# Patient Record
Sex: Male | Born: 1955
Health system: Southern US, Community
[De-identification: ages and names within clinical notes are randomized; demographics above are authoritative.]

## PROBLEM LIST (undated history)

## (undated) DIAGNOSIS — F329 Major depressive disorder, single episode, unspecified: Secondary | ICD-10-CM

## (undated) DIAGNOSIS — Z5189 Encounter for other specified aftercare: Secondary | ICD-10-CM

## (undated) DIAGNOSIS — D649 Anemia, unspecified: Secondary | ICD-10-CM

## (undated) DIAGNOSIS — E785 Hyperlipidemia, unspecified: Secondary | ICD-10-CM

## (undated) DIAGNOSIS — K219 Gastro-esophageal reflux disease without esophagitis: Secondary | ICD-10-CM

## (undated) DIAGNOSIS — I700 Atherosclerosis of aorta: Secondary | ICD-10-CM

## (undated) DIAGNOSIS — F32A Depression, unspecified: Secondary | ICD-10-CM

## (undated) DIAGNOSIS — Z789 Other specified health status: Secondary | ICD-10-CM

## (undated) DIAGNOSIS — F419 Anxiety disorder, unspecified: Secondary | ICD-10-CM

## (undated) DIAGNOSIS — J449 Chronic obstructive pulmonary disease, unspecified: Secondary | ICD-10-CM

## (undated) HISTORY — DX: Depression, unspecified: F32.A

## (undated) HISTORY — DX: Anxiety disorder, unspecified: F41.9

## (undated) HISTORY — DX: Major depressive disorder, single episode, unspecified: F32.9

## (undated) HISTORY — DX: Atherosclerosis of aorta: I70.0

## (undated) HISTORY — DX: Encounter for other specified aftercare: Z51.89

## (undated) HISTORY — DX: Anemia, unspecified: D64.9

## (undated) HISTORY — PX: LEG SURGERY: SHX1003

---

## 1998-08-23 ENCOUNTER — Encounter: Payer: Self-pay | Admitting: Emergency Medicine

## 1998-08-23 ENCOUNTER — Emergency Department (HOSPITAL_COMMUNITY): Admission: EM | Admit: 1998-08-23 | Discharge: 1998-08-23 | Payer: Self-pay | Admitting: Emergency Medicine

## 2000-12-08 ENCOUNTER — Emergency Department (HOSPITAL_COMMUNITY): Admission: EM | Admit: 2000-12-08 | Discharge: 2000-12-08 | Payer: Self-pay | Admitting: Emergency Medicine

## 2003-01-07 ENCOUNTER — Encounter: Payer: Self-pay | Admitting: Cardiology

## 2003-01-07 ENCOUNTER — Encounter: Payer: Self-pay | Admitting: Emergency Medicine

## 2003-01-07 ENCOUNTER — Inpatient Hospital Stay (HOSPITAL_COMMUNITY): Admission: EM | Admit: 2003-01-07 | Discharge: 2003-01-11 | Payer: Self-pay | Admitting: Emergency Medicine

## 2003-04-16 ENCOUNTER — Emergency Department (HOSPITAL_COMMUNITY): Admission: EM | Admit: 2003-04-16 | Discharge: 2003-04-16 | Payer: Self-pay | Admitting: Emergency Medicine

## 2004-01-17 ENCOUNTER — Emergency Department (HOSPITAL_COMMUNITY): Admission: EM | Admit: 2004-01-17 | Discharge: 2004-01-18 | Payer: Self-pay | Admitting: Emergency Medicine

## 2004-07-17 ENCOUNTER — Emergency Department (HOSPITAL_COMMUNITY): Admission: EM | Admit: 2004-07-17 | Discharge: 2004-07-17 | Payer: Self-pay | Admitting: Emergency Medicine

## 2009-05-08 ENCOUNTER — Emergency Department (HOSPITAL_COMMUNITY): Admission: EM | Admit: 2009-05-08 | Discharge: 2009-05-08 | Payer: Self-pay | Admitting: Family Medicine

## 2009-09-23 DIAGNOSIS — Z789 Other specified health status: Secondary | ICD-10-CM

## 2009-09-23 HISTORY — PX: ORIF PROXIMAL TIBIAL PLATEAU FRACTURE: SUR953

## 2009-09-23 HISTORY — DX: Other specified health status: Z78.9

## 2009-09-23 HISTORY — PX: WRIST SURGERY: SHX841

## 2010-01-17 ENCOUNTER — Encounter: Payer: Self-pay | Admitting: Emergency Medicine

## 2010-01-17 ENCOUNTER — Inpatient Hospital Stay (HOSPITAL_COMMUNITY): Admission: EM | Admit: 2010-01-17 | Discharge: 2010-01-30 | Payer: Self-pay | Admitting: Emergency Medicine

## 2010-01-17 ENCOUNTER — Ambulatory Visit: Payer: Self-pay | Admitting: Diagnostic Radiology

## 2010-01-17 DIAGNOSIS — S82109A Unspecified fracture of upper end of unspecified tibia, initial encounter for closed fracture: Secondary | ICD-10-CM | POA: Insufficient documentation

## 2010-01-17 DIAGNOSIS — S5290XA Unspecified fracture of unspecified forearm, initial encounter for closed fracture: Secondary | ICD-10-CM | POA: Insufficient documentation
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
## 2010-02-06 ENCOUNTER — Inpatient Hospital Stay (HOSPITAL_COMMUNITY): Admission: RE | Admit: 2010-02-06 | Discharge: 2010-02-08 | Payer: Self-pay | Admitting: Orthopedic Surgery

## 2010-02-06 IMAGING — CR DG KNEE 1-2V*L*
2 series · 2 of 2 positions shown · non-contrast
Comparison: [DATE]

CLINICAL DATA: Postoperative fixation of left tibial plateau
fracture.

LEFT KNEE - 1-2 VIEW

[view not recorded (1 of 2)]
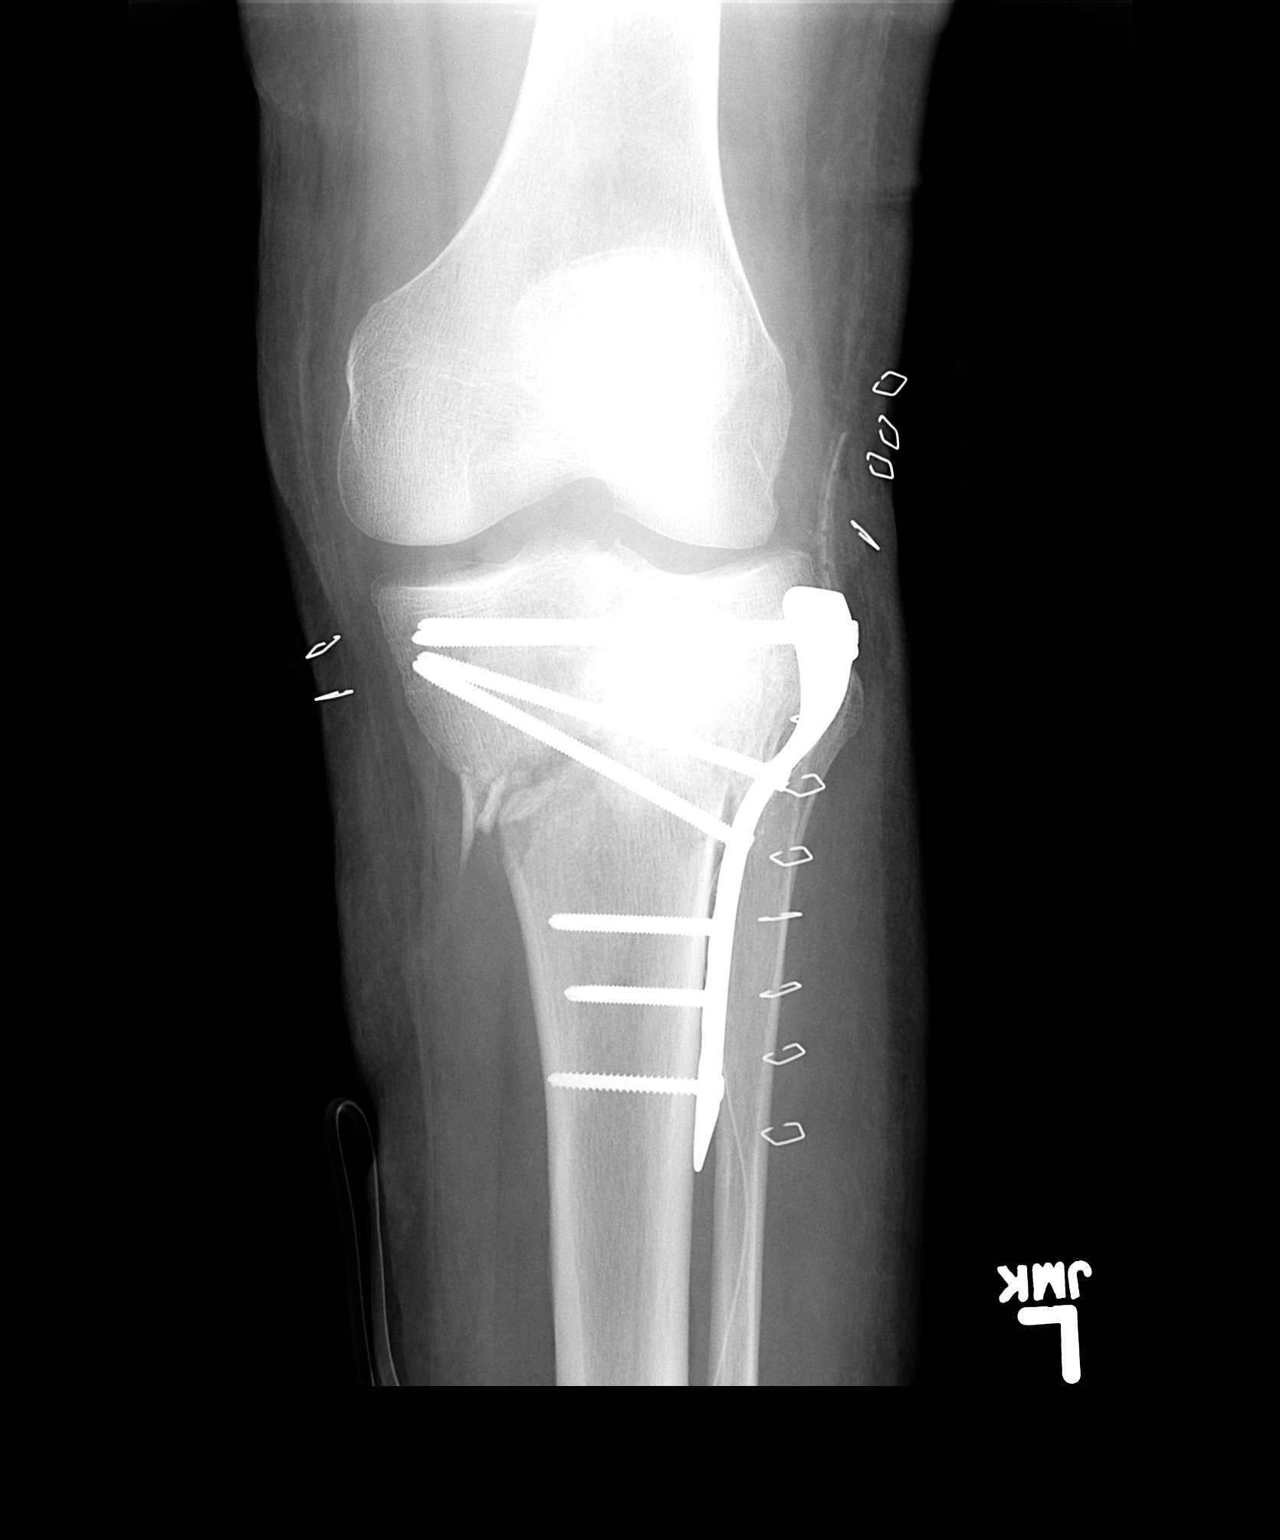

[view not recorded (2 of 2)]
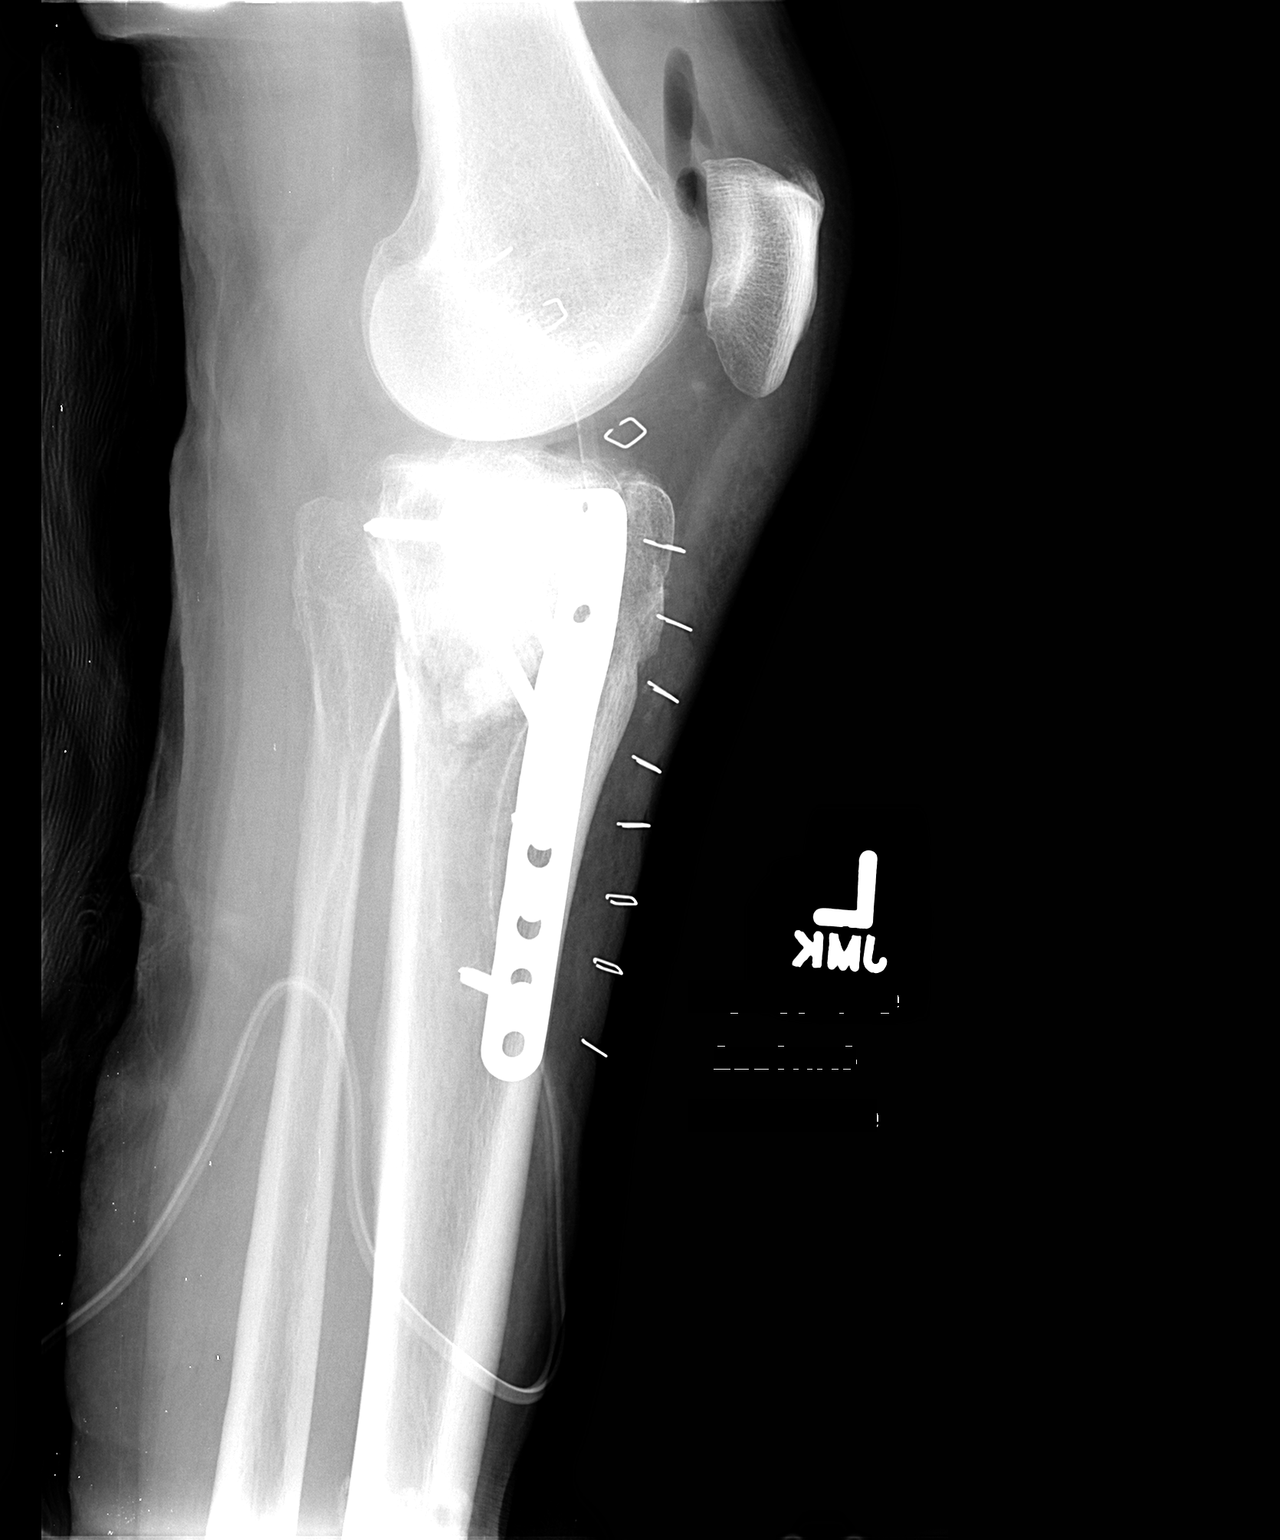

[2 of 2 positions shown; findings below may reference images not displayed]

FINDINGS: Plate screw fixation traversing a lateral tibial plateau
fracture is identified in near anatomic alignment and position.
Small fracture fragments along the medial tibial metaphysis are
unchanged.
A drain and surgical staples are noted.
No definite complicating features are identified.
IMPRESSION: Internal fixation of proximal tibial fracture, in near anatomic
alignment and position.

## 2010-02-06 IMAGING — RF DG KNEE 1-2V*L*
1 series · 2 of 2 positions shown · non-contrast
Comparison: [DATE]

CLINICAL DATA: Tibial plateau fracture

LEFT KNEE - 1-2 VIEW

[Series 1: run · 2 of 2 slices shown]
[im 1/2]
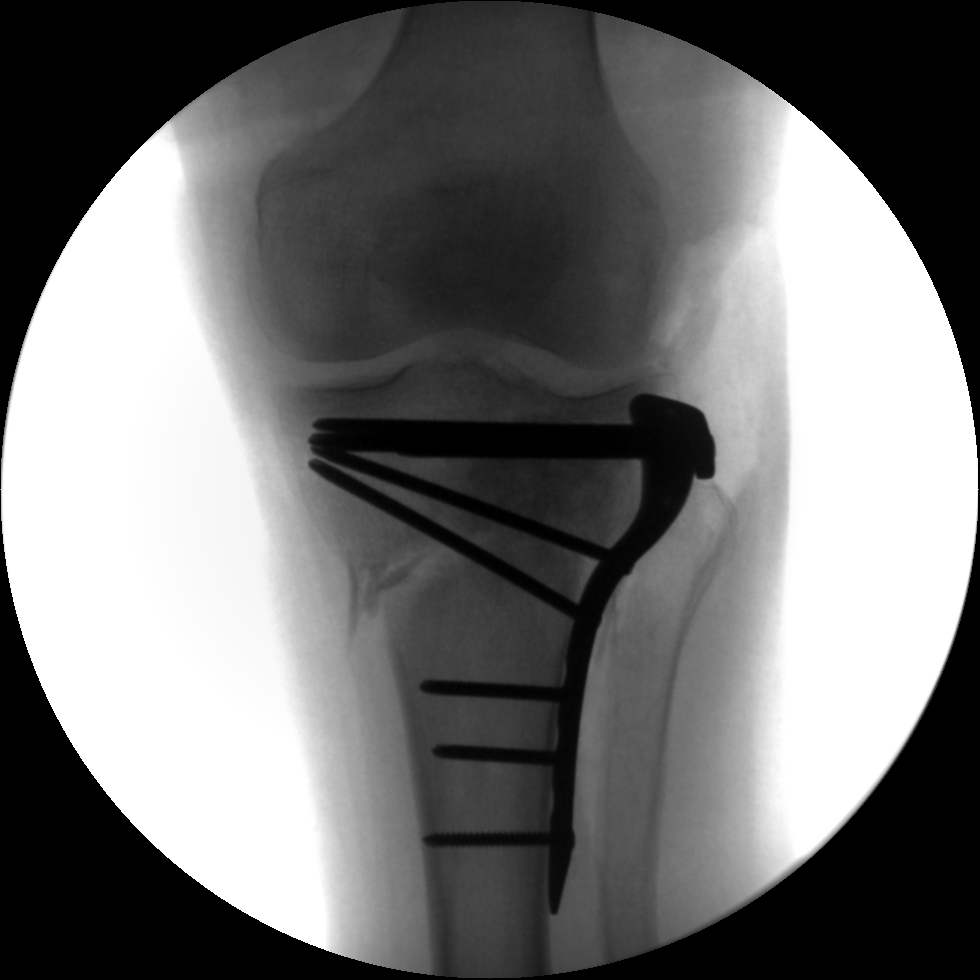
[im 2/2]
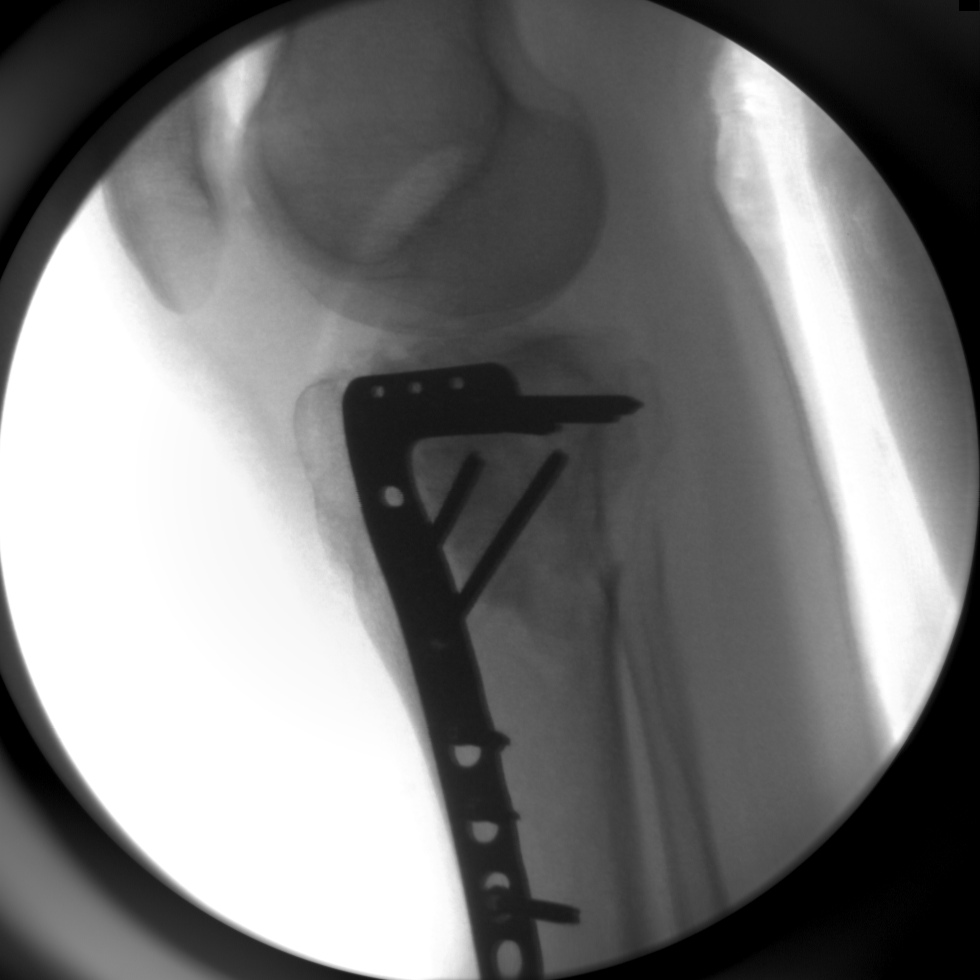

[2 of 2 positions shown; findings below may reference images not displayed]

FINDINGS: Open reduction internal fixation tibial plateau fracture.
Satisfactory position and alignment with plate and screw device.
IMPRESSION: As above

## 2010-02-07 ENCOUNTER — Ambulatory Visit: Payer: Self-pay | Admitting: Physical Medicine & Rehabilitation
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
## 2010-02-08 ENCOUNTER — Encounter (INDEPENDENT_AMBULATORY_CARE_PROVIDER_SITE_OTHER): Payer: Self-pay | Admitting: Nurse Practitioner

## 2010-02-08 ENCOUNTER — Inpatient Hospital Stay (HOSPITAL_COMMUNITY)
Admission: RE | Admit: 2010-02-08 | Discharge: 2010-02-20 | Payer: Self-pay | Admitting: Physical Medicine & Rehabilitation
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
## 2010-02-20 ENCOUNTER — Encounter (INDEPENDENT_AMBULATORY_CARE_PROVIDER_SITE_OTHER): Payer: Self-pay | Admitting: Nurse Practitioner

## 2010-03-12 ENCOUNTER — Ambulatory Visit: Payer: Self-pay | Admitting: Family Medicine

## 2010-03-12 ENCOUNTER — Encounter (INDEPENDENT_AMBULATORY_CARE_PROVIDER_SITE_OTHER): Payer: Self-pay | Admitting: Nurse Practitioner

## 2010-03-12 DIAGNOSIS — D649 Anemia, unspecified: Secondary | ICD-10-CM | POA: Insufficient documentation

## 2010-03-12 LAB — CONVERTED CEMR LAB: Prothrombin Time: 26.6 s — ABNORMAL HIGH (ref 11.6–15.2)

## 2010-03-13 ENCOUNTER — Encounter: Payer: Self-pay | Admitting: Nurse Practitioner

## 2010-03-13 ENCOUNTER — Telehealth (INDEPENDENT_AMBULATORY_CARE_PROVIDER_SITE_OTHER): Payer: Self-pay | Admitting: Nurse Practitioner

## 2010-04-04 ENCOUNTER — Encounter: Admission: RE | Admit: 2010-04-04 | Discharge: 2010-06-15 | Payer: Self-pay | Admitting: Orthopedic Surgery

## 2010-04-04 ENCOUNTER — Telehealth (INDEPENDENT_AMBULATORY_CARE_PROVIDER_SITE_OTHER): Payer: Self-pay | Admitting: Nurse Practitioner

## 2010-04-20 ENCOUNTER — Ambulatory Visit: Payer: Self-pay | Admitting: Nurse Practitioner

## 2010-04-20 DIAGNOSIS — F32A Depression, unspecified: Secondary | ICD-10-CM | POA: Insufficient documentation

## 2010-04-20 DIAGNOSIS — F329 Major depressive disorder, single episode, unspecified: Secondary | ICD-10-CM

## 2010-06-16 ENCOUNTER — Emergency Department (HOSPITAL_COMMUNITY): Admission: EM | Admit: 2010-06-16 | Discharge: 2010-06-17 | Payer: Self-pay | Admitting: Emergency Medicine

## 2010-10-22 ENCOUNTER — Emergency Department (HOSPITAL_COMMUNITY)
Admission: EM | Admit: 2010-10-22 | Discharge: 2010-10-22 | Payer: Self-pay | Source: Home / Self Care | Admitting: Emergency Medicine
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
## 2010-10-23 NOTE — Letter (Signed)
Summary: INPATIENT REHAB CASE MANAGEMENT  INPATIENT REHAB CASE MANAGEMENT   Imported By: Arta Bruce 03/13/2010 09:22:17  _____________________________________________________________________  External Attachment:    Type:   Image     Comment:   External Document

## 2010-10-23 NOTE — Assessment & Plan Note (Signed)
Summary: 1 month F/u   Vital Signs:  Patient profile:   55 year old male Height:      69 inches Weight:      144.7 pounds BMI:     21.45 Temp:     97.2 degrees F oral Pulse rate:   80 / minute Pulse rhythm:   regular Resp:     20 per minute BP sitting:   110 / 70  (left arm) Cuff size:   regular  Vitals Entered By: Levon Hedger (April 20, 2010 11:56 AM) CC: follow-up visit, Depression Is Patient Diabetic? No Pain Assessment Patient in pain? no       Does patient need assistance? Functional Status Self care Ambulation Normal Comments pt stopped taking coumadin x 1 week he states it make him sick   CC:  follow-up visit and Depression.  History of Present Illness:  Pt into the office for f/u He has been back to ortho since his last visit in this office. Physical therapy - 2 days per week.  Pt is up and moving around, slow.  He is using walker and wheelchair as assistive devices. Wrist - splints removed from hands. Pt is pleased wit his progress. Next ortho appointment is August 17th.  Coumadin stopped 1 week ago. Pt reports that he started vomiting (not hemoptysis) and he attributed it to the coumadin. Pt stopped taking the medication on his own advise. Pt is NOT interested in restarting the medication as he reports he was only taking it because he was not able to move around and was only taking it for prevention.  pt is taking pain meds very sparingly.  Original Rx given s/p hospitalization.  No acute complaints today   Depression History:      The patient denies a depressed mood most of the day and a diminished interest in his usual daily activities.        The patient denies that he feels like life is not worth living, denies that he wishes that he were dead, and denies that he has thought about ending his life.        Comments:  Pt reports that when he was not ambulating his mood was down. but now that he has started physical therapy and is ambulating he is  feeling better.  Depression Treatment History:  Prior Medication Used:   Start Date: Assessment of Effect:   Comments:  Zoloft (sertraline)     04/20/2010   no depressive symptoms   --   Allergies (verified): No Known Drug Allergies  Review of Systems General:  Denies fever. CV:  Denies chest pain or discomfort. Resp:  Denies cough. GI:  Denies abdominal pain, nausea, and vomiting.  Physical Exam  General:  alert.   Eyes:  glasses Mouth:  good dentition.   Lungs:  normal breath sounds.   Heart:  normal rate and regular rhythm.   Msk:  wheelchair but able to stand for weight  Neurologic:  alert & oriented X3.     Impression & Recommendations:  Problem # 1:  ANEMIA (ICD-285.9) labs reviewed with pt will recheck on next visit  Problem # 2:  FRACTURE, RADIUS (ICD-813.81) pt to maintain f/u with hand specialist  Problem # 3:  FRACTURE, TIBIAL PLATEAU (ICD-823.00) pt advised to f/u with ortho as scheduled continue physical therapy  Problem # 4:  DEPRESSION, MILD (ICD-311) pt is feeling better now that he has started physical therapy and is ambulating advised pt that sertraline is  not as needed he needs to take nightly for relief of symptoms His updated medication list for this problem includes:    Sertraline Hcl 50 Mg Tabs (Sertraline hcl) ..... One tablet by mouth nightly for anxiety  Complete Medication List: 1)  Vitamin C 500 Mg Chew (Ascorbic acid) .... One tablet by mouth daily 2)  Robaxin 500 Mg Tabs (Methocarbamol) .... Every six hours as needed for spasms 3)  Oxycodone Hcl 5 Mg Tabs (Oxycodone hcl) .... One or two tabs every four hours as needed for pain 4)  Multivitamins Tabs (Multiple vitamin) .... One tablet by mouth daily 5)  Vicodin 5-500 Mg Tabs (Hydrocodone-acetaminophen) .... One tablet by mouth two times a day as needed for pain 6)  Sertraline Hcl 50 Mg Tabs (Sertraline hcl) .... One tablet by mouth nightly for anxiety  Patient Instructions: 1)  You  should take the medication for your mood nightly. 2)  Keep your appointment with ortho on August 17th as scheduled. 3)  Keep your appointment for eligibility 4)  Follow up in this office in 2 months with n.martin,fnp. 5)  Will need cbc.  Prevention & Chronic Care Immunizations   Influenza vaccine: Not documented    Tetanus booster: Not documented    Pneumococcal vaccine: Not documented  Colorectal Screening   Hemoccult: Not documented    Colonoscopy: Not documented  Other Screening   PSA: Not documented   Smoking status: current  (03/12/2010)  Lipids   Total Cholesterol: Not documented   LDL: Not documented   LDL Direct: Not documented   HDL: Not documented   Triglycerides: Not documented
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
## 2010-10-23 NOTE — Letter (Signed)
Summary: [facility] /PT MEDICATION DISCHARGE INSTRUCTIONS   /PT MEDICATION DISCHARGE INSTRUCTIONS   Imported By: Arta Bruce 03/05/2010 09:11:41  _____________________________________________________________________  External Attachment:    Type:   Image     Comment:   External Document

## 2010-10-23 NOTE — Progress Notes (Signed)
Summary: Lab results  Phone Note Outgoing Call   Summary of Call: notify pt that INR is 2.4 (goal between 2-3) keep coumadin at current dose will recheck at next month during visit does he need a refill of coumadin called into a pharmacy so that the will have enough until next visit Initial call taken by: Lehman Prom FNP,  March 13, 2010 8:40 AM  Follow-up for Phone Call        Left message on answer machine for pt. to return call Gaylyn Cheers RN  March 13, 2010 9:41 AM  Pt. returned call instructions given, will need refill on Coumadin takes 5 mg 1 1/2 tab M-W-F and 1 tab Tues-Th-Sat-Sun. [facility].  Follow-up by: Gaylyn Cheers RN,  March 13, 2010 10:12 AM  Additional Follow-up for Phone Call Additional follow up Details #1::        Rx printed and in basket there are two [facility] in [place] - need clarification on which pt wants Rx sent to fax Rx there once clarification obtained  [address] [facility] will fax Gaylyn Cheers RN  March 13, 2010 1:33 PM  Additional Follow-up by: Lehman Prom FNP,  March 13, 2010 10:39 AM    New/Updated Medications: COUMADIN 5 MG TABS (WARFARIN SODIUM) Sunday - 1 tab, Monday - 1.5 tabs, Tuesday - 1 tab, Wednesday - 1.5 tabs, Thursday - 1 tab, Friday - 1.5 tabs, Saturday - 1 tab Prescriptions: COUMADIN 5 MG TABS (WARFARIN SODIUM) Sunday - 1 tab, Monday - 1.5 tabs, Tuesday - 1 tab, Wednesday - 1.5 tabs, Thursday - 1 tab, Friday - 1.5 tabs, Saturday - 1 tab  #1 month qs x 1   Entered and Authorized by:   Lehman Prom FNP   Signed by:   Lehman Prom FNP on 03/13/2010   Method used:   Printed then faxed to ...         RxID:   [id number]   Anticoagulation Management History:      Negative risk factors for bleeding include an age less than 39 years old.  The bleeding index is 'low risk'.  Negative CHADS2 values include Age > 12 years old.  His last INR was 2.48.    Anticoagulation Management Assessment/Plan:  The patient's current anticoagulation dose is Coumadin 5 mg tabs: one and a half (11/2)pill on mon, wed, and friday and (1)one pill on tues., thurs., sat., and sun, Coumadin 5 mg tabs: Sunday - 1 tab, Monday - 1.5 tabs, Tuesday - 1 tab, Wednesday - 1.5 tabs, Thursday - 1 tab, Friday - 1.5 tabs, Saturday - 1 tab.         Current Anticoagulation Instructions: The patient is to continue with the same dose of coumadin.  This dosage includes:  Coumadin 5 mg tabs: one and a half (11/2)pill on mon, wed, and friday and (1)one pill on tues., thurs., sat., and sun

## 2010-12-10 LAB — BASIC METABOLIC PANEL
BUN: 10 mg/dL (ref 6–23)
CO2: 28 mEq/L (ref 19–32)
Calcium: 8.7 mg/dL (ref 8.4–10.5)
Chloride: 104 mEq/L (ref 96–112)
Creatinine, Ser: 0.65 mg/dL (ref 0.4–1.5)
Creatinine, Ser: 0.72 mg/dL (ref 0.4–1.5)
GFR calc Af Amer: >60 mL/min (ref >60)
GFR calc non Af Amer: >60 mL/min (ref >60)
Glucose, Bld: 107 mg/dL — ABNORMAL HIGH (ref 70–99)
Glucose, Bld: 115 mg/dL — ABNORMAL HIGH (ref 70–99)
Potassium: 4 mEq/L (ref 3.5–5.1)
Potassium: 4.3 mEq/L (ref 3.5–5.1)
Sodium: 135 mEq/L (ref 135–145)

## 2010-12-10 LAB — PROTIME-INR
INR: 1.01 (ref 0.00–1.49)
INR: 1.7 — ABNORMAL HIGH (ref 0.00–1.49)
INR: 1.73 — ABNORMAL HIGH (ref 0.00–1.49)
INR: 1.93 — ABNORMAL HIGH (ref 0.00–1.49)
INR: 2.24 — ABNORMAL HIGH (ref 0.00–1.49)
Prothrombin Time: 19.8 seconds — ABNORMAL HIGH (ref 11.6–15.2)
Prothrombin Time: 20.1 seconds — ABNORMAL HIGH (ref 11.6–15.2)
Prothrombin Time: 21.9 seconds — ABNORMAL HIGH (ref 11.6–15.2)
Prothrombin Time: 23.2 seconds — ABNORMAL HIGH (ref 11.6–15.2)
Prothrombin Time: 24.4 seconds — ABNORMAL HIGH (ref 11.6–15.2)
Prothrombin Time: 24.6 seconds — ABNORMAL HIGH (ref 11.6–15.2)

## 2010-12-10 LAB — DIFFERENTIAL
Basophils Absolute: 0 10*3/uL (ref 0.0–0.1)
Lymphocytes Relative: 19 % (ref 12–46)
Lymphs Abs: 2.1 10*3/uL (ref 0.7–4.0)
Neutro Abs: 7.6 10*3/uL (ref 1.7–7.7)
Neutrophils Relative %: 71 % (ref 43–77)

## 2010-12-10 LAB — CBC
HCT: 27.1 % — ABNORMAL LOW (ref 39.0–52.0)
HCT: 28.1 % — ABNORMAL LOW (ref 39.0–52.0)
Hemoglobin: 10 g/dL — ABNORMAL LOW (ref 13.0–17.0)
Hemoglobin: 11 g/dL — ABNORMAL LOW (ref 13.0–17.0)
Hemoglobin: 9.1 g/dL — ABNORMAL LOW (ref 13.0–17.0)
MCHC: 34.1 g/dL (ref 30.0–36.0)
MCHC: 34.1 g/dL (ref 30.0–36.0)
MCHC: 34.8 g/dL (ref 30.0–36.0)
MCV: 95.1 fL (ref 78.0–100.0)
MCV: 95.8 fL (ref 78.0–100.0)
MCV: 96.7 fL (ref 78.0–100.0)
Platelets: 375 10*3/uL (ref 150–400)
RBC: 2.95 MIL/uL — ABNORMAL LOW (ref 4.22–5.81)
RBC: 3.06 MIL/uL — ABNORMAL LOW (ref 4.22–5.81)
RDW: 14.7 % (ref 11.5–15.5)
RDW: 15 % (ref 11.5–15.5)
WBC: 7.2 10*3/uL (ref 4.0–10.5)
WBC: 9.5 10*3/uL (ref 4.0–10.5)

## 2010-12-10 LAB — COMPREHENSIVE METABOLIC PANEL
AST: 13 U/L (ref 0–37)
BUN: 11 mg/dL (ref 6–23)
CO2: 29 mEq/L (ref 19–32)
Calcium: 8.3 mg/dL — ABNORMAL LOW (ref 8.4–10.5)
Creatinine, Ser: 0.67 mg/dL (ref 0.4–1.5)
GFR calc Af Amer: >60 mL/min (ref >60)
GFR calc non Af Amer: >60 mL/min (ref >60)
Glucose, Bld: 117 mg/dL — ABNORMAL HIGH (ref 70–99)
Total Bilirubin: 0.8 mg/dL (ref 0.3–1.2)

## 2010-12-10 LAB — APTT: aPTT: 27 seconds (ref 24–37)

## 2010-12-11 LAB — CBC
HCT: 30.7 % — ABNORMAL LOW (ref 39.0–52.0)
HCT: 42.5 % (ref 39.0–52.0)
Hemoglobin: 9.9 g/dL — ABNORMAL LOW (ref 13.0–17.0)
Hemoglobin: 11 g/dL — ABNORMAL LOW (ref 13.0–17.0)
Hemoglobin: 11.9 g/dL — ABNORMAL LOW (ref 13.0–17.0)
Hemoglobin: 14.7 g/dL (ref 13.0–17.0)
MCHC: 35.3 g/dL (ref 30.0–36.0)
MCHC: 35.7 g/dL (ref 30.0–36.0)
MCV: 94.3 fL (ref 78.0–100.0)
MCV: 92.7 fL (ref 78.0–100.0)
Platelets: 214 10*3/uL (ref 150–400)
RBC: 3.26 MIL/uL — ABNORMAL LOW (ref 4.22–5.81)
RBC: 3.54 MIL/uL — ABNORMAL LOW (ref 4.22–5.81)
RDW: 13.8 % (ref 11.5–15.5)
RDW: 13.4 % (ref 11.5–15.5)
RDW: 12.9 % (ref 11.5–15.5)
WBC: 11.5 10*3/uL — ABNORMAL HIGH (ref 4.0–10.5)

## 2010-12-11 LAB — COMPREHENSIVE METABOLIC PANEL
ALT: 17 U/L (ref 0–53)
Albumin: 3.1 g/dL — ABNORMAL LOW (ref 3.5–5.2)
Alkaline Phosphatase: 42 U/L (ref 39–117)
Alkaline Phosphatase: 46 U/L (ref 39–117)
BUN: 13 mg/dL (ref 6–23)
BUN: 21 mg/dL (ref 6–23)
Calcium: 8.2 mg/dL — ABNORMAL LOW (ref 8.4–10.5)
Creatinine, Ser: 0.8 mg/dL (ref 0.4–1.5)
Glucose, Bld: 108 mg/dL — ABNORMAL HIGH (ref 70–99)
Glucose, Bld: 111 mg/dL — ABNORMAL HIGH (ref 70–99)
Potassium: 4.2 mEq/L (ref 3.5–5.1)
Potassium: 4.6 mEq/L (ref 3.5–5.1)
Sodium: 132 mEq/L — ABNORMAL LOW (ref 135–145)
Total Bilirubin: 1.3 mg/dL — ABNORMAL HIGH (ref 0.3–1.2)
Total Protein: 5.9 g/dL — ABNORMAL LOW (ref 6.0–8.3)
Total Protein: 7.2 g/dL (ref 6.0–8.3)

## 2010-12-11 LAB — BASIC METABOLIC PANEL
CO2: 29 mEq/L (ref 19–32)
Calcium: 8.2 mg/dL — ABNORMAL LOW (ref 8.4–10.5)
Calcium: 8.2 mg/dL — ABNORMAL LOW (ref 8.4–10.5)
Chloride: 102 mEq/L (ref 96–112)
Creatinine, Ser: 0.84 mg/dL (ref 0.4–1.5)
GFR calc Af Amer: >60 mL/min (ref >60)
GFR calc Af Amer: >60 mL/min (ref >60)
GFR calc non Af Amer: >60 mL/min (ref >60)
Glucose, Bld: 141 mg/dL — ABNORMAL HIGH (ref 70–99)
Potassium: 4.2 mEq/L (ref 3.5–5.1)
Sodium: 135 mEq/L (ref 135–145)
Sodium: 136 mEq/L (ref 135–145)

## 2010-12-11 LAB — DIFFERENTIAL
Basophils Absolute: 0.2 10*3/uL — ABNORMAL HIGH (ref 0.0–0.1)
Basophils Relative: 2 % — ABNORMAL HIGH (ref 0–1)
Lymphocytes Relative: 27 % (ref 12–46)
Monocytes Relative: 6 % (ref 3–12)
Neutro Abs: 6.4 10*3/uL (ref 1.7–7.7)
Neutrophils Relative %: 64 % (ref 43–77)

## 2010-12-11 LAB — CATH TIP CULTURE

## 2010-12-11 LAB — PROTIME-INR: INR: 0.93 (ref 0.00–1.49)

## 2010-12-11 LAB — ETHANOL: Alcohol, Ethyl (B): <10 mg/dL (ref 0–10)

## 2011-02-08 NOTE — Discharge Summary (Signed)
NAME:  Taylor Ochoa, Taylor Ochoa                          ACCOUNT NO.:  [id number]   MEDICAL RECORD NO.:  [id number]                   PATIENT TYPE:  INP   LOCATION:  2004                                 FACILITY:  [facility]   PHYSICIAN:  Willa Rough, M.D. [facility]              DATE OF BIRTH:  [birth year]   DATE OF ADMISSION:  01/06/2003  DATE OF DISCHARGE:  01/11/2003                           DISCHARGE SUMMARY - REFERRING   PROCEDURES:  1. Cardiac catheterization.  2. Coronary arteriogram.  3. Left ventriculogram.  4. A 2-D echocardiogram.   HOSPITAL COURSE:  The patient is a 55 year old male with no known history of  coronary artery disease.  He awoke on the morning of January 06, 2003 with  chest pain which persisted all day and was worse with the supine position.  He came to the emergency room and his EKG was evaluated and showed diffuse  ST segment elevation consistent with pericarditis.  He was admitted for  further evaluation and treatment.   His CK-MB's peaked at 184/18.4 with a troponin-I peak of 2.45.  He also had  a total cholesterol profile which showed a total cholesterol of 162,  triglycerides 83, HDL 44, LDL 101.  His symptoms resolved with good pain  control but because of the elevated CK-MB he was scheduled for a cardiac  catheterization.  The cardiac catheterization showed an EF of 65% with no  wall motion abnormalities.  He had no AS or MR.  His left ventricular size  and systolic function were normal and his coronary arteries were normal.  His RCA was very small and nondominant but had no significant stenosis.  No  other vessel had any significant stenosis.   As part of his evaluation for possible pericarditis, he had an  echocardiogram performed that showed an EF of 50%-55% with no wall motion  abnormalities.  There was no pericardial effusion and no significant  valvular abnormalities.   The patient's pain was under good control with Toradol and later he was  changed to  ibuprofen.  He was pain free on ibuprofen 400 mg p.o. t.i.d.  He  had also been started on a beta-blocker because of the possibility of  coronary artery disease but he became significantly hypotensive with this  and this was discontinued.  The patient was evaluated by Dr. Myrtis Ser on January 11, 2003 and considered stable for discharge with outpatient followup  arranged.   LABORATORY VALUES:  Hemoglobin 14, hematocrit 41.2, WBC 12.1, platelets 263.  Sodium 141, potassium 3.7, chloride 107, CO2 28, BUN 20, creatinine 0.9,  glucose 110.   Chest x-ray:  Cardiomegaly.  Mediastinal contours and vascularity are  normal.  Bibasilar atelectasis with the remaining lung clear.   DISCHARGE CONDITION:  Improved.   DISCHARGE DIAGNOSES:  1. Pericarditis.  2. Status post cardiac catheterization this admission with normal coronaries     and normal left ventricular function.  3. History of tobacco use.  4. Hypotension secondary to beta-blockers.   DISCHARGE INSTRUCTIONS:  1. His activity level is to include no driving, sexual or strenuous activity     for two days.  2. He is to call the office for problems with the catheterization site.  3. He is to follow up with Dr. Myrtis Ser on May 10.  He is to obtain a primary     care physician.   DISCHARGE MEDICATIONS:  Ibuprofen 400 mg p.o. t.i.d. with meals for 10 days.     Lavella Hammock, P.A. [facility]                  Willa Rough, M.D. [facility]    RG/MEDQ  D:  01/11/2003  T:  01/11/2003  Job:  [id number]

## 2011-02-08 NOTE — H&P (Signed)
NAME:  Taylor Ochoa, Taylor Ochoa NO.:  [id number]   MEDICAL RECORD NO.:  [id number]                   PATIENT TYPE:  EMS   LOCATION:  [facility]                                 FACILITY:  [facility]   PHYSICIAN:  Willa Rough, M.D. [facility]              DATE OF BIRTH:  1955/11/14   DATE OF ADMISSION:  01/06/2003  DATE OF DISCHARGE:                                HISTORY & PHYSICAL   Taylor Ochoa awoke on the morning of 01/06/03 with chest pain.  The pain  persisted all day and it became worse when he was trying to lie down.  He  then came to the [facility] emergency room.  His EKG was compatible with  pericarditis and we are seeing him to help with his care.   PAST MEDICAL HISTORY:   ALLERGIES:  None known.   MEDICATIONS:  No medications.   OTHER MEDICAL PROBLEMS:  See the complete list below.   SOCIAL HISTORY:  The patient has worked for many, many years with [employer].  He has long hours in the range of 65 hours a week.  He smokes 1.5  packs of cigarettes per day and he drinks social alcohol and does not take  any other drugs.   FAMILY HISTORY:  There is no documented significant family history of  coronary disease.   REVIEW OF SYSTEMS:  GI/GU: The patient has not been having any significant  GI or GU symptoms.  HEENT: He has had no major HEENT problems.  The  remainder of his review of systems is negative.   PHYSICAL EXAMINATION:  VITAL SIGNS: Temperature is 98.7.  Blood pressure is  120/84 with a pulse of 95.  Respirations are 20.  LUNGS: Reveal some scattered rhonchi.  NECK: Normal.  HEENT: Reveals that he is wearing glasses.  CARDIAC EXAM: Reveals an S1 with an S2.  I cannot hear a definite rub at  this time.  ABDOMEN: Benign.  There is no peripheral edema.  MUSCULOSKELETAL: There are no musculoskeletal deformities.  NEUROLOGIC: Is grossly intact.   STUDIES:  EKG reveals J point elevation in leads 1, 2, 3, aVF in V2-V6.  There is also some PR depression  noted.  Chest x-ray was read as mild  cardiomegaly.  The patient's first troponin is normal.   PROBLEMS INCLUDE:  1. Tobacco abuse.  2. Chest pain with EKG changes compatible with pericarditis.    PLAN:  The patient is admitted.  He is being given intravenous Toradol which  is helping him.  A 2-D echocardiogram will be done to rule out a significant  pericardial effusion and we will check a full set of cardiac enzymes to rule  out myocardial infarction.  I am hopeful that he will stabilize and that he  will be able to go home soon on nonsteroidals antiinflammatory drugs.  A PPD  will be placed to be sure  that there is no sign of tuberculosis.                                               Willa Rough, M.D. [facility]    JK/MEDQ  D:  01/07/2003  T:  01/07/2003  Job:  [id number]

## 2011-02-08 NOTE — Cardiovascular Report (Signed)
   NAME:  Taylor Ochoa, Taylor Ochoa                          ACCOUNT NO.:  [id number]   MEDICAL RECORD NO.:  [id number]                   PATIENT TYPE:  INP   LOCATION:  2004                                 FACILITY:  [facility]   PHYSICIAN:  Salvadore Farber, M.D.             DATE OF BIRTH:  Jun 26, 1956   DATE OF PROCEDURE:  01/10/2003  DATE OF DISCHARGE:                              CARDIAC CATHETERIZATION   PROCEDURES:  1. Left heart catheterization.  2. Left ventriculogram.  3. Coronary angiogram.   INDICATIONS:  The patient is a 55 year old gentleman who presented two days  ago with chest discomfort.  Electrocardiogram and symptoms were suggestive  of acute pericarditis.  Troponin rose to 2.5.  Out of concern that this  might instead represent an acute coronary syndrome, he was referred for  diagnostic angiography.   DESCRIPTION OF PROCEDURE:  Informed consent was obtained.  Under 1%  lidocaine local anesthesia, a 6 French sheath was placed in the right  femoral artery using modified Seldinger technique.  Diagnostic angiography  and ventriculography were performed using 6 French JL4, JR4, and pigtail  catheters.  The patient tolerated the procedure well and was transferred to  the holding room in stable condition.   COMPLICATIONS:  None.   FINDINGS:  1. LV:  98/8/15.  2. EF 65% without regional wall motion abnormality.  3. No aortic stenosis or mitral regurgitation.  4. Left main:  Angiographically normal.  5. LAD:  The LAD is a moderate-sized vessel reaching the apex of the heart     and giving rise to two diagonal branches.  It is angiographically normal.  6. Ramus intermedius:  A moderate-sized vessel which is angiographically     normal.  7. Circumflex:  The circumflex is a large, dominant vessel.  It gives rise     to two obtuse marginals and the PDA.  It is angiographically normal.  8. RCA:  The RCA is a small, nondominant vessel.  It is angiographically     normal.   IMPRESSION AND RECOMMENDATIONS:  1. Normal left ventricular size and systolic function.  2. Angiographically normal coronary arteries.  3. No aortic stenosis or mitral regurgitation.   Will plan continued medical therapy for his pericarditis.                                              Salvadore Farber, M.D.     WED/MEDQ  D:  01/10/2003  T:  01/12/2003  Job:  [id number]

## 2011-04-22 ENCOUNTER — Emergency Department (HOSPITAL_COMMUNITY)
Admission: EM | Admit: 2011-04-22 | Discharge: 2011-04-22 | Disposition: A | Discharge status: Home / Self Care | Payer: Managed Care, Other (non HMO) | Attending: Emergency Medicine | Admitting: Emergency Medicine

## 2011-04-22 DIAGNOSIS — M79609 Pain in unspecified limb: Secondary | ICD-10-CM | POA: Insufficient documentation

## 2011-06-10 ENCOUNTER — Emergency Department (HOSPITAL_COMMUNITY): Payer: Managed Care, Other (non HMO)

## 2011-06-10 ENCOUNTER — Emergency Department (HOSPITAL_COMMUNITY)
Admission: EM | Admit: 2011-06-10 | Discharge: 2011-06-10 | Disposition: A | Discharge status: Home / Self Care | Payer: Managed Care, Other (non HMO) | Attending: Emergency Medicine | Admitting: Emergency Medicine

## 2011-06-10 DIAGNOSIS — M79609 Pain in unspecified limb: Secondary | ICD-10-CM | POA: Insufficient documentation

## 2011-06-10 DIAGNOSIS — K219 Gastro-esophageal reflux disease without esophagitis: Secondary | ICD-10-CM | POA: Insufficient documentation

## 2011-06-10 DIAGNOSIS — S0003XA Contusion of scalp, initial encounter: Secondary | ICD-10-CM | POA: Insufficient documentation

## 2011-06-10 DIAGNOSIS — IMO0002 Reserved for concepts with insufficient information to code with codable children: Secondary | ICD-10-CM | POA: Insufficient documentation

## 2011-06-10 LAB — CBC
MCH: 33.7 pg (ref 26.0–34.0)
MCHC: 36.7 g/dL — ABNORMAL HIGH (ref 30.0–36.0)
Platelets: 198 10*3/uL (ref 150–400)
RDW: 13.2 % (ref 11.5–15.5)

## 2011-06-10 LAB — DIFFERENTIAL
Basophils Relative: 0 % (ref 0–1)
Eosinophils Absolute: 0 10*3/uL (ref 0.0–0.7)
Monocytes Absolute: 0.7 10*3/uL (ref 0.1–1.0)
Monocytes Relative: 6 % (ref 3–12)
Neutrophils Relative %: 69 % (ref 43–77)

## 2011-06-10 LAB — COMPREHENSIVE METABOLIC PANEL
Alkaline Phosphatase: 62 U/L (ref 39–117)
BUN: 18 mg/dL (ref 6–23)
Calcium: 9.4 mg/dL (ref 8.4–10.5)
Creatinine, Ser: 0.91 mg/dL (ref 0.50–1.35)
GFR calc non Af Amer: >60 mL/min (ref >60)
Glucose, Bld: 120 mg/dL — ABNORMAL HIGH (ref 70–99)
Total Protein: 7.2 g/dL (ref 6.0–8.3)

## 2011-06-10 LAB — PROTIME-INR: Prothrombin Time: 12.5 seconds (ref 11.6–15.2)
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
## 2011-12-12 ENCOUNTER — Emergency Department (HOSPITAL_COMMUNITY): Payer: Managed Care, Other (non HMO)

## 2011-12-12 ENCOUNTER — Emergency Department (HOSPITAL_COMMUNITY)
Admission: EM | Admit: 2011-12-12 | Discharge: 2011-12-12 | Disposition: A | Discharge status: Home / Self Care | Payer: Managed Care, Other (non HMO) | Attending: Emergency Medicine | Admitting: Emergency Medicine

## 2011-12-12 ENCOUNTER — Encounter (HOSPITAL_COMMUNITY): Payer: Self-pay | Admitting: *Deleted

## 2011-12-12 DIAGNOSIS — F17200 Nicotine dependence, unspecified, uncomplicated: Secondary | ICD-10-CM | POA: Insufficient documentation

## 2011-12-12 DIAGNOSIS — M79606 Pain in leg, unspecified: Secondary | ICD-10-CM

## 2011-12-12 DIAGNOSIS — M79609 Pain in unspecified limb: Secondary | ICD-10-CM | POA: Insufficient documentation

## 2011-12-12 IMAGING — CR DG TIBIA/FIBULA 2V*L*
4 series · 4 of 4 positions shown · non-contrast
Comparison: Two-view knee films [DATE].

CLINICAL DATA: Knot on the lateral tib-fib yesterday.  Previous
surgery.  Leg pain.

LEFT TIBIA AND FIBULA - 2 VIEW

[t tib/fib ap left (1 of 2)]
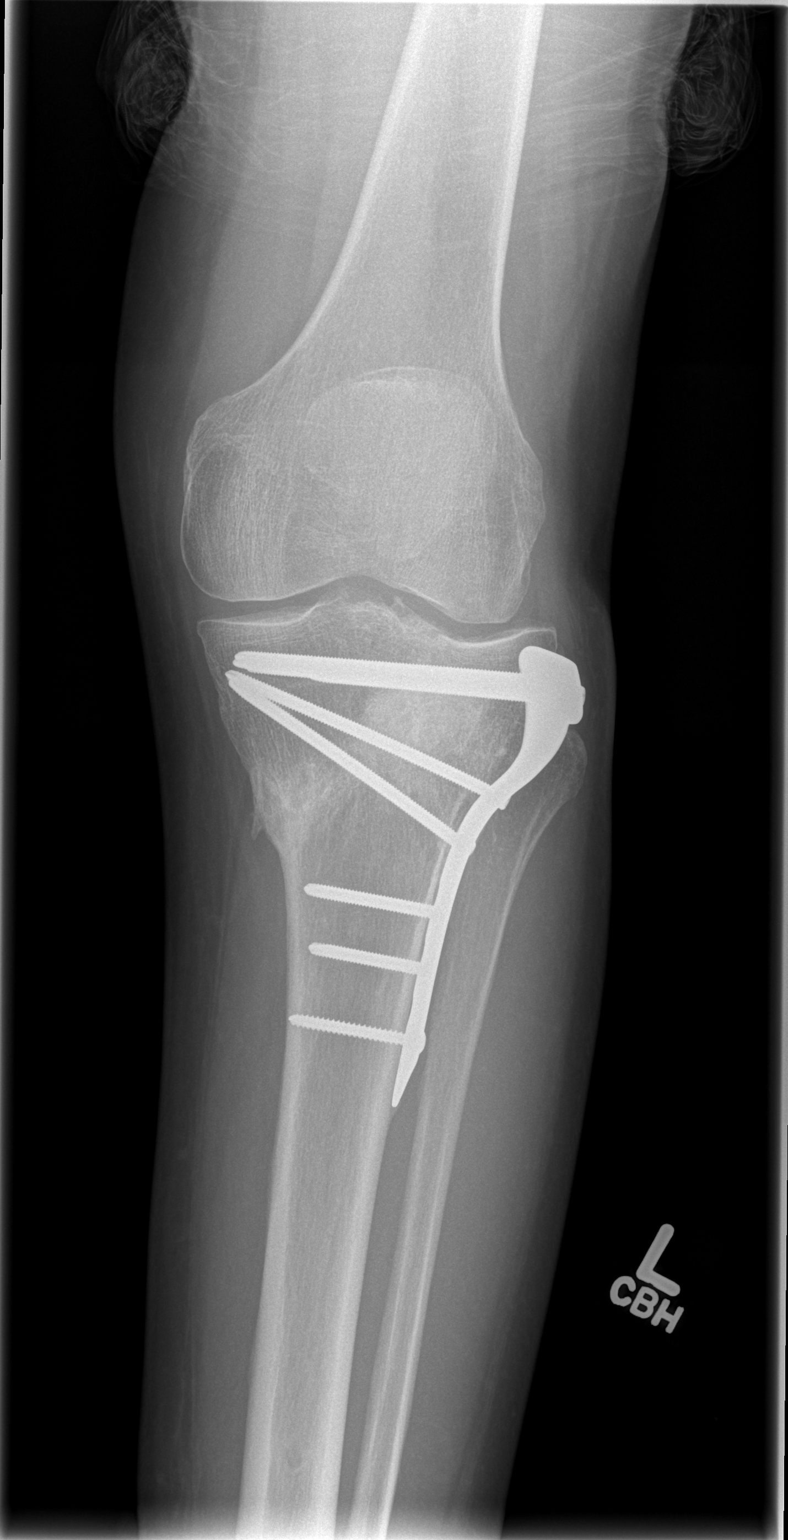

[t tib/fib ap left (2 of 2)]
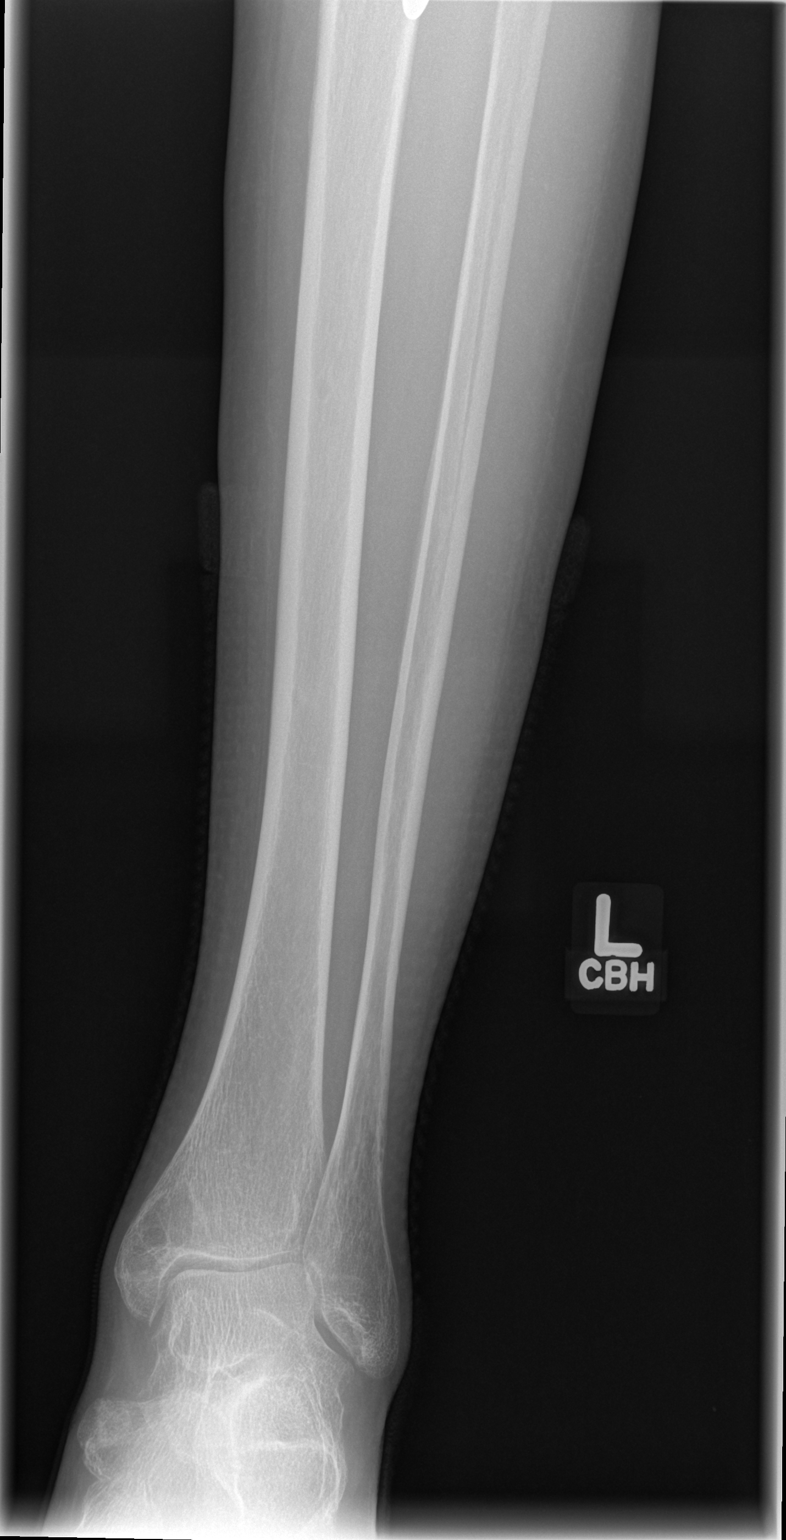

[t tib/fib lat left (1 of 2)]
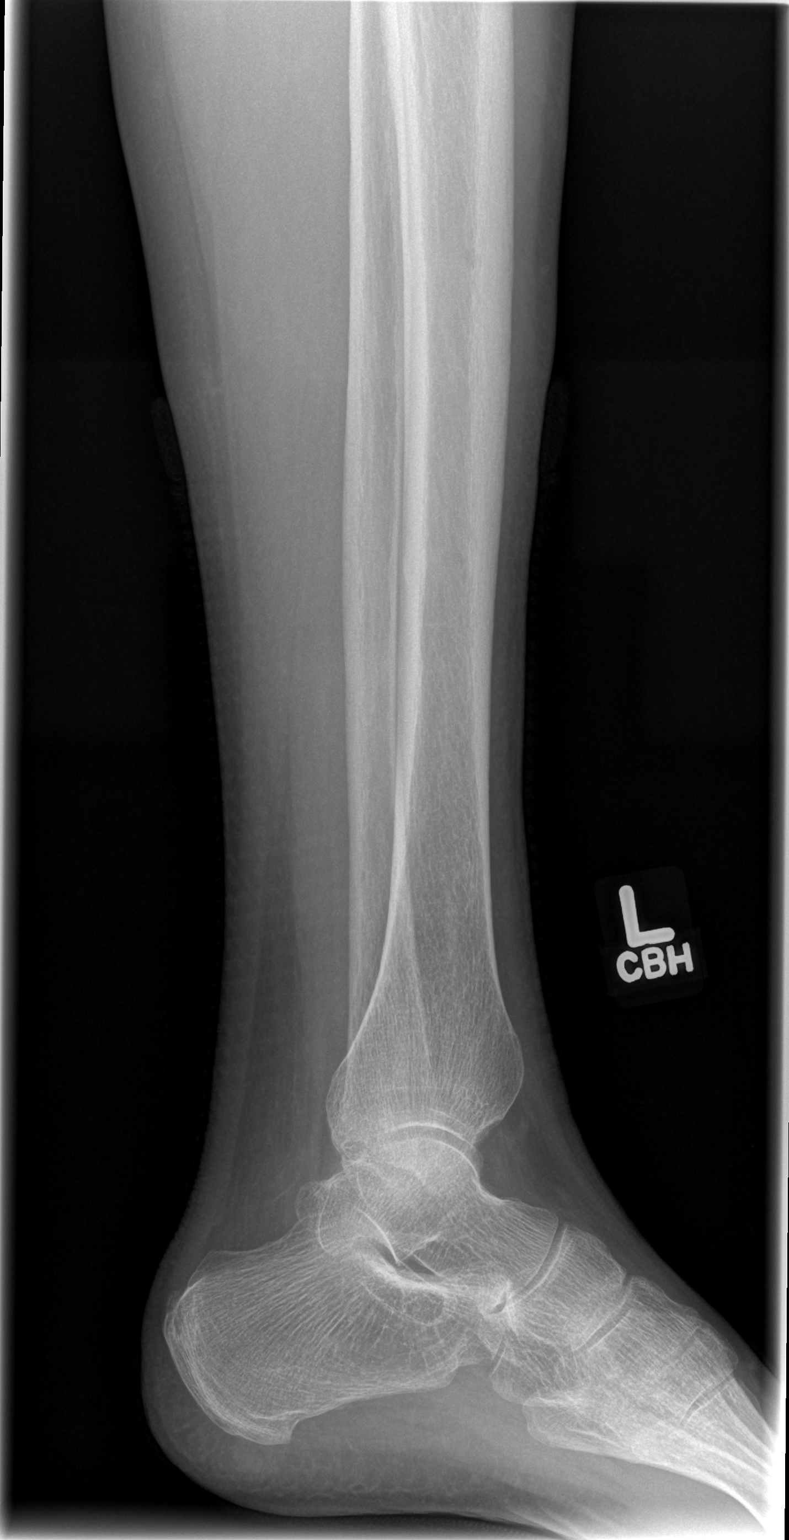

[t tib/fib lat left (2 of 2)]
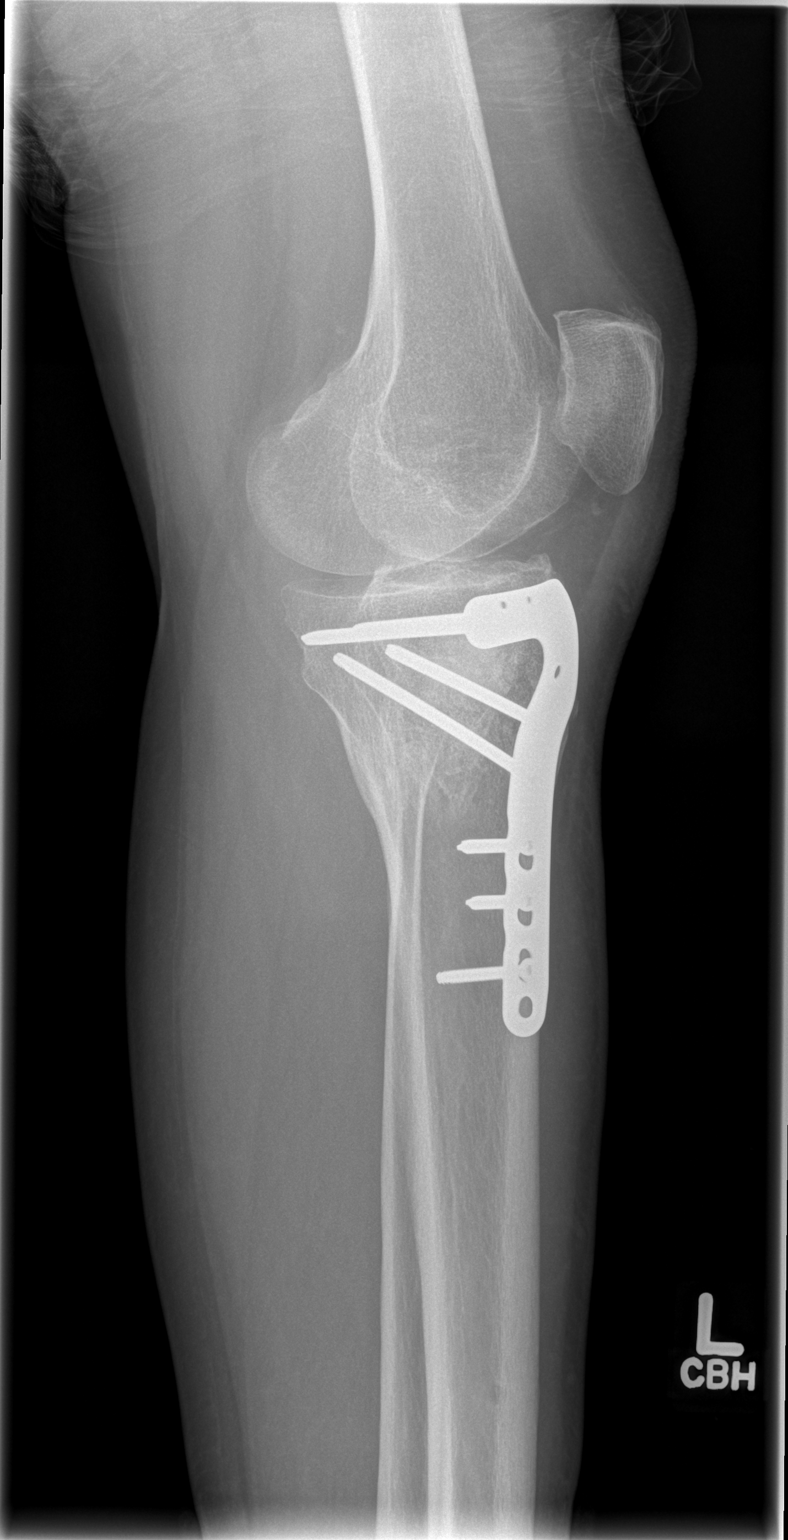

[4 of 4 positions shown; findings below may reference images not displayed]

FINDINGS: The patient is status post ORIF with lateral plate screw
fixation of a proximal tibial fracture.  The fracture appears to
have healed.  Mild osteopenia is present.  The hardware is intact.
The knee is located.  No acute bone or soft tissue abnormality is
evident.
IMPRESSION: 1.  Now status post ORIF for a the proximal tibial fracture.
2.  The fracture appears to have healed.
3.  No acute bone or soft tissue abnormality.

## 2011-12-12 MED ORDER — HYDROCODONE-ACETAMINOPHEN 5-325 MG PO TABS: 1.0000 | ORAL_TABLET | Freq: Four times a day (QID) | ORAL | Status: AC | PRN

## 2011-12-12 NOTE — ED Notes (Signed)
Pt A/O x 4.  No distress noted.  Pt ambulatory without difficulty.

## 2011-12-12 NOTE — Discharge Instructions (Signed)
Pain Medicine Instructions You have been given a prescription for pain medicines. These medicines may affect your ability to think clearly. They may also affect your ability to perform physical activities. Take these medicines only as needed for pain. You do not need to take them if you are not having pain, unless directed by your caregiver. You can take less than the prescribed dose if you find a smaller amount of medicine controls the pain. It may not be possible to make all of your pain go away, but you should be comfortable enough to move, breathe, and take care of yourself. After you start taking pain medicines, while taking the medicines, and for 8 hours after stopping the medicines:  Do not drive.   Do not operate machinery.   Do not operate power tools.   Do not sign legal documents.   Do not supervise children by yourself.   Do not participate in activities that require climbing or being in high places.   Do not enter a body of water (lake, river, ocean, spa, swimming pool) without an adult nearby who can help you.  You may have been prescribed a pain medicine that contains acetaminophen (paracetamol). If so, take only the amount directed by your caregiver. Do not take any other acetaminophen while taking this medicine. An overdose of acetaminophen can result in severe liver damage. If you are taking other medicines, check the active ingredients for acetaminophen. Acetaminophen is found in hundreds of over-the-counter and prescription medicines. These include cold relief products, menstrual cramp relief medicines, fever-reducing medicines, acid indigestion relief products, and pain relief products. HOME CARE INSTRUCTIONS   Do not drink alcohol, take sleeping pills, or take other medicines until at least 8 hours after your last dose of pain medicine, or as directed by your caregiver.   Use a bulk stool softener if you become constipated from your pain medicines. Increasing your intake  of fruits and vegetables will also help.   Write down the times when you take your medicines. Look at the times before taking your next dose of medicine. It is easy to become confused while on pain medicines. Recording the times helps you to avoid an overdose.  SEEK MEDICAL CARE IF:  Your medicine is not helping the pain go away.   You vomit or have diarrhea shortly after taking the medicine.   You develop new pain in areas that did not hurt before.  SEEK IMMEDIATE MEDICAL CARE IF:  You feel dizzy or faint.   You feel there are other problems that might be caused by your medicine.  MAKE SURE YOU:   Understand these instructions.   Will watch your condition.   Will get help right away if you are not doing well or get worse.  Document Released: 12/16/2000 Document Revised: 08/29/2011 Document Reviewed: 08/24/2010 [facility] Patient Information 2012 [place], [place]  Crutch Use You have been prescribed crutches to take weight off one of your lower legs or feet (extremities). When using crutches, make sure you are not putting pressure on the armpit (axilla). This could cause damage to the nerves that extend from your axilla to the hand and arm. When fitted properly the crutches should be 2 to 3 finger widths below the axilla. Your weight should be supported by your hand, and not by resting upon the crutch with the axilla. When walking, first step with the crutches, then swing the healthy leg through and slightly ahead. When going up stairs, first step up with the healthy  leg and then follow with the crutches and injured leg up to the same step, and so forth. If there is a handrail, hold both crutches in one hand, place your other hand on the handrail, and while placing your weight on your arms, lift your good leg to the step, then bring the crutches and the injured leg up to that step. Repeat for each step. When going down stairs, first step with the injured leg and crutches, following down  with the healthy leg to the same step. Be very careful, as going down stairs with crutches is very challenging. If you feel wobbly or nervous, sit down and inch yourself down the stairs on your butt. To get up from a chair, hold injured leg forward, grab armrest with one hand and the top of the crutches with the other hand. Using these supports, pull yourself up to a standing position. Reverse this procedure for sitting. See your caregiver for follow up as suggested. If you are discharged in an ace wrap and develop numbness, tingling, swelling, or increased pain, loosen the ace wrap and re-wrap looser. If these problems persist, see your caregiver as needed. If you have been instructed to use partial weight bearing, bear (apply) the amount of weight as suggested by your caregiver. Do not bear weight in an amount that causes pain on the area of injury. Document Released: 09/06/2000 Document Revised: 08/29/2011 Document Reviewed: 11/14/2008 [facility] Patient Information 2012 [place], [place].Marland Kitchen

## 2011-12-12 NOTE — Progress Notes (Signed)
Orthopedic Tech Progress Note Patient Details:  Taylor Ochoa [birth year] [id number]  Other Ortho Devices Type of Ortho Device: Crutches Ortho Device Interventions: Casandra Doffing 12/12/2011, 9:56 PM

## 2011-12-12 NOTE — ED Notes (Signed)
Pt had a rod placed in his left leg 12/2009 and yesterday he began having pain in his left leg.  Pt denies any trauma but noted that there is a swollen area under his knee (bump protruding).  Pt states that this is very painful, able to walk but it is difficult due to the pain

## 2011-12-12 NOTE — ED Provider Notes (Signed)
History     CSN: [id number]  Arrival date & time 12/12/11  1647   First MD Initiated Contact with Patient 12/12/11 2119      Chief Complaint  Patient presents with  . Leg Pain    (Consider location/radiation/quality/duration/timing/severity/associated sxs/prior treatment) Patient is a 56 y.o. male presenting with leg pain. The history is provided by the patient. No language interpreter was used.  Leg Pain  The incident occurred yesterday. The incident occurred at work. There was no injury mechanism. The pain is present in the left knee. The quality of the pain is described as throbbing. The pain is severe. The pain has been fluctuating since onset. Pertinent negatives include no numbness, no inability to bear weight, no loss of motion, no muscle weakness, no loss of sensation and no tingling. He reports no foreign bodies present. The symptoms are aggravated by activity and bearing weight. He has tried NSAIDs for the symptoms. The treatment provided mild relief.    History reviewed. No pertinent past medical history.  Past Surgical History  Procedure Date  . Leg surgery     rod placed in left lef 12/2009    No family history on file.  History  Substance Use Topics  . Smoking status: Current Everyday Smoker -- 1.0 packs/day    Types: Cigarettes  . Smokeless tobacco: Not on file  . Alcohol Use: No      Review of Systems  Neurological: Negative for tingling and numbness.  All other systems reviewed and are negative.    Allergies  Review of patient's allergies indicates no known allergies.  Home Medications   Current Outpatient Rx  Name Route Sig Dispense Refill  . IBUPROFEN 200 MG PO TABS Oral Take 600 mg by mouth every 6 (six) hours as needed. For pain      BP 123/79  Pulse 70  Temp(Src) 97.2 F (36.2 C) (Oral)  Resp 18  SpO2 99%  Physical Exam  Nursing note and vitals reviewed. Constitutional: He is oriented to person, place, and time. He appears  well-developed and well-nourished.  HENT:  Head: Normocephalic.  Eyes: Pupils are equal, round, and reactive to light.  Neck: Normal range of motion. Neck supple.  Cardiovascular: Normal rate, regular rhythm, normal heart sounds and intact distal pulses.   Pulmonary/Chest: Effort normal and breath sounds normal.  Abdominal: Soft. Bowel sounds are normal.  Musculoskeletal: Normal range of motion. He exhibits tenderness.  Neurological: He is alert and oriented to person, place, and time.  Skin: Skin is warm and dry.  Psychiatric: He has a normal mood and affect. His behavior is normal. Judgment and thought content normal.    ED Course  Procedures (including critical care time)  Labs Reviewed - No data to display Dg Tibia/fibula Left  12/12/2011  *RADIOLOGY REPORT*  Clinical Data: Knot on the lateral tib-fib yesterday.  Previous surgery.  Leg pain.  LEFT TIBIA AND FIBULA - 2 VIEW  Comparison: Two-view knee films 02/06/2010.  Findings: The patient is status post ORIF with lateral plate screw fixation of a proximal tibial fracture.  The fracture appears to have healed.  Mild osteopenia is present.  The hardware is intact. The knee is located.  No acute bone or soft tissue abnormality is evident.  IMPRESSION:  1.  Now status post ORIF for a the proximal tibial fracture. 2.  The fracture appears to have healed. 3.  No acute bone or soft tissue abnormality.  Original Report Authenticated By: Jamesetta Orleans. MATTERN, M.D.  No diagnosis found.  Leg pain at site of prior ORIF.  Radiology results reviewed and discussed with patient.  Normal alignment.  No acute abnormality.  Patient to follow-up with Dr. Carola Frost if symptoms persist.  MDM          Jimmye Norman, NP 12/12/11 2135

## 2011-12-18 NOTE — ED Provider Notes (Signed)
Medical screening examination/treatment/procedure(s) were performed by non-physician practitioner and as supervising physician I was immediately available for consultation/collaboration.   Dione Booze, MD 12/18/11 [phone]

## 2012-03-01 ENCOUNTER — Encounter (HOSPITAL_COMMUNITY): Payer: Self-pay | Admitting: Emergency Medicine

## 2012-03-01 ENCOUNTER — Emergency Department (HOSPITAL_COMMUNITY)
Admission: EM | Admit: 2012-03-01 | Discharge: 2012-03-01 | Disposition: A | Discharge status: Home / Self Care | Payer: Managed Care, Other (non HMO) | Attending: Emergency Medicine | Admitting: Emergency Medicine

## 2012-03-01 DIAGNOSIS — K0889 Other specified disorders of teeth and supporting structures: Secondary | ICD-10-CM

## 2012-03-01 DIAGNOSIS — K089 Disorder of teeth and supporting structures, unspecified: Secondary | ICD-10-CM | POA: Insufficient documentation

## 2012-03-01 DIAGNOSIS — F17200 Nicotine dependence, unspecified, uncomplicated: Secondary | ICD-10-CM | POA: Insufficient documentation

## 2012-03-01 MED ORDER — OXYCODONE-ACETAMINOPHEN 5-325 MG PO TABS: 2.0000 | ORAL_TABLET | ORAL | Status: AC | PRN

## 2012-03-01 NOTE — Discharge Instructions (Signed)
Dental Pain  A tooth ache may be caused by cavities (tooth decay). Cavities expose the nerve of the tooth to air and hot or cold temperatures. It may come from an infection or abscess (also called a boil or furuncle) around your tooth. It is also often caused by dental caries (tooth decay). This causes the pain you are having.  DIAGNOSIS   Your caregiver can diagnose this problem by exam.  TREATMENT   · If caused by an infection, it may be treated with medications which kill germs (antibiotics) and pain medications as prescribed by your caregiver. Take medications as directed.  · Only take over-the-counter or prescription medicines for pain, discomfort, or fever as directed by your caregiver.  · Whether the tooth ache today is caused by infection or dental disease, you should see your dentist as soon as possible for further care.  SEEK MEDICAL CARE IF:  The exam and treatment you received today has been provided on an emergency basis only. This is not a substitute for complete medical or dental care. If your problem worsens or new problems (symptoms) appear, and you are unable to meet with your dentist, call or return to this location.  SEEK IMMEDIATE MEDICAL CARE IF:   · You have a fever.  · You develop redness and swelling of your face, jaw, or neck.  · You are unable to open your mouth.  · You have severe pain uncontrolled by pain medicine.  MAKE SURE YOU:   · Understand these instructions.  · Will watch your condition.  · Will get help right away if you are not doing well or get worse.  Document Released: 09/09/2005 Document Revised: 08/29/2011 Document Reviewed: 04/27/2008  [employer]® Patient Information ©2012 [employer].

## 2012-03-01 NOTE — ED Provider Notes (Signed)
History   This chart was scribed for Flint Melter, MD scribed by Magnus Sinning. The patient was seen in room STRE4/STRE4 seen at 11:40    CSN: [id number]  Arrival date & time 03/01/12  7829   First MD Initiated Contact with Patient 03/01/12 1112      Chief Complaint  Patient presents with  . Dental Pain    (Consider location/radiation/quality/duration/timing/severity/associated sxs/prior treatment) HPI Taylor Ochoa is a 56 y.o. male who presents to the Emergency Department complaining of persistent moderate upper left sided dental pain with associated gum swelling,onset 3 days. States that he has taken a whole bottle (approximately 40 tablets) of ibuprofen in 3 days with no relief. Pt explains he does not currently see a dentist.  Denies any associated sxs. History reviewed. No pertinent past medical history.  Past Surgical History  Procedure Date  . Leg surgery     rod placed in left lef 12/2009  . Wrist surgery     History reviewed. No pertinent family history.  History  Substance Use Topics  . Smoking status: Current Everyday Smoker -- 1.0 packs/day    Types: Cigarettes  . Smokeless tobacco: Not on file  . Alcohol Use: No      Review of Systems  Allergies  Review of patient's allergies indicates no known allergies.  Home Medications   Current Outpatient Rx  Name Route Sig Dispense Refill  . IBUPROFEN 200 MG PO TABS Oral Take 600 mg by mouth every 6 (six) hours as needed. For pain    . OXYCODONE-ACETAMINOPHEN 5-325 MG PO TABS Oral Take 2 tablets by mouth every 4 (four) hours as needed for pain. 15 tablet 0    BP 137/91  Pulse 70  Temp(Src) 98.1 F (36.7 C) (Oral)  Resp 20  SpO2 99%  Physical Exam  Nursing note and vitals reviewed. Constitutional: He is oriented to person, place, and time. He appears well-developed and well-nourished. No distress.  HENT:  Head: Normocephalic and atraumatic.       Generally poor dentition. No areas of abscess,  bleeding,  or discharge. No trismus.   Eyes: Conjunctivae and EOM are normal.  Neck: Neck supple. No tracheal deviation present.  Cardiovascular: Normal rate.   Pulmonary/Chest: Effort normal. No respiratory distress.  Abdominal: He exhibits no distension.  Musculoskeletal: Normal range of motion. He exhibits no edema.  Neurological: He is alert and oriented to person, place, and time. No sensory deficit.  Skin: Skin is warm and dry.  Psychiatric: He has a normal mood and affect. His behavior is normal.    ED Course  Procedures (including critical care time) DIAGNOSTIC STUDIES: Oxygen Saturation is 99% on room air, normal by my interpretation.    COORDINATION OF CARE:  Labs Reviewed - No data to display No results found.   1. Pain, dental       MDM  Nonspecific dental pain, with poor dentition. Doubt abscess, osteomyelitis, or sinusitis.    I personally performed the services described in this documentation, which was scribed in my presence. The recorded information has been reviewed and considered.    Plan: Home Medications- Percocet; Home Treatments- soft foods; Recommended follow up- Dentist asap        Flint Melter, MD 03/01/12 [phone]

## 2012-03-01 NOTE — ED Notes (Signed)
Pt c/o left side toothache x 3 days.

## 2012-06-07 ENCOUNTER — Emergency Department (HOSPITAL_COMMUNITY): Payer: Self-pay

## 2012-06-07 ENCOUNTER — Encounter (HOSPITAL_COMMUNITY): Payer: Self-pay | Admitting: *Deleted

## 2012-06-07 ENCOUNTER — Emergency Department (HOSPITAL_COMMUNITY)
Admission: EM | Admit: 2012-06-07 | Discharge: 2012-06-08 | Disposition: A | Discharge status: Home / Self Care | Payer: Self-pay | Attending: Emergency Medicine | Admitting: Emergency Medicine

## 2012-06-07 DIAGNOSIS — F17200 Nicotine dependence, unspecified, uncomplicated: Secondary | ICD-10-CM | POA: Insufficient documentation

## 2012-06-07 DIAGNOSIS — M25562 Pain in left knee: Secondary | ICD-10-CM

## 2012-06-07 DIAGNOSIS — M25569 Pain in unspecified knee: Secondary | ICD-10-CM | POA: Insufficient documentation

## 2012-06-07 IMAGING — CR DG KNEE COMPLETE 4+V*L*
4 series · 4 of 4 positions shown · non-contrast
Comparison: Plain films [DATE] and [DATE].

CLINICAL DATA: Left knee and leg pain.

LEFT KNEE - COMPLETE 4+ VIEW

[t knee ap left]
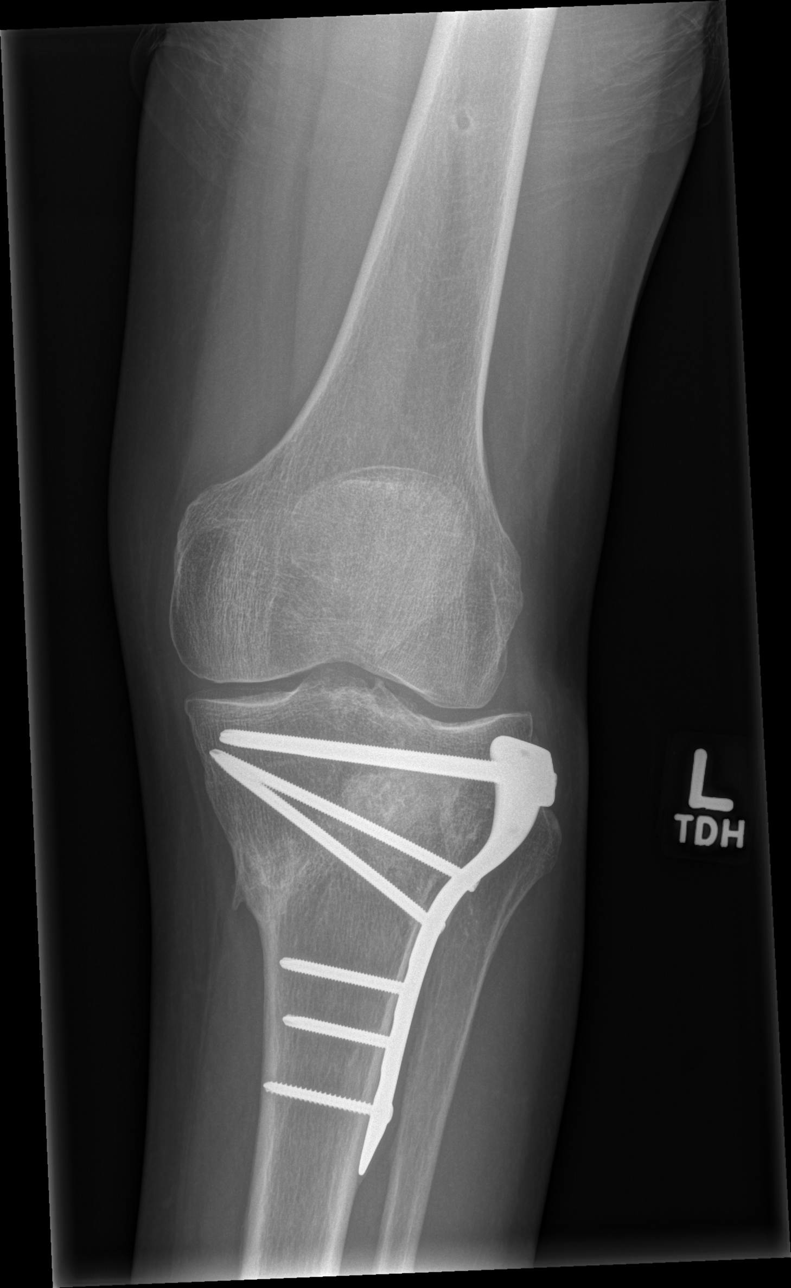

[t knee obl left (1 of 2)]
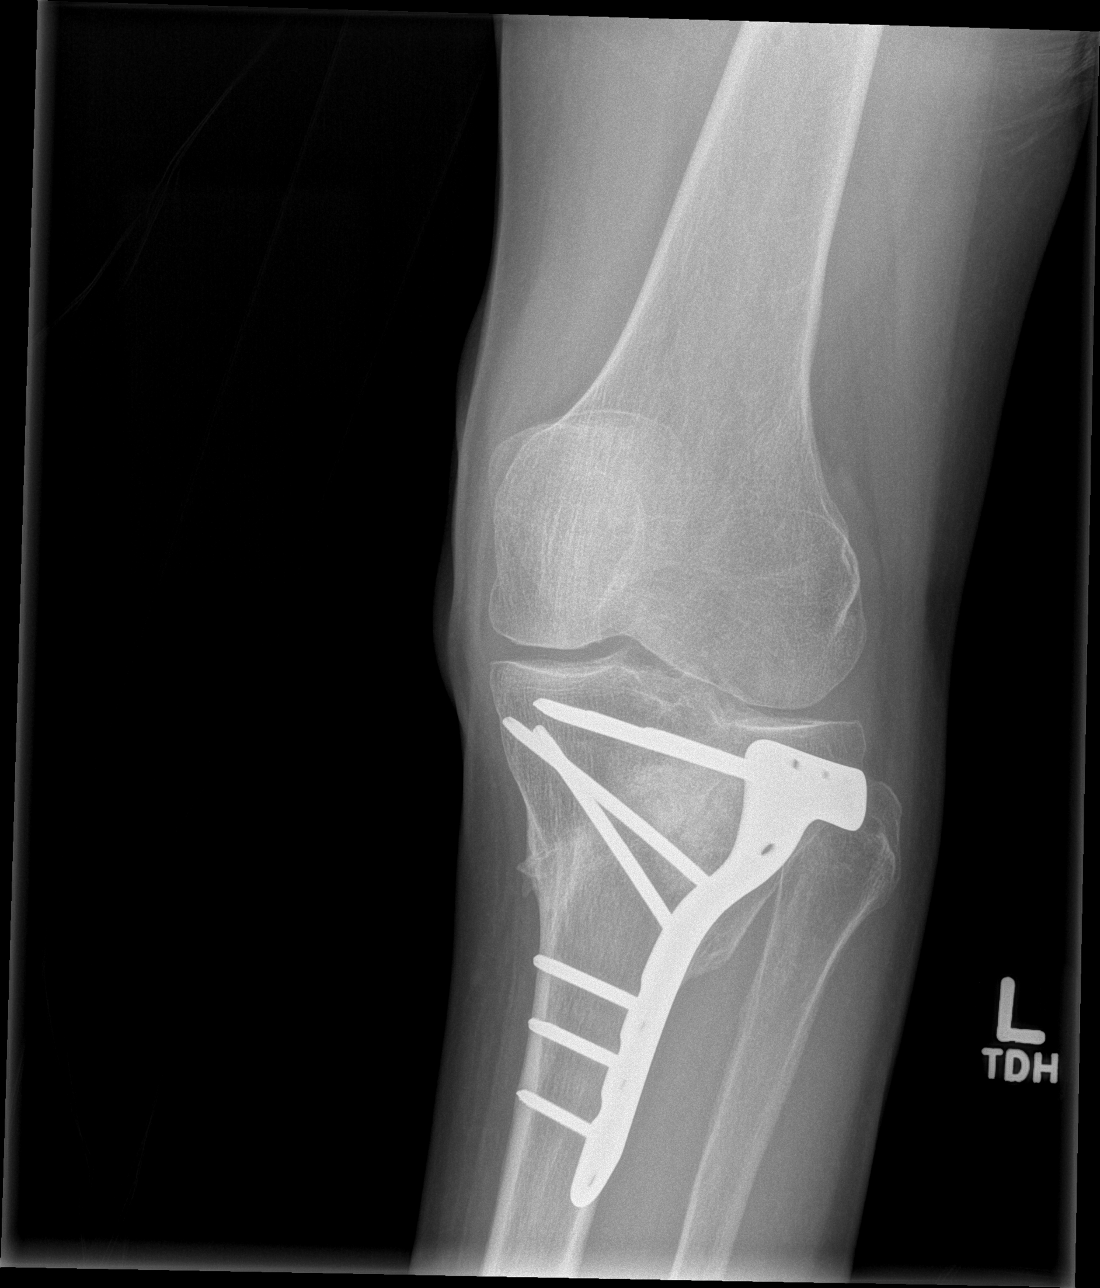

[t knee obl left (2 of 2)]
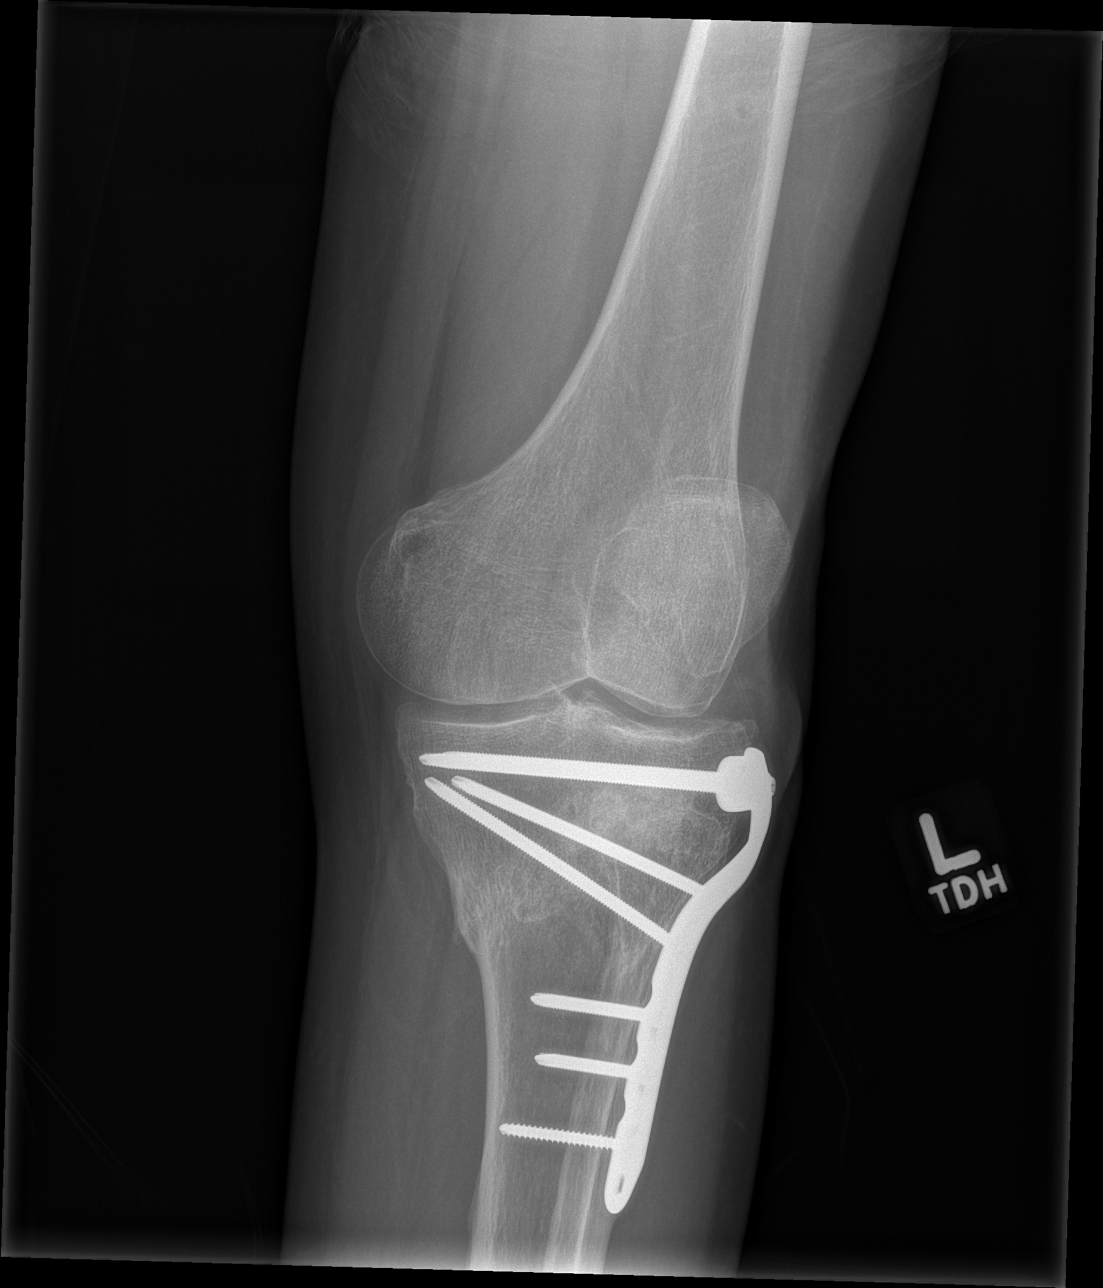

[t knee lat left]
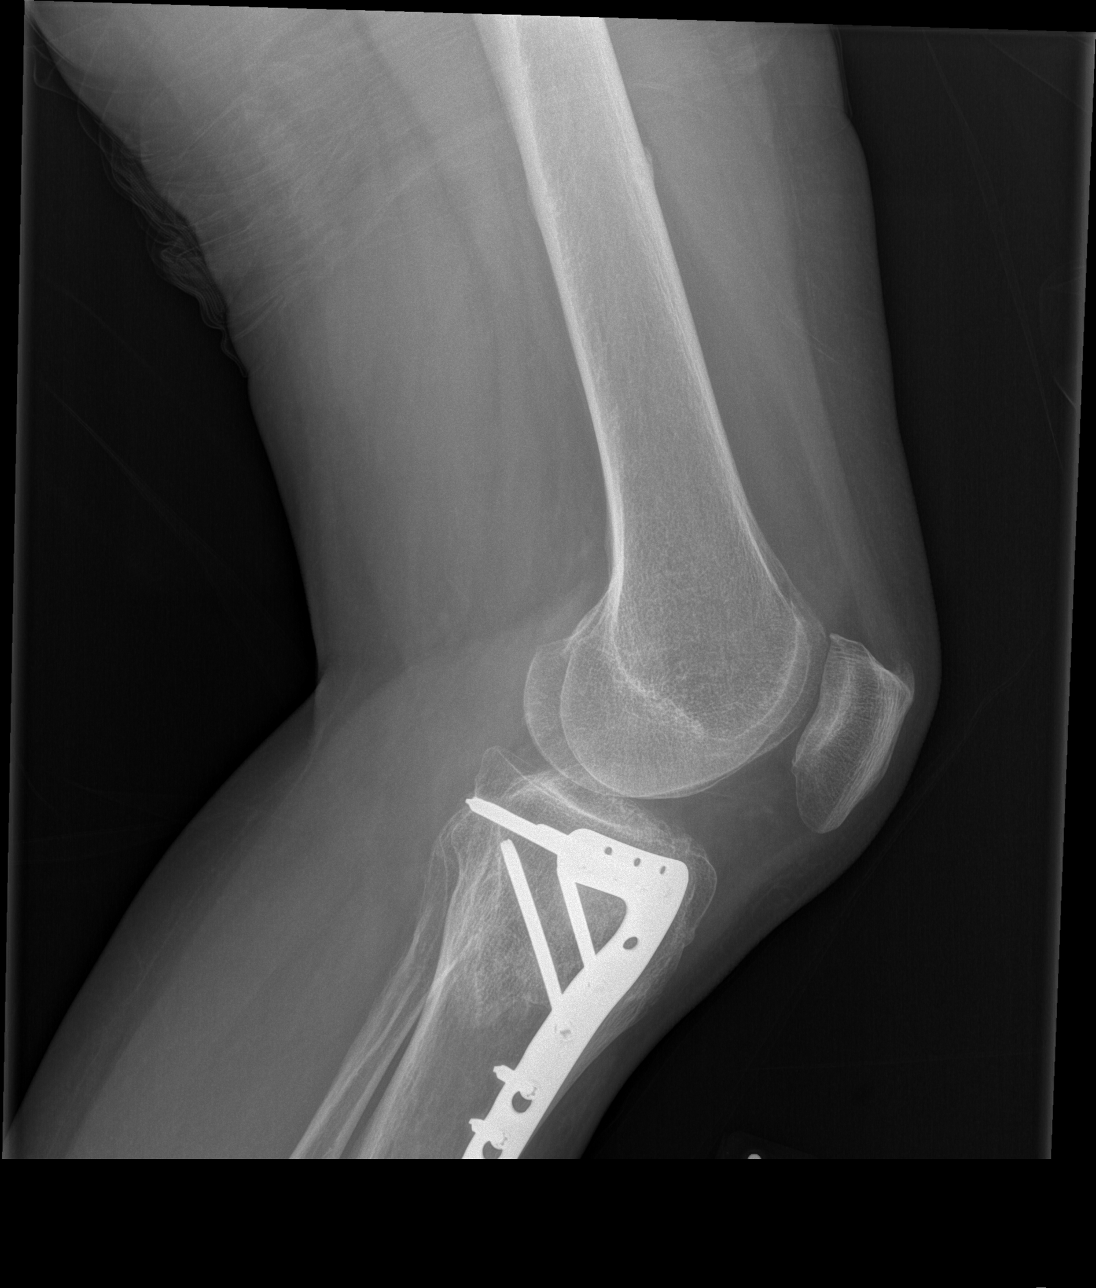

[4 of 4 positions shown; findings below may reference images not displayed]

FINDINGS: There is no acute bony or joint abnormality.  The patient
has an old healed proximal tibial fracture with plate and screws in
place.
IMPRESSION: No acute finding.

## 2012-06-07 IMAGING — CR DG TIBIA/FIBULA 2V*L*
4 series · 4 of 4 positions shown · non-contrast
Comparison: Plain films of the left knee [DATE].

CLINICAL DATA: Pain.  History of prior fracture.

LEFT TIBIA AND FIBULA - 2 VIEW

[t tib-fib ap left (1 of 2)]
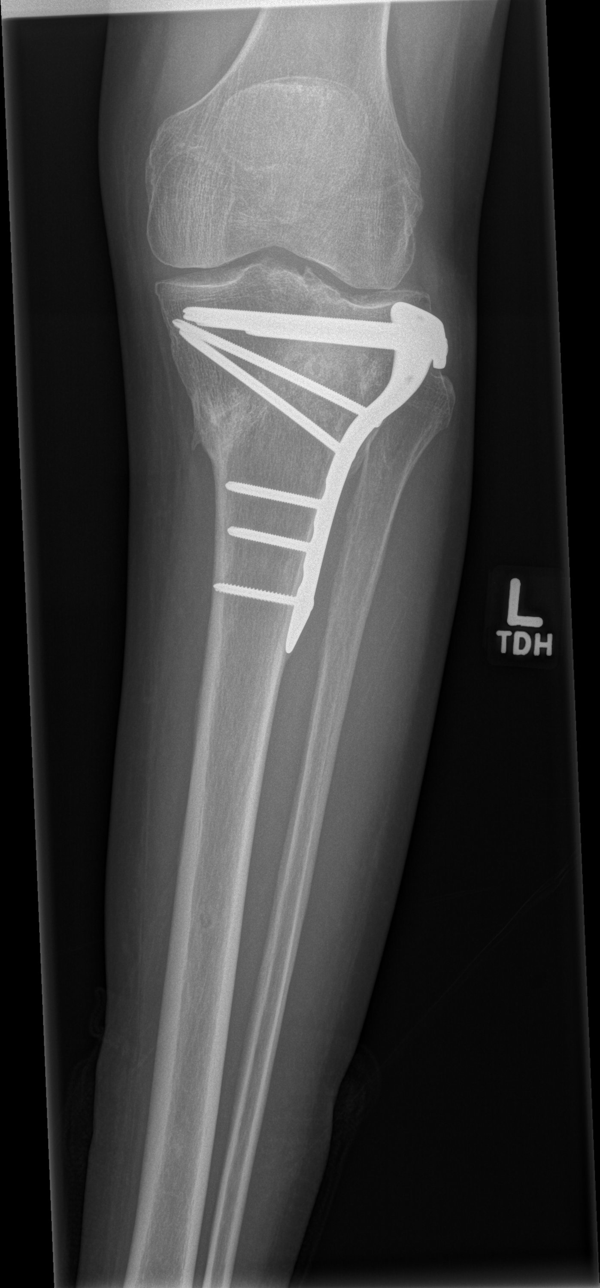

[t tib-fib ap left (2 of 2)]
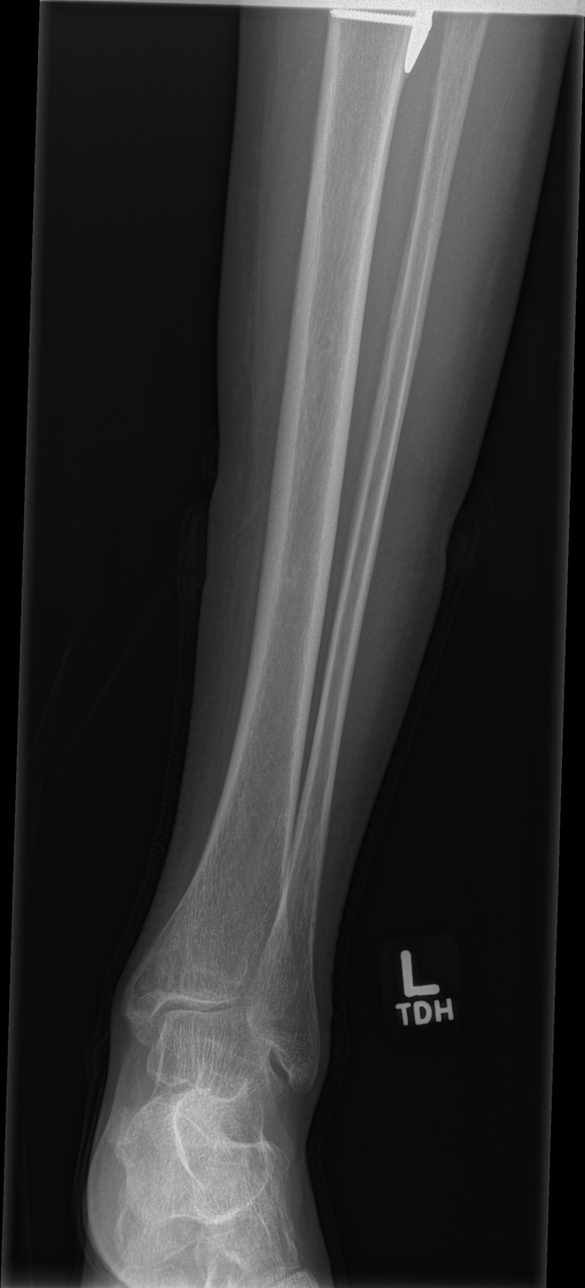

[t tib-fib lat left (1 of 2)]
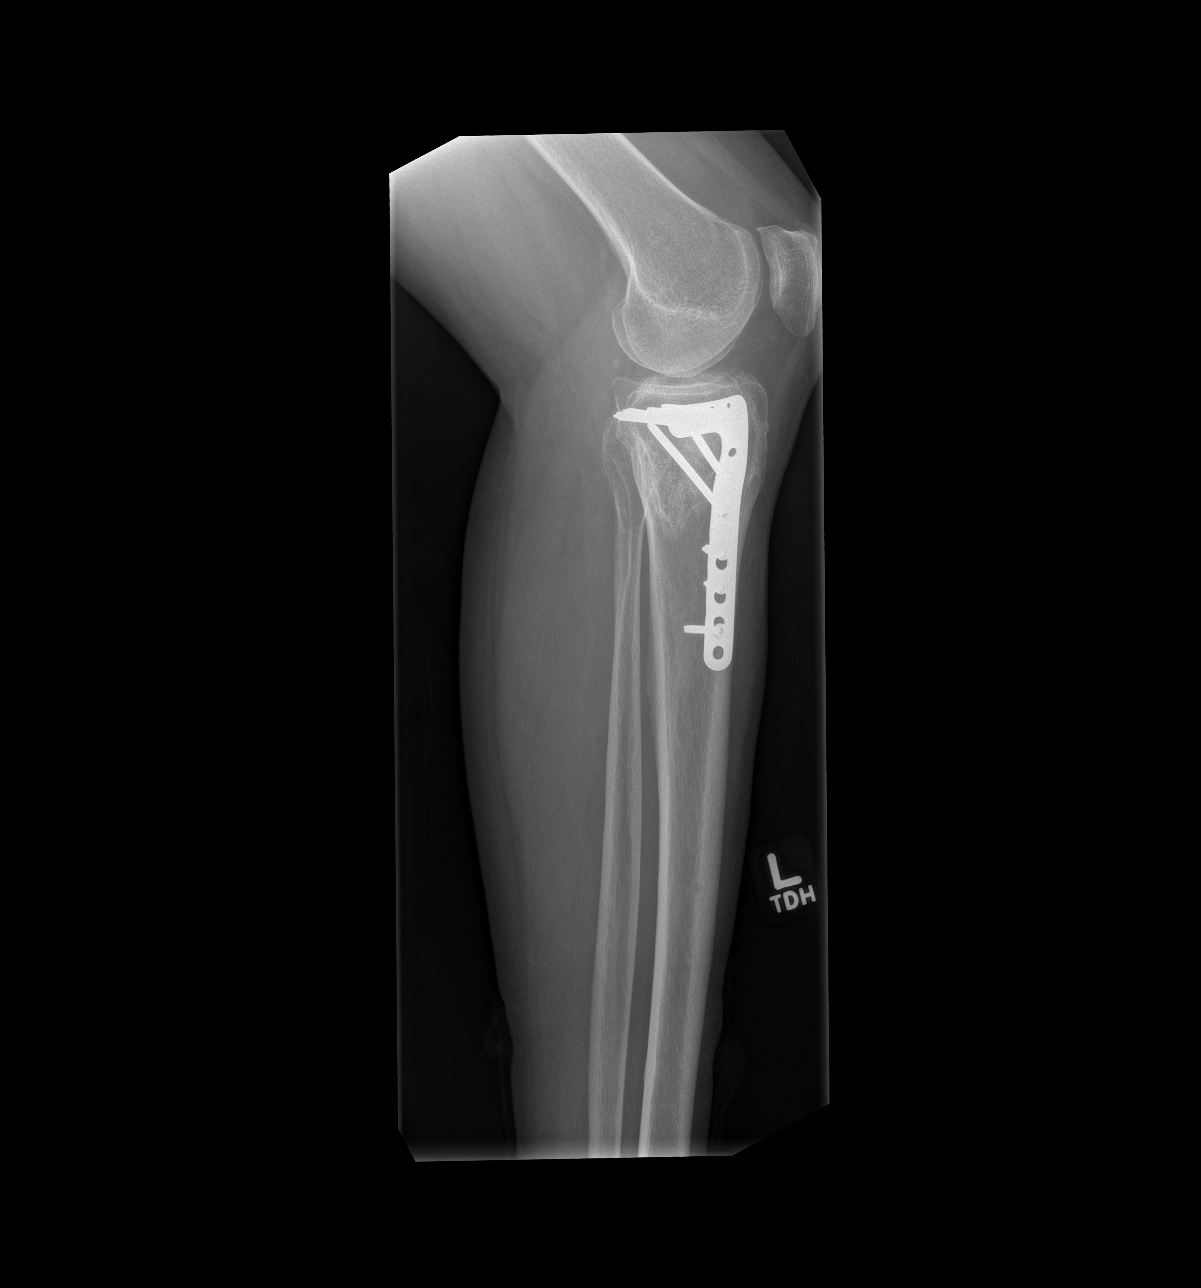

[t tib-fib lat left (2 of 2)]
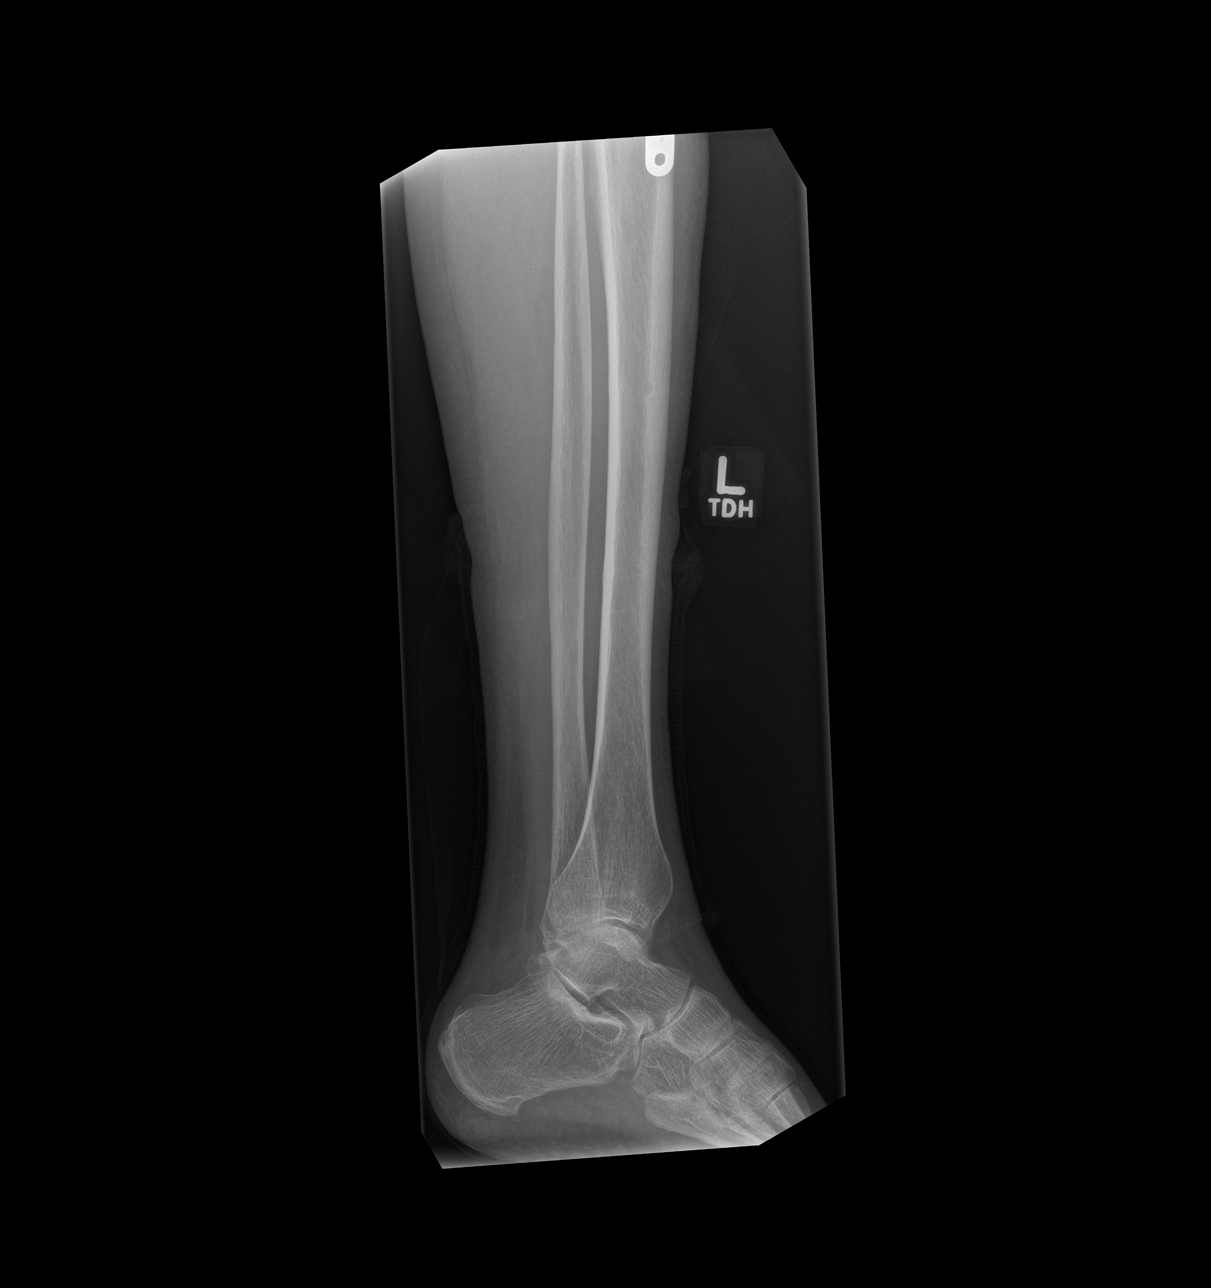

[4 of 4 positions shown; findings below may reference images not displayed]

FINDINGS: Lateral plate and screws in the tibia fixing an old
healed proximal tibial fracture identified.  Hardware is intact
without evidence of complication.  There is no acute bony or joint
abnormality.
IMPRESSION: No acute finding.  Old healed tibial fracture with hardware in
place.

## 2012-06-07 MED ORDER — HYDROCODONE-ACETAMINOPHEN 5-325 MG PO TABS: 1.0000 | ORAL_TABLET | ORAL | Status: AC | PRN
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
## 2012-06-07 NOTE — ED Notes (Signed)
PT has a metal rod in LT lower leg.PT reports his Lt leg  Gave out on him  And he now has increased pain. He has been taking  Ibuprofen  For pain with out relief. DR Carola Frost [facility]) unable to see PT until the middle of Oct.

## 2012-06-07 NOTE — Progress Notes (Signed)
Orthopedic Tech Progress Note Patient Details:  Taylor Ochoa 03-Feb-1956 [id number]  Ortho Devices Type of Ortho Device: Knee Sleeve   Haskell Flirt 06/07/2012, 11:59 PM

## 2012-06-10 NOTE — ED Provider Notes (Signed)
Medical screening examination/treatment/procedure(s) were performed by non-physician practitioner and as supervising physician I was immediately available for consultation/collaboration.   Loren Racer, MD 06/10/12 [phone]

## 2012-10-23 ENCOUNTER — Encounter (HOSPITAL_COMMUNITY): Payer: Self-pay | Admitting: *Deleted

## 2012-10-23 ENCOUNTER — Emergency Department (HOSPITAL_COMMUNITY)
Admission: EM | Admit: 2012-10-23 | Discharge: 2012-10-23 | Disposition: A | Discharge status: Home / Self Care | Payer: Self-pay | Attending: Emergency Medicine | Admitting: Emergency Medicine

## 2012-10-23 DIAGNOSIS — K0889 Other specified disorders of teeth and supporting structures: Secondary | ICD-10-CM

## 2012-10-23 DIAGNOSIS — K089 Disorder of teeth and supporting structures, unspecified: Secondary | ICD-10-CM | POA: Insufficient documentation

## 2012-10-23 DIAGNOSIS — F17200 Nicotine dependence, unspecified, uncomplicated: Secondary | ICD-10-CM | POA: Insufficient documentation

## 2012-10-23 DIAGNOSIS — K08109 Complete loss of teeth, unspecified cause, unspecified class: Secondary | ICD-10-CM | POA: Insufficient documentation

## 2012-10-23 MED ORDER — OXYCODONE-ACETAMINOPHEN 5-325 MG PO TABS: ORAL_TABLET | ORAL | Status: DC

## 2012-10-23 MED ORDER — OXYCODONE-ACETAMINOPHEN 5-325 MG PO TABS
1.0000 | ORAL_TABLET | Freq: Once | ORAL | Status: AC
  Administered 2012-10-23: 1 via ORAL
  Filled 2012-10-23: qty 1

## 2012-10-23 MED ORDER — AMOXICILLIN 500 MG PO CAPS: 500.0000 mg | ORAL_CAPSULE | Freq: Three times a day (TID) | ORAL | Status: DC

## 2012-10-23 NOTE — ED Notes (Signed)
Pt states toothache started this AM; pt states he has been getting his teeth taken out little by little but job recently switch insurance on him and is waiting on new card; Pt denies numbness/tingling; pt denies n/v/d. Pt denies fevers/chills. Pt alert and mentating appropriately.

## 2012-10-23 NOTE — ED Provider Notes (Signed)
History  This chart was scribed for non-physician practitioner working with Taylor Lennert, MD by Ardeen Jourdain, ED Scribe. This patient was seen in room TR04C/TR04C and the patient's care was started at 2028.  CSN: [id number]  Arrival date & time 10/23/12  2004   First MD Initiated Contact with Patient 10/23/12 2028      Chief Complaint  Patient presents with  . Dental Pain     The history is provided by the patient. No language interpreter was used.    Taylor Ochoa is a 57 y.o. male who presents to the Emergency Department complaining of tooth pain that began this morning and has been gradually worsening. He states he has multiple teeth pulled in the past and needs a few more pulled. He reports taking ibuprofen with no relief. He denies fever, nausea and emesis as associated symptoms.    History reviewed. No pertinent past medical history.  Past Surgical History  Procedure Date  . Leg surgery     rod placed in left lef 12/2009  . Wrist surgery     No family history on file.  History  Substance Use Topics  . Smoking status: Current Every Day Smoker -- 1.0 packs/day    Types: Cigarettes  . Smokeless tobacco: Not on file  . Alcohol Use: Yes      Review of Systems  Constitutional: Negative for fever.  HENT: Positive for dental problem.   Respiratory: Negative for shortness of breath.   Cardiovascular: Negative for chest pain.  Gastrointestinal: Negative for nausea, vomiting, abdominal pain and diarrhea.  All other systems reviewed and are negative.    Allergies  Review of patient's allergies indicates no known allergies.  Home Medications   Current Outpatient Rx  Name  Route  Sig  Dispense  Refill  . IBUPROFEN 200 MG PO TABS   Oral   Take 600 mg by mouth every 6 (six) hours as needed. For pain         . AMOXICILLIN 500 MG PO CAPS   Oral   Take 1 capsule (500 mg total) by mouth 3 (three) times daily.   30 capsule   0   . OXYCODONE-ACETAMINOPHEN  5-325 MG PO TABS      1 to 2 tabs PO q6hrs  PRN for pain   15 tablet   0     Triage Vitals: BP 137/73  Pulse 77  Temp 97.9 F (36.6 C) (Oral)  Resp 18  SpO2 97%  Physical Exam  Nursing note and vitals reviewed. Constitutional: He is oriented to person, place, and time. He appears well-developed and well-nourished. No distress.  HENT:  Head: Normocephalic and atraumatic.  Mouth/Throat: Oropharynx is clear and moist.       Multiple missing teeth and dental carries with no signs of abscesses   Eyes: Conjunctivae normal and EOM are normal.  Cardiovascular: Normal rate.   Pulmonary/Chest: Effort normal. No stridor.  Musculoskeletal: Normal range of motion.  Neurological: He is alert and oriented to person, place, and time.  Psychiatric: He has a normal mood and affect.    ED Course  Procedures (including critical care time)  DIAGNOSTIC STUDIES: Oxygen Saturation is 97% on room air, normal by my interpretation.    COORDINATION OF CARE:  9:25 PM: Discussed treatment plan which includes follow up with a dentist and pain medication with pt at bedside and pt agreed to plan.     Labs Reviewed - No data to display No results  found.   1. Pain, dental       MDM    Pt verbalized understanding and agrees with care plan. Outpatient follow-up and return precautions given.    New Prescriptions   AMOXICILLIN (AMOXIL) 500 MG CAPSULE    Take 1 capsule (500 mg total) by mouth 3 (three) times daily.   OXYCODONE-ACETAMINOPHEN (PERCOCET/ROXICET) 5-325 MG PER TABLET    1 to 2 tabs PO q6hrs  PRN for pain    I personally performed the services described in this documentation, which was scribed in my presence. The recorded information has been reviewed and is accurate.   Wynetta Emery, PA-C 10/23/12 2345

## 2012-10-23 NOTE — ED Notes (Signed)
Pt ambulatory leaving ED; pt has been instructed not to drive and states he has a ride home; Pt given d/c teaching and prescriptions; pt verbalizes understanding of d/c teaching and prescriptions; pt has no further questions upon d/c. Pt does not appear to be in acute distress upon d/c.

## 2012-10-23 NOTE — ED Notes (Signed)
Toothache all day.  He took 5 ibu

## 2012-10-26 NOTE — ED Provider Notes (Signed)
.  attsu  Medical screening examination/treatment/procedure(s) were performed by non-physician practitioner and as supervising physician I was immediately available for consultation/collaboration.   [name], MD 10/26/12 [phone]

## 2012-12-25 ENCOUNTER — Encounter (HOSPITAL_COMMUNITY): Payer: Self-pay | Admitting: Emergency Medicine

## 2012-12-25 ENCOUNTER — Emergency Department (HOSPITAL_COMMUNITY)
Admission: EM | Admit: 2012-12-25 | Discharge: 2012-12-25 | Disposition: A | Discharge status: Home / Self Care | Payer: Self-pay | Attending: Emergency Medicine | Admitting: Emergency Medicine

## 2012-12-25 DIAGNOSIS — R220 Localized swelling, mass and lump, head: Secondary | ICD-10-CM | POA: Insufficient documentation

## 2012-12-25 DIAGNOSIS — K029 Dental caries, unspecified: Secondary | ICD-10-CM | POA: Insufficient documentation

## 2012-12-25 DIAGNOSIS — F17200 Nicotine dependence, unspecified, uncomplicated: Secondary | ICD-10-CM | POA: Insufficient documentation

## 2012-12-25 DIAGNOSIS — K089 Disorder of teeth and supporting structures, unspecified: Secondary | ICD-10-CM | POA: Insufficient documentation

## 2012-12-25 DIAGNOSIS — R221 Localized swelling, mass and lump, neck: Secondary | ICD-10-CM | POA: Insufficient documentation

## 2012-12-25 DIAGNOSIS — K0889 Other specified disorders of teeth and supporting structures: Secondary | ICD-10-CM

## 2012-12-25 MED ORDER — IBUPROFEN 600 MG PO TABS: 600.0000 mg | ORAL_TABLET | Freq: Four times a day (QID) | ORAL | Status: DC | PRN

## 2012-12-25 MED ORDER — HYDROCODONE-ACETAMINOPHEN 5-325 MG PO TABS: ORAL_TABLET | ORAL | Status: DC

## 2012-12-25 MED ORDER — PENICILLIN V POTASSIUM 500 MG PO TABS: 500.0000 mg | ORAL_TABLET | Freq: Three times a day (TID) | ORAL | Status: DC

## 2012-12-25 NOTE — ED Provider Notes (Signed)
History    This chart was scribed for non-physician practitioner working with Vida Roller, MD by Leone Payor, ED Scribe. This patient was seen in room WTR9/WTR9 and the patient's care was started at 1843.   CSN: [id number]  Arrival date & time 12/25/12  1843   First MD Initiated Contact with Patient 12/25/12 1845      Chief Complaint  Patient presents with  . Dental Pain     The history is provided by the patient. No language interpreter was used.    Taylor Ochoa is a 57 y.o. male who presents to the Emergency Department complaining of new, constant dental pain to the left upper side starting yesterday. Pt states he has some associated facial swelling but denies fever, nausea, vomiting. Pt has taken ibuprofen with no relief. Pt has h/o dental problems. Nothing makes symptoms better. No difficulty breathing or swallowing.  Pt is a current everyday smoker and occasional alcohol user.  History reviewed. No pertinent past medical history.  Past Surgical History  Procedure Laterality Date  . Leg surgery      rod placed in left lef 12/2009  . Wrist surgery      No family history on file.  History  Substance Use Topics  . Smoking status: Current Every Day Smoker -- 1.00 packs/day    Types: Cigarettes  . Smokeless tobacco: Not on file  . Alcohol Use: Yes      Review of Systems  Constitutional: Negative for fever.  HENT: Positive for facial swelling and dental problem. Negative for ear pain, sore throat, trouble swallowing and neck pain.   Respiratory: Negative for shortness of breath and stridor.   Gastrointestinal: Negative for nausea and vomiting.  Skin: Negative for color change.  Neurological: Negative for headaches.    Allergies  Review of patient's allergies indicates no known allergies.  Home Medications   Current Outpatient Rx  Name  Route  Sig  Dispense  Refill  . amoxicillin (AMOXIL) 500 MG capsule   Oral   Take 1 capsule (500 mg total) by mouth 3  (three) times daily.   30 capsule   0   . ibuprofen (ADVIL,MOTRIN) 200 MG tablet   Oral   Take 600 mg by mouth every 6 (six) hours as needed. For pain         . oxyCODONE-acetaminophen (PERCOCET/ROXICET) 5-325 MG per tablet      1 to 2 tabs PO q6hrs  PRN for pain   15 tablet   0     There were no vitals taken for this visit.  Physical Exam  Nursing note and vitals reviewed. Constitutional: He appears well-developed and well-nourished. No distress.  HENT:  Head: Normocephalic and atraumatic. No trismus in the jaw.  Right Ear: Tympanic membrane, external ear and ear canal normal.  Left Ear: Tympanic membrane, external ear and ear canal normal.  Nose: Nose normal.  Mouth/Throat: Uvula is midline, oropharynx is clear and moist and mucous membranes are normal. Abnormal dentition. Dental caries present. No dental abscesses or edematous. No tonsillar abscesses.  Advanced periodontal disease. Tenderness to palpation on gumline of left maxillary molar.    Eyes: EOM are normal. Pupils are equal, round, and reactive to light.  Neck: Normal range of motion. Neck supple. No tracheal deviation present.  No neck swelling or Lugwig's angina  Cardiovascular: Normal rate.   Pulmonary/Chest: Effort normal. No respiratory distress.  Musculoskeletal: Normal range of motion.  Neurological: He is alert.  Skin: Skin  is warm and dry.  Psychiatric: He has a normal mood and affect. His behavior is normal.    ED Course  Procedures (including critical care time)  DIAGNOSTIC STUDIES: N/A    COORDINATION OF CARE: 7:01 PM Discussed treatment plan with pt at bedside and pt agreed to plan.    Labs Reviewed - No data to display No results found.   1. Pain, dental    10:06 PM Patient seen and examined.    Vital signs reviewed and are as follows: Filed Vitals:   12/25/12 1907  BP: 113/77  Pulse: 73  Temp: 98.6 F (37 C)  Resp: 20    Patient counseled to take prescribed medications as  directed, return with worsening facial or neck swelling, and to follow-up with their dentist as soon as possible.   Patient counseled on use of narcotic pain medications. Counseled not to combine these medications with others containing tylenol. Urged not to drink alcohol, drive, or perform any other activities that requires focus while taking these medications. The patient verbalizes understanding and agrees with the plan.   MDM  Patient with toothache.  No gross abscess.  Exam unconcerning for Ludwig's angina or other deep tissue infection in neck.  Will treat with penicillin and pain medicine.  Urged patient to follow-up with dentist.         I personally performed the services described in this documentation, which was scribed in my presence. The recorded information has been reviewed and is accurate.   Renne Crigler, PA-C 12/25/12 2206

## 2012-12-25 NOTE — ED Notes (Signed)
Patient with left upper dental pain.  Patient reports numerous decayed teeth.  Rates pain as a 2/10.  Took 3 Motrin at 1500.  Pain better now.

## 2012-12-25 NOTE — ED Provider Notes (Signed)
Medical screening examination/treatment/procedure(s) were performed by non-physician practitioner and as supervising physician I was immediately available for consultation/collaboration.    Lucciano Vitali D Aidaly Cordner, MD 12/25/12 2326 

## 2013-01-28 ENCOUNTER — Emergency Department (HOSPITAL_COMMUNITY)
Admission: EM | Admit: 2013-01-28 | Discharge: 2013-01-28 | Disposition: A | Discharge status: Home / Self Care | Payer: Self-pay | Attending: Emergency Medicine | Admitting: Emergency Medicine

## 2013-01-28 ENCOUNTER — Encounter (HOSPITAL_COMMUNITY): Payer: Self-pay | Admitting: Emergency Medicine

## 2013-01-28 DIAGNOSIS — K029 Dental caries, unspecified: Secondary | ICD-10-CM | POA: Insufficient documentation

## 2013-01-28 DIAGNOSIS — F17200 Nicotine dependence, unspecified, uncomplicated: Secondary | ICD-10-CM | POA: Insufficient documentation

## 2013-01-28 MED ORDER — OXYCODONE-ACETAMINOPHEN 5-325 MG PO TABS
1.0000 | ORAL_TABLET | Freq: Once | ORAL | Status: AC
  Administered 2013-01-28: 1 via ORAL
  Filled 2013-01-28: qty 1

## 2013-01-28 MED ORDER — AMOXICILLIN 500 MG PO CAPS: 500.0000 mg | ORAL_CAPSULE | Freq: Three times a day (TID) | ORAL | Status: DC

## 2013-01-28 MED ORDER — AMOXICILLIN 500 MG PO CAPS
500.0000 mg | ORAL_CAPSULE | Freq: Once | ORAL | Status: AC
  Administered 2013-01-28: 500 mg via ORAL
  Filled 2013-01-28: qty 1

## 2013-01-28 MED ORDER — OXYCODONE-ACETAMINOPHEN 5-325 MG PO TABS: ORAL_TABLET | ORAL | Status: DC

## 2013-01-28 MED ORDER — IBUPROFEN 400 MG PO TABS
800.0000 mg | ORAL_TABLET | ORAL | Status: AC
  Administered 2013-01-28: 800 mg via ORAL
  Filled 2013-01-28: qty 2

## 2013-01-28 NOTE — ED Notes (Signed)
Patient with dental pain, left lower jaw.  Patient states he does not have dental insurance at this time.  Patient does have some mild swelling to lower left jaw.

## 2013-01-28 NOTE — ED Provider Notes (Signed)
  Medical screening examination/treatment/procedure(s) were performed by non-physician practitioner and as supervising physician I was immediately available for consultation/collaboration.    Gerhard Munch, MD 01/28/13 [phone]

## 2013-01-28 NOTE — ED Provider Notes (Signed)
History     CSN: [id number]  Arrival date & time 01/28/13  1948   First MD Initiated Contact with Patient 01/28/13 2155      Chief Complaint  Patient presents with  . Dental Pain    (Consider location/radiation/quality/duration/timing/severity/associated sxs/prior treatment) HPI  Taylor Ochoa is a 57 y.o. male with chronic dental pain worsening significantly over the last 24 hours in the left lower jaw. 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 Patient has been taking ibuprofen with some relief however it is very hard on his stomach. He denies fever, trismus, difficulty swallowing.   History reviewed. No pertinent past medical history.  Past Surgical History  Procedure Laterality Date  . Leg surgery      rod placed in left lef 12/2009  . Wrist surgery      No family history on file.  History  Substance Use Topics  . Smoking status: Current Every Day Smoker -- 1.00 packs/day    Types: Cigarettes  . Smokeless tobacco: Not on file  . Alcohol Use: Yes      Review of Systems  Constitutional: Negative for fever, chills, activity change and appetite change.  HENT: Positive for dental problem. Negative for sore throat, drooling, mouth sores and trouble swallowing.   Respiratory: Negative for shortness of breath.   Cardiovascular: Negative for chest pain.  Gastrointestinal: Negative for nausea, vomiting, diarrhea and constipation.  Musculoskeletal: Negative for myalgias.  Skin: Negative for rash.  All other systems reviewed and are negative.    Allergies  Review of patient's allergies indicates no known allergies.  Home Medications   Current Outpatient Rx  Name  Route  Sig  Dispense  Refill  . ibuprofen (ADVIL,MOTRIN) 600 MG tablet   Oral   Take 600 mg by mouth every 6 (six) hours as needed for pain.         Marland Kitchen amoxicillin (AMOXIL) 500 MG capsule   Oral   Take 1 capsule (500 mg total) by  mouth 3 (three) times daily.   30 capsule   0   . oxyCODONE-acetaminophen (PERCOCET/ROXICET) 5-325 MG per tablet      1 to 2 tabs PO q6hrs  PRN for pain   15 tablet   0     BP 136/86  Pulse 74  Temp(Src) 98.8 F (37.1 C) (Oral)  Resp 17  Ht 5\' 9"  (1.753 m)  Wt 145 lb (65.772 kg)  BMI 21.4 kg/m2  SpO2 98%  Physical Exam  Nursing note and vitals reviewed. Constitutional: He is oriented to person, place, and time. He appears well-developed and well-nourished. No distress.  HENT:  Head: Normocephalic.  Mouth/Throat: Oropharynx is clear and moist.  Severely eroded dental caries down into the gum line on all teeth, no gingival swelling, erythema or tenderness to palpation. Patient is handling their secretions. There is no tenderness to palpation or firmness underneath tongue bilaterally. No trismus.   Eyes: Conjunctivae and EOM are normal. Pupils are equal, round, and reactive to light.  Neck: Normal range of motion.  Cardiovascular: Normal rate.   Pulmonary/Chest: Effort normal. No stridor.  Abdominal: Soft.  Musculoskeletal: Normal range of motion.  Neurological: He is alert and oriented to person, place, and time.  Psychiatric: He has a normal mood and affect.    ED Course  Procedures (including critical care time)  Labs Reviewed - No data to display No results found.  1. Dental caries       MDM   Taylor Ochoa is a 57 y.o. male with severe dental caries, no signs of infection, no concern for Ludwig angina. Pain medication and antibiotics given prophylactically.   Filed Vitals:   01/28/13 1959  BP: 136/86  Pulse: 74  Temp: 98.8 F (37.1 C)  TempSrc: Oral  Resp: 17  Height: 5\' 9"  (1.753 m)  Weight: 145 lb (65.772 kg)  SpO2: 98%     VSS and patient is appropriate for, and amenable to, discharge at this time. Pt verbalized understanding and agrees with care plan. Outpatient follow-up and return precautions given.    New Prescriptions   AMOXICILLIN  (AMOXIL) 500 MG CAPSULE    Take 1 capsule (500 mg total) by mouth 3 (three) times daily.   OXYCODONE-ACETAMINOPHEN (PERCOCET/ROXICET) 5-325 MG PER TABLET    1 to 2 tabs PO q6hrs  PRN for pain           Wynetta Emery, PA-C 01/28/13 2210

## 2013-02-13 ENCOUNTER — Emergency Department (HOSPITAL_COMMUNITY)
Admission: EM | Admit: 2013-02-13 | Discharge: 2013-02-13 | Disposition: A | Discharge status: Home / Self Care | Payer: Self-pay | Attending: Emergency Medicine | Admitting: Emergency Medicine

## 2013-02-13 ENCOUNTER — Emergency Department (HOSPITAL_COMMUNITY): Payer: Self-pay

## 2013-02-13 DIAGNOSIS — S99929A Unspecified injury of unspecified foot, initial encounter: Secondary | ICD-10-CM | POA: Insufficient documentation

## 2013-02-13 DIAGNOSIS — Z9889 Other specified postprocedural states: Secondary | ICD-10-CM | POA: Insufficient documentation

## 2013-02-13 DIAGNOSIS — Y92009 Unspecified place in unspecified non-institutional (private) residence as the place of occurrence of the external cause: Secondary | ICD-10-CM | POA: Insufficient documentation

## 2013-02-13 DIAGNOSIS — S8990XA Unspecified injury of unspecified lower leg, initial encounter: Secondary | ICD-10-CM | POA: Insufficient documentation

## 2013-02-13 DIAGNOSIS — Y9389 Activity, other specified: Secondary | ICD-10-CM | POA: Insufficient documentation

## 2013-02-13 DIAGNOSIS — S8992XA Unspecified injury of left lower leg, initial encounter: Secondary | ICD-10-CM

## 2013-02-13 DIAGNOSIS — R296 Repeated falls: Secondary | ICD-10-CM | POA: Insufficient documentation

## 2013-02-13 DIAGNOSIS — F17200 Nicotine dependence, unspecified, uncomplicated: Secondary | ICD-10-CM | POA: Insufficient documentation

## 2013-02-13 DIAGNOSIS — X500XXA Overexertion from strenuous movement or load, initial encounter: Secondary | ICD-10-CM | POA: Insufficient documentation

## 2013-02-13 IMAGING — CR DG KNEE COMPLETE 4+V*L*
4 series · 4 of 4 positions shown · non-contrast
Comparison: Left knee radiographs performed [DATE]

CLINICAL DATA: Injury to left knee while climbing down stairs from
porch; twisting motion, with numbness at the left knee.

LEFT KNEE - COMPLETE 4+ VIEW

[t knee ap left]
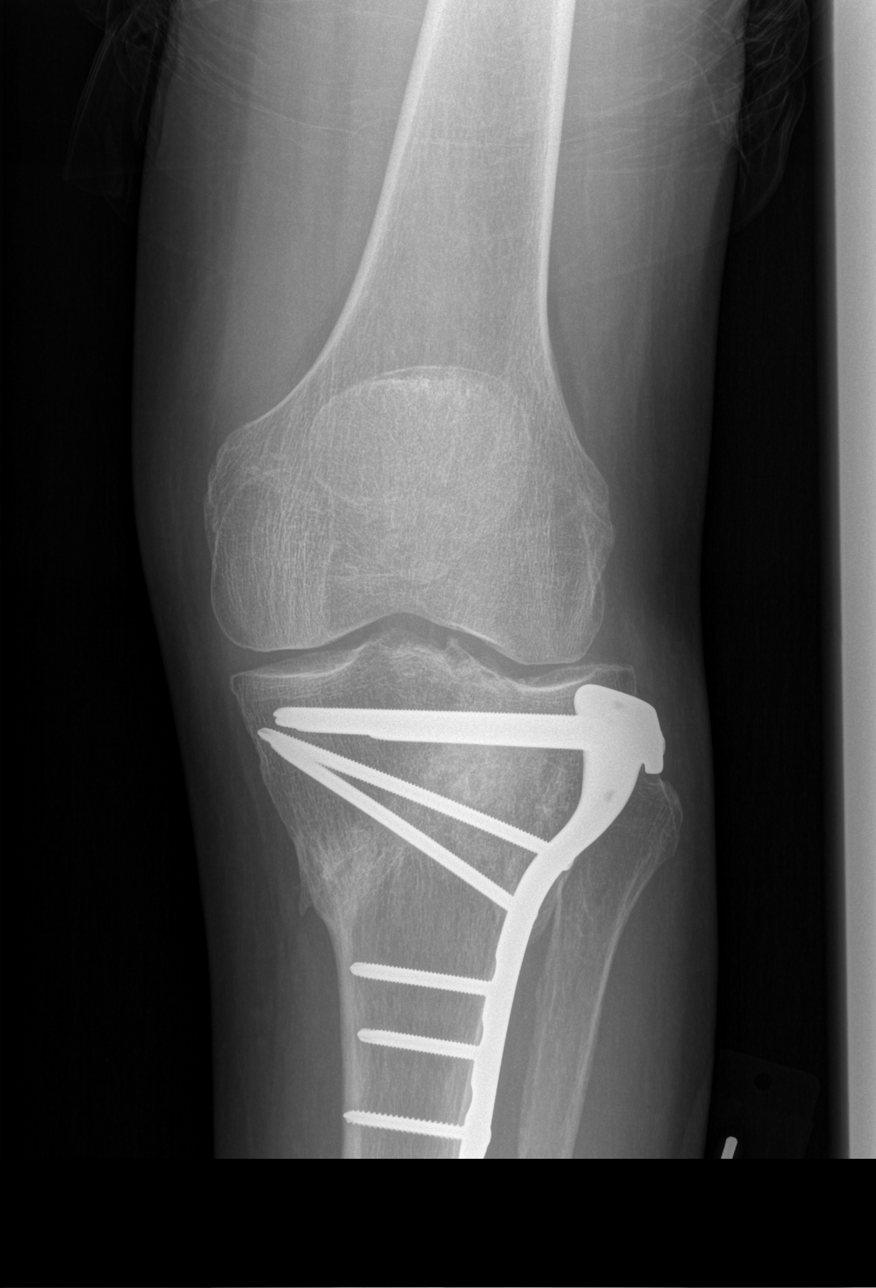

[t knee oblique left (1 of 2)]
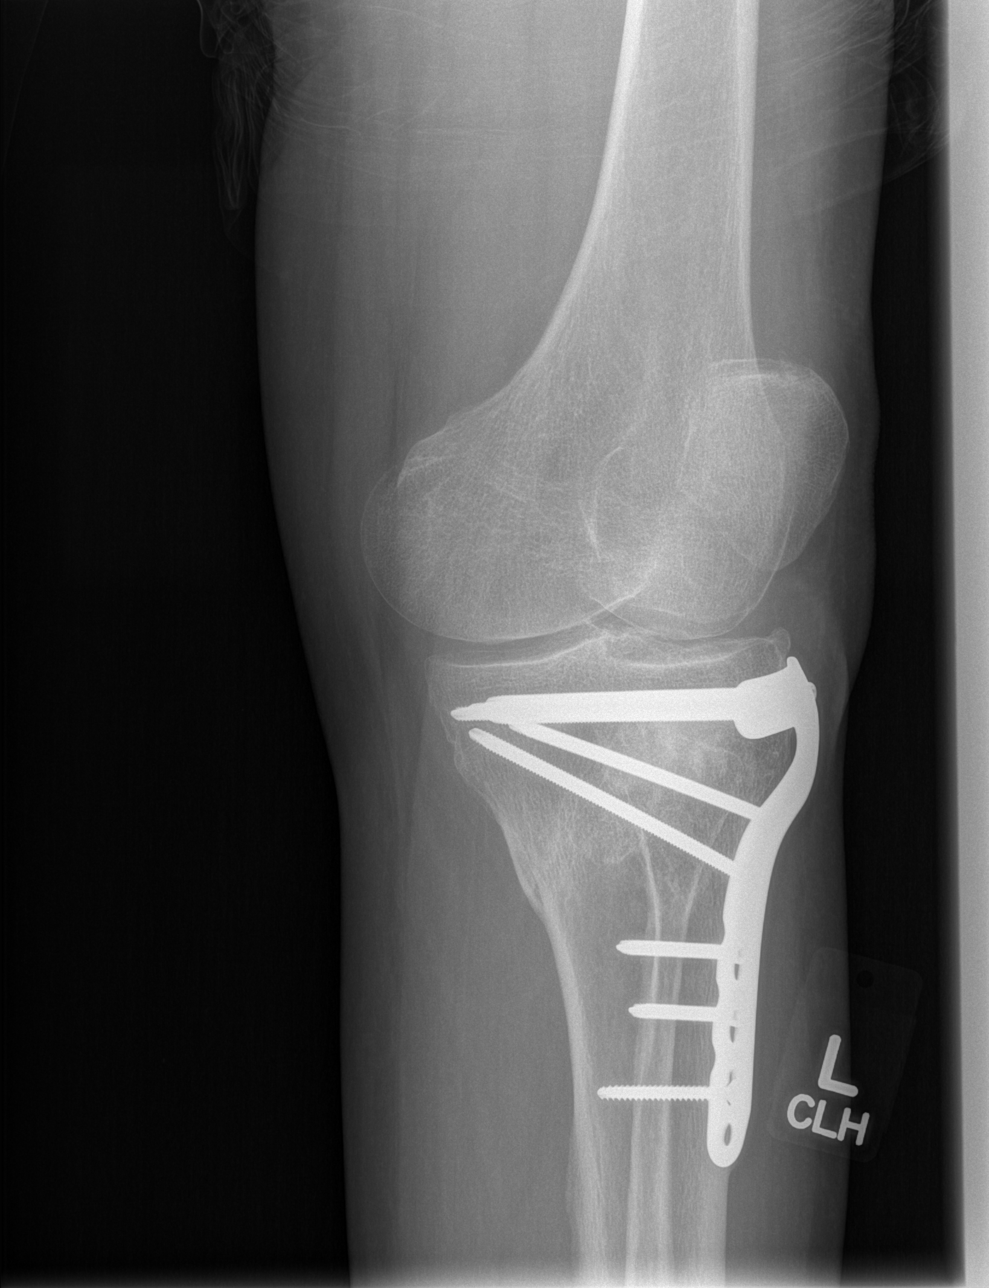

[t knee oblique left (2 of 2)]
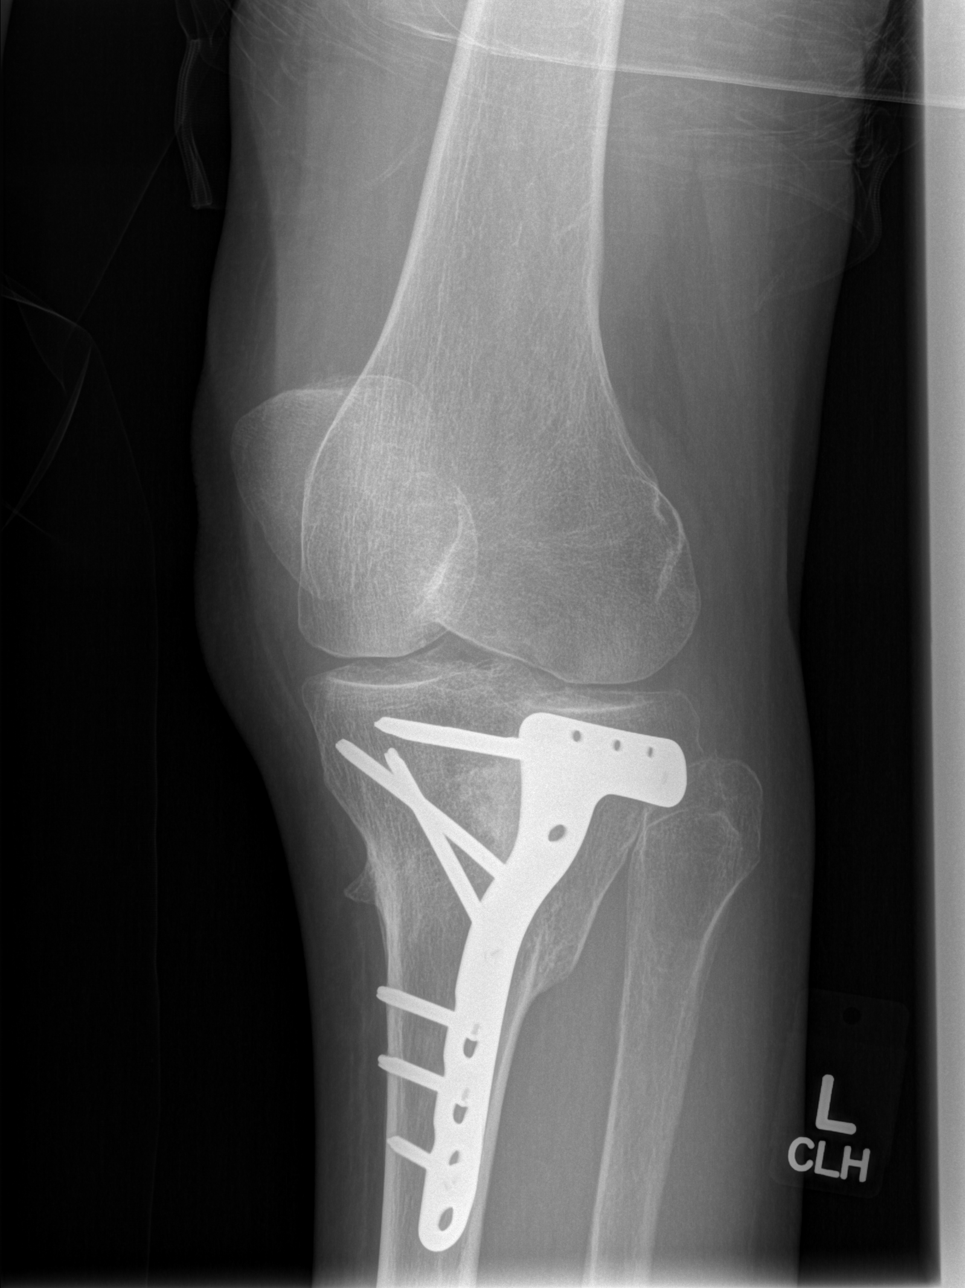

[t knee lat left]
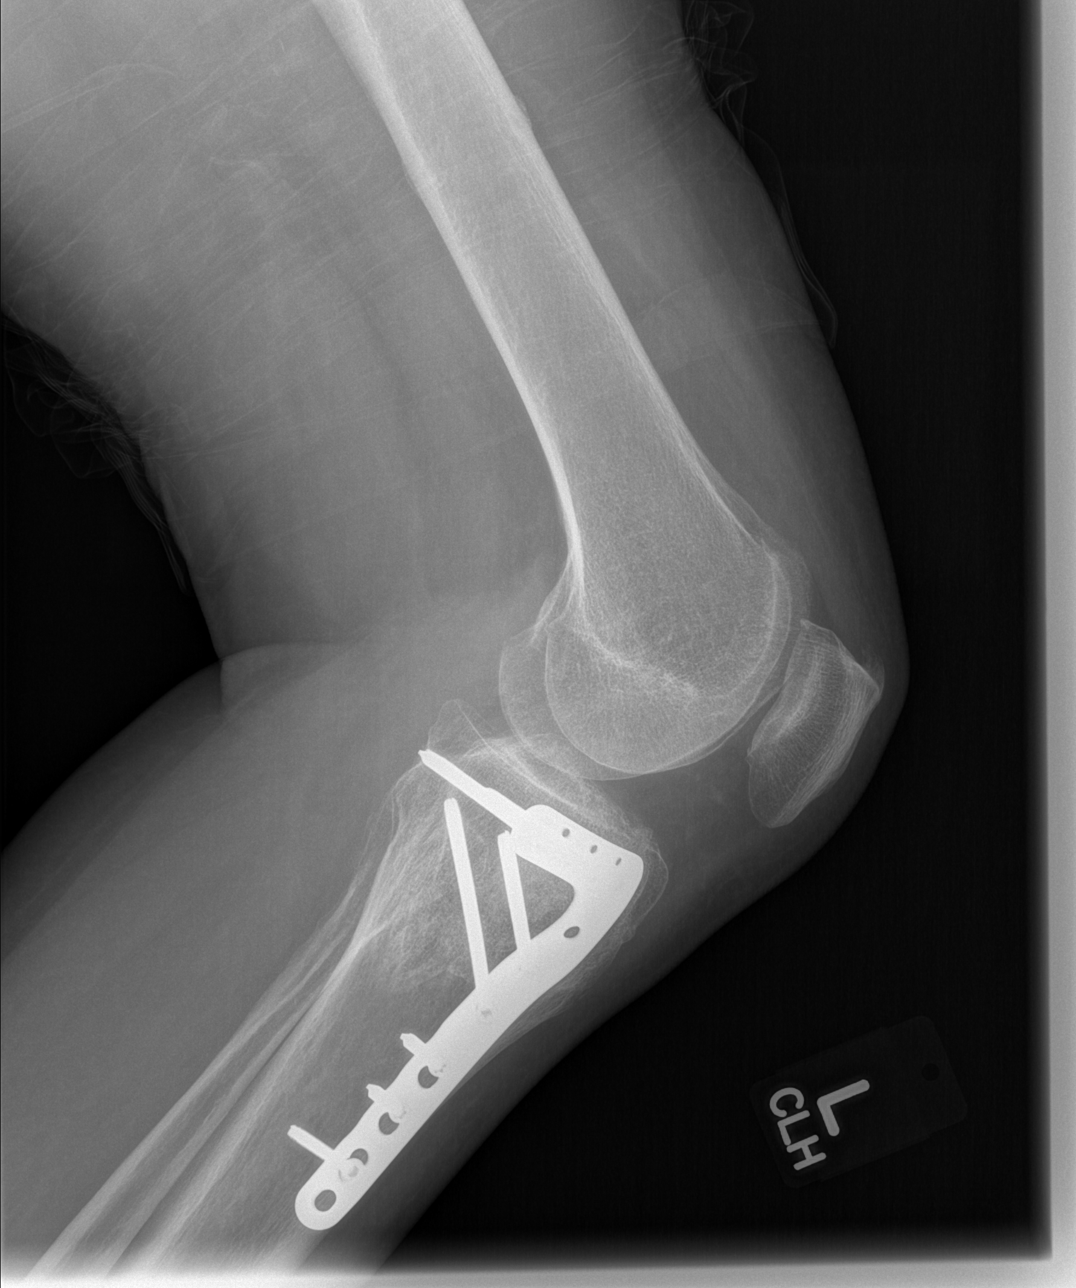

[4 of 4 positions shown; findings below may reference images not displayed]

FINDINGS: There is no evidence of fracture or dislocation.
Proximal tibial hardware is grossly unchanged in appearance,
without evidence of loosening or new fracture.  The joint spaces
are preserved.  No significant degenerative change is seen; the
patellofemoral joint is grossly unremarkable in appearance.

No significant joint effusion is seen.  The visualized soft tissues
are normal in appearance.
IMPRESSION: No evidence of fracture or dislocation.  Visualized hardware
appears intact, without evidence of loosening.

If the patient's symptoms persist, and if there are no
contraindications to MRI, MRI could be considered to exclude
internal derangement of the knee.

## 2013-02-13 MED ORDER — TRAMADOL HCL 50 MG PO TABS: 50.0000 mg | ORAL_TABLET | Freq: Four times a day (QID) | ORAL | Status: DC | PRN

## 2013-02-13 NOTE — ED Provider Notes (Signed)
History     CSN: [id number]  Arrival date & time 02/13/13  2124   First MD Initiated Contact with Patient 02/13/13 2144      Chief Complaint  Patient presents with  . Leg Pain    (Consider location/radiation/quality/duration/timing/severity/associated sxs/prior treatment) HPI Comments: Patient is a 57 year old male who presents with left knee pain that started this evening after he fell off his deck and landed on his knee. The pain started suddenly and progressively worsened since the onset. The pain is aching and severe without radiation. Patient did not try anything for pain. Movement and weight bearing activity makes the pain worse. Nothing makes the pain better. Patient reports associated swelling. Patient denies any other injury.   Patient is a 57 y.o. male presenting with leg pain.  Leg Pain   No past medical history on file.  Past Surgical History  Procedure Laterality Date  . Leg surgery      rod placed in left lef 12/2009  . Wrist surgery      No family history on file.  History  Substance Use Topics  . Smoking status: Current Every Day Smoker -- 1.00 packs/day    Types: Cigarettes  . Smokeless tobacco: Not on file  . Alcohol Use: Yes      Review of Systems  Musculoskeletal: Positive for arthralgias.  All other systems reviewed and are negative.    Allergies  Review of patient's allergies indicates no known allergies.  Home Medications   Current Outpatient Rx  Name  Route  Sig  Dispense  Refill  . ibuprofen (ADVIL,MOTRIN) 200 MG tablet   Oral   Take 400 mg by mouth every 6 (six) hours as needed for pain.           BP 135/99  Pulse 78  Temp(Src) 98.3 F (36.8 C) (Oral)  Resp 16  SpO2 99%  Physical Exam  Nursing note and vitals reviewed. Constitutional: He is oriented to person, place, and time. He appears well-developed and well-nourished. No distress.  HENT:  Head: Normocephalic and atraumatic.  Eyes: Conjunctivae are normal.   Cardiovascular: Normal rate and regular rhythm.  Exam reveals no gallop and no friction rub.   No murmur heard. Pulmonary/Chest: Effort normal and breath sounds normal. He has no wheezes. He has no rales. He exhibits no tenderness.  Abdominal: Soft. There is no tenderness.  Musculoskeletal: Normal range of motion.  Lateral left knee tenderness to palpation. No obvious deformity. No edema noted.   Neurological: He is alert and oriented to person, place, and time. Coordination normal.  Sensation equal and intact bilaterally of lower extremities. Speech is goal-oriented. Moves limbs without ataxia.   Skin: Skin is warm and dry.  Psychiatric: He has a normal mood and affect. His behavior is normal.    ED Course  Procedures (including critical care time)  Labs Reviewed - No data to display Dg Knee Complete 4 Views Left  02/13/2013   *RADIOLOGY REPORT*  Clinical Data: Injury to left knee while climbing down stairs from porch; twisting motion, with numbness at the left knee.  LEFT KNEE - COMPLETE 4+ VIEW  Comparison: Left knee radiographs performed 06/07/2012  Findings: There is no evidence of fracture or dislocation. Proximal tibial hardware is grossly unchanged in appearance, without evidence of loosening or new fracture.  The joint spaces are preserved.  No significant degenerative change is seen; the patellofemoral joint is grossly unremarkable in appearance.  No significant joint effusion is seen.  The visualized soft tissues are normal in appearance.  IMPRESSION: No evidence of fracture or dislocation.  Visualized hardware appears intact, without evidence of loosening.  If the patient's symptoms persist, and if there are no contraindications to MRI, MRI could be considered to exclude internal derangement of the knee.   Original Report Authenticated By: Tonia Ghent, M.D.     1. Left knee injury, initial encounter       MDM  10:57 PM Xray of left knee pending.   11:43 PM Xray  unremarkable for acute changes. No neurovascular compromise. Patient instructed to take tramadol as needed for pain. Patient instructed to follow up with Orthopedic doctor for further evaluation.       Emilia Beck, PA-C 02/13/13 2347   Medical screening examination/treatment/procedure(s) were performed by non-physician practitioner and as supervising physician I was immediately available for consultation/collaboration.   Derwood Kaplan, MD 02/14/13 [phone]

## 2013-02-13 NOTE — ED Notes (Signed)
Pt states he fell off deck and landed on his left knee. Sensation present skin warm and dry, color pink, and pulses present.

## 2013-02-13 NOTE — ED Notes (Addendum)
Pt states he stepped off of his deck at home and twisted his left knee. Pt states he has 5/10 pain at rest but when he ambulates it gets worse.

## 2013-02-28 ENCOUNTER — Encounter (HOSPITAL_COMMUNITY): Payer: Self-pay | Admitting: *Deleted

## 2013-02-28 ENCOUNTER — Emergency Department (HOSPITAL_COMMUNITY)
Admission: EM | Admit: 2013-02-28 | Discharge: 2013-02-28 | Disposition: A | Discharge status: Home / Self Care | Payer: Self-pay | Attending: Emergency Medicine | Admitting: Emergency Medicine

## 2013-02-28 DIAGNOSIS — F17200 Nicotine dependence, unspecified, uncomplicated: Secondary | ICD-10-CM | POA: Insufficient documentation

## 2013-02-28 DIAGNOSIS — Z8739 Personal history of other diseases of the musculoskeletal system and connective tissue: Secondary | ICD-10-CM | POA: Insufficient documentation

## 2013-02-28 DIAGNOSIS — K047 Periapical abscess without sinus: Secondary | ICD-10-CM | POA: Insufficient documentation

## 2013-02-28 MED ORDER — PENICILLIN V POTASSIUM 500 MG PO TABS: 500.0000 mg | ORAL_TABLET | Freq: Four times a day (QID) | ORAL | Status: DC

## 2013-02-28 MED ORDER — IBUPROFEN 800 MG PO TABS: 800.0000 mg | ORAL_TABLET | Freq: Three times a day (TID) | ORAL | Status: DC | PRN

## 2013-02-28 MED ORDER — HYDROCODONE-ACETAMINOPHEN 5-325 MG PO TABS: 1.0000 | ORAL_TABLET | Freq: Four times a day (QID) | ORAL | Status: DC | PRN

## 2013-02-28 NOTE — ED Notes (Signed)
Reports left lower dental pain since last night. Airway intact.

## 2013-02-28 NOTE — ED Provider Notes (Signed)
Medical screening examination/treatment/procedure(s) were performed by non-physician practitioner and as supervising physician I was immediately available for consultation/collaboration.   Glynn Octave, MD 02/28/13 [phone]

## 2013-02-28 NOTE — ED Provider Notes (Signed)
History     CSN: [id number]  Arrival date & time 02/28/13  1033   First MD Initiated Contact with Patient 02/28/13 1053      Chief Complaint  Patient presents with  . Dental Pain    (Consider location/radiation/quality/duration/timing/severity/associated sxs/prior treatment) HPI Patient presents to the emergency department with dental pain, along the left lower teeth.  Patient, states he woke this morning with swelling to his lower jaw.  Patient, states, that he has had previous abscesses in his mouth patient, states that tooth pulled on the left side, several weeks ago.  Patient denies chest pain, shortness of breath, difficulty breathing, difficulty swallowing, neck swelling, headache, blurred vision, fever, or rash.  Patient, states, that the patient, make his pain, worse.  Patient, states pain has been constant since the swelling started.  Patient, states, that the symptoms and been ongoing with dental pain for several days. History reviewed. No pertinent past medical history.  Past Surgical History  Procedure Laterality Date  . Leg surgery      rod placed in left lef 12/2009  . Wrist surgery      History reviewed. No pertinent family history.  History  Substance Use Topics  . Smoking status: Current Every Day Smoker -- 1.00 packs/day    Types: Cigarettes  . Smokeless tobacco: Not on file  . Alcohol Use: Yes      Review of Systems All other systems negative except as documented in the HPI. All pertinent positives and negatives as reviewed in the HPI. Allergies  Review of patient's allergies indicates no known allergies.  Home Medications   Current Outpatient Rx  Name  Route  Sig  Dispense  Refill  . ibuprofen (ADVIL,MOTRIN) 200 MG tablet   Oral   Take 600 mg by mouth every 6 (six) hours as needed for pain.            BP 136/86  Pulse 68  Temp(Src) 98 F (36.7 C) (Oral)  Resp 18  Ht 5\' 9"  (1.753 m)  Wt 145 lb (65.772 kg)  BMI 21.4 kg/m2  SpO2  98%  Physical Exam  Nursing note and vitals reviewed. Constitutional: He is oriented to person, place, and time. He appears well-developed and well-nourished. No distress.  HENT:  Head: Normocephalic and atraumatic.  She has very poor dentition with multiple extensively decayed teeth.  He also has many teeth.  That have been pulled or are missing.  Patient has swelling along the left lower jaw line with no obvious abscess noted, on exam.  Eyes: Pupils are equal, round, and reactive to light.  Neck: Normal range of motion. Neck supple. No tracheal deviation present.  Cardiovascular: Normal rate and regular rhythm.   Pulmonary/Chest: Effort normal and breath sounds normal. No stridor.  Lymphadenopathy:    He has no cervical adenopathy.  Neurological: He is alert and oriented to person, place, and time.  Skin: Skin is warm and dry.    ED Course  Procedures (including critical care time)  Patient will be referred to dentistry is also advised to rinse with warm water and peroxide 3 times a day.  He is told to use heat over the area of swelling.  Also advised him to return here as needed MDM          Carlyle Dolly, PA-C 02/28/13 1130

## 2013-04-22 ENCOUNTER — Emergency Department (HOSPITAL_COMMUNITY)
Admission: EM | Admit: 2013-04-22 | Discharge: 2013-04-22 | Disposition: A | Discharge status: Home / Self Care | Payer: Self-pay | Attending: Emergency Medicine | Admitting: Emergency Medicine

## 2013-04-22 ENCOUNTER — Encounter (HOSPITAL_COMMUNITY): Payer: Self-pay | Admitting: Adult Health

## 2013-04-22 DIAGNOSIS — M25531 Pain in right wrist: Secondary | ICD-10-CM

## 2013-04-22 DIAGNOSIS — F17200 Nicotine dependence, unspecified, uncomplicated: Secondary | ICD-10-CM | POA: Insufficient documentation

## 2013-04-22 DIAGNOSIS — R209 Unspecified disturbances of skin sensation: Secondary | ICD-10-CM | POA: Insufficient documentation

## 2013-04-22 DIAGNOSIS — Z9889 Other specified postprocedural states: Secondary | ICD-10-CM | POA: Insufficient documentation

## 2013-04-22 DIAGNOSIS — R202 Paresthesia of skin: Secondary | ICD-10-CM

## 2013-04-22 DIAGNOSIS — M25539 Pain in unspecified wrist: Secondary | ICD-10-CM | POA: Insufficient documentation

## 2013-04-22 MED ORDER — TRAMADOL HCL 50 MG PO TABS
50.0000 mg | ORAL_TABLET | Freq: Once | ORAL | Status: AC
  Administered 2013-04-22: 50 mg via ORAL
  Filled 2013-04-22: qty 1

## 2013-04-22 MED ORDER — TRAMADOL HCL 50 MG PO TABS: 50.0000 mg | ORAL_TABLET | Freq: Four times a day (QID) | ORAL | Status: DC | PRN
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
## 2013-04-22 NOTE — ED Notes (Signed)
Presents with right lower arm pain, intermittent numbness from the lower fore arm to to middle of palm. Bilateral hands are cold. Numbness and coldness comes and goes. Began today while at work. +2 radial pulse, able to wiggle digits well.

## 2013-04-22 NOTE — ED Notes (Signed)
PA at bedside.

## 2013-04-22 NOTE — ED Provider Notes (Signed)
CSN: [id number]     Arrival date & time 04/22/13  2121 History     First MD Initiated Contact with Patient 04/22/13 2156     Chief Complaint  Patient presents with  . Arm Pain   (Consider location/radiation/quality/duration/timing/severity/associated sxs/prior Treatment) HPI  Taylor Ochoa is a 57 y.o. male complaining of right wrist pain with intermittent pins and needles paresthesia starting while he was working at [employer] today. Patient has prior history of open reduction internal fixation by Dr. Orlan Leavens in her motor past. No complications from surgery. There is no trauma. Patient is right handed and has repetitive motion at work quite often. Patient denies reduced range of motion.    History reviewed. No pertinent past medical history. Past Surgical History  Procedure Laterality Date  . Leg surgery      rod placed in left lef 12/2009  . Wrist surgery     History reviewed. No pertinent family history. History  Substance Use Topics  . Smoking status: Current Every Day Smoker -- 1.00 packs/day    Types: Cigarettes  . Smokeless tobacco: Not on file  . Alcohol Use: Yes    Review of Systems 10 systems reviewed and found to be negative, except as noted in the HPI   Allergies  Review of patient's allergies indicates no known allergies.  Home Medications   Current Outpatient Rx  Name  Route  Sig  Dispense  Refill  . ibuprofen (ADVIL,MOTRIN) 200 MG tablet   Oral   Take 400 mg by mouth daily as needed for pain. For hand pain          BP 136/95  Pulse 70  Temp(Src) 98.1 F (36.7 C) (Oral)  Resp 16  SpO2 96% Physical Exam  Nursing note and vitals reviewed. Constitutional: He is oriented to person, place, and time. He appears well-developed and well-nourished. No distress.  HENT:  Head: Normocephalic.  Eyes: Conjunctivae and EOM are normal.  Cardiovascular: Normal rate.   Pulmonary/Chest: Effort normal. No stridor.  Abdominal: Soft.  Musculoskeletal: Normal  range of motion.  Well-healed surgical incision to volar left wrist. Excellent range of motion to wrist elbow and all digits. Radial pulse is 2+. Cap refill is less than 2 seconds x5 digits. Distal sensation and can differentiate between pinprick and light touch.  Neurological: He is alert and oriented to person, place, and time.  Psychiatric: He has a normal mood and affect.    ED Course   Procedures (including critical care time)  Labs Reviewed - No data to display No results found. 1. Right wrist pain   2. Paresthesia     MDM   Filed Vitals:   04/22/13 2130  BP: 136/95  Pulse: 70  Temp: 98.1 F (36.7 C)  TempSrc: Oral  Resp: 16  SpO2: 96%     Taylor Ochoa is a 57 y.o. male with atraumatic pain and paresthesia to right wrist with remote history of fracture and internal fixation. Physical exam is reassuring with excellent range of motion and neurovascular status intact. Patient is asked to follow with orthopedic surgery  Medications  traMADol (ULTRAM) tablet 50 mg (50 mg Oral Given 04/22/13 2232)    Pt is hemodynamically stable, appropriate for, and amenable to discharge at this time. Pt verbalized understanding and agrees with care plan. All questions answered. Outpatient follow-up and specific return precautions discussed.    Discharge Medication List as of 04/22/2013 10:20 PM    START taking these medications  Details  traMADol (ULTRAM) 50 MG tablet Take 1 tablet (50 mg total) by mouth every 6 (six) hours as needed for pain., Starting 04/22/2013, Until Discontinued, Print        Note: Portions of this report may have been transcribed using voice recognition software. Every effort was made to ensure accuracy; however, inadvertent computerized transcription errors may be present    Wynetta Emery, PA-C 04/23/13 [phone]

## 2013-04-23 NOTE — ED Provider Notes (Signed)
Medical screening examination/treatment/procedure(s) were performed by non-physician practitioner and as supervising physician I was immediately available for consultation/collaboration.  Doug Sou, MD 04/23/13 [phone]

## 2013-05-07 ENCOUNTER — Encounter (HOSPITAL_COMMUNITY): Payer: Self-pay | Admitting: *Deleted

## 2013-05-07 ENCOUNTER — Emergency Department (HOSPITAL_COMMUNITY)
Admission: EM | Admit: 2013-05-07 | Discharge: 2013-05-07 | Disposition: A | Discharge status: Home / Self Care | Payer: Self-pay | Attending: Emergency Medicine | Admitting: Emergency Medicine

## 2013-05-07 DIAGNOSIS — F17200 Nicotine dependence, unspecified, uncomplicated: Secondary | ICD-10-CM | POA: Insufficient documentation

## 2013-05-07 DIAGNOSIS — K089 Disorder of teeth and supporting structures, unspecified: Secondary | ICD-10-CM | POA: Insufficient documentation

## 2013-05-07 DIAGNOSIS — K0889 Other specified disorders of teeth and supporting structures: Secondary | ICD-10-CM

## 2013-05-07 MED ORDER — TRAMADOL HCL 50 MG PO TABS: 50.0000 mg | ORAL_TABLET | Freq: Four times a day (QID) | ORAL | Status: DC | PRN

## 2013-05-07 MED ORDER — PENICILLIN V POTASSIUM 250 MG PO TABS
500.0000 mg | ORAL_TABLET | Freq: Once | ORAL | Status: AC
  Administered 2013-05-07: 500 mg via ORAL
  Filled 2013-05-07: qty 2

## 2013-05-07 MED ORDER — PENICILLIN V POTASSIUM 500 MG PO TABS: 500.0000 mg | ORAL_TABLET | Freq: Four times a day (QID) | ORAL | Status: DC

## 2013-05-07 MED ORDER — TRAMADOL HCL 50 MG PO TABS
50.0000 mg | ORAL_TABLET | Freq: Once | ORAL | Status: AC
  Administered 2013-05-07: 50 mg via ORAL
  Filled 2013-05-07: qty 1

## 2013-05-07 NOTE — ED Notes (Signed)
The pt has had a toothache all day 

## 2013-05-07 NOTE — ED Notes (Signed)
Patient reports toothache since 2 pm this afternoon after eating a sandwich. States that a part of his left upper tooth broke off. Patient has several missing teeth throughout the mouth. Poor dentition with obvious decay and caries.

## 2013-05-07 NOTE — ED Provider Notes (Signed)
CSN: [id number]     Arrival date & time 05/07/13  2034 History    This chart was scribed for Emilia Beck, PA working with Shon Baton, MD by Quintella Reichert, ED Scribe. This patient was seen in room TR05C/TR05C and the patient's care was started at 10:00 PM.     Chief Complaint  Patient presents with  . Dental Pain    Patient is a 57 y.o. male presenting with tooth pain. The history is provided by the patient. No language interpreter was used.  Dental Pain Toothache location: Upper left. Severity:  Moderate (6/10) Onset quality:  Sudden Duration:  8 hours Timing:  Constant Chronicity:  New Context comment:  Cracked a tooth Relieved by: Tramadol given in ED. Worsened by:  Touching and pressure Ineffective treatments:  None tried Associated symptoms: no fever   Risk factors: lack of dental care     HPI Comments: Taylor Ochoa is a 57 y.o. male who presents to the Emergency Department complaining of waxing-and-waning moderate left-sided dental pain that began 8 hours ago when a tooth broke off in that area.  Pain is described as an aching pain at a severity of 6/10 that radiates to his left forehead.  He was given tramadol in ED prior to evaluation and presently he states his pain is significantly improved.  He did not take any other medications.  He denies fever.  Pt states that his dental insurance went through today so he is planning to make an appointment.   History reviewed. No pertinent past medical history.   Past Surgical History  Procedure Laterality Date  . Leg surgery      rod placed in left lef 12/2009  . Wrist surgery      No family history on file.   History  Substance Use Topics  . Smoking status: Current Every Day Smoker -- 1.00 packs/day    Types: Cigarettes  . Smokeless tobacco: Not on file  . Alcohol Use: Yes     Review of Systems  Constitutional: Negative for fever.  HENT: Positive for dental problem.   All other systems reviewed  and are negative.      Allergies  Review of patient's allergies indicates no known allergies.  Home Medications   Current Outpatient Rx  Name  Route  Sig  Dispense  Refill  . ibuprofen (ADVIL,MOTRIN) 200 MG tablet   Oral   Take 400 mg by mouth daily as needed for pain. For hand pain         . traMADol (ULTRAM) 50 MG tablet   Oral   Take 1 tablet (50 mg total) by mouth every 6 (six) hours as needed for pain.   15 tablet   0    BP 123/83  Pulse 66  Temp(Src) 98.3 F (36.8 C)  Resp 18  SpO2 97%  Physical Exam  Nursing note and vitals reviewed. Constitutional: He is oriented to person, place, and time. He appears well-developed and well-nourished. No distress.  HENT:  Head: Normocephalic and atraumatic.  Mouth/Throat:    Adentulous except for a few scattered remaining teeth that are decayed. Poor dentition.  Eyes: EOM are normal.  Neck: Neck supple. No tracheal deviation present.  Cardiovascular: Normal rate.   Pulmonary/Chest: Effort normal. No respiratory distress.  Musculoskeletal: Normal range of motion.  Neurological: He is alert and oriented to person, place, and time.  Skin: Skin is warm and dry.  Psychiatric: He has a normal mood and affect. His behavior  is normal.    ED Course  Procedures (including critical care time)  DIAGNOSTIC STUDIES: Oxygen Saturation is 97% on room air, normal by my interpretation.    COORDINATION OF CARE: 10:03 PM-Discussed treatment plan which includes pain medication, antibiotics and f/u with dentist with pt at bedside and pt agreed to plan.    Labs Reviewed - No data to display No results found.  1. Pain, dental     MDM  11:20 PM Patient will have tramadol and veetid for pain and possible infection. Patient will follow up with his dentist. Vitals stable and patient afebrile.     I personally performed the services described in this documentation, which was scribed in my presence. The recorded information has  been reviewed and is accurate.    Emilia Beck, PA-C 05/07/13 2322

## 2013-05-07 NOTE — Discharge Instructions (Signed)
Take Veetid as directed until gone. Take Tramadol as needed for pain. Follow up with your dentist for further evaluation and management. Refer to attached documents for more information. Follow up with a dentist on the resource guide below as needed.   RESOURCE GUIDE  Chronic Pain Problems: Contact [facility]  [phone] Patients need to be referred by their primary care doctor.  Insufficient Money for Medicine: Contact United Way:  call [phone]  No Primary Care Doctor: - Call Health Connect  [phone] - can help you locate a primary care doctor that  accepts your insurance, provides certain services, etc. - Physician Referral Service- [phone]  Agencies that provide inexpensive medical care: - [facility]  [phone] - [facility]  [phone] - [facility]  [phone] - Women's Clinic  [phone] - Planned Parenthood  [phone] [facility]  [phone]  Medicaid-accepting [facility] Providers: - [facility]- [address]  [phone], Mon-Fri 9am-7pm, Sat 9am-1pm - [facility]- [address] [place], Suite [place]  [phone] - [facility]- [address] [place]  [phone] [facility]- [address]  [phone] - Renaye Rakers- [address] [place], [address], [phone]  Only accepts [place] patients after they have their name  applied to their card  Self Pay (no insurance) in [place]: - Sickle Cell Patients - [facility]  [address] [place], [phone] - [facility]- [address] [place]  [phone]       [facility]- [address]       -     [facility]- see information above (Speak to [employer] if you do not have insurance)       -  [facility]- [address],  [phone]       -  [facility]- [address], [phone]       -  Dr Julio Sicks-  [address], [place], [phone]       -  [facility] - [address] [phone]       -  [facility]- [address], [phone], also [address] [phone]       -     [facility]- [address] [place], [phone], 1st & 3rd Saturday         every month, 10am-1pm  -     [facility]   [address] [place], [place].   Phone:  [phone], Fax:  [phone]. Hours of Operation:  9 am - 6 pm, M-F.  -     [facility]   [address], [place]   Phone: [phone], Fax: [phone]. Hours of Operation:  8:30 am - 5:30 pm, M-F.  [facility] [address] [place], [place] [address] [phone]  [facility] [address] [place], [place] [address] [phone]  1) Find a Doctor and Pay Out of Pocket Although you won't have to find out who is covered by your insurance plan, it is a good idea to ask around and get recommendations. You will then need to call the office and see if  the doctor you have chosen will accept you as a new patient and what types of options they offer for patients who are self-pay. Some doctors offer discounts or will set up payment plans for their patients who do not have insurance, but you will need to ask so you aren't surprised when you get to your appointment.  2) Contact Your Local Health Department Not all health departments have doctors that can see patients for sick visits, but many do, so it is worth a call to see if yours does. If you don't know where your local health department is, you can check in your phone book. The CDC also has a tool to help you locate your state's health department, and many state websites also have listings of all of their local health departments.  3) Find a Walk-in Clinic If your illness is not  likely to be very severe or complicated, you may want to try a walk in clinic. These are popping up all over the country in pharmacies, drugstores, and shopping centers. They're usually staffed by nurse practitioners or physician assistants that have been trained to treat common illnesses and complaints. They're usually fairly quick and inexpensive. However, if you have serious medical issues or chronic medical problems, these are probably not your best option  STD Testing - [facility] Department of [facility], STD Clinic, [address] [place], phone [phone] or [phone].  Monday - Friday, call for an appointment. [facility] of [employer], STD Clinic, [address], [place], phone [phone] or [phone].  Monday - Friday, call for an appointment.  Abuse/Neglect: [facility] Child Abuse Hotline 302 123 8021 [facility] Child Abuse Hotline 762 139 7610 (After Hours)  Emergency Shelter:  [facility] [phone]  Maternity Homes: - Room at the [facility] 3172908673 - [facility] [phone]  MRSA Hotline #:   (934)461-9511  Dental Assistance If unable to pay or uninsured, contact:  [facility]. to become qualified for the adult dental clinic.  Patients with Medicaid: [facility] [phone] [address], [phone] [address], [phone]  If unable to pay, or uninsured, contact [facility] [phone] in [place], [phone] in [place] [facility]) to become qualified for the adult dental clinic  [facility] [address] [place], [place] [address] [phone] [email or web address]  Other Proofreader Services: - [facility]- [address] [place], [place], [place], [address], [phone], Ext. [phone], 2nd and 4th Thursday of the month at 6:30am.  10 clients each day by appointment, can  sometimes see walk-in patients if someone does not show for an appointment. [facility]- [address] [place], [place], [address], [phone] - [facility] [address], [place], [place], [address], [phone] - [facility]- [phone] [facility] Health Department- [phone] [facility] Health [phone]       Behavioral Health Resources in the [facility]  Intensive Outpatient Programs: [facility]      [address] [place], [place] [phone] Both a day and evening program       [facility] Outpatient     [address]        [place], [place] [address] [phone]         ADS: [facility] [address] [place] [phone]  [facility] Mental Health ACCESS LINE: [phone] or [phone] [address] [place], [place] [address] [email or web address]   Substance Abuse Resources: - [facility] Drug [facility]  [phone] - [facility] [phone] - [facility] [phone] Floydene Flock [phone] - Residential & Outpatient Substance Abuse Program  [phone]  Psychological Services: [facility]  [phone] [facility] Services  [phone] - [facility], [phone] [place]. [address], [employer], ACCESS LINE: 506-828-1457 or [phone], [email or web address]  Mobile Crisis Teams:                                        Therapeutic Alternatives         Mobile Crisis Care Unit [phone]             [facility] [address] [place]inette Otto [phone]                                         Interventionist [address] [place] [address] [place] [phone]  Self-Help/Support Groups: Mental Health Assoc. of [employer] of support groups 347-393-8584 (call for  more info)  Narcotics Anonymous (NA) [facility] [address] [place] - 2 meetings at this location  Residential Treatment Programs:  [facility]      [address]        [place]       [phone]         [facility] [address], [place] [address] [place], [place]  [address] [phone]  [facility]  [address] [place], [place] [address] [phone] Admissions: 8am-3pm M-F  [facility]     [address]        [place], [place] [address]       [phone]         [facility] [address] [place], [place] [phone]  [facility] [address] [place], [place] [phone]  [facility] - Intensive Outpatient      [address]     [place], [place]       [phone]         [facility] ([facility]     [address] [place], [place] [phone] or [phone])[phone]  Residential Treatment Services (RTS), Medicaid [address] [place], [place] [phone]  Fellowship [address]                                               [address] [place] [phone]  [facility] Resources: [phone]               General Therapy                                                Angie Fava, PhD        [phone]  [place], [place] [address]         [phone]   Insurance  [facility] Behavioral   [address] [place], [place] [address] [phone]  [facility] [address] [place], [place] [address] [phone] Insurance/Medicaid/sponsorship through [facility] and [facility]                                              [address]                                        [place], [place] [address]    Therapy/tele-psych/case         [phone]          [facility] [address], [place]  [address]  Adolescent/group home/case management [phone]                                           Creola Corn PhD       General therapy       Insurance   [phone]         Dr. Lolly Mustache, Insurance, M-F [phone]  [facility]  United [employer] [facility] Dept. [address]                 [address]         [address] [place] [address]  Blondell Reveal Phone:  [phone]                                  Phone:  [phone]                   Phone:  [phone]  [facility] Mental Health, [phone] - [facility] - [facility]- [phone]       -     [facility] in [place], [address]             [phone], Insurance  [place] Child Abuse Hotline 336-530-2680 or 8484872478 (After Hours)

## 2013-05-08 NOTE — ED Provider Notes (Signed)
Medical screening examination/treatment/procedure(s) were performed by non-physician practitioner and as supervising physician I was immediately available for consultation/collaboration.  Shon Baton, MD 05/08/13 [phone]

## 2013-06-17 ENCOUNTER — Emergency Department (HOSPITAL_COMMUNITY)
Admission: EM | Admit: 2013-06-17 | Discharge: 2013-06-18 | Disposition: A | Discharge status: Home / Self Care | Payer: Self-pay | Attending: Emergency Medicine | Admitting: Emergency Medicine

## 2013-06-17 DIAGNOSIS — Z792 Long term (current) use of antibiotics: Secondary | ICD-10-CM | POA: Insufficient documentation

## 2013-06-17 DIAGNOSIS — K029 Dental caries, unspecified: Secondary | ICD-10-CM

## 2013-06-17 DIAGNOSIS — Z79899 Other long term (current) drug therapy: Secondary | ICD-10-CM | POA: Insufficient documentation

## 2013-06-17 DIAGNOSIS — F17200 Nicotine dependence, unspecified, uncomplicated: Secondary | ICD-10-CM | POA: Insufficient documentation

## 2013-06-17 MED ORDER — HYDROCODONE-ACETAMINOPHEN 5-325 MG PO TABS
1.0000 | ORAL_TABLET | Freq: Once | ORAL | Status: AC
  Administered 2013-06-18: 1 via ORAL
  Filled 2013-06-17: qty 1

## 2013-06-17 MED ORDER — PENICILLIN V POTASSIUM 500 MG PO TABS
500.0000 mg | ORAL_TABLET | Freq: Once | ORAL | Status: AC
  Administered 2013-06-18: 500 mg via ORAL
  Filled 2013-06-17: qty 1

## 2013-06-17 NOTE — ED Provider Notes (Signed)
This chart was scribed for Arman Filter NP, a non-physician practitioner working with Celene Kras, MD by Lewanda Rife, ED Scribe. This patient was seen in room WTR5/WTR5 and the patient's care was started at 2354.    CSN: [id number]     Arrival date & time 06/17/13  2345 History   First MD Initiated Contact with Patient 06/17/13 2352     No chief complaint on file.  (Consider location/radiation/quality/duration/timing/severity/associated sxs/prior Treatment) The history is provided by the patient.   HPI Comments: Taylor Ochoa is a 57 y.o. male who presents to the Emergency Department complaining of constant severe lower left quadrant dental pain onset 3 pm today. Denies any radiation of pain. Reports pain is exacerbated by touch, and eating. Denies any alleviating factors. Denies associated fever, dysphagia, otalgia, and headaches. Reports taking tramadol PTA with no relief of symptoms. Denies having a dentist.   No past medical history on file. Past Surgical History  Procedure Laterality Date  . Leg surgery      rod placed in left lef 12/2009  . Wrist surgery     No family history on file. History  Substance Use Topics  . Smoking status: Current Every Day Smoker -- 1.00 packs/day    Types: Cigarettes  . Smokeless tobacco: Not on file  . Alcohol Use: Yes    Review of Systems  Constitutional: Negative for fever.  HENT: Positive for dental problem. Negative for ear pain and sore throat.   Neurological: Negative for dizziness and headaches.  All other systems reviewed and are negative.   A complete 10 system review of systems was obtained and all systems are negative except as noted in the HPI and PMH.    Allergies  Review of patient's allergies indicates no known allergies.  Home Medications   Current Outpatient Rx  Name  Route  Sig  Dispense  Refill  . ibuprofen (ADVIL,MOTRIN) 200 MG tablet   Oral   Take 400 mg by mouth daily as needed for pain. For hand pain        . penicillin v potassium (VEETID) 500 MG tablet   Oral   Take 1 tablet (500 mg total) by mouth 4 (four) times daily.   40 tablet   0   . traMADol (ULTRAM) 50 MG tablet   Oral   Take 1 tablet (50 mg total) by mouth every 6 (six) hours as needed for pain.   15 tablet   0   . traMADol (ULTRAM) 50 MG tablet   Oral   Take 1 tablet (50 mg total) by mouth every 6 (six) hours as needed for pain.   15 tablet   0    There were no vitals taken for this visit. Physical Exam  Nursing note and vitals reviewed. Constitutional: He is oriented to person, place, and time. He appears well-developed and well-nourished. No distress.  HENT:  Head: Normocephalic and atraumatic.  Mouth/Throat: Oropharynx is clear and moist and mucous membranes are normal. No trismus in the jaw. Abnormal dentition. Dental caries present. No oropharyngeal exudate, posterior oropharyngeal edema, posterior oropharyngeal erythema or tonsillar abscesses.    Severe dental disease extending into the gumline of lower left quadrant. Lower left quadrant gingival swelling.   Pain with percussion of LLQ   Eyes: Conjunctivae and EOM are normal.  Neck: Normal range of motion. Neck supple. No tracheal deviation present.  Cardiovascular: Normal rate and regular rhythm.   No murmur heard. Pulmonary/Chest: Effort normal and breath  sounds normal. No respiratory distress. He has no wheezes.  Musculoskeletal: Normal range of motion.  Lymphadenopathy:       Head (left side): Submental adenopathy present.  Neurological: He is alert and oriented to person, place, and time.  Skin: Skin is warm and dry.  Psychiatric: He has a normal mood and affect. His behavior is normal.    ED Course  Procedures (including critical care time) Medications  penicillin v potassium (VEETID) tablet 500 mg (not administered)  HYDROcodone-acetaminophen (NORCO/VICODIN) 5-325 MG per tablet 1 tablet (not administered)    Labs Review Labs Reviewed -  No data to display Imaging Review No results found.  MDM  No diagnosis found.  Will start patient on penicillin and Norco referred him to the dental clinic, but will be held on the 26th  I personally performed the services described in this documentation, which was scribed in my presence. The recorded information has been reviewed and is accurate.   Arman Filter, NP 06/18/13 [phone]

## 2013-06-18 MED ORDER — PENICILLIN V POTASSIUM 500 MG PO TABS: 500.0000 mg | ORAL_TABLET | Freq: Four times a day (QID) | ORAL | Status: DC

## 2013-06-18 MED ORDER — HYDROCODONE-ACETAMINOPHEN 5-325 MG PO TABS: 1.0000 | ORAL_TABLET | Freq: Four times a day (QID) | ORAL | Status: DC | PRN

## 2013-06-18 NOTE — ED Notes (Signed)
Pt c/o L sided back lower dental pain. Pain since 1500 today. Pt with no acute distress. Pt states he has a ride home.

## 2013-06-18 NOTE — ED Provider Notes (Signed)
Medical screening examination/treatment/procedure(s) were performed by non-physician practitioner and as supervising physician I was immediately available for consultation/collaboration.    Celene Kras, MD 06/18/13 [phone]

## 2013-07-20 ENCOUNTER — Emergency Department (HOSPITAL_COMMUNITY)
Admission: EM | Admit: 2013-07-20 | Discharge: 2013-07-20 | Disposition: A | Discharge status: Home / Self Care | Payer: Self-pay | Attending: Emergency Medicine | Admitting: Emergency Medicine

## 2013-07-20 ENCOUNTER — Encounter (HOSPITAL_COMMUNITY): Payer: Self-pay | Admitting: Emergency Medicine

## 2013-07-20 DIAGNOSIS — F17200 Nicotine dependence, unspecified, uncomplicated: Secondary | ICD-10-CM | POA: Insufficient documentation

## 2013-07-20 DIAGNOSIS — K029 Dental caries, unspecified: Secondary | ICD-10-CM | POA: Insufficient documentation

## 2013-07-20 MED ORDER — OXYCODONE-ACETAMINOPHEN 5-325 MG PO TABS
2.0000 | ORAL_TABLET | Freq: Once | ORAL | Status: AC
  Administered 2013-07-20: 2 via ORAL
  Filled 2013-07-20: qty 2

## 2013-07-20 MED ORDER — HYDROCODONE-ACETAMINOPHEN 5-325 MG PO TABS: ORAL_TABLET | ORAL | Status: DC

## 2013-07-20 MED ORDER — PENICILLIN V POTASSIUM 500 MG PO TABS: 500.0000 mg | ORAL_TABLET | Freq: Four times a day (QID) | ORAL | Status: DC

## 2013-07-20 MED ORDER — PENICILLIN V POTASSIUM 500 MG PO TABS
500.0000 mg | ORAL_TABLET | Freq: Once | ORAL | Status: AC
  Administered 2013-07-20: 500 mg via ORAL
  Filled 2013-07-20: qty 1

## 2013-07-20 NOTE — ED Notes (Signed)
Pt ambulatory to exam room with steady gait.  

## 2013-07-20 NOTE — ED Notes (Signed)
Pt states he has a ride home

## 2013-07-20 NOTE — ED Notes (Signed)
Pt reports that he has dental pain to his entire mouth, states seen here x1 mo ago, reports went to a free clinic but was told he needs insurance to have all of his teeth removed.

## 2013-07-20 NOTE — ED Provider Notes (Signed)
CSN: [id number]     Arrival date & time 07/20/13  2048 History  This chart was scribed for Junius Finner, PA, working with Audree Camel, MD, by Ardelia Mems ED Scribe. This patient was seen in room WTR9/WTR9 and the patient's care was started at 11:01 PM.   Chief Complaint  Patient presents with  . Dental Pain    The history is provided by the patient. No language interpreter was used.    HPI Comments: Taylor Ochoa is a 57 y.o. male who presents to the Emergency Department complaining of constant, moderate dental pain encompassing his entire mouth onset today. He states that he has a history of dental pain which flared up again today. He states that he is waiting on his insurance to kick in and that he is going to try to make an appointment next week to have multiple teeth removed. He denies fever, nausea, emesis, difficulty breathing or swallowing  or any other symptoms. He is a current every day smoker of 1 pack/day.  History reviewed. No pertinent past medical history. Past Surgical History  Procedure Laterality Date  . Leg surgery      rod placed in left lef 12/2009  . Wrist surgery     History reviewed. No pertinent family history.  History  Substance Use Topics  . Smoking status: Current Every Day Smoker -- 1.00 packs/day    Types: Cigarettes  . Smokeless tobacco: Not on file  . Alcohol Use: Yes    Review of Systems  Constitutional: Negative for fever and chills.  HENT: Positive for dental problem. Negative for trouble swallowing.   Respiratory: Negative for shortness of breath.   Gastrointestinal: Negative for nausea and vomiting.  All other systems reviewed and are negative.   Allergies  Review of patient's allergies indicates no known allergies.  Home Medications   Current Outpatient Rx  Name  Route  Sig  Dispense  Refill  . ibuprofen (ADVIL,MOTRIN) 200 MG tablet   Oral   Take 400 mg by mouth daily as needed for pain. For hand pain         .  HYDROcodone-acetaminophen (NORCO/VICODIN) 5-325 MG per tablet      Take 1 pill every 6 hours as needed for pain.   10 tablet   0   . penicillin v potassium (VEETID) 500 MG tablet   Oral   Take 1 tablet (500 mg total) by mouth 4 (four) times daily.   40 tablet   0    Triage Vitals: BP 137/85  Pulse 86  Temp(Src) 98.6 F (37 C) (Oral)  Resp 16  Ht 5\' 9"  (1.753 m)  Wt 145 lb (65.772 kg)  BMI 21.4 kg/m2  SpO2 98%  Physical Exam  Nursing note and vitals reviewed. Constitutional: He is oriented to person, place, and time. He appears well-developed and well-nourished.  HENT:  Head: Normocephalic and atraumatic.  Mouth/Throat: Uvula is midline, oropharynx is clear and moist and mucous membranes are normal. He does not have dentures. No oral lesions. No trismus in the jaw. Abnormal dentition. Dental caries present. No dental abscesses, uvula swelling or lacerations. No oropharyngeal exudate, posterior oropharyngeal edema, posterior oropharyngeal erythema or tonsillar abscesses.  Dental decay in upper and lower jaw. Multiple missing teeth. Tenderness along left upper and lower back gingiva. No dental abscesses.  Eyes: EOM are normal.  Neck: Normal range of motion.  Cardiovascular: Normal rate.   Pulmonary/Chest: Effort normal.  Musculoskeletal: Normal range of motion.  Neurological:  He is alert and oriented to person, place, and time.  Skin: Skin is warm and dry.  Psychiatric: He has a normal mood and affect. His behavior is normal.    ED Course  Procedures (including critical care time)  DIAGNOSTIC STUDIES: Oxygen Saturation is 98% on RA, normal by my interpretation.    COORDINATION OF CARE: 11:06 PM- Pt advised of plan for treatment and pt agrees.   Labs Review Labs Reviewed - No data to display Imaging Review No results found.  EKG Interpretation   None       MDM   1. Dental decay    Pt presenting with extensive dental decay. Reports working on [employer] so he can have teeth removed from left side of mouth.  No obvious dental abscess requiring I&D today.  Tx in ED: PCN and percocet.  Rx: PCN and norco.  Advised to f/u as planned with his dentist.  Also encouraged to drink water instead of sugary drinks like the soda he came in with tonight. Return precautions provider. Pt verbalized understanding and agreement with tx plan.  I personally performed the services described in this documentation, which was scribed in my presence. The recorded information has been reviewed and is accurate.    Junius Finner, PA-C 07/20/13 2342

## 2013-07-21 NOTE — ED Provider Notes (Signed)
Medical screening examination/treatment/procedure(s) were performed by non-physician practitioner and as supervising physician I was immediately available for consultation/collaboration.  EKG Interpretation   None         Audree Camel, MD 07/21/13 1108

## 2013-10-08 ENCOUNTER — Emergency Department (HOSPITAL_COMMUNITY)
Admission: EM | Admit: 2013-10-08 | Discharge: 2013-10-08 | Disposition: A | Discharge status: Home / Self Care | Payer: 59 | Attending: Emergency Medicine | Admitting: Emergency Medicine

## 2013-10-08 ENCOUNTER — Encounter (HOSPITAL_COMMUNITY): Payer: Self-pay | Admitting: Emergency Medicine

## 2013-10-08 DIAGNOSIS — Y9389 Activity, other specified: Secondary | ICD-10-CM | POA: Insufficient documentation

## 2013-10-08 DIAGNOSIS — Y92410 Unspecified street and highway as the place of occurrence of the external cause: Secondary | ICD-10-CM | POA: Insufficient documentation

## 2013-10-08 DIAGNOSIS — F17200 Nicotine dependence, unspecified, uncomplicated: Secondary | ICD-10-CM | POA: Insufficient documentation

## 2013-10-08 DIAGNOSIS — IMO0002 Reserved for concepts with insufficient information to code with codable children: Secondary | ICD-10-CM | POA: Insufficient documentation

## 2013-10-08 LAB — URINALYSIS, ROUTINE W REFLEX MICROSCOPIC
BILIRUBIN URINE: NEGATIVE
Glucose, UA: NEGATIVE mg/dL
Hgb urine dipstick: NEGATIVE
KETONES UR: NEGATIVE mg/dL
Leukocytes, UA: NEGATIVE
NITRITE: NEGATIVE
PH: 6 (ref 5.0–8.0)
Protein, ur: NEGATIVE mg/dL
Specific Gravity, Urine: 1.006 (ref 1.005–1.030)
UROBILINOGEN UA: 0.2 mg/dL (ref 0.0–1.0)

## 2013-10-08 MED ORDER — HYDROCODONE-ACETAMINOPHEN 5-325 MG PO TABS
1.0000 | ORAL_TABLET | Freq: Once | ORAL | Status: AC
  Administered 2013-10-08: 1 via ORAL
  Filled 2013-10-08: qty 1

## 2013-10-08 MED ORDER — HYDROCODONE-ACETAMINOPHEN 5-325 MG PO TABS: 1.0000 | ORAL_TABLET | ORAL | Status: DC | PRN
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
## 2013-10-08 NOTE — Discharge Instructions (Signed)
Take vicodin as prescribed for severe pain.  Do not drive within four hours of taking this medication (may cause drowsiness or confusion).   Take ibuprofen as well; up to 800mg  three times a day with food.    Ice 2-3 times a day for 15-20 minutes.  Avoid activities that aggravate pain.  You should return to the ER if you develop change in or worsening of pain, fever (100.5 degrees or greater), inability to walk due to leg weakness or loss of control of bladder/bowels.
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
## 2013-10-09 NOTE — ED Notes (Signed)
Opened chart to answer question regarding prescription for the pharmacist

## 2013-10-10 NOTE — ED Provider Notes (Signed)
Medical screening examination/treatment/procedure(s) were performed by non-physician practitioner and as supervising physician I was immediately available for consultation/collaboration.  EKG Interpretation   None         Saddie Benders. Stephenson Cichy, MD 10/10/13 2350

## 2013-11-23 ENCOUNTER — Encounter (HOSPITAL_COMMUNITY): Payer: Self-pay | Admitting: Emergency Medicine

## 2013-11-23 ENCOUNTER — Emergency Department (HOSPITAL_COMMUNITY)
Admission: EM | Admit: 2013-11-23 | Discharge: 2013-11-23 | Disposition: A | Discharge status: Home / Self Care | Payer: 59 | Attending: Emergency Medicine | Admitting: Emergency Medicine

## 2013-11-23 DIAGNOSIS — K089 Disorder of teeth and supporting structures, unspecified: Secondary | ICD-10-CM | POA: Insufficient documentation

## 2013-11-23 DIAGNOSIS — K0889 Other specified disorders of teeth and supporting structures: Secondary | ICD-10-CM

## 2013-11-23 DIAGNOSIS — F17200 Nicotine dependence, unspecified, uncomplicated: Secondary | ICD-10-CM | POA: Insufficient documentation

## 2013-11-23 MED ORDER — PENICILLIN V POTASSIUM 500 MG PO TABS: 500.0000 mg | ORAL_TABLET | Freq: Three times a day (TID) | ORAL | Status: DC

## 2013-11-23 MED ORDER — PENICILLIN V POTASSIUM 500 MG PO TABS
500.0000 mg | ORAL_TABLET | Freq: Once | ORAL | Status: AC
  Administered 2013-11-23: 500 mg via ORAL
  Filled 2013-11-23: qty 1

## 2013-11-23 NOTE — ED Notes (Addendum)
Pt reports dental pain to his L upper teeth since yesterday, states "they are all bad and I'm having to have them pulled one at a time." Pt states he is taking Penicillin and Ibuprofen, last x3 hours ago. Pt a&O x4, NAD noted at this time

## 2013-11-23 NOTE — Discharge Instructions (Signed)
Take antibiotic as prescribed. Take ibuprofen w/ food up to three times a day, as well.  Follow up with a dentist as soon as possible.  If you contact the dentist you have been referred to within 48 hours, your follow up appointment will be considered part of today's ER visit.  You should return to the ER if you develop worsening symptoms or facial swelling.

## 2013-11-23 NOTE — ED Provider Notes (Signed)
CSN: [id number]     Arrival date & time 11/23/13  1945 History  This chart was scribed for non-physician practitioner Kandice Moos, PA-C working with Ephraim Hamburger, MD by Eston Mould, ED Scribe. This patient was seen in room [place] and the patient's care was started at 8:59 PM .   Chief Complaint  Patient presents with  . Dental Pain   The history is provided by the patient. No language interpreter was used.  HPI Comments: Taylor Ochoa is a 58 y.o. male who presents to the Emergency Department complaining of ongoing L upper dental pain that began yesterday evening. Pt states "all of his teeth are bad and will have them pulled at one time". Pt states he stood in line for 6 hours at the free dental clinic and states he was informed "they can not do anything for him". Pt states he has been seeing a dentist whom extracts his teeth 1 at a time. He states the dentist extracts his teeth one at a time due to cost; $175 per tooth. He reports being prescribed antibiotics by dentist during his last visit. Pt states he has noted L sided neck swelling due to dental pain he is having. Pt denies any facial injuries to L side of face. He states he has been taking Penicillin and Ibuprofen with little relief, last taken 3 hours ago. Pt states he began taking Penicillin yesterday and reports taking 3 doses yesterday evening. Pt denies taking any other OTC medications. Pt denies DM and any other chronic illnesses. Pt denies recent fever.  History reviewed. No pertinent past medical history. Past Surgical History  Procedure Laterality Date  . Leg surgery      rod placed in left lef 12/2009  . Wrist surgery     History reviewed. No pertinent family history. History  Substance Use Topics  . Smoking status: Current Every Day Smoker -- 1.00 packs/day    Types: Cigarettes  . Smokeless tobacco: Never Used  . Alcohol Use: Yes    Review of Systems  Constitutional: Negative for fever.  HENT:  Positive for dental problem. Negative for drooling and trouble swallowing.   Musculoskeletal: Positive for neck pain (L side).  All other systems reviewed and are negative.   Allergies  Review of patient's allergies indicates no known allergies.  Home Medications   Current Outpatient Rx  Name  Route  Sig  Dispense  Refill  . HYDROcodone-acetaminophen (NORCO/VICODIN) 5-325 MG per tablet   Oral   Take 1 tablet by mouth every 4 (four) hours as needed for moderate pain.   15 tablet   0   . ibuprofen (ADVIL,MOTRIN) 200 MG tablet   Oral   Take 400 mg by mouth daily as needed for pain.           BP 130/77  Pulse 71  Temp(Src) 98.4 F (36.9 C) (Oral)  Resp 16  Ht 5\' 9"  (1.753 m)  Wt 144 lb (65.318 kg)  BMI 21.26 kg/m2  SpO2 97%  Physical Exam  Nursing note and vitals reviewed. Constitutional: He is oriented to person, place, and time. He appears well-developed and well-nourished. No distress.  HENT:  Head: Normocephalic and atraumatic.  Advanced caries throughout mouth.  Tenderness Several left upper teeth.  No changes to adjacent gingiva or buccal mucosa.    Eyes:  Normal appearance  Neck: Normal range of motion.  Pulmonary/Chest: Effort normal.  Musculoskeletal: Normal range of motion.  Neurological: He is alert and oriented to  person, place, and time.  Psychiatric: He has a normal mood and affect. His behavior is normal.   ED Course  Procedures  DIAGNOSTIC STUDIES: Oxygen Saturation is 97% on RA, normal by my interpretation.    COORDINATION OF CARE: 9:04 PM-Discussed treatment plan which includes discharge pt with Penicillin and will give pain medication while in ED. Advised pt to F/U with dentist and will provide pt a reference list. Pt agreed to plan.   Labs Review Labs Reviewed - No data to display Imaging Review No results found.   EKG Interpretation None     MDM   Final diagnoses:  Pain, dental   Healthy 58yo M presents w/ L upper dental pain.   His dentist has been extracting teeth one at at time d/t patient's financial limitations, but pain has recently become intolerable.  Advanced caries throughout mouth.  Tenderness of several L upper teeth.  Pt received vicodin for pain in ED and d/c'd home w/ penicillin and referral to dentist on call.  He understands that if he schedules appt in next 48 hours, it will be considered part of his ED visit.  Return precautions discussed.    I personally performed the services described in this documentation, which was scribed in my presence. The recorded information has been reviewed and is accurate.    Remer Macho, PA-C 11/24/13 2052

## 2013-11-26 NOTE — ED Provider Notes (Signed)
Medical screening examination/treatment/procedure(s) were performed by non-physician practitioner and as supervising physician I was immediately available for consultation/collaboration.   EKG Interpretation None        Ephraim Hamburger, MD 11/26/13 1408

## 2013-12-16 ENCOUNTER — Encounter (HOSPITAL_COMMUNITY): Payer: Self-pay | Admitting: Emergency Medicine

## 2013-12-16 ENCOUNTER — Emergency Department (HOSPITAL_COMMUNITY)
Admission: EM | Admit: 2013-12-16 | Discharge: 2013-12-16 | Disposition: A | Discharge status: Home / Self Care | Payer: 59 | Attending: Emergency Medicine | Admitting: Emergency Medicine

## 2013-12-16 DIAGNOSIS — K047 Periapical abscess without sinus: Secondary | ICD-10-CM | POA: Insufficient documentation

## 2013-12-16 DIAGNOSIS — F17200 Nicotine dependence, unspecified, uncomplicated: Secondary | ICD-10-CM | POA: Insufficient documentation

## 2013-12-16 DIAGNOSIS — K0381 Cracked tooth: Secondary | ICD-10-CM | POA: Insufficient documentation

## 2013-12-16 DIAGNOSIS — K029 Dental caries, unspecified: Secondary | ICD-10-CM | POA: Insufficient documentation

## 2013-12-16 MED ORDER — PENICILLIN V POTASSIUM 250 MG PO TABS
500.0000 mg | ORAL_TABLET | Freq: Once | ORAL | Status: AC
  Administered 2013-12-16: 500 mg via ORAL
  Filled 2013-12-16: qty 2

## 2013-12-16 MED ORDER — OXYCODONE-ACETAMINOPHEN 5-325 MG PO TABS
2.0000 | ORAL_TABLET | Freq: Once | ORAL | Status: AC
  Administered 2013-12-16: 2 via ORAL
  Filled 2013-12-16: qty 2

## 2013-12-16 MED ORDER — OXYCODONE-ACETAMINOPHEN 5-325 MG PO TABS: 1.0000 | ORAL_TABLET | Freq: Four times a day (QID) | ORAL | Status: DC | PRN

## 2013-12-16 MED ORDER — PENICILLIN V POTASSIUM 500 MG PO TABS: 500.0000 mg | ORAL_TABLET | Freq: Four times a day (QID) | ORAL | Status: AC

## 2013-12-16 NOTE — ED Notes (Signed)
Pt reports left upper gum swelling from abscessed teeth. States that he has an appt with oral surgeon this coming Wednesday, but is out of pain medicine.

## 2013-12-16 NOTE — ED Notes (Signed)
Pt. reports left lower/upper  molar pain onset yesterday morning unrelieved by OTC pain medications .

## 2013-12-16 NOTE — Discharge Instructions (Signed)
Please follow up with a dentist for continued evaluation and treatment.   Take all of your antibiotics as instructed.    Abscessed Tooth An abscessed tooth is an infection around your tooth. It may be caused by holes or damage to the tooth (cavity) or a dental disease. An abscessed tooth causes mild to very bad pain in and around the tooth. See your dentist right away if you have tooth or gum pain. HOME CARE  Take your medicine as told. Finish it even if you start to feel better.  Do not drive after taking pain medicine.  Rinse your mouth (gargle) often with salt water ( teaspoon salt in 8 ounces of warm water).  Do not apply heat to the outside of your face. GET HELP RIGHT AWAY IF:   You have a temperature by mouth above 102 F (38.9 C), not controlled by medicine.  You have chills and a very bad headache.  You have problems breathing or swallowing.  Your mouth will not open.  You develop puffiness (swelling) on the neck or around the eye.  Your pain is not helped by medicine.  Your pain is getting worse instead of better. MAKE SURE YOU:   Understand these instructions.  Will watch your condition.  Will get help right away if you are not doing well or get worse. Document Released: 02/26/2008 Document Revised: 12/02/2011 Document Reviewed: 12/18/2010 [facility] Patient Information 2014 [place].    Dental Care and Dentist Visits Dental care supports good overall health. Regular dental visits can also help you avoid dental pain, bleeding, infection, and other more serious health problems in the future. It is important to keep the mouth healthy because diseases in the teeth, gums, and other oral tissues can spread to other areas of the body. Some problems, such as diabetes, heart disease, and pre-term labor have been associated with poor oral health.  See your dentist every 6 months. If you experience emergency problems such as a toothache or broken tooth, go to the  dentist right away. If you see your dentist regularly, you may catch problems early. It is easier to be treated for problems in the early stages.  WHAT TO EXPECT AT A DENTIST VISIT  Your dentist will look for many common oral health problems and recommend proper treatment. At your regular dental visit, you can expect:  Gentle cleaning of the teeth and gums. This includes scraping and polishing. This helps to remove the sticky substance around the teeth and gums (plaque). Plaque forms in the mouth shortly after eating. Over time, plaque hardens on the teeth as tartar. If tartar is not removed regularly, it can cause problems. Cleaning also helps remove stains.  Periodic X-rays. These pictures of the teeth and supporting bone will help your dentist assess the health of your teeth.  Periodic fluoride treatments. Fluoride is a natural mineral shown to help strengthen teeth. Fluoride treatmentinvolves applying a fluoride gel or varnish to the teeth. It is most commonly done in children.  Examination of the mouth, tongue, jaws, teeth, and gums to look for any oral health problems, such as:  Cavities (dental caries). This is decay on the tooth caused by plaque, sugar, and acid in the mouth. It is best to catch a cavity when it is small.  Inflammation of the gums caused by plaque buildup (gingivitis).  Problems with the mouth or malformed or misaligned teeth.  Oral cancer or other diseases of the soft tissues or jaws. KEEP YOUR TEETH AND  GUMS HEALTHY For healthy teeth and gums, follow these general guidelines as well as your dentist's specific advice:  Have your teeth professionally cleaned at the dentist every 6 months.  Brush twice daily with a fluoride toothpaste.  Floss your teeth daily.  Ask your dentist if you need fluoride supplements, treatments, or fluoride toothpaste.  Eat a healthy diet. Reduce foods and drinks with added sugar.  Avoid smoking. TREATMENT FOR ORAL HEALTH  PROBLEMS If you have oral health problems, treatment varies depending on the conditions present in your teeth and gums.  Your caregiver will most likely recommend good oral hygiene at each visit.  For cavities, gingivitis, or other oral health disease, your caregiver will perform a procedure to treat the problem. This is typically done at a separate appointment. Sometimes your caregiver will refer you to another dental specialist for specific tooth problems or for surgery. SEEK IMMEDIATE DENTAL CARE IF:  You have pain, bleeding, or soreness in the gum, tooth, jaw, or mouth area.  A permanent tooth becomes loose or separated from the gum socket.  You experience a blow or injury to the mouth or jaw area. Document Released: 05/22/2011 Document Revised: 12/02/2011 Document Reviewed: 05/22/2011 [facility] Patient Information 2014 [place], [place].

## 2013-12-16 NOTE — ED Provider Notes (Signed)
CSN: [id number]     Arrival date & time 12/16/13  2118 History   First MD Initiated Contact with Patient 12/16/13 2252     Chief Complaint  Patient presents with  . Dental Pain   HPI  History provided by the patient. Patient is a 58 year old male with no significant PMH presenting with worsened dental pains. Patient states he has had some dental pain and problems for a long time. He was recently doing well but over the past few days he's had worsening left-sided upper and lower pains. Today at work his pain became significantly worse and he feels subjective swelling into his face and cheek area. He did take 2 tramadol that he had from a previous prescription without any improvement of the pain. He states that he has made an appointment with his dentist but it is not for another one to 2 weeks. Denies any associated fever, chills or sweats. No swelling under the tongue or difficulty breathing. No other aggravating or alleviating factors. No other associated symptoms.    History reviewed. No pertinent past medical history. Past Surgical History  Procedure Laterality Date  . Leg surgery      rod placed in left lef 12/2009  . Wrist surgery     No family history on file. History  Substance Use Topics  . Smoking status: Current Every Day Smoker -- 1.00 packs/day    Types: Cigarettes  . Smokeless tobacco: Never Used  . Alcohol Use: Yes    Review of Systems  Constitutional: Negative for fever and chills.  All other systems reviewed and are negative.      Allergies  Review of patient's allergies indicates no known allergies.  Home Medications   Current Outpatient Rx  Name  Route  Sig  Dispense  Refill  . ibuprofen (ADVIL,MOTRIN) 200 MG tablet   Oral   Take 400-800 mg by mouth daily as needed for pain.          . traMADol (ULTRAM) 50 MG tablet   Oral   Take 50 mg by mouth every 6 (six) hours as needed for moderate pain.          BP 138/86  Pulse 86  Temp(Src) 98.2 F  (36.8 C) (Oral)  Resp 14  Ht 5\' 9"  (1.753 m)  Wt 145 lb (65.772 kg)  BMI 21.40 kg/m2  SpO2 97% Physical Exam  Nursing note and vitals reviewed. Constitutional: He is oriented to person, place, and time. He appears well-developed and well-nourished.  HENT:  Head: Normocephalic and atraumatic.  Poor dentition throughout. Nearly all the teeth are decayed and broken to the gum lines. There is some swelling and tenderness all around the gums where the left canine and first premolars would be. There is no signs of a drainable abscess. No significant asymmetry in facial appearance.  Neck: Normal range of motion. Neck supple.  Cardiovascular: Normal rate and regular rhythm.   Pulmonary/Chest: Effort normal and breath sounds normal. No stridor. No respiratory distress. He has no wheezes.  Lymphadenopathy:    He has no cervical adenopathy.  Neurological: He is alert and oriented to person, place, and time.  Skin: Skin is warm.  Psychiatric: He has a normal mood and affect. His behavior is normal.    ED Course  Procedures   DIAGNOSTIC STUDIES: Oxygen Saturation is 97% on room air.    COORDINATION OF CARE:  Nursing notes reviewed. Vital signs reviewed. Initial pt interview and examination performed.   10:57 PM-patient  seen and evaluated. The patient appears uncomfortable . Does not appear severely ill or toxic.  Discussed with patient the need to follow up with the dentist. Will give small prescription of pain medication as well as antibiotic.  Treatment plan initiated: Medications  oxyCODONE-acetaminophen (PERCOCET/ROXICET) 5-325 MG per tablet 2 tablet (not administered)  penicillin v potassium (VEETID) tablet 500 mg (not administered)      MDM   Final diagnoses:  Pain due to dental caries  Periapical abscess        Martie Lee, PA-C 12/16/13 2320

## 2013-12-21 NOTE — ED Provider Notes (Signed)
Medical screening examination/treatment/procedure(s) were performed by non-physician practitioner and as supervising physician I was immediately available for consultation/collaboration.   EKG Interpretation None        Houston Siren III, MD 12/21/13 [phone]

## 2014-02-22 ENCOUNTER — Ambulatory Visit (INDEPENDENT_AMBULATORY_CARE_PROVIDER_SITE_OTHER): Payer: 59 | Admitting: Internal Medicine

## 2014-02-22 ENCOUNTER — Encounter: Payer: Self-pay | Admitting: Internal Medicine

## 2014-02-22 VITALS — BP 118/70 | HR 71 | Temp 97.7°F | Ht 69.0 in | Wt 147.0 lb

## 2014-02-22 DIAGNOSIS — S82109A Unspecified fracture of upper end of unspecified tibia, initial encounter for closed fracture: Secondary | ICD-10-CM

## 2014-02-22 DIAGNOSIS — Z0000 Encounter for general adult medical examination without abnormal findings: Secondary | ICD-10-CM | POA: Insufficient documentation

## 2014-02-22 DIAGNOSIS — X58XXXA Exposure to other specified factors, initial encounter: Secondary | ICD-10-CM

## 2014-02-22 DIAGNOSIS — Z720 Tobacco use: Secondary | ICD-10-CM | POA: Insufficient documentation

## 2014-02-22 DIAGNOSIS — K219 Gastro-esophageal reflux disease without esophagitis: Secondary | ICD-10-CM

## 2014-02-22 DIAGNOSIS — E785 Hyperlipidemia, unspecified: Secondary | ICD-10-CM

## 2014-02-22 DIAGNOSIS — F17200 Nicotine dependence, unspecified, uncomplicated: Secondary | ICD-10-CM

## 2014-02-22 LAB — CBC WITH DIFFERENTIAL/PLATELET
BASOS PCT: 0 % (ref 0–1)
Basophils Absolute: 0 10*3/uL (ref 0.0–0.1)
EOS ABS: 0.1 10*3/uL (ref 0.0–0.7)
EOS PCT: 1 % (ref 0–5)
HCT: 42.7 % (ref 39.0–52.0)
HEMOGLOBIN: 14.6 g/dL (ref 13.0–17.0)
Lymphocytes Relative: 40 % (ref 12–46)
Lymphs Abs: 2.6 10*3/uL (ref 0.7–4.0)
MCH: 31.5 pg (ref 26.0–34.0)
MCHC: 34.2 g/dL (ref 30.0–36.0)
MCV: 92 fL (ref 78.0–100.0)
MONO ABS: 0.5 10*3/uL (ref 0.1–1.0)
MONOS PCT: 8 % (ref 3–12)
NEUTROS PCT: 51 % (ref 43–77)
Neutro Abs: 3.4 10*3/uL (ref 1.7–7.7)
Platelets: 237 10*3/uL (ref 150–400)
RBC: 4.64 MIL/uL (ref 4.22–5.81)
RDW: 14.1 % (ref 11.5–15.5)
WBC: 6.6 10*3/uL (ref 4.0–10.5)

## 2014-02-22 MED ORDER — RANITIDINE HCL 150 MG PO TABS: 150.0000 mg | ORAL_TABLET | Freq: Two times a day (BID) | ORAL | Status: DC

## 2014-02-22 MED ORDER — IBUPROFEN 200 MG PO TABS: ORAL_TABLET | ORAL | Status: DC
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
## 2014-02-22 NOTE — Assessment & Plan Note (Signed)
Chronic tobacco abuse, 1 pack per a day for 40 years.  Plans: Counseled patient regarding tobacco cessation. Patient is not willing to quit currently and states that he will think about it. Offered assistance when he is ready to quit.

## 2014-02-22 NOTE — Patient Instructions (Addendum)
Follow up with Orthopedics as recommended. Take Zantac 150 mg, one tablet twice daily. Take Ibuprofen as instructed.
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
## 2014-02-22 NOTE — Assessment & Plan Note (Signed)
Plans: Check CBC, CMP, FLP. Patient refused Colonoscopy and states "don't like it"

## 2014-02-22 NOTE — Assessment & Plan Note (Signed)
In the setting of NSAID use. Patient complaining of "acid reflux" since he started Ibuprofen 4-5 months ago.  Plans: Start Ranitidine 150 mg BID.

## 2014-02-23 DIAGNOSIS — E785 Hyperlipidemia, unspecified: Secondary | ICD-10-CM | POA: Insufficient documentation

## 2014-02-23 LAB — LIPID PANEL
CHOL/HDL RATIO: 4.6 ratio
CHOLESTEROL: 181 mg/dL (ref 0–200)
HDL: 39 mg/dL — AB (ref >39)
LDL Cholesterol: 125 mg/dL — ABNORMAL HIGH (ref 0–99)
TRIGLYCERIDES: 84 mg/dL (ref <150)
VLDL: 17 mg/dL (ref 0–40)

## 2014-02-23 LAB — COMPLETE METABOLIC PANEL WITH GFR
ALK PHOS: 62 U/L (ref 39–117)
ALT: 12 U/L (ref 0–53)
AST: 14 U/L (ref 0–37)
Albumin: 4.1 g/dL (ref 3.5–5.2)
BILIRUBIN TOTAL: 0.4 mg/dL (ref 0.2–1.2)
BUN: 22 mg/dL (ref 6–23)
CO2: 26 mEq/L (ref 19–32)
CREATININE: 0.87 mg/dL (ref 0.50–1.35)
Calcium: 9.4 mg/dL (ref 8.4–10.5)
Chloride: 105 mEq/L (ref 96–112)
GFR, Est African American: >89 mL/min
GLUCOSE: 101 mg/dL — AB (ref 70–99)
Potassium: 4.4 mEq/L (ref 3.5–5.3)
SODIUM: 139 meq/L (ref 135–145)
Total Protein: 6.6 g/dL (ref 6.0–8.3)

## 2014-02-23 NOTE — Assessment & Plan Note (Addendum)
Slightly elevated LDL at 125 and HDL of 39. Major risk factors are smoking and age >69, with a 10 year cardiovascular disease risk score  of 12% Per ATP III guidelines, in patients with two or more major risk factors and 10 year risk of CVD less than 20%, goal LDL is <130.  Plans: No current indications for Statin therapy for primary prevention. Repeat Lipid Panel in 6 months.

## 2014-02-24 NOTE — Progress Notes (Signed)
Case discussed with Dr. Boggala at the time of the visit.  We reviewed the resident's history and exam and pertinent patient test results.  I agree with the assessment, diagnosis, and plan of care documented in the resident's note. 

## 2014-08-07 ENCOUNTER — Emergency Department (INDEPENDENT_AMBULATORY_CARE_PROVIDER_SITE_OTHER)
Admission: EM | Admit: 2014-08-07 | Discharge: 2014-08-07 | Disposition: A | Discharge status: Home / Self Care | Payer: 59 | Source: Home / Self Care | Attending: Emergency Medicine | Admitting: Emergency Medicine

## 2014-08-07 ENCOUNTER — Encounter (HOSPITAL_COMMUNITY): Payer: Self-pay | Admitting: *Deleted

## 2014-08-07 DIAGNOSIS — R21 Rash and other nonspecific skin eruption: Secondary | ICD-10-CM

## 2014-08-07 MED ORDER — VALACYCLOVIR HCL 1 G PO TABS
1000.0000 mg | ORAL_TABLET | Freq: Three times a day (TID) | ORAL | Status: AC
Start: 2014-08-07 — End: 2014-08-21

## 2014-08-07 MED ORDER — DOXYCYCLINE HYCLATE 100 MG PO CAPS: 100.0000 mg | ORAL_CAPSULE | Freq: Two times a day (BID) | ORAL | Status: DC

## 2014-08-07 MED ORDER — HYDROCODONE-ACETAMINOPHEN 5-325 MG PO TABS: 1.0000 | ORAL_TABLET | Freq: Four times a day (QID) | ORAL | Status: DC | PRN

## 2014-08-07 NOTE — ED Notes (Addendum)
C/o rash on L ant. Chest and spread to L shoulder, L upper back, L neck and L face onset Friday.  It got painful and itching yesterday.  Could not sleep due to pain.  Can swallow better now

## 2014-08-07 NOTE — Discharge Instructions (Signed)
The rash is either shingles or a bacterial infection. We are going to cover you for both. Take Valtrex 3 times a day for 1 week for shingles. Take Doxycycline twice a day for 10 days for bacteria. Use the norco as needed for pain.  Follow up if symptoms worsen or do not improve.

## 2014-08-07 NOTE — ED Provider Notes (Signed)
CSN: [id number]     Arrival date & time 08/07/14  1726 History   First MD Initiated Contact with Patient 08/07/14 1757     Chief Complaint  Patient presents with  . Rash   (Consider location/radiation/quality/duration/timing/severity/associated sxs/prior Treatment) HPI He is a 58 year old man here for evaluation of rash. He states it started on Friday on his left anterior chest. The rash is red bumps. He states they were initially itchy, but now are painful. The rash has spread to involve his left shoulder, left neck, left jaw. He reports some chills. No fevers no vomiting. No new detergents. He states he felt like something bit him with the first spots, but not with additional spots. He also reported some difficulty swallowing and ear pain yesterday, these have resolved.  Past Medical History  Diagnosis Date  . Depression   . Anemia    Past Surgical History  Procedure Laterality Date  . Leg surgery      rod placed in left lef 12/2009  . Wrist surgery  2011   Family History  Problem Relation Age of Onset  . Diabetes Mother   . Kidney disease Mother   . Cancer Father   . Diabetes Maternal Aunt   . Heart disease Maternal Aunt   . Stroke Maternal Aunt    History  Substance Use Topics  . Smoking status: Current Every Day Smoker -- 1.00 packs/day for 40 years    Types: Cigarettes  . Smokeless tobacco: Never Used  . Alcohol Use: No    Review of Systems  Constitutional: Negative.   Gastrointestinal: Negative.   Skin: Positive for rash.    Allergies  Review of patient's allergies indicates no known allergies.  Home Medications   Prior to Admission medications   Medication Sig Start Date End Date Taking? Authorizing Provider  ibuprofen (ADVIL,MOTRIN) 200 MG tablet Take 1 tablet every 6 hours as needed for pain 02/22/14  Yes Vijaya Mercer Pod, MD  ranitidine (ZANTAC) 150 MG tablet Take 1 tablet (150 mg total) by mouth 2 (two) times daily. 02/22/14 02/22/15 Yes Vijaya Mercer Pod, MD  doxycycline (VIBRAMYCIN) 100 MG capsule Take 1 capsule (100 mg total) by mouth 2 (two) times daily. 08/07/14   Melony Overly, MD  HYDROcodone-acetaminophen (NORCO) 5-325 MG per tablet Take 1 tablet by mouth every 6 (six) hours as needed for moderate pain. 08/07/14   Melony Overly, MD  valACYclovir (VALTREX) 1000 MG tablet Take 1 tablet (1,000 mg total) by mouth 3 (three) times daily. 08/07/14 08/21/14  Melony Overly, MD   BP 131/79 mmHg  Pulse 71  Temp(Src) 98.9 F (37.2 C) (Oral)  Resp 16  SpO2 100% Physical Exam  Constitutional: He is oriented to person, place, and time. He appears well-developed and well-nourished. No distress.  Cardiovascular: Normal rate.   Pulmonary/Chest: Effort normal.  Neurological: He is alert and oriented to person, place, and time.  Skin:     Erythematous papules on left anterior chest, left shoulder, left neck, and left face. Several have shallow ulcers.    ED Course  Procedures (including critical care time) Labs Review Labs Reviewed - No data to display  Imaging Review No results found.   MDM   1. Skin rash    Shingles versus bacterial infection. We'll treat with Valtrex for 1 week and doxycycline for 10 days. prescription for Norco provided to use for severe pain. Okay to continue using ibuprofen 800 mg 3 times a day. Follow-up here or  the emergency room if rash worsens or changes, or he develops fevers or vomiting.    Melony Overly, MD 08/07/14 [phone]

## 2014-12-27 ENCOUNTER — Telehealth: Payer: Self-pay | Admitting: Internal Medicine

## 2014-12-27 NOTE — Telephone Encounter (Signed)
Call to patient to confirm appointment for 12/28/14 at 2:45 lmtcb

## 2014-12-28 ENCOUNTER — Ambulatory Visit (INDEPENDENT_AMBULATORY_CARE_PROVIDER_SITE_OTHER): Payer: 59 | Admitting: Internal Medicine

## 2014-12-28 ENCOUNTER — Encounter: Payer: Self-pay | Admitting: Internal Medicine

## 2014-12-28 VITALS — BP 127/75 | HR 79 | Temp 98.2°F | Ht 69.0 in | Wt 147.6 lb

## 2014-12-28 DIAGNOSIS — K219 Gastro-esophageal reflux disease without esophagitis: Secondary | ICD-10-CM | POA: Diagnosis not present

## 2014-12-28 DIAGNOSIS — F17210 Nicotine dependence, cigarettes, uncomplicated: Secondary | ICD-10-CM

## 2014-12-28 DIAGNOSIS — R252 Cramp and spasm: Secondary | ICD-10-CM | POA: Insufficient documentation

## 2014-12-28 DIAGNOSIS — Z0000 Encounter for general adult medical examination without abnormal findings: Secondary | ICD-10-CM

## 2014-12-28 LAB — TSH: TSH: 1.173 u[IU]/mL (ref 0.350–4.500)

## 2014-12-28 LAB — BASIC METABOLIC PANEL WITH GFR
BUN: 16 mg/dL (ref 6–23)
CALCIUM: 9 mg/dL (ref 8.4–10.5)
CO2: 26 mEq/L (ref 19–32)
Chloride: 102 mEq/L (ref 96–112)
Creat: 0.82 mg/dL (ref 0.50–1.35)
Glucose, Bld: 89 mg/dL (ref 70–99)
POTASSIUM: 4.5 meq/L (ref 3.5–5.3)
SODIUM: 137 meq/L (ref 135–145)

## 2014-12-28 LAB — MAGNESIUM: MAGNESIUM: 2 mg/dL (ref 1.5–2.5)

## 2014-12-28 MED ORDER — MULTI-VITAMIN/MINERALS PO TABS
1.0000 | ORAL_TABLET | Freq: Every day | ORAL | Status: AC
Start: 2014-12-28 — End: 2015-12-28

## 2014-12-28 NOTE — Assessment & Plan Note (Signed)
Has refused a colonoscopy in the past, still refusing, since he needs to do stool testing for H Pylori i have recommended occult blood testing.

## 2014-12-28 NOTE — Assessment & Plan Note (Signed)
-   Patient gives a mixed history, I think that his cramping is more nocturnal and that he occasionally is having some arthritic pain when he is moving around and working.  He does not give a classic description of claudication but his main risk factor would be smoking. - I will check his potassium and magnesium as well as a TSH given his fatigue. - I have instructed him to start taking a multivitamin and see if that helps. - He will follow up in 2 months, if no better we may consider ABIs to evaluate further his blood flow.

## 2014-12-28 NOTE — Assessment & Plan Note (Signed)
-   Likey contributed by excessive coffee intake, smoking, and NSAIDs.  I instructed him to cut back on all three. - Will continue rantidine - Would like to evaluate for H Pylori, he is willing to do the stool ag testing.

## 2014-12-28 NOTE — Patient Instructions (Addendum)
General Instructions:  I want you to start taking a daily multivitamin.  Please bring your medicines with you each time you come to clinic.  Medicines may include prescription medications, over-the-counter medications, herbal remedies, eye drops, vitamins, or other pills.   Progress Toward Treatment Goals:  No flowsheet data found.  Self Care Goals & Plans:  Self Care Goal 12/28/2014  Manage my medications take my medicines as prescribed; bring my medications to every visit; refill my medications on time; follow the sick day instructions if I am sick  Eat healthy foods eat more vegetables; eat fruit for snacks and desserts  Be physically active find an activity I enjoy    No flowsheet data found.   Care Management & Community Referrals:  No flowsheet data found.
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
## 2014-12-29 ENCOUNTER — Encounter: Payer: Self-pay | Admitting: Internal Medicine

## 2014-12-29 NOTE — Progress Notes (Signed)
Internal Medicine Clinic Attending  Case discussed with Dr. Hoffman soon after the resident saw the patient.  We reviewed the resident's history and exam and pertinent patient test results.  I agree with the assessment, diagnosis, and plan of care documented in the resident's note. 

## 2015-01-03 ENCOUNTER — Emergency Department (HOSPITAL_COMMUNITY)
Admission: EM | Admit: 2015-01-03 | Discharge: 2015-01-03 | Disposition: A | Discharge status: Home / Self Care | Payer: 59 | Attending: Emergency Medicine | Admitting: Emergency Medicine

## 2015-01-03 ENCOUNTER — Encounter (HOSPITAL_COMMUNITY): Payer: Self-pay | Admitting: Emergency Medicine

## 2015-01-03 DIAGNOSIS — K0889 Other specified disorders of teeth and supporting structures: Secondary | ICD-10-CM

## 2015-01-03 DIAGNOSIS — Z8659 Personal history of other mental and behavioral disorders: Secondary | ICD-10-CM | POA: Insufficient documentation

## 2015-01-03 DIAGNOSIS — Z862 Personal history of diseases of the blood and blood-forming organs and certain disorders involving the immune mechanism: Secondary | ICD-10-CM | POA: Insufficient documentation

## 2015-01-03 DIAGNOSIS — K088 Other specified disorders of teeth and supporting structures: Secondary | ICD-10-CM | POA: Diagnosis present

## 2015-01-03 DIAGNOSIS — K0381 Cracked tooth: Secondary | ICD-10-CM | POA: Diagnosis not present

## 2015-01-03 DIAGNOSIS — Z720 Tobacco use: Secondary | ICD-10-CM | POA: Insufficient documentation

## 2015-01-03 MED ORDER — TRAMADOL HCL 50 MG PO TABS: 50.0000 mg | ORAL_TABLET | Freq: Four times a day (QID) | ORAL | Status: DC | PRN

## 2015-01-03 MED ORDER — PENICILLIN V POTASSIUM 500 MG PO TABS: 500.0000 mg | ORAL_TABLET | Freq: Four times a day (QID) | ORAL | Status: DC

## 2015-01-03 NOTE — ED Provider Notes (Signed)
CSN: [id number]     Arrival date & time 01/03/15  2031 History  This chart was scribed for non-physician practitioner, Quincy Carnes, PA-C working with Leonard Schwartz, MD by Frederich Balding, ED scribe. This patient was seen in room TR10C/TR10C and the patient's care was started at 9:08 PM.   Chief Complaint  Patient presents with  . Dental Pain   The history is provided by the patient. No language interpreter was used.    HPI Comments: Taylor Ochoa is a 59 y.o. male who presents to the Emergency Department complaining of right upper dental pain that started earlier today. The tooth has been broken for a while but another piece broke off while eating a corn dog. Pressure worsens pain. Pt has taken 800 mg ibuprofen with little relief. There are no alleviating factors. Pt can not remember the name of his dentist, states male in [facility].  No fever, chills, sweats.  Past Medical History  Diagnosis Date  . Depression   . Anemia    Past Surgical History  Procedure Laterality Date  . Leg surgery      rod placed in left lef 12/2009  . Wrist surgery  2011   Family History  Problem Relation Age of Onset  . Diabetes Mother   . Kidney disease Mother   . Cancer Father   . Diabetes Maternal Aunt   . Heart disease Maternal Aunt   . Stroke Maternal Aunt    History  Substance Use Topics  . Smoking status: Current Every Day Smoker -- 0.50 packs/day for 40 years    Types: Cigarettes  . Smokeless tobacco: Never Used  . Alcohol Use: No    Review of Systems  HENT: Positive for dental problem.   All other systems reviewed and are negative.  Allergies  Review of patient's allergies indicates no known allergies.  Home Medications   Prior to Admission medications   Medication Sig Start Date End Date Taking? Authorizing Provider  ibuprofen (ADVIL,MOTRIN) 200 MG tablet Take 1 tablet every 6 hours as needed for pain 02/22/14   Malena Catholic, MD  Multiple Vitamins-Minerals  (MULTIVITAMIN WITH MINERALS) tablet Take 1 tablet by mouth daily. 12/28/14 12/28/15  Lucious Groves, DO  ranitidine (ZANTAC) 150 MG tablet Take 1 tablet (150 mg total) by mouth 2 (two) times daily. 02/22/14 02/22/15  Malena Catholic, MD   BP 138/90 mmHg  Pulse 75  Temp(Src) 98.2 F (36.8 C) (Oral)  Resp 16  Ht 5\' 9"  (1.753 m)  Wt 145 lb (65.772 kg)  BMI 21.40 kg/m2  SpO2 98%   Physical Exam  Constitutional: He is oriented to person, place, and time. He appears well-developed and well-nourished.  HENT:  Head: Normocephalic and atraumatic.  Mouth/Throat: Oropharynx is clear and moist.  Teeth largely in very poordentition, multiple teeth have been extracted, right upper molar broken with large cavity present, surrounding gingiva normal in appearance without signs of dental abscess, handling secretions appropriately, no trismus  Eyes: Conjunctivae and EOM are normal. Pupils are equal, round, and reactive to light.  Neck: Normal range of motion.  Cardiovascular: Normal rate, regular rhythm and normal heart sounds.   Pulmonary/Chest: Effort normal and breath sounds normal. No respiratory distress. He has no wheezes.  Abdominal: Soft. Bowel sounds are normal.  Musculoskeletal: Normal range of motion.  Neurological: He is alert and oriented to person, place, and time.  Skin: Skin is warm and dry.  Psychiatric: He has a normal mood and affect.  Nursing note and vitals reviewed.   ED Course  Procedures (including critical care time)  DIAGNOSTIC STUDIES: Oxygen Saturation is 98% on RA, normal by my interpretation.    COORDINATION OF CARE: 9:09 PM-Discussed treatment plan which includes an antibiotic and pain medication with pt at bedside and pt agreed to plan. Advised pt to follow up with his dentist.   Labs Review Labs Reviewed - No data to display  Imaging Review No results found.   EKG Interpretation None      MDM   Final diagnoses:  Pain, dental   59 year old male here  with dental pain. He reports piece of his right upper molar broke off today while eating a corn dog. Patient has long-standing history of dental issues in very poor dentition at baseline.  On exam, right upper molar is broken with large cavity present. There is no surrounding gingival swelling or signs of abscess. Patient is handling his secretions well, no facial or neck swelling. No concern for Ludwig's angina.  Patient will be discharged home with antibiotic and pain medication. He is to follow-up with his dentist.  I personally performed the services described in this documentation, which was scribed in my presence. The recorded information has been reviewed and is accurate.  Larene Pickett, PA-C 01/03/15 2141  Leonard Schwartz, MD 01/06/15 [phone]

## 2015-01-03 NOTE — Discharge Instructions (Signed)
Take the prescribed medication as directed. Follow-up with your dentist. Return to the ED for new or worsening symptoms.

## 2015-01-03 NOTE — ED Notes (Signed)
Pt. reports right upper molar pain onset today unrelieved by OTC Ibuprofen tabs.

## 2015-01-10 ENCOUNTER — Emergency Department (INDEPENDENT_AMBULATORY_CARE_PROVIDER_SITE_OTHER)
Admission: EM | Admit: 2015-01-10 | Discharge: 2015-01-10 | Disposition: A | Discharge status: Home / Self Care | Payer: 59 | Source: Home / Self Care | Attending: Emergency Medicine | Admitting: Emergency Medicine

## 2015-01-10 ENCOUNTER — Encounter (HOSPITAL_COMMUNITY): Payer: Self-pay | Admitting: Emergency Medicine

## 2015-01-10 DIAGNOSIS — N390 Urinary tract infection, site not specified: Secondary | ICD-10-CM | POA: Diagnosis not present

## 2015-01-10 LAB — POCT URINALYSIS DIP (DEVICE)
Bilirubin Urine: NEGATIVE
Glucose, UA: 100 mg/dL — AB
Ketones, ur: NEGATIVE mg/dL
NITRITE: POSITIVE — AB
PH: 6 (ref 5.0–8.0)
Protein, ur: 30 mg/dL — AB
UROBILINOGEN UA: 0.2 mg/dL (ref 0.0–1.0)

## 2015-01-10 MED ORDER — TRAMADOL HCL 50 MG PO TABS: 50.0000 mg | ORAL_TABLET | Freq: Four times a day (QID) | ORAL | Status: DC | PRN

## 2015-01-10 MED ORDER — CIPROFLOXACIN HCL 500 MG PO TABS: 500.0000 mg | ORAL_TABLET | Freq: Two times a day (BID) | ORAL | Status: DC

## 2015-01-10 NOTE — ED Provider Notes (Signed)
CSN: [id number]     Arrival date & time 01/10/15  1300 History   First MD Initiated Contact with Patient 01/10/15 1458     Chief Complaint  Patient presents with  . Back Pain   (Consider location/radiation/quality/duration/timing/severity/associated sxs/prior Treatment) HPI He is a 59 year old man here for evaluation of low back pain. He states it started this morning around 4 AM. It is located across his lower back. It is associated with difficulty urinating, urgency, frequency. He denies dysuria or hematuria. No injury or trauma to the back. He reports subjective fever. No nausea or vomiting. No abdominal pain.  Past Medical History  Diagnosis Date  . Depression   . Anemia    Past Surgical History  Procedure Laterality Date  . Leg surgery      rod placed in left lef 12/2009  . Wrist surgery  2011   Family History  Problem Relation Age of Onset  . Diabetes Mother   . Kidney disease Mother   . Cancer Father   . Diabetes Maternal Aunt   . Heart disease Maternal Aunt   . Stroke Maternal Aunt    History  Substance Use Topics  . Smoking status: Current Every Day Smoker -- 0.50 packs/day for 40 years    Types: Cigarettes  . Smokeless tobacco: Never Used  . Alcohol Use: No    Review of Systems As in history of present illness Allergies  Review of patient's allergies indicates no known allergies.  Home Medications   Prior to Admission medications   Medication Sig Start Date End Date Taking? Authorizing Provider  ibuprofen (ADVIL,MOTRIN) 200 MG tablet Take 1 tablet every 6 hours as needed for pain 02/22/14  Yes Vijaya Mercer Pod, MD  Multiple Vitamins-Minerals (MULTIVITAMIN WITH MINERALS) tablet Take 1 tablet by mouth daily. 12/28/14 12/28/15 Yes Lucious Groves, DO  penicillin v potassium (VEETID) 500 MG tablet Take 1 tablet (500 mg total) by mouth 4 (four) times daily. 01/03/15  Yes Larene Pickett, PA-C  ciprofloxacin (CIPRO) 500 MG tablet Take 1 tablet (500 mg total) by  mouth 2 (two) times daily. 01/10/15   Melony Overly, MD  ranitidine (ZANTAC) 150 MG tablet Take 1 tablet (150 mg total) by mouth 2 (two) times daily. 02/22/14 02/22/15  Malena Catholic, MD  traMADol (ULTRAM) 50 MG tablet Take 1 tablet (50 mg total) by mouth every 6 (six) hours as needed. 01/10/15   Melony Overly, MD   BP 141/78 mmHg  Pulse 78  Temp(Src) 98.3 F (36.8 C) (Oral)  Resp 16  SpO2 99% Physical Exam  Constitutional: He is oriented to person, place, and time. He appears well-developed and well-nourished. No distress.  Cardiovascular: Normal rate.   Pulmonary/Chest: Effort normal.  Abdominal: Soft.  Genitourinary: Rectum normal and prostate normal.  Neurological: He is alert and oriented to person, place, and time.    ED Course  Procedures (including critical care time) Labs Review Labs Reviewed  POCT URINALYSIS DIP (DEVICE) - Abnormal; Notable for the following:    Glucose, UA 100 (*)    Hgb urine dipstick TRACE (*)    Protein, ur 30 (*)    Nitrite POSITIVE (*)    Leukocytes, UA TRACE (*)    All other components within normal limits  URINE CULTURE    Imaging Review No results found.   MDM   1. UTI (lower urinary tract infection)    Symptoms and urine concerning for UTI versus prostatitis. Will treat with a 2  week course of Cipro. Tramadol prescription provided to use all antibiotics take effect. Return precautions reviewed.    Melony Overly, MD 01/10/15 [phone]

## 2015-01-10 NOTE — Discharge Instructions (Signed)
You have an infection in your bladder or prostate. Take Cipro twice a day for 2 weeks. Use the tramadol every 6 hours as needed for severe pain while the antibiotics takes effect. Make sure you are drinking plenty of water. If you are unable to urinate for 24 hours or you develop fevers, please go to the emergency room.

## 2015-01-10 NOTE — ED Notes (Signed)
C/o  Lower back pain with urinary urgency and frequency.  Acute on set this a.m around 3.   Pt has taken a dose of ibuprofen with mild relief.  Denies injury and hematuria.  No fever, n /v/d

## 2015-01-13 LAB — URINE CULTURE: Colony Count: >=100000

## 2015-01-16 NOTE — ED Notes (Signed)
Urine culture: Enterobacter Aerogenes.  Pt. adequately treated with Cipro. Roselyn Meier 01/16/2015

## 2015-02-07 ENCOUNTER — Encounter: Payer: Self-pay | Admitting: *Deleted

## 2015-02-16 ENCOUNTER — Encounter: Payer: 59 | Admitting: Pulmonary Disease

## 2015-02-27 ENCOUNTER — Other Ambulatory Visit: Payer: Self-pay | Admitting: Internal Medicine

## 2015-09-11 ENCOUNTER — Emergency Department (HOSPITAL_COMMUNITY)
Admission: EM | Admit: 2015-09-11 | Discharge: 2015-09-11 | Disposition: A | Discharge status: Home / Self Care | Payer: 59 | Attending: Emergency Medicine | Admitting: Emergency Medicine

## 2015-09-11 ENCOUNTER — Encounter (HOSPITAL_COMMUNITY): Payer: Self-pay | Admitting: Emergency Medicine

## 2015-09-11 DIAGNOSIS — F329 Major depressive disorder, single episode, unspecified: Secondary | ICD-10-CM | POA: Diagnosis not present

## 2015-09-11 DIAGNOSIS — Z79899 Other long term (current) drug therapy: Secondary | ICD-10-CM | POA: Insufficient documentation

## 2015-09-11 DIAGNOSIS — K0889 Other specified disorders of teeth and supporting structures: Secondary | ICD-10-CM | POA: Diagnosis present

## 2015-09-11 DIAGNOSIS — Z862 Personal history of diseases of the blood and blood-forming organs and certain disorders involving the immune mechanism: Secondary | ICD-10-CM | POA: Insufficient documentation

## 2015-09-11 DIAGNOSIS — F17210 Nicotine dependence, cigarettes, uncomplicated: Secondary | ICD-10-CM | POA: Diagnosis not present

## 2015-09-11 DIAGNOSIS — K029 Dental caries, unspecified: Secondary | ICD-10-CM | POA: Diagnosis not present

## 2015-09-11 DIAGNOSIS — Z792 Long term (current) use of antibiotics: Secondary | ICD-10-CM | POA: Diagnosis not present

## 2015-09-11 MED ORDER — HYDROCODONE-ACETAMINOPHEN 5-325 MG PO TABS: ORAL_TABLET | ORAL | Status: DC

## 2015-09-11 MED ORDER — HYDROCODONE-ACETAMINOPHEN 5-325 MG PO TABS
1.0000 | ORAL_TABLET | Freq: Once | ORAL | Status: AC
Start: 2015-09-11 — End: 2015-09-11
  Administered 2015-09-11: 1 via ORAL
  Filled 2015-09-11: qty 1

## 2015-09-11 MED ORDER — AMOXICILLIN 500 MG PO CAPS
500.0000 mg | ORAL_CAPSULE | Freq: Once | ORAL | Status: AC
  Administered 2015-09-11: 500 mg via ORAL
  Filled 2015-09-11: qty 1

## 2015-09-11 MED ORDER — AMOXICILLIN 500 MG PO CAPS: 500.0000 mg | ORAL_CAPSULE | Freq: Three times a day (TID) | ORAL | Status: DC

## 2015-09-11 NOTE — Discharge Instructions (Signed)
For pain control please take ibuprofen (also known as Motrin or Advil) 800mg  (this is normally 4 over the counter pills) 3 times a day  for 5 days. Take with food to minimize stomach irritation.  Take vicodin for breakthrough pain, do not drink alcohol, drive, care for children or do other critical tasks while taking vicodin.  Return to the emergency room for fever, change in vision, redness to the face that rapidly spreads towards the eye, nausea or vomiting, difficulty swallowing or shortness of breath.   Apply warm compresses to jaw throughout the day.   Take your antibiotics as directed and to the end of the course.   Followup with a dentist is very important for ongoing evaluation and management of recurrent dental pain. Return to emergency department for emergent changing or worsening symptoms."  Low-cost dental clinic: [facility]  at [phone]**   You may also call [facility] If the dentist on-call cannot see you, please use the resources below:   Patients with Medicaid: [place] [address], [place] [address] [phone]  If unable to pay, or uninsured, contact [facility] [phone]) or [facility] [phone] in [place], [place] in [facility]) to become qualified for the adult dental clinic  Other [place]- [place], [place], [place], [address]    [phone], Ext. [phone]    2nd and 4th Thursday of the month at 6:30am    10 clients each day by appointment, can sometimes see walk-in     patients if someone does not show for an appointment [place], [place], [place], [address]    [phone] [facility]- [address], [place], [place], [address]    [phone]  [facility] Department- [phone] [place] [facility] Department-  [phone]

## 2015-09-11 NOTE — ED Notes (Signed)
Declined W/C at D/C and was escorted to lobby by RN. 

## 2015-09-11 NOTE — ED Provider Notes (Signed)
CSN: [id number]     Arrival date & time 09/11/15  0744 History   First MD Initiated Contact with Patient 09/11/15 [phone]     Chief Complaint  Patient presents with  . Dental Pain     (Consider location/radiation/quality/duration/timing/severity/associated sxs/prior Treatment) HPI   Blood pressure 132/91, pulse 71, temperature 98.1 F (36.7 C), temperature source Oral, resp. rate 20, height 5\' 9"  (1.753 m), weight 64.411 kg, SpO2 97 %.  Taylor Ochoa is a 59 y.o. male complaining of severe, 8 out of 10 right lower dental pain with associated swelling worsening over the course of last 2 days. Patient taken ibuprofen and acetaminophen at home with little relief. Denies fever/chills, difficulty opening jaw, difficulty swallowing, SOB, gum swelling, facial swelling, neck swelling.    Past Medical History  Diagnosis Date  . Depression   . Anemia    Past Surgical History  Procedure Laterality Date  . Leg surgery      rod placed in left lef 12/2009  . Wrist surgery  2011   Family History  Problem Relation Age of Onset  . Diabetes Mother   . Kidney disease Mother   . Cancer Father   . Diabetes Maternal Aunt   . Heart disease Maternal Aunt   . Stroke Maternal Aunt    Social History  Substance Use Topics  . Smoking status: Current Every Day Smoker -- 1.00 packs/day for 40 years    Types: Cigarettes  . Smokeless tobacco: Never Used  . Alcohol Use: No    Review of Systems  10 systems reviewed and found to be negative, except as noted in the HPI.   Allergies  Review of patient's allergies indicates no known allergies.  Home Medications   Prior to Admission medications   Medication Sig Start Date End Date Taking? Authorizing Provider  ciprofloxacin (CIPRO) 500 MG tablet Take 1 tablet (500 mg total) by mouth 2 (two) times daily. 01/10/15   Melony Overly, MD  ibuprofen (ADVIL,MOTRIN) 200 MG tablet Take 1 tablet every 6 hours as needed for pain 02/22/14   Malena Catholic,  MD  Multiple Vitamins-Minerals (MULTIVITAMIN WITH MINERALS) tablet Take 1 tablet by mouth daily. 12/28/14 12/28/15  Lucious Groves, DO  penicillin v potassium (VEETID) 500 MG tablet Take 1 tablet (500 mg total) by mouth 4 (four) times daily. 01/03/15   Larene Pickett, PA-C  ranitidine (ZANTAC) 150 MG tablet TAKE 1 TABLET BY MOUTH TWICE DAILY 02/28/15   Milagros Loll, MD  traMADol (ULTRAM) 50 MG tablet Take 1 tablet (50 mg total) by mouth every 6 (six) hours as needed. 01/10/15   Melony Overly, MD   BP 132/91 mmHg  Pulse 71  Temp(Src) 98.1 F (36.7 C) (Oral)  Resp 20  Ht 5\' 9"  (1.753 m)  Wt 64.411 kg  BMI 20.96 kg/m2  SpO2 97% Physical Exam  Constitutional: He is oriented to person, place, and time. He appears well-developed and well-nourished. No distress.  HENT:  Head: Normocephalic.  Generally poor dentition, no gingival swelling, erythema or tenderness to palpation. Patient is handling their secretions. There is no tenderness to palpation or firmness underneath tongue bilaterally. No trismus.    Eyes: Conjunctivae and EOM are normal.  Cardiovascular: Normal rate and regular rhythm.   Pulmonary/Chest: Effort normal and breath sounds normal. No stridor.  Musculoskeletal: Normal range of motion.  Neurological: He is alert and oriented to person, place, and time.  Psychiatric: He has a normal mood and affect.  Nursing note and vitals reviewed.   ED Course  Procedures (including critical care time) Labs Review Labs Reviewed - No data to display  Imaging Review No results found. I have personally reviewed and evaluated these images and lab results as part of my medical decision-making.   EKG Interpretation None      MDM   Final diagnoses:  Pain due to dental caries    Filed Vitals:   09/11/15 0756  BP: 132/91  Pulse: 71  Temp: 98.1 F (36.7 C)  TempSrc: Oral  Resp: 20  Height: 5\' 9"  (1.753 m)  Weight: 64.411 kg  SpO2: 97%    Medications   HYDROcodone-acetaminophen (NORCO/VICODIN) 5-325 MG per tablet 1 tablet (1 tablet Oral Given 09/11/15 0818)  amoxicillin (AMOXIL) capsule 500 mg (500 mg Oral Given 09/11/15 0818)    Taylor Ochoa is 59 y.o. male presenting with dental pain associated with dental caries but no signs or symptoms of dental abscess. patient states he feels swollen on that side however, there is no appreciable edema.  Patient afebrile, non toxic appearing and swallowing secretions well. I gave patient referral to dentist and stressed the importance of dental follow up for definitive management of dental issues. Patient voices understanding and is agreeable to plan.  Evaluation does not show pathology that would require ongoing emergent intervention or inpatient treatment. Pt is hemodynamically stable and mentating appropriately. Discussed findings and plan with patient/guardian, who agrees with care plan. All questions answered. Return precautions discussed and outpatient follow up given.   Discharge Medication List as of 09/11/2015  8:13 AM    START taking these medications   Details  amoxicillin (AMOXIL) 500 MG capsule Take 1 capsule (500 mg total) by mouth 3 (three) times daily., Starting 09/11/2015, Until Discontinued, Print    HYDROcodone-acetaminophen (NORCO/VICODIN) 5-325 MG tablet Take 1-2 tablets by mouth every 6 hours as needed for pain and/or cough., Print             [place], PA-C 09/11/15 [id number]  Sherwood Gambler, MD 09/15/15 [phone]

## 2015-09-11 NOTE — ED Notes (Signed)
Patient states broken teeth to L lower jaw.   Patient states pain x 2 days.   Patient states no dentist.   Patient has been seen here for dental pain previously.   Patient has been taking tylenol and ibuprofen for pain at home.

## 2015-11-27 ENCOUNTER — Encounter (HOSPITAL_COMMUNITY): Payer: Self-pay | Admitting: Emergency Medicine

## 2015-11-27 ENCOUNTER — Emergency Department (HOSPITAL_COMMUNITY)
Admission: EM | Admit: 2015-11-27 | Discharge: 2015-11-27 | Disposition: A | Discharge status: Home / Self Care | Payer: 59 | Attending: Emergency Medicine | Admitting: Emergency Medicine

## 2015-11-27 ENCOUNTER — Emergency Department (HOSPITAL_COMMUNITY): Payer: 59

## 2015-11-27 DIAGNOSIS — S8002XA Contusion of left knee, initial encounter: Secondary | ICD-10-CM | POA: Insufficient documentation

## 2015-11-27 DIAGNOSIS — S80212A Abrasion, left knee, initial encounter: Secondary | ICD-10-CM | POA: Insufficient documentation

## 2015-11-27 DIAGNOSIS — S8992XA Unspecified injury of left lower leg, initial encounter: Secondary | ICD-10-CM | POA: Diagnosis not present

## 2015-11-27 DIAGNOSIS — Z792 Long term (current) use of antibiotics: Secondary | ICD-10-CM | POA: Insufficient documentation

## 2015-11-27 DIAGNOSIS — Y9389 Activity, other specified: Secondary | ICD-10-CM | POA: Diagnosis not present

## 2015-11-27 DIAGNOSIS — F329 Major depressive disorder, single episode, unspecified: Secondary | ICD-10-CM | POA: Insufficient documentation

## 2015-11-27 DIAGNOSIS — F17210 Nicotine dependence, cigarettes, uncomplicated: Secondary | ICD-10-CM | POA: Insufficient documentation

## 2015-11-27 DIAGNOSIS — Z79899 Other long term (current) drug therapy: Secondary | ICD-10-CM | POA: Insufficient documentation

## 2015-11-27 DIAGNOSIS — Z862 Personal history of diseases of the blood and blood-forming organs and certain disorders involving the immune mechanism: Secondary | ICD-10-CM | POA: Diagnosis not present

## 2015-11-27 DIAGNOSIS — Y92410 Unspecified street and highway as the place of occurrence of the external cause: Secondary | ICD-10-CM | POA: Insufficient documentation

## 2015-11-27 DIAGNOSIS — Y990 Civilian activity done for income or pay: Secondary | ICD-10-CM | POA: Insufficient documentation

## 2015-11-27 DIAGNOSIS — Z9889 Other specified postprocedural states: Secondary | ICD-10-CM | POA: Diagnosis not present
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
## 2015-11-27 MED ORDER — HYDROCODONE-ACETAMINOPHEN 5-325 MG PO TABS
1.0000 | ORAL_TABLET | Freq: Once | ORAL | Status: AC
  Administered 2015-11-27: 1 via ORAL
  Filled 2015-11-27: qty 1

## 2015-11-27 MED ORDER — HYDROCODONE-ACETAMINOPHEN 5-325 MG PO TABS: 1.0000 | ORAL_TABLET | Freq: Four times a day (QID) | ORAL | Status: DC | PRN

## 2015-11-27 MED ORDER — IBUPROFEN 600 MG PO TABS: 600.0000 mg | ORAL_TABLET | Freq: Four times a day (QID) | ORAL | Status: DC | PRN
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
## 2015-11-27 NOTE — Discharge Instructions (Signed)
How to Use a Knee Brace °A knee brace is a device that you wear to support your knee, especially if the knee is healing after an injury or surgery. There are several types of knee braces. Some are designed to prevent an injury (prophylactic brace). These are often worn during sports. Others support an injured knee (functional brace) or keep it still while it heals (rehabilitative brace). People with severe arthritis of the knee may benefit from a brace that takes some pressure off the knee (unloader brace). Most knee braces are made from a combination of cloth and metal or plastic.  °You may need to wear a knee brace to: °· Relieve knee pain. °· Help your knee support your weight (improve stability). °· Help you walk farther (improve mobility). °· Prevent injury. °· Support your knee while it heals from surgery or from an injury. °RISKS AND COMPLICATIONS °Generally, knee braces are very safe to wear. However, problems may occur, including: °· Skin irritation that may lead to infection. °· Making your condition worse if you wear the brace in the wrong way. °HOW TO USE A KNEE BRACE °Different braces will have different instructions for use. Your health care provider will tell you or show you: °· How to put on your brace. °· How to adjust the brace. °· When and how often to wear the brace. °· How to remove the brace. °· If you will need any assistive devices in addition to the brace, such as crutches or a cane. °In general, your brace should: °· Have the hinge of the brace line up with the bend of your knee. °· Have straps, hooks, or tapes that fasten snugly around your leg. °· Not feel too tight or too loose.  °HOW TO CARE FOR A KNEE BRACE °· Check your brace often for signs of damage, such as loose connections or attachments. Your knee brace may get damaged or wear out during normal use. °· Wash the fabric parts of your brace with soap and water. °· Read the insert that comes with your brace for other specific care  instructions.  °SEEK MEDICAL CARE IF: °· Your knee brace is too loose or too tight and you cannot adjust it. °· Your knee brace causes skin redness, swelling, bruising, or irritation. °· Your knee brace is not helping. °· Your knee brace is making your knee pain worse. °  °This information is not intended to replace advice given to you by your health care provider. Make sure you discuss any questions you have with your health care provider. °  °Document Released: 11/30/2003 Document Revised: 05/31/2015 Document Reviewed: 01/02/2015 °Elsevier Interactive Patient Education ©2016 Elsevier Inc. ° °

## 2015-11-28 MED FILL — raNITIdine HCL 150 MG TABS: 150 | 30 days supply | Qty: 60 | Fill #3

## 2015-11-28 MED FILL — IBUPROFEN 600 MG TABLET: 600 | 8 days supply | Qty: 30 | Fill #0

## 2015-11-28 MED FILL — HYDROCODON-APAP 5-325: 5-325 | 2 days supply | Qty: 6 | Fill #0

## 2016-01-23 ENCOUNTER — Ambulatory Visit (HOSPITAL_COMMUNITY)
Admission: EM | Admit: 2016-01-23 | Discharge: 2016-01-23 | Disposition: A | Discharge status: Home / Self Care | Payer: 59 | Attending: Family Medicine | Admitting: Family Medicine

## 2016-01-23 ENCOUNTER — Encounter (HOSPITAL_COMMUNITY): Payer: Self-pay | Admitting: Emergency Medicine

## 2016-01-23 DIAGNOSIS — K089 Disorder of teeth and supporting structures, unspecified: Secondary | ICD-10-CM | POA: Diagnosis not present

## 2016-01-23 DIAGNOSIS — K0889 Other specified disorders of teeth and supporting structures: Secondary | ICD-10-CM | POA: Diagnosis not present

## 2016-01-23 DIAGNOSIS — K0510 Chronic gingivitis, plaque induced: Secondary | ICD-10-CM | POA: Diagnosis not present

## 2016-01-23 DIAGNOSIS — K047 Periapical abscess without sinus: Secondary | ICD-10-CM | POA: Diagnosis not present

## 2016-01-23 MED ORDER — AMOXICILLIN 500 MG PO CAPS: 1000.0000 mg | ORAL_CAPSULE | Freq: Two times a day (BID) | ORAL | Status: DC

## 2016-01-23 MED ORDER — HYDROCODONE-ACETAMINOPHEN 5-325 MG PO TABS: 1.0000 | ORAL_TABLET | ORAL | Status: DC | PRN

## 2016-01-23 MED FILL — AMOXICILLIN 500 MG CAPSULE: 500 | 8 days supply | Qty: 32 | Fill #0

## 2016-01-23 MED FILL — HYDROCODON-APAP 5-325: 5-325 | 3 days supply | Qty: 15 | Fill #0

## 2016-01-23 NOTE — ED Provider Notes (Signed)
CSN: [id number]     Arrival date & time 01/23/16  1338 History   First MD Initiated Contact with Patient 01/23/16 1459     Chief Complaint  Patient presents with  . Dental Pain   (Consider location/radiation/quality/duration/timing/severity/associated sxs/prior Treatment) HPI Comments: 60 year old male is complaining of toothache in the left upper row of teeth. He states this morning pain and swelling developed in the left face. He states there is pain and tenderness to the gums as well as the left side of his face.He does not have a dentist but thinks he has one that he can call to help arrange appointment and payments.  Patient is a 60 y.o. male presenting with tooth pain.  Dental Pain Associated symptoms: facial swelling   Associated symptoms: no drooling     Past Medical History  Diagnosis Date  . Depression   . Anemia    Past Surgical History  Procedure Laterality Date  . Leg surgery      rod placed in left lef 12/2009  . Wrist surgery  2011   Family History  Problem Relation Age of Onset  . Diabetes Mother   . Kidney disease Mother   . Cancer Father   . Diabetes Maternal Aunt   . Heart disease Maternal Aunt   . Stroke Maternal Aunt    Social History  Substance Use Topics  . Smoking status: Current Every Day Smoker -- 1.00 packs/day for 40 years    Types: Cigarettes  . Smokeless tobacco: Never Used  . Alcohol Use: No    Review of Systems  Constitutional: Negative.   HENT: Positive for dental problem and facial swelling. Negative for drooling, ear discharge and sore throat.   Respiratory: Negative.   Neurological: Negative.   All other systems reviewed and are negative.   Allergies  Review of patient's allergies indicates no known allergies.  Home Medications   Prior to Admission medications   Medication Sig Start Date End Date Taking? Authorizing Provider  amoxicillin (AMOXIL) 500 MG capsule Take 2 capsules (1,000 mg total) by mouth 2 (two) times daily.  01/23/16   Janne Napoleon, NP  HYDROcodone-acetaminophen (NORCO/VICODIN) 5-325 MG tablet Take 1 tablet by mouth every 4 (four) hours as needed. 01/23/16   Janne Napoleon, NP  ibuprofen (ADVIL,MOTRIN) 200 MG tablet Take 1 tablet every 6 hours as needed for pain 02/22/14   Malena Catholic, MD  ibuprofen (ADVIL,MOTRIN) 600 MG tablet Take 1 tablet (600 mg total) by mouth every 6 (six) hours as needed. 11/27/15   Delos Haring, PA-C  penicillin v potassium (VEETID) 500 MG tablet Take 1 tablet (500 mg total) by mouth 4 (four) times daily. 01/03/15   Larene Pickett, PA-C  ranitidine (ZANTAC) 150 MG tablet TAKE 1 TABLET BY MOUTH TWICE DAILY 02/28/15   Milagros Loll, MD  traMADol (ULTRAM) 50 MG tablet Take 1 tablet (50 mg total) by mouth every 6 (six) hours as needed. 01/10/15   Melony Overly, MD   Meds Ordered and Administered this Visit  Medications - No data to display  BP 135/85 mmHg  Pulse 74  Temp(Src) 97.3 F (36.3 C) (Oral)  Resp 18  SpO2 99% No data found.   Physical Exam  Constitutional: He appears well-developed and well-nourished. No distress.  HENT:  Mouth/Throat: Oropharynx is clear and moist. No oropharyngeal exudate.  Poor dentition. There remains approximately 8 teeth in his mouth. There are 2 teeth in the upper left maxilla. Both are tender. There is  swelling and hypertrophy of the gingival line.Positive for tenderness. No abscess is seen in the interim oral cavity. There is tenderness to the buccal mucosa. There is right facial tenderness with minor swelling.  Eyes: EOM are normal.  Neck: Normal range of motion. Neck supple.  Cardiovascular: Normal rate.   Pulmonary/Chest: Effort normal.  Lymphadenopathy:    He has no cervical adenopathy.  Neurological: He is alert.  Skin: Skin is warm and dry.  Nursing note and vitals reviewed.   ED Course  Procedures (including critical care time)  Labs Review Labs Reviewed - No data to display  Imaging Review No results  found.   Visual Acuity Review  Right Eye Distance:   Left Eye Distance:   Bilateral Distance:    Right Eye Near:   Left Eye Near:    Bilateral Near:         MDM   1. Pain, dental   2. Gingivitis   3. Poor dentition   4. Dental infection    Meds ordered this encounter  Medications  . amoxicillin (AMOXIL) 500 MG capsule    Sig: Take 2 capsules (1,000 mg total) by mouth 2 (two) times daily.    Dispense:  32 capsule    Refill:  0    Order Specific Question:  Supervising Provider    Answer:  Billy Fischer [phone]  . HYDROcodone-acetaminophen (NORCO/VICODIN) 5-325 MG tablet    Sig: Take 1 tablet by mouth every 4 (four) hours as needed.    Dispense:  15 tablet    Refill:  0    Order Specific Question:  Supervising Provider    Answer:  Billy Fischer [phone]   See dentist ASAP Resources given.    Janne Napoleon, NP 01/23/16 [phone]

## 2016-01-23 NOTE — Discharge Instructions (Signed)
Dental Pain Dental pain may be caused by many things, including:  Tooth decay (cavities or caries). Cavities expose the nerve of your tooth to air and hot or cold temperatures. This can cause pain or discomfort.  Abscess or infection. A dental abscess is a collection of infected pus from a bacterial infection in the inner part of the tooth (pulp). It usually occurs at the end of the tooth's root.  Injury.  An unknown reason (idiopathic). Your pain may be mild or severe. It may only occur when:  You are chewing.  You are exposed to hot or cold temperature.  You are eating or drinking sugary foods or beverages, such as soda or candy. Your pain may also be constant. HOME CARE INSTRUCTIONS Watch your dental pain for any changes. The following actions may help to lessen any discomfort that you are feeling:  Take medicines only as directed by your dentist.  If you were prescribed an antibiotic medicine, finish all of it even if you start to feel better.  Keep all follow-up visits as directed by your dentist. This is important.  Do not apply heat to the outside of your face.  Rinse your mouth or gargle with salt water if directed by your dentist. This helps with pain and swelling.  You can make salt water by adding  tsp of salt to 1 cup of warm water.  Apply ice to the painful area of your face:  Put ice in a plastic bag.  Place a towel between your skin and the bag.  Leave the ice on for 20 minutes, 2-3 times per day.  Avoid foods or drinks that cause you pain, such as:  Very hot or very cold foods or drinks.  Sweet or sugary foods or drinks. SEEK MEDICAL CARE IF:  Your pain is not controlled with medicines.  Your symptoms are worse.  You have new symptoms. SEEK IMMEDIATE MEDICAL CARE IF:  You are unable to open your mouth.  You are having trouble breathing or swallowing.  You have a fever.  Your face, neck, or jaw is swollen.   This information is not  intended to replace advice given to you by your health care provider. Make sure you discuss any questions you have with your health care provider.   Document Released: 09/09/2005 Document Revised: 01/24/2015 Document Reviewed: 09/05/2014 Elsevier Interactive Patient Education 2016 Elsevier Inc.  Gingivitis Gingivitis is a form of gum (periodontal) disease that causes redness, soreness, and swelling (inflammation) of your gums. CAUSES The most common cause of gingivitis is poor oral hygiene. A sticky substance made of bacteria, mucus, and food particles (plaque), is deposited on the exposed part of teeth. As plaque builds up, it reacts with the saliva in your mouth to form something called  tartar. Tartar is a hard deposit that becomes trapped around the base of the tooth. Plaque and tartar irritate the gums, leading to the formation of gingivitis. Other factors that increase your risk for gingivitis include:   Tobacco use.  Diabetes.  Older age.  Certain medications.  Certain viral or fungal infections.  Dry mouth.  Hormonal changes such as during pregnancy.  Poor nutrition.  Substance abuse.  Poor fitting dental restorations or appliances. SYMPTOMS You may notice inflammation of the soft tissue (gingiva) around the teeth. When these tissues become inflamed, they bleed easily, especially during flossing or brushing. The gums may also be:   Tender to the touch.  Bright red, purple red, or have a shiny  appearance.  Swollen.  Wearing away from the teeth (receding), which exposes more of the tooth. Bad breath is often present. Continued infection around teeth can eventually cause cavities and loosen teeth. This may lead to eventual tooth loss. DIAGNOSIS A medical and dental history will be taken. Your mouth, teeth, and gums will be examined. Your dentist will look for soft, swollen purple-red, irritated gums. There may be deposits of plaque and tartar at the base of the teeth.  Your gums will be looked at for the degree of redness, puffiness, and bleeding tendencies. Your dentist will see if any of the teeth are loose. X-rays may be taken to see if the inflammation has spread to the supporting structures of the teeth. TREATMENT The goal is to reduce and reverse the inflammation. Proper treatment can usually reverse the symptoms of gingivitis and prevent further progression of the disease. Have your teeth cleaned. During the cleaning, all plaque and tartar will be removed. Instruction for proper home care will be given. You will need regular professional cleanings and check-ups in the future. HOME CARE INSTRUCTIONS  Brush your teeth twice a day and floss at least once per day. When flossing, it is best to floss first then brush.  Limit sugar between meals and maintain a well-balanced diet.  Even the best dental hygiene will not prevent plaque from developing. It is necessary for you to see your dentist on a regular basis for cleaning and regular checkups.  Your dentist can recommend proper oral hygiene and mouth care and suggest special toothpastes or mouth rinses.  Stop smoking. SEEK DENTAL OR MEDICAL CARE IF:  You have painful, reddened tissue around your teeth, or you have puffy swollen gums.  You have difficulty chewing.  You notice any loose or infected teeth.  You have swollen glands.  Your gums bleed easily when you brush your teeth or are very tender to the touch.   This information is not intended to replace advice given to you by your health care provider. Make sure you discuss any questions you have with your health care provider.   Document Released: 03/05/2001 Document Revised: 12/02/2011 Document Reviewed: 04/24/2015 Elsevier Interactive Patient Education 2016 [place] Abscess A dental abscess is pus in or around a tooth. HOME CARE  Take medicines only as told by your dentist.  If you were prescribed antibiotic medicine,  finish all of it even if you start to feel better.  Rinse your mouth (gargle) often with salt water.  Do not drive or use heavy machinery, like a lawn mower, while taking pain medicine.  Do not apply heat to the outside of your mouth.  Keep all follow-up visits as told by your dentist. This is important. GET HELP IF:  Your pain is worse, and medicine does not help. GET HELP RIGHT AWAY IF:  You have a fever or chills.  Your symptoms suddenly get worse.  You have a very bad headache.  You have problems breathing or swallowing.  You have trouble opening your mouth.  You have puffiness (swelling) in your neck or around your eye.   This information is not intended to replace advice given to you by your health care provider. Make sure you discuss any questions you have with your health care provider.   Document Released: 01/24/2015 Document Reviewed: 01/24/2015 Elsevier Interactive Patient Education [employer].

## 2016-01-23 NOTE — ED Notes (Signed)
Complains of toothache.  Pain is upper, left tooth that patient reports is broken.

## 2016-03-19 ENCOUNTER — Other Ambulatory Visit: Payer: Self-pay | Admitting: *Deleted

## 2016-03-19 MED ORDER — RANITIDINE HCL 150 MG PO TABS: 150.0000 mg | ORAL_TABLET | Freq: Two times a day (BID) | ORAL | Status: DC

## 2016-03-19 NOTE — Telephone Encounter (Signed)
Refill request from pt's pharmacy:  Last office visit:  12/28/2014 Last Refill: 11/28/2015 Next appt: none scheduled   Refill request forwarded to pcp, please advise.Despina Hidden Cassady6/27/201711:39 AM

## 2016-03-20 MED FILL — raNITIdine HCL 150 MG TABS: 150 | 30 days supply | Qty: 60 | Fill #0

## 2016-03-27 ENCOUNTER — Encounter: Payer: Self-pay | Admitting: Pulmonary Disease

## 2016-04-14 ENCOUNTER — Emergency Department (HOSPITAL_COMMUNITY)
Admission: EM | Admit: 2016-04-14 | Discharge: 2016-04-14 | Disposition: A | Discharge status: Home / Self Care | Payer: 59 | Attending: Emergency Medicine | Admitting: Emergency Medicine

## 2016-04-14 ENCOUNTER — Emergency Department (HOSPITAL_COMMUNITY): Payer: 59

## 2016-04-14 ENCOUNTER — Encounter (HOSPITAL_COMMUNITY): Payer: Self-pay

## 2016-04-14 DIAGNOSIS — S80211A Abrasion, right knee, initial encounter: Secondary | ICD-10-CM | POA: Diagnosis not present

## 2016-04-14 DIAGNOSIS — Y939 Activity, unspecified: Secondary | ICD-10-CM | POA: Diagnosis not present

## 2016-04-14 DIAGNOSIS — S50811A Abrasion of right forearm, initial encounter: Secondary | ICD-10-CM | POA: Diagnosis not present

## 2016-04-14 DIAGNOSIS — R51 Headache: Secondary | ICD-10-CM | POA: Diagnosis not present

## 2016-04-14 DIAGNOSIS — E785 Hyperlipidemia, unspecified: Secondary | ICD-10-CM | POA: Diagnosis not present

## 2016-04-14 DIAGNOSIS — S0083XA Contusion of other part of head, initial encounter: Secondary | ICD-10-CM | POA: Diagnosis not present

## 2016-04-14 DIAGNOSIS — S59911A Unspecified injury of right forearm, initial encounter: Secondary | ICD-10-CM | POA: Diagnosis present

## 2016-04-14 DIAGNOSIS — K029 Dental caries, unspecified: Secondary | ICD-10-CM | POA: Insufficient documentation

## 2016-04-14 DIAGNOSIS — S61511A Laceration without foreign body of right wrist, initial encounter: Secondary | ICD-10-CM | POA: Insufficient documentation

## 2016-04-14 DIAGNOSIS — S40211A Abrasion of right shoulder, initial encounter: Secondary | ICD-10-CM | POA: Insufficient documentation

## 2016-04-14 DIAGNOSIS — S6991XA Unspecified injury of right wrist, hand and finger(s), initial encounter: Secondary | ICD-10-CM | POA: Diagnosis not present

## 2016-04-14 DIAGNOSIS — F17210 Nicotine dependence, cigarettes, uncomplicated: Secondary | ICD-10-CM | POA: Insufficient documentation

## 2016-04-14 DIAGNOSIS — M25462 Effusion, left knee: Secondary | ICD-10-CM | POA: Diagnosis not present

## 2016-04-14 DIAGNOSIS — S51811A Laceration without foreign body of right forearm, initial encounter: Secondary | ICD-10-CM | POA: Diagnosis not present

## 2016-04-14 DIAGNOSIS — S60511A Abrasion of right hand, initial encounter: Secondary | ICD-10-CM | POA: Diagnosis not present

## 2016-04-14 DIAGNOSIS — S80212A Abrasion, left knee, initial encounter: Secondary | ICD-10-CM | POA: Diagnosis not present

## 2016-04-14 DIAGNOSIS — M25531 Pain in right wrist: Secondary | ICD-10-CM | POA: Diagnosis not present

## 2016-04-14 DIAGNOSIS — M25561 Pain in right knee: Secondary | ICD-10-CM | POA: Diagnosis not present

## 2016-04-14 DIAGNOSIS — Y999 Unspecified external cause status: Secondary | ICD-10-CM | POA: Insufficient documentation

## 2016-04-14 DIAGNOSIS — S0990XA Unspecified injury of head, initial encounter: Secondary | ICD-10-CM | POA: Diagnosis not present

## 2016-04-14 DIAGNOSIS — S4991XA Unspecified injury of right shoulder and upper arm, initial encounter: Secondary | ICD-10-CM | POA: Diagnosis not present

## 2016-04-14 DIAGNOSIS — J320 Chronic maxillary sinusitis: Secondary | ICD-10-CM | POA: Diagnosis not present

## 2016-04-14 DIAGNOSIS — S0081XA Abrasion of other part of head, initial encounter: Secondary | ICD-10-CM | POA: Diagnosis not present

## 2016-04-14 DIAGNOSIS — M25511 Pain in right shoulder: Secondary | ICD-10-CM | POA: Diagnosis not present

## 2016-04-14 DIAGNOSIS — T07XXXA Unspecified multiple injuries, initial encounter: Secondary | ICD-10-CM

## 2016-04-14 DIAGNOSIS — Y92481 Parking lot as the place of occurrence of the external cause: Secondary | ICD-10-CM | POA: Insufficient documentation

## 2016-04-14 DIAGNOSIS — F329 Major depressive disorder, single episode, unspecified: Secondary | ICD-10-CM | POA: Diagnosis not present
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
## 2016-04-14 IMAGING — CR DG KNEE COMPLETE 4+V*R*
4 series · 4 of 4 positions shown · non-contrast
Comparison: Right knee radiograph [DATE].

CLINICAL DATA: Patient with right knee pain status post fall.
Initial encounter.

EXAM:
RIGHT KNEE - COMPLETE 4+ VIEW

[t knee ap right]
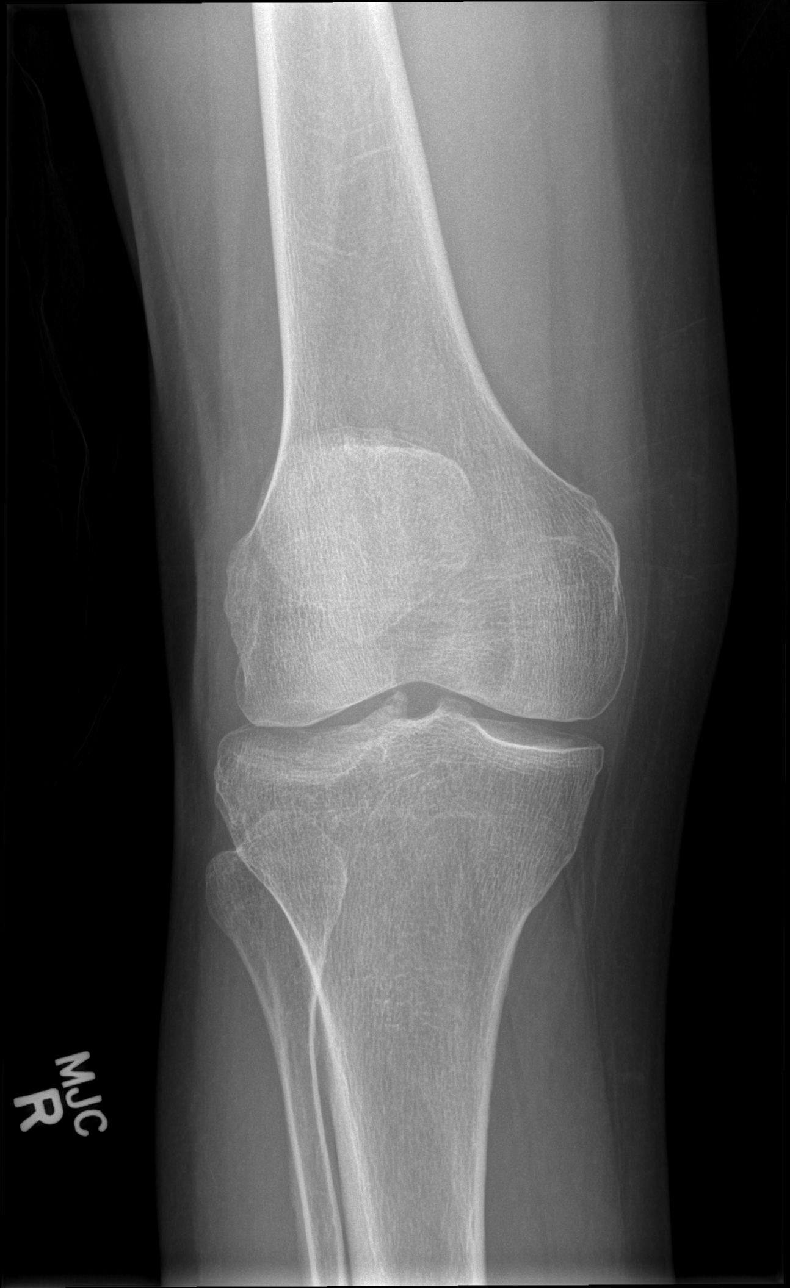

[t knee obl right (1 of 2)]
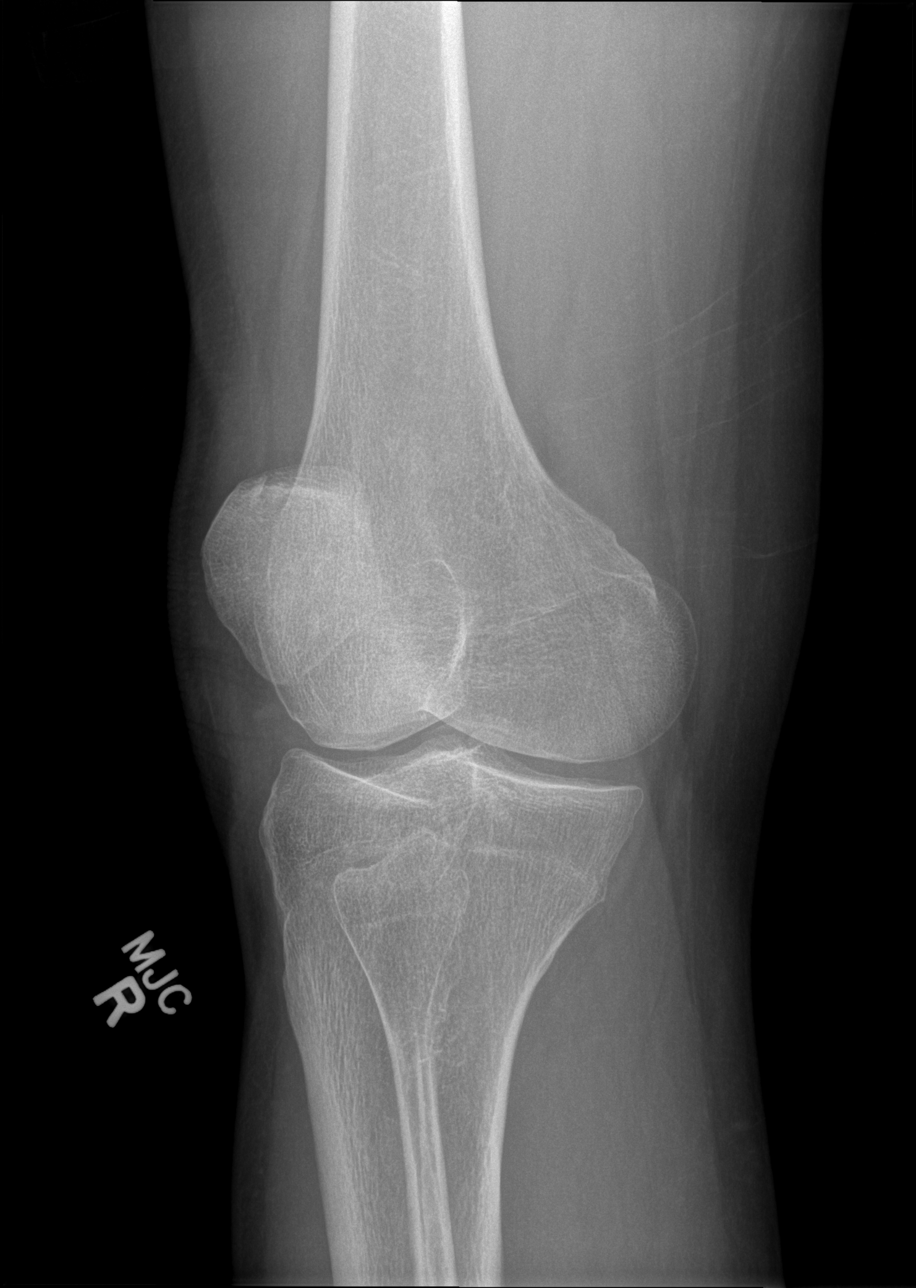

[t knee obl right (2 of 2)]
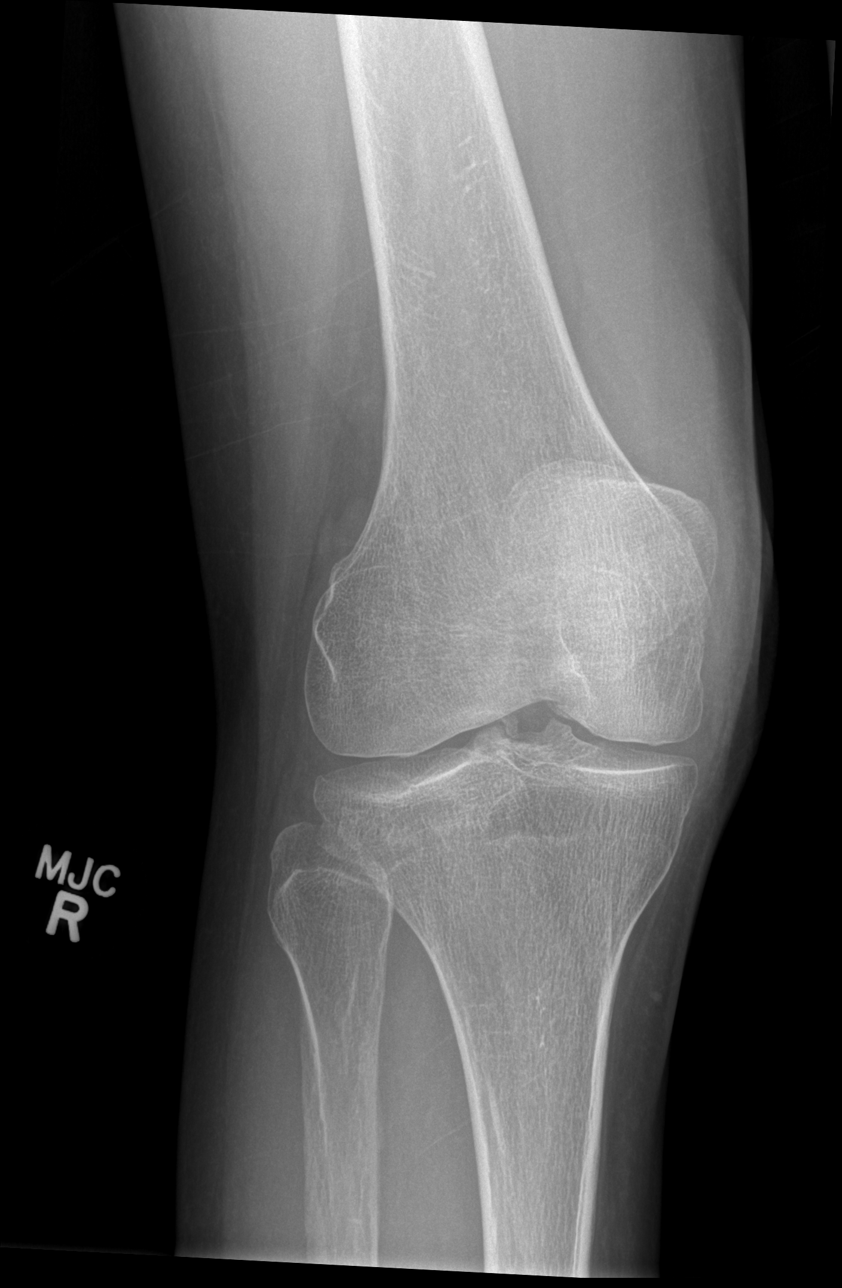

[t knee lat right]
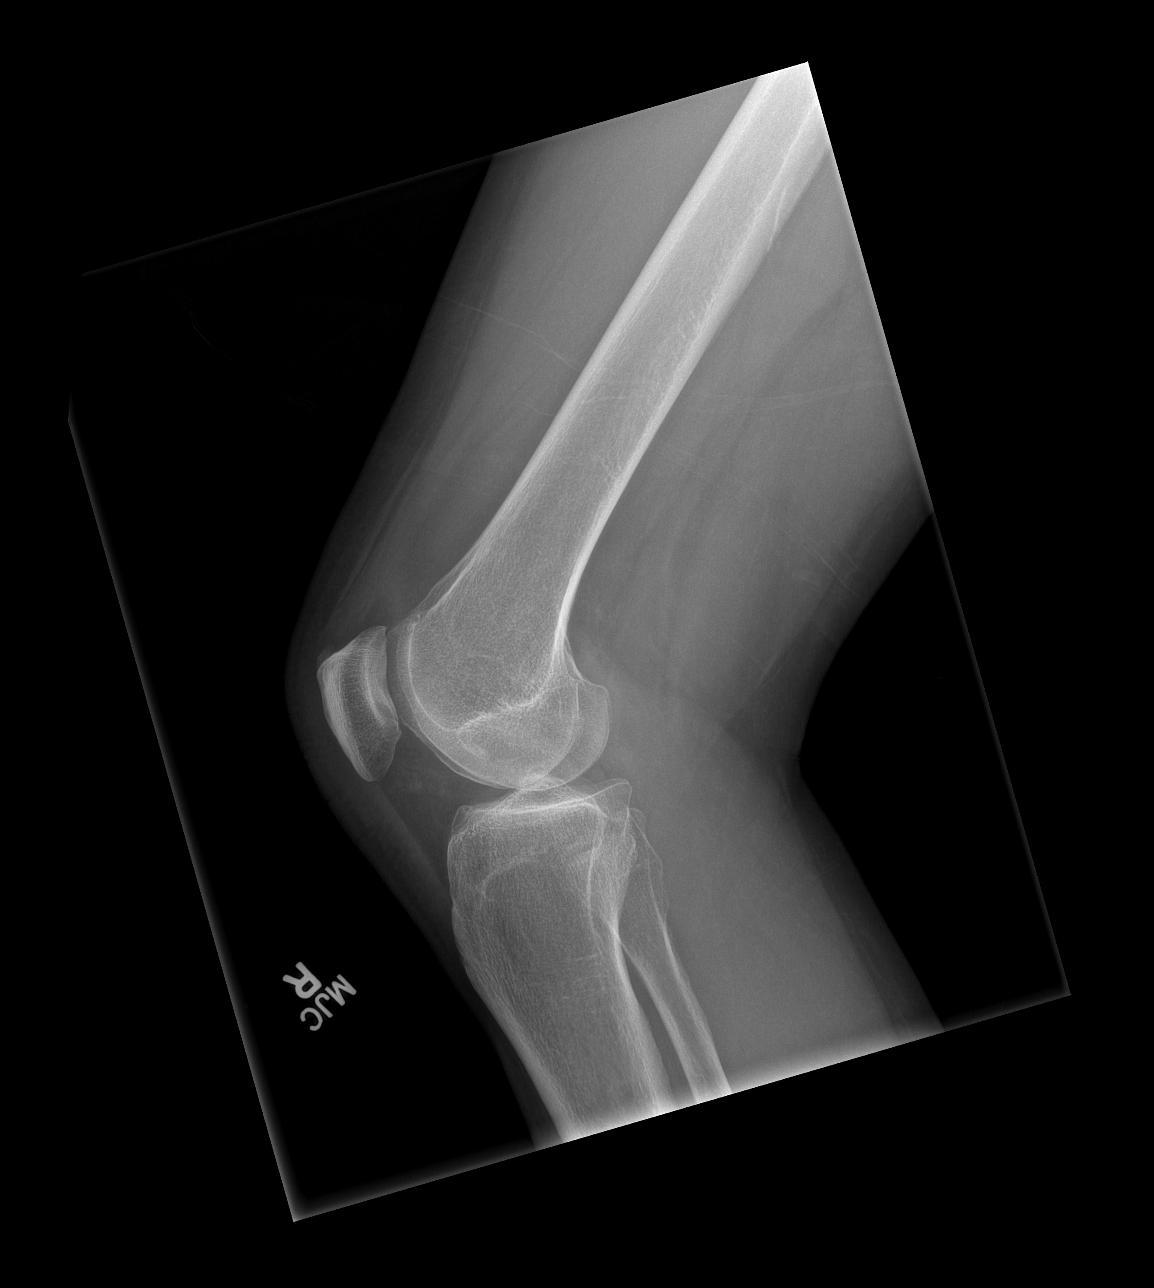

[4 of 4 positions shown; findings below may reference images not displayed]

FINDINGS: Normal anatomic alignment. No evidence for acute fracture
dislocation. Mild degenerative changes. No joint effusion. Regional
soft tissues unremarkable.
IMPRESSION: No acute osseous abnormality.
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
## 2016-04-14 MED ORDER — HYDROCODONE-ACETAMINOPHEN 5-325 MG PO TABS
1.0000 | ORAL_TABLET | Freq: Once | ORAL | Status: AC
  Administered 2016-04-14: 1 via ORAL
  Filled 2016-04-14: qty 1

## 2016-04-14 MED ORDER — CETIRIZINE HCL 10 MG PO TABS: 10.0000 mg | ORAL_TABLET | Freq: Every day | ORAL | 0 refills | Status: DC | PRN

## 2016-04-14 MED ORDER — METHOCARBAMOL 500 MG PO TABS: 500.0000 mg | ORAL_TABLET | Freq: Two times a day (BID) | ORAL | 0 refills | Status: DC

## 2016-04-14 MED ORDER — IBUPROFEN 200 MG PO TABS
600.0000 mg | ORAL_TABLET | Freq: Once | ORAL | Status: AC
  Administered 2016-04-14: 600 mg via ORAL
  Filled 2016-04-14: qty 3

## 2016-04-14 MED ORDER — HYDROCODONE-ACETAMINOPHEN 5-325 MG PO TABS: 1.0000 | ORAL_TABLET | Freq: Four times a day (QID) | ORAL | 0 refills | Status: DC | PRN

## 2016-04-14 MED ORDER — FLUTICASONE PROPIONATE 50 MCG/ACT NA SUSP: 2.0000 | Freq: Every day | NASAL | 0 refills | Status: DC

## 2016-04-14 MED ORDER — NAPROXEN 500 MG PO TABS: 500.0000 mg | ORAL_TABLET | Freq: Two times a day (BID) | ORAL | 0 refills | Status: DC

## 2016-04-14 MED ORDER — AMOXICILLIN-POT CLAVULANATE 875-125 MG PO TABS: 1.0000 | ORAL_TABLET | Freq: Two times a day (BID) | ORAL | 0 refills | Status: DC
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
## 2016-04-14 NOTE — ED Notes (Signed)
PT DISCHARGED. INSTRUCTIONS AND PRESCRIPTIONS GIVEN. AAOX4. PT IN NO APPARENT DISTRESS. THE OPPORTUNITY TO ASK QUESTIONS WAS PROVIDED. 

## 2016-04-14 NOTE — Discharge Instructions (Signed)
Your CT scans and x-rays showed no evidence of fracture. Take pain medication as prescribed as needed. Please follow up with your primary care provider this week. Call Dr. Berenice Primas' office to schedule an orthopedic follow up as well.  Your CT scan today did show evidence of sinusitis as well as many areas of dental infection. I will give you a prescription for antibiotics (Augmentin) as well as a couple prescriptions to help with your sinus congestion. Take as prescribed. When you follow up with your primary care provider please remember to talk to them about the findings.
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
## 2016-04-15 MED FILL — NAPROXEN 500 MG TABLET: 500 | 15 days supply | Qty: 30 | Fill #0

## 2016-04-15 MED FILL — HYDROCODON-APAP 5-325: 5-325 | 2 days supply | Qty: 10 | Fill #0

## 2016-04-18 ENCOUNTER — Encounter: Payer: Self-pay | Admitting: Pulmonary Disease

## 2016-04-18 ENCOUNTER — Encounter: Payer: 59 | Admitting: Pulmonary Disease

## 2016-07-04 ENCOUNTER — Ambulatory Visit (INDEPENDENT_AMBULATORY_CARE_PROVIDER_SITE_OTHER): Payer: 59 | Admitting: Pulmonary Disease

## 2016-07-04 ENCOUNTER — Encounter: Payer: Self-pay | Admitting: Pulmonary Disease

## 2016-07-04 VITALS — BP 135/76 | HR 64 | Temp 97.9°F | Ht 69.0 in | Wt 151.0 lb

## 2016-07-04 DIAGNOSIS — M545 Low back pain, unspecified: Secondary | ICD-10-CM

## 2016-07-04 DIAGNOSIS — R358 Other polyuria: Secondary | ICD-10-CM

## 2016-07-04 DIAGNOSIS — M25562 Pain in left knee: Secondary | ICD-10-CM | POA: Insufficient documentation

## 2016-07-04 DIAGNOSIS — R3121 Asymptomatic microscopic hematuria: Secondary | ICD-10-CM | POA: Insufficient documentation

## 2016-07-04 DIAGNOSIS — R3589 Other polyuria: Secondary | ICD-10-CM

## 2016-07-04 LAB — POCT URINALYSIS DIPSTICK
BILIRUBIN UA: NEGATIVE
GLUCOSE UA: NEGATIVE
Ketones, UA: NEGATIVE
Leukocytes, UA: NEGATIVE
Nitrite, UA: NEGATIVE
Protein, UA: NEGATIVE
SPEC GRAV UA: 1.025
Urobilinogen, UA: 0.2
pH, UA: 5.5

## 2016-07-04 MED ORDER — IBUPROFEN 600 MG PO TABS: 600.0000 mg | ORAL_TABLET | Freq: Four times a day (QID) | ORAL | 0 refills | Status: DC | PRN

## 2016-07-04 MED ORDER — CYCLOBENZAPRINE HCL 5 MG PO TABS: 5.0000 mg | ORAL_TABLET | Freq: Three times a day (TID) | ORAL | 0 refills | Status: DC | PRN

## 2016-07-04 MED FILL — IBUPROFEN 600 MG TABLET: 600 | 8 days supply | Qty: 30 | Fill #0

## 2016-07-04 NOTE — Assessment & Plan Note (Signed)
Assessment: Polyuria for the past few days. Urine dip with no signs of infection. Little blood.   Plan: Check BMP Recheck formal UA at follow up to assess for resolution of blood detected on dip

## 2016-07-04 NOTE — Progress Notes (Signed)
   CC: back pain  HPI:  Mr.Jonte L Hilbert is a 60 y.o. with history of depression presenting for evaluation of back pain.  Started 3 days ago. 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 No recent heavy lifting. No recent trauma. He has been urinating a lot more than usual. He has been drinking more coffee than usual too. No prolonged use of steroids. He has been taking Motrin which has helped a little. Tylenol has not helped. Back pain is located in lower back bilaterally. Does not radiate. Somewhat sharp. When he tries to get up to walk it worsens. Improves with sitting. He has had chronic leg pain. Denies paresthesias.   Declined flu vaccine  Past Medical History:  Diagnosis Date  . Anemia   . Depression     Review of Systems:   Constitutional: no fevers/chills Respiratory: no shortness of breath Cardiovascular: no chest pain Gastrointestinal: no nausea/vomiting, no abdominal pain, no constipation, no diarrhea Genitourinary: no dysuria, no hematuria  Physical Exam:  Vitals:   07/04/16 1025  BP: 135/76  Pulse: 64  Temp: 97.9 F (36.6 C)  TempSrc: Oral  SpO2: 99%  Weight: 151 lb (68.5 kg)  Height: 5\' 9"  (1.753 m)   General Apperance: NAD HEENT: Normocephalic, atraumatic, anicteric sclera Neck: Supple, trachea midline Lungs: Clear to auscultation bilaterally. No wheezes, rhonchi or rales. Breathing comfortably Heart: Regular rate and rhythm, no murmur/rub/gallop Abdomen: Soft, nontender, nondistended, no rebound/guarding.  Back: No CVA tenderness but tender to palpation in lower back bilaterally Extremities: Warm and well perfused, no edema Skin: No rashes or lesions Neurologic: Alert and interactive. No gross deficits.   Assessment & Plan:   See Encounters Tab for problem based charting.  Patient discussed with Dr. Dareen Piano

## 2016-07-04 NOTE — Assessment & Plan Note (Signed)
Assessment: Acute lower back pain. No red flags. No identified factors that caused the pain. Likely muscle strain.  Plan: Ibuprofen 600mg  q 6 hr prn Flexeril 5mg  TID prn breakthrough pain/muscle spasm Heat Continue activity as tolerated Return precautions discussed.

## 2016-07-04 NOTE — Patient Instructions (Addendum)
Take prescription ibuprofen up to 4 times a day. Do not take over the counter NSAIDs at the same time If the pain is not controlled after taking ibuprofen, you may take 1 tablet of Flexeril up to three times a day Use heat on the affected area. You may also buy over the counter cremes such as Bengay, IcyHot or [employer] moving and try not to sit for long periods of time. This will take about 4 weeks to resolve Follow up as needed if new problems arise or if the pain fails to improve Routine follow up in 3 months   Back Pain, Adult  Back pain is very common. The pain often gets better over time. The cause of back pain is usually not dangerous. Most people can learn to manage their back pain on their own.   HOME CARE  Watch your back pain for any changes. The following actions may help to lessen any pain you are feeling:  Stay active. Start with short walks on flat ground if you can. Try to walk farther each day.  Exercise regularly as told by your doctor. Exercise helps your back heal faster. It also helps avoid future injury by keeping your muscles strong and flexible.  Do not sit, drive, or stand in one place for more than 30 minutes.  Do not stay in bed. Resting more than 1-2 days can slow down your recovery.  Be careful when you bend or lift an object. Use good form when lifting:  Bend at your knees.  Keep the object close to your body.  Do not twist.  Sleep on a firm mattress. Lie on your side, and bend your knees. If you lie on your back, put a pillow under your knees.  Take medicines only as told by your doctor.  Put heat on the affected area. You can either buy a heat pack or put rice in a sock and microwave it for 1 minute and 30 seconds.  Maintain a healthy weight. Extra weight puts stress on your back. GET HELP IF:   You have pain that does not go away with rest or medicine.  You have worsening pain that goes down into your legs or buttocks.  You have pain  that does not get better in one week.  You have pain at night.  You lose weight.  You have a fever or chills. GET HELP RIGHT AWAY IF:   You cannot control when you poop (bowel movement) or pee (urinate).  Your arms or legs feel weak.  Your arms or legs lose feeling (numbness).  You feel sick to your stomach (nauseous) or throw up (vomit).  You have belly (abdominal) pain.  You feel like you may pass out (faint).   This information is not intended to replace advice given to you by your health care provider. Make sure you discuss any questions you have with your health care provider.   Document Released: 02/26/2008 Document Revised: 09/30/2014 Document Reviewed: 01/11/2014 Elsevier Interactive Patient Education [employer].

## 2016-07-05 LAB — BMP8+ANION GAP
Anion Gap: 15 mmol/L (ref 10.0–18.0)
BUN/Creatinine Ratio: 30 — ABNORMAL HIGH (ref 10–24)
BUN: 24 mg/dL (ref 8–27)
CO2: 24 mmol/L (ref 18–29)
Calcium: 9 mg/dL (ref 8.6–10.2)
Chloride: 102 mmol/L (ref 96–106)
Creatinine, Ser: 0.81 mg/dL (ref 0.76–1.27)
GFR, EST AFRICAN AMERICAN: 112 mL/min/{1.73_m2} (ref >59)
GFR, EST NON AFRICAN AMERICAN: 97 mL/min/{1.73_m2} (ref >59)
Glucose: 103 mg/dL — ABNORMAL HIGH (ref 65–99)
POTASSIUM: 4.4 mmol/L (ref 3.5–5.2)
SODIUM: 141 mmol/L (ref 134–144)

## 2016-07-07 NOTE — Progress Notes (Signed)
Internal Medicine Clinic Attending  Case discussed with Dr. Krall soon after the resident saw the patient.  We reviewed the resident's history and exam and pertinent patient test results.  I agree with the assessment, diagnosis, and plan of care documented in the resident's note. 

## 2016-07-09 ENCOUNTER — Encounter: Payer: Self-pay | Admitting: Pulmonary Disease

## 2016-08-12 ENCOUNTER — Ambulatory Visit: Payer: 59 | Admitting: Family

## 2016-10-24 ENCOUNTER — Ambulatory Visit (INDEPENDENT_AMBULATORY_CARE_PROVIDER_SITE_OTHER): Payer: 59 | Admitting: Pulmonary Disease

## 2016-10-24 ENCOUNTER — Encounter: Payer: Self-pay | Admitting: Pulmonary Disease

## 2016-10-24 VITALS — BP 149/75 | HR 70 | Temp 98.2°F | Ht 69.0 in | Wt 151.9 lb

## 2016-10-24 DIAGNOSIS — G8929 Other chronic pain: Secondary | ICD-10-CM

## 2016-10-24 DIAGNOSIS — M25562 Pain in left knee: Secondary | ICD-10-CM | POA: Diagnosis not present

## 2016-10-24 DIAGNOSIS — Z0000 Encounter for general adult medical examination without abnormal findings: Secondary | ICD-10-CM

## 2016-10-24 DIAGNOSIS — K219 Gastro-esophageal reflux disease without esophagitis: Secondary | ICD-10-CM

## 2016-10-24 DIAGNOSIS — R3121 Asymptomatic microscopic hematuria: Secondary | ICD-10-CM | POA: Diagnosis not present

## 2016-10-24 DIAGNOSIS — F419 Anxiety disorder, unspecified: Secondary | ICD-10-CM

## 2016-10-24 MED ORDER — ESOMEPRAZOLE MAGNESIUM 40 MG PO CPDR: 40.0000 mg | DELAYED_RELEASE_CAPSULE | Freq: Every day | ORAL | 1 refills | Status: DC

## 2016-10-24 MED ORDER — MELOXICAM 7.5 MG PO TABS: 7.5000 mg | ORAL_TABLET | Freq: Every day | ORAL | 0 refills | Status: DC

## 2016-10-24 MED ORDER — DICLOFENAC SODIUM 1 % TD GEL: 4.0000 g | Freq: Four times a day (QID) | TRANSDERMAL | 0 refills | Status: DC

## 2016-10-24 NOTE — Patient Instructions (Signed)
Stop taking ibuprofen and Zantac Start taking meloxicam once a day You may use Voltaren gel up to 4 times a day You may use ice on your knee Take esomeprazole once a day for your reflux Follow up in 4 to 6 weeks

## 2016-10-24 NOTE — Progress Notes (Signed)
   CC: follow up of microscopic hematuria  HPI:  Taylor Ochoa is a 61 y.o. man with history of depression presenting for follow up of microscopic hematuria.   He has acute on chronic left knee pain. He has been taking ibuprofen. Back pain has resolved. Has been going on for the last three weeks. He takes ibuprofen three times a day. He has tried extra strength tylenol. Denies warmth or redness. He has been using IcyHot which helps a little. Ice helps a little.   He was last seen in clinic 10/12 and was found to have small RBC on UA.   He has had reflux. Has been taking Zantac. Also thinks peanut butter worsens his GERD. Denies weight loss. No melena or hematochezia.    Past Medical History:  Diagnosis Date  . Anemia   . Depression     Review of Systems:   No fevers or chills No dysuria. Polyuria unchanged.   Physical Exam:  Vitals:   10/24/16 1329  BP: (!) 149/75  Pulse: 70  Temp: 98.2 F (36.8 C)  TempSrc: Oral  SpO2: 99%  Weight: 151 lb 14.4 oz (68.9 kg)  Height: 5\' 9"  (1.753 m)   General Apperance: NAD HEENT: Normocephalic, atraumatic, anicteric sclera Neck: Supple, trachea midline Lungs: Clear to auscultation bilaterally. No wheezes, rhonchi or rales. Breathing comfortably Heart: Regular rate and rhythm, no murmur/rub/gallop Abdomen: Soft, nontender, nondistended, no rebound/guarding Extremities: Warm and well perfused, no edema Skin: No rashes or lesions Neurologic: Alert and interactive. No gross deficits.   Assessment & Plan:   See Encounters Tab for problem based charting.  Patient discussed with Dr. Beryle Beams

## 2016-10-25 DIAGNOSIS — F419 Anxiety disorder, unspecified: Secondary | ICD-10-CM | POA: Insufficient documentation

## 2016-10-25 LAB — URINALYSIS, ROUTINE W REFLEX MICROSCOPIC
Bilirubin, UA: NEGATIVE
GLUCOSE, UA: NEGATIVE
KETONES UA: NEGATIVE
Leukocytes, UA: NEGATIVE
NITRITE UA: NEGATIVE
Protein, UA: NEGATIVE
RBC UA: NEGATIVE
SPEC GRAV UA: 1.02 (ref 1.005–1.030)
UUROB: 0.2 mg/dL (ref 0.2–1.0)
pH, UA: 8.5 — ABNORMAL HIGH (ref 5.0–7.5)

## 2016-10-25 NOTE — Assessment & Plan Note (Signed)
Assessment: Repeat UA with no RBC. Likely previously seen RBC transient.  Plan: Resolved.

## 2016-10-25 NOTE — Assessment & Plan Note (Signed)
Exacerbation of his GERD due to ibuprofen use. Will try to decrease oral NSAID use. Stop Zantac. Start esomeprazole 40 mg daily for 4 to 6 weeks. Then go back to Zantac.

## 2016-10-25 NOTE — Assessment & Plan Note (Signed)
Assessment: lower back pain resolved. He has acute on chronic left knee pain. He has been taking ibuprofen 600mg  about three times a day. Pain most likely OA.  Plan: Stop ibuprofen Start meloxicam 7.5 mg daily Can take extra strength Tylenol as needed Voltaren gel up to 4 times a day

## 2016-10-25 NOTE — Assessment & Plan Note (Signed)
He will consider colonoscopy. Will address again next follow-up appointment.

## 2016-10-25 NOTE — Progress Notes (Signed)
Medicine attending: Medical history, presenting problems, physical findings, and medications, reviewed with resident physician Dr Jennifer Krall on the day of the patient visit and I concur with her evaluation and management plan. 

## 2016-10-25 NOTE — Assessment & Plan Note (Signed)
He has anxiety that wakes him up at night. Will discuss further at next appointment. Recommended improving sleep hygiene in the meantime.

## 2016-10-28 MED FILL — DICLOFENAC SODIUM 1% GEL: 1 | 30 days supply | Qty: 100 | Fill #0

## 2016-10-28 MED FILL — ESOMEPRAZOLE MAG DR 40 MG C: 40 | 30 days supply | Qty: 30 | Fill #0

## 2016-10-28 MED FILL — MELOXICAM 7.5 MG TABLET: 7.5 | 21 days supply | Qty: 21 | Fill #0

## 2017-01-14 DIAGNOSIS — H524 Presbyopia: Secondary | ICD-10-CM | POA: Diagnosis not present

## 2017-01-14 DIAGNOSIS — H5213 Myopia, bilateral: Secondary | ICD-10-CM | POA: Diagnosis not present

## 2017-01-14 DIAGNOSIS — H52223 Regular astigmatism, bilateral: Secondary | ICD-10-CM | POA: Diagnosis not present

## 2017-03-10 ENCOUNTER — Encounter: Payer: Self-pay | Admitting: *Deleted

## 2017-05-14 DIAGNOSIS — H35363 Drusen (degenerative) of macula, bilateral: Secondary | ICD-10-CM | POA: Diagnosis not present

## 2017-05-14 DIAGNOSIS — H25041 Posterior subcapsular polar age-related cataract, right eye: Secondary | ICD-10-CM | POA: Diagnosis not present

## 2017-05-14 DIAGNOSIS — H25013 Cortical age-related cataract, bilateral: Secondary | ICD-10-CM | POA: Diagnosis not present

## 2017-05-14 DIAGNOSIS — H25011 Cortical age-related cataract, right eye: Secondary | ICD-10-CM | POA: Diagnosis not present

## 2017-05-14 DIAGNOSIS — H2513 Age-related nuclear cataract, bilateral: Secondary | ICD-10-CM | POA: Diagnosis not present

## 2017-05-14 DIAGNOSIS — H2511 Age-related nuclear cataract, right eye: Secondary | ICD-10-CM | POA: Diagnosis not present

## 2017-06-06 MED FILL — OFLOXACIN 0.3% EYE DROPS: 0.3 | 13 days supply | Qty: 5 | Fill #0

## 2017-06-06 MED FILL — KETOROLAC 0.5% OPHTH SOLN: 0.5 | 25 days supply | Qty: 5 | Fill #0

## 2017-06-06 MED FILL — PREDNISOLONE AC 1% EYE DROP: 1 | 25 days supply | Qty: 5 | Fill #0

## 2017-06-06 MED FILL — BRIMONIDINE 0.2% EYE DROP: 0.2 | 90 days supply | Qty: 10 | Fill #0

## 2017-06-17 DIAGNOSIS — H2511 Age-related nuclear cataract, right eye: Secondary | ICD-10-CM | POA: Diagnosis not present

## 2017-06-17 DIAGNOSIS — H25041 Posterior subcapsular polar age-related cataract, right eye: Secondary | ICD-10-CM | POA: Diagnosis not present

## 2017-06-17 DIAGNOSIS — H25011 Cortical age-related cataract, right eye: Secondary | ICD-10-CM | POA: Diagnosis not present

## 2017-07-02 DIAGNOSIS — H25041 Posterior subcapsular polar age-related cataract, right eye: Secondary | ICD-10-CM | POA: Diagnosis not present

## 2017-07-02 DIAGNOSIS — H2589 Other age-related cataract: Secondary | ICD-10-CM | POA: Diagnosis not present

## 2017-07-02 DIAGNOSIS — H2511 Age-related nuclear cataract, right eye: Secondary | ICD-10-CM | POA: Diagnosis not present

## 2017-07-02 DIAGNOSIS — H25011 Cortical age-related cataract, right eye: Secondary | ICD-10-CM | POA: Diagnosis not present

## 2017-07-02 MED FILL — KETOROLAC 0.5% OPHTH SOLN: 0.5 | 25 days supply | Qty: 5 | Fill #0

## 2017-07-02 MED FILL — PREDNISOLONE AC 1% EYE DROP: 1 | 25 days supply | Qty: 5 | Fill #0

## 2017-07-02 MED FILL — OFLOXACIN 0.3% EYE DROPS: 0.3 | 25 days supply | Qty: 10 | Fill #0

## 2017-07-04 MED FILL — ESOMEPRAZOLE MAG DR 40 MG C: 40 | 30 days supply | Qty: 30 | Fill #1

## 2017-07-08 DIAGNOSIS — H25812 Combined forms of age-related cataract, left eye: Secondary | ICD-10-CM | POA: Diagnosis not present

## 2017-07-08 DIAGNOSIS — H2512 Age-related nuclear cataract, left eye: Secondary | ICD-10-CM | POA: Diagnosis not present

## 2017-09-23 DIAGNOSIS — S32401D Unspecified fracture of right acetabulum, subsequent encounter for fracture with routine healing: Secondary | ICD-10-CM | POA: Diagnosis not present

## 2017-09-23 DIAGNOSIS — S37032D Laceration of left kidney, unspecified degree, subsequent encounter: Secondary | ICD-10-CM | POA: Diagnosis not present

## 2017-09-23 DIAGNOSIS — S329XXD Fracture of unspecified parts of lumbosacral spine and pelvis, subsequent encounter for fracture with routine healing: Secondary | ICD-10-CM | POA: Diagnosis not present

## 2017-09-23 DIAGNOSIS — M6281 Muscle weakness (generalized): Secondary | ICD-10-CM | POA: Diagnosis not present

## 2017-09-23 DIAGNOSIS — S0181XA Laceration without foreign body of other part of head, initial encounter: Secondary | ICD-10-CM | POA: Diagnosis not present

## 2017-09-23 DIAGNOSIS — R52 Pain, unspecified: Secondary | ICD-10-CM | POA: Diagnosis not present

## 2017-09-23 DIAGNOSIS — S32301D Unspecified fracture of right ilium, subsequent encounter for fracture with routine healing: Secondary | ICD-10-CM | POA: Diagnosis not present

## 2017-09-23 DIAGNOSIS — R2689 Other abnormalities of gait and mobility: Secondary | ICD-10-CM | POA: Diagnosis not present

## 2017-09-23 DIAGNOSIS — K219 Gastro-esophageal reflux disease without esophagitis: Secondary | ICD-10-CM | POA: Diagnosis not present

## 2017-10-06 ENCOUNTER — Encounter: Payer: 59 | Admitting: Internal Medicine

## 2017-11-20 DIAGNOSIS — H40013 Open angle with borderline findings, low risk, bilateral: Secondary | ICD-10-CM | POA: Diagnosis not present

## 2017-12-14 ENCOUNTER — Emergency Department: Payer: 59

## 2017-12-14 ENCOUNTER — Emergency Department
Admission: EM | Admit: 2017-12-14 | Discharge: 2017-12-15 | Disposition: A | Discharge status: Home / Self Care | Payer: 59 | Attending: Emergency Medicine | Admitting: Emergency Medicine

## 2017-12-14 ENCOUNTER — Other Ambulatory Visit: Payer: Self-pay

## 2017-12-14 DIAGNOSIS — R531 Weakness: Secondary | ICD-10-CM | POA: Diagnosis not present

## 2017-12-14 DIAGNOSIS — Z79899 Other long term (current) drug therapy: Secondary | ICD-10-CM | POA: Diagnosis not present

## 2017-12-14 DIAGNOSIS — R42 Dizziness and giddiness: Secondary | ICD-10-CM | POA: Diagnosis not present

## 2017-12-14 DIAGNOSIS — F17210 Nicotine dependence, cigarettes, uncomplicated: Secondary | ICD-10-CM | POA: Insufficient documentation

## 2017-12-14 DIAGNOSIS — F419 Anxiety disorder, unspecified: Secondary | ICD-10-CM | POA: Insufficient documentation

## 2017-12-14 DIAGNOSIS — F329 Major depressive disorder, single episode, unspecified: Secondary | ICD-10-CM | POA: Diagnosis not present

## 2017-12-14 LAB — URINALYSIS, COMPLETE (UACMP) WITH MICROSCOPIC
Bacteria, UA: NONE SEEN
Bilirubin Urine: NEGATIVE
Glucose, UA: 50 mg/dL — AB
Hgb urine dipstick: NEGATIVE
Ketones, ur: NEGATIVE mg/dL
Leukocytes, UA: NEGATIVE
Nitrite: NEGATIVE
Protein, ur: NEGATIVE mg/dL
Specific Gravity, Urine: 1.03 (ref 1.005–1.030)
pH: 5 (ref 5.0–8.0)

## 2017-12-14 LAB — CBC
HCT: 42.3 % (ref 40.0–52.0)
Hemoglobin: 14.3 g/dL (ref 13.0–18.0)
MCH: 32.8 pg (ref 26.0–34.0)
MCHC: 33.9 g/dL (ref 32.0–36.0)
MCV: 96.7 fL (ref 80.0–100.0)
Platelets: 250 10*3/uL (ref 150–440)
RBC: 4.38 MIL/uL — ABNORMAL LOW (ref 4.40–5.90)
RDW: 14.3 % (ref 11.5–14.5)
WBC: 7.3 10*3/uL (ref 3.8–10.6)

## 2017-12-14 LAB — BASIC METABOLIC PANEL
ANION GAP: 9 (ref 5–15)
BUN: 21 mg/dL — ABNORMAL HIGH (ref 6–20)
CHLORIDE: 107 mmol/L (ref 101–111)
CO2: 24 mmol/L (ref 22–32)
Calcium: 8.6 mg/dL — ABNORMAL LOW (ref 8.9–10.3)
Creatinine, Ser: 0.9 mg/dL (ref 0.61–1.24)
GFR calc non Af Amer: >60 mL/min (ref >60)
Glucose, Bld: 124 mg/dL — ABNORMAL HIGH (ref 65–99)
POTASSIUM: 3.6 mmol/L (ref 3.5–5.1)
SODIUM: 140 mmol/L (ref 135–145)

## 2017-12-14 LAB — TROPONIN I
Troponin I: <0.03 ng/mL (ref <0.03)
Troponin I: <0.03 ng/mL (ref <0.03)

## 2017-12-14 IMAGING — CT CT HEAD W/O CM
3 series · 16 of 47 positions shown, 19 images · non-contrast
Comparison: Head CT dated [DATE]

CLINICAL DATA: 61-year-old male with dizziness.

EXAM:
CT HEAD WITHOUT CONTRAST
TECHNIQUE: Contiguous axial images were obtained from the base of the skull
through the vertex without intravenous contrast.

[Series 3: head wo · axial · 0.40mm/px · z∈[+851,+976]mm · 10 of 30 slices shown, 13 images]
[im 3/30  brain]
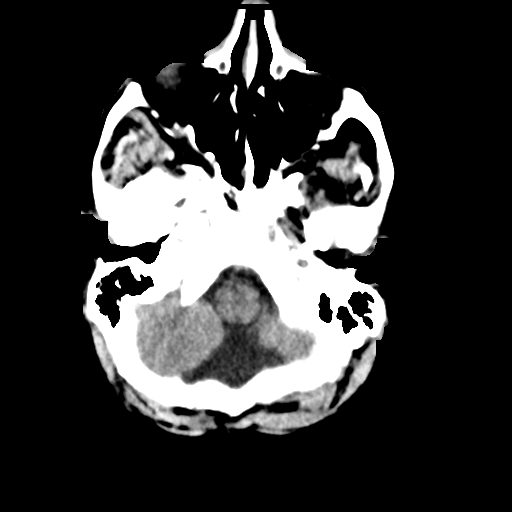
[im 3/30  bone]
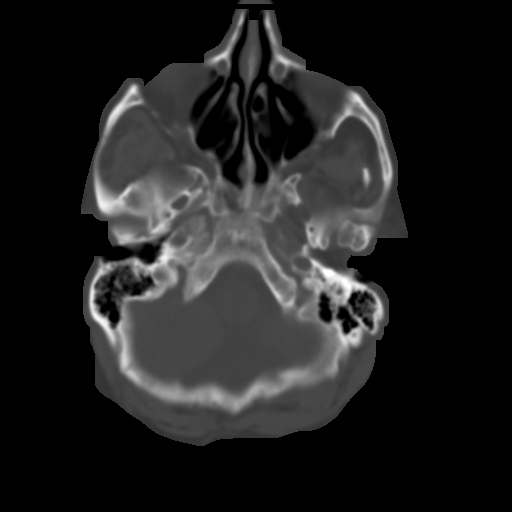
[im 6/30  brain]
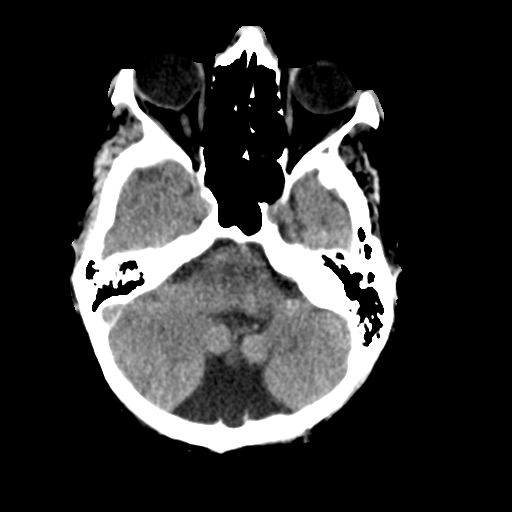
[im 9/30  brain]
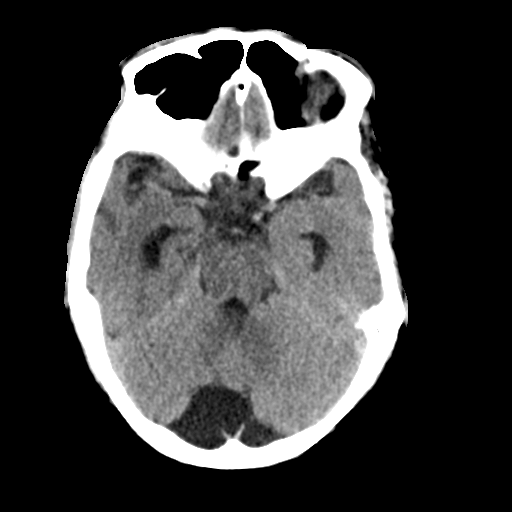
[im 11/30  brain]
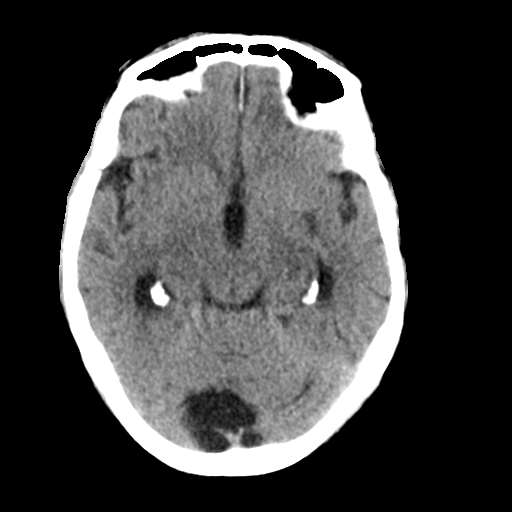
[im 14/30  brain]
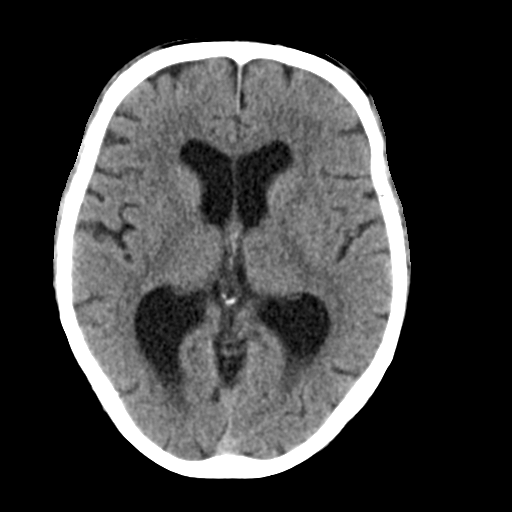
[im 14/30  bone]
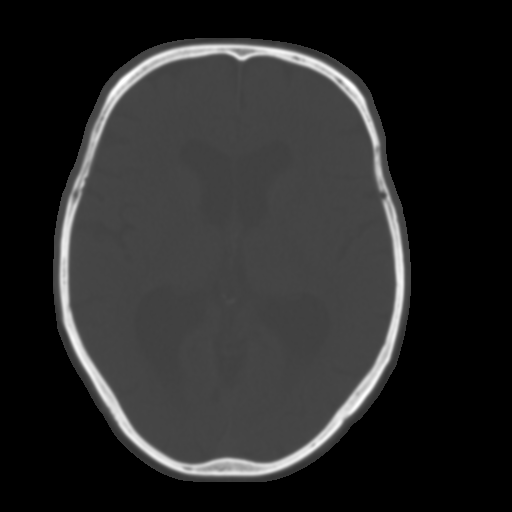
[im 17/30  brain]
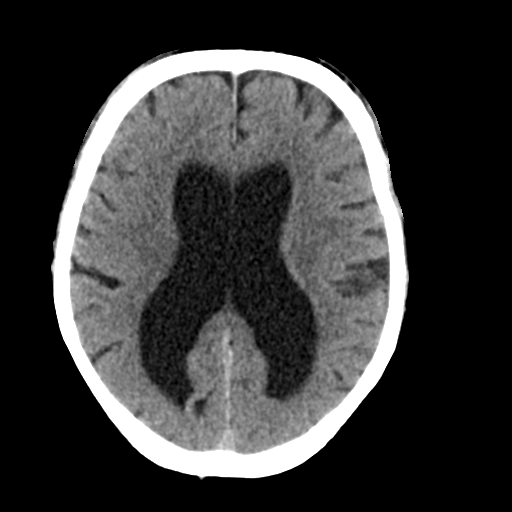
[im 20/30  brain]
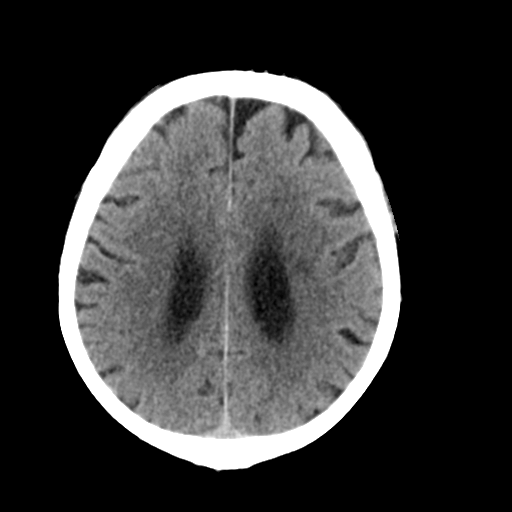
[im 23/30  brain]
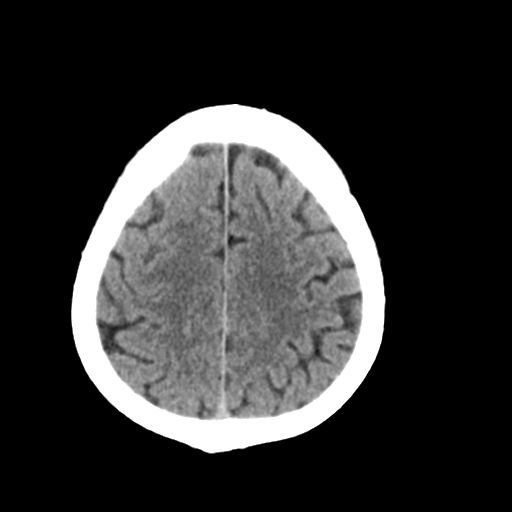
[im 25/30  brain]
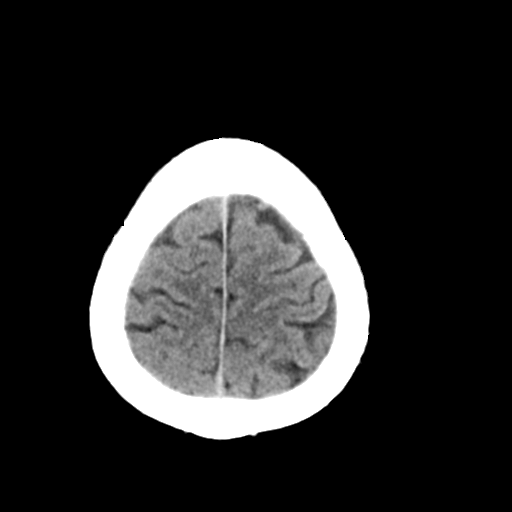
[im 25/30  bone]
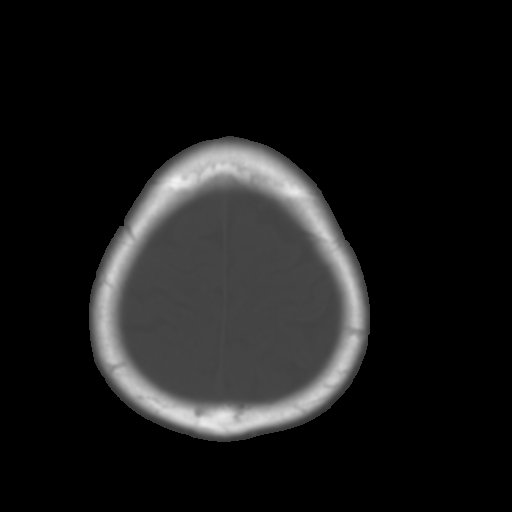
[im 28/30  brain]
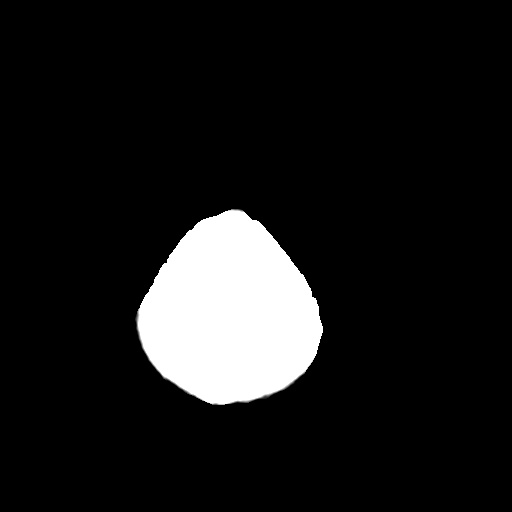

[Series 4: coronal soft tissue · coronal · 0.29mm/px · 3 of 63 slices shown]
[im 21/63  brain]
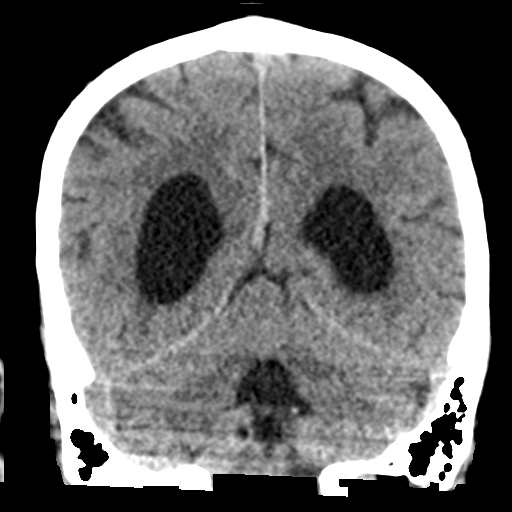
[im 28/63  brain]
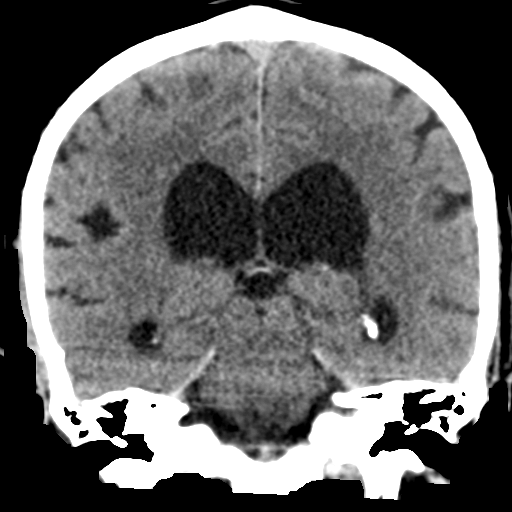
[im 35/63  brain]
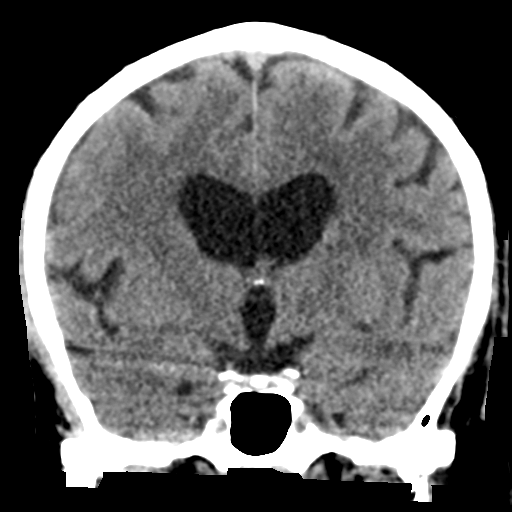

[Series 5: sagittal soft tissue · sagittal · 0.30mm/px · 3 of 51 slices shown]
[im 17/51  brain]
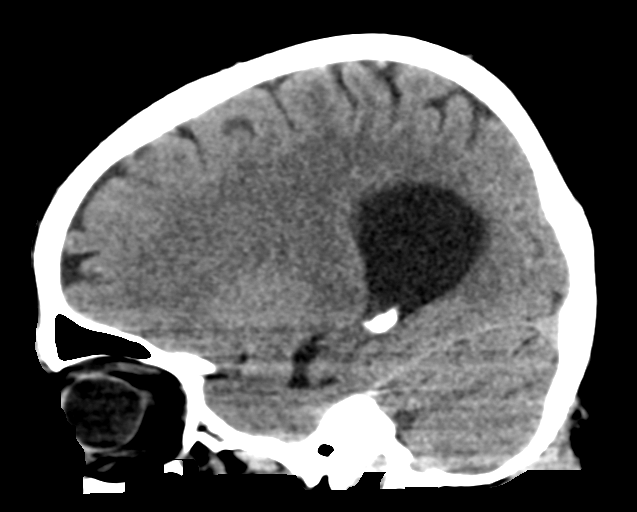
[im 26/51  brain]
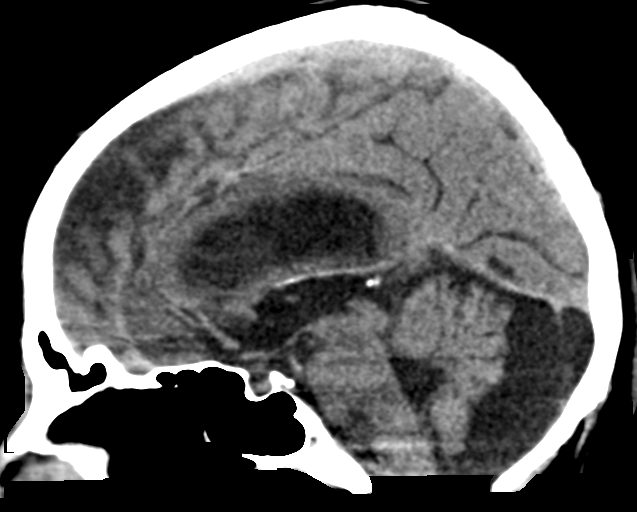
[im 34/51  brain]
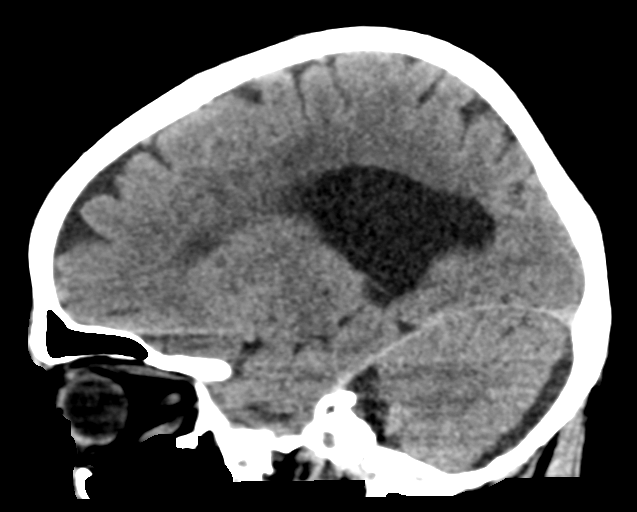

[16 of 47 positions shown; findings below may reference images not displayed]

FINDINGS: Brain: There is stable dilatation of the ventricles similar to prior
CT. Posterior fossa arachnoid cyst or mega cisterna magna similar to
prior CT. Mild periventricular and deep white matter chronic
microvascular ischemic changes noted. There is no acute intracranial
hemorrhage. No mass effect or midline shift. No extra-axial fluid
collection.

Vascular: No hyperdense vessel or unexpected calcification.

Skull: Normal. Negative for fracture or focal lesion.

Sinuses/Orbits: No acute finding.

Other: None
IMPRESSION: 1. No acute intracranial pathology.
2. Stable age-related atrophy and prominence of the ventricles and
posterior fossa CSF spaces as described.

## 2017-12-14 IMAGING — DX DG CHEST 1V PORT
1 series · 1 of 1 positions shown · non-contrast
Comparison: [DATE] and earlier.

CLINICAL DATA: 61-year-old male with dizziness, weakness and facial
erythema at [7I] hours today.

EXAM:
PORTABLE CHEST 1 VIEW

[chest ap]
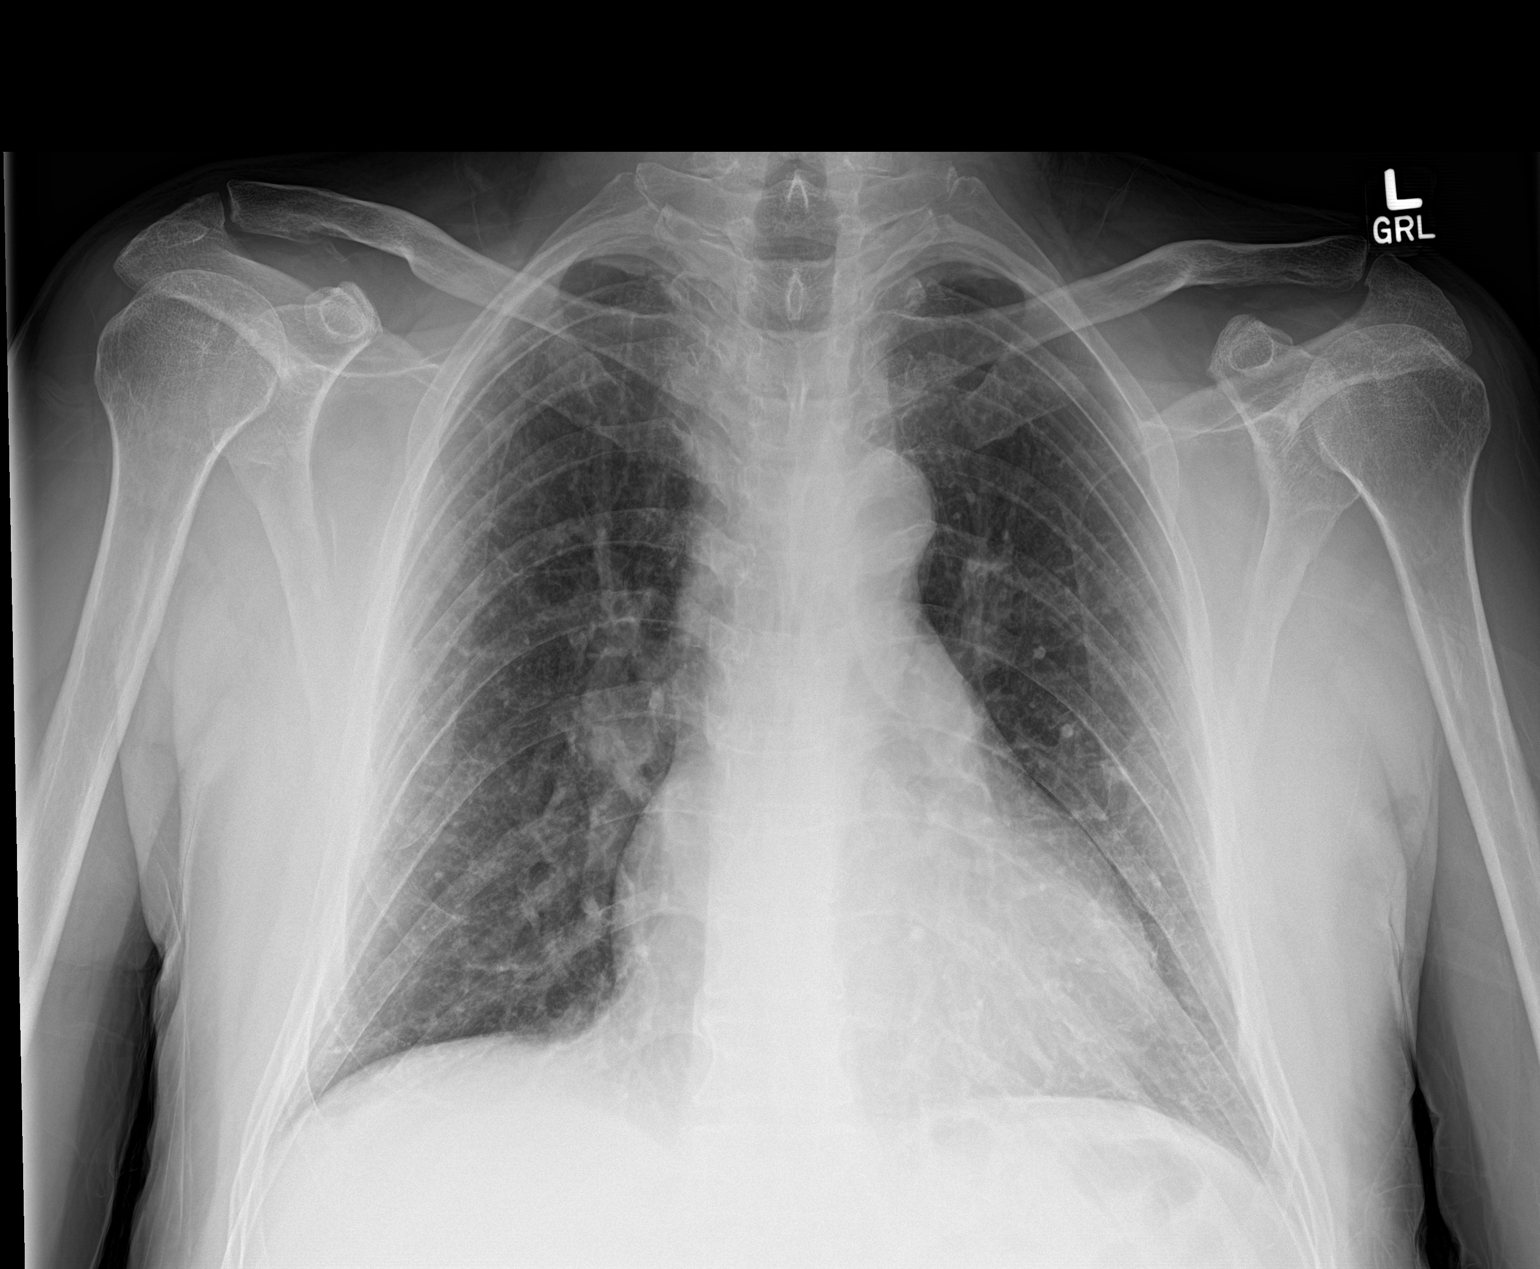

[1 of 1 positions shown; findings below may reference images not displayed]

FINDINGS: Upright AP view at [7I] hours. Mediastinal contours are stable and
within normal limits. Mildly lower lung volumes. No pneumothorax or
pleural effusion. Allowing for portable technique the lungs are
clear. Visualized tracheal air column is within normal limits. No
acute osseous abnormality identified.
IMPRESSION: No acute cardiopulmonary abnormality.

## 2017-12-14 NOTE — ED Triage Notes (Signed)
Patient reports felt dizzy and red in the face around 3 pm.  Patient reports drunk some gatorade and now feels better.  Patient is awake and alert x 4.  No neuro deficits noted.  Patient ambulatory with steady gait.
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
## 2017-12-14 NOTE — Discharge Instructions (Addendum)
Please return if you're worse again. Please follow-up with your regular doctor in the next few days. Call them and let them know you're seen in the ER for this dizziness. They should be able to see fairly rapidly. make sure you're drinking enough fluid is getting warmer.

## 2017-12-15 NOTE — ED Notes (Signed)
Pt's family at door upset about waiting for DC paperwork.

## 2017-12-15 NOTE — ED Notes (Signed)
This RN in to room in order to go over DC instructions with pt. Pt standing in door at this time. Pt reports no needs or concerns at this time. Pt unable to sign signature pad due to "needing to go home". Pt ambulatory with steady gait out of ED.

## 2018-01-08 ENCOUNTER — Ambulatory Visit (INDEPENDENT_AMBULATORY_CARE_PROVIDER_SITE_OTHER): Payer: 59 | Admitting: Internal Medicine

## 2018-01-08 ENCOUNTER — Encounter: Payer: Self-pay | Admitting: Internal Medicine

## 2018-01-08 VITALS — BP 133/84 | HR 74 | Temp 98.8°F | Ht 69.0 in | Wt 155.3 lb

## 2018-01-08 DIAGNOSIS — F419 Anxiety disorder, unspecified: Secondary | ICD-10-CM | POA: Diagnosis not present

## 2018-01-08 DIAGNOSIS — Z720 Tobacco use: Secondary | ICD-10-CM

## 2018-01-08 DIAGNOSIS — Z791 Long term (current) use of non-steroidal anti-inflammatories (NSAID): Secondary | ICD-10-CM | POA: Diagnosis not present

## 2018-01-08 DIAGNOSIS — M549 Dorsalgia, unspecified: Secondary | ICD-10-CM | POA: Insufficient documentation

## 2018-01-08 DIAGNOSIS — M545 Low back pain, unspecified: Secondary | ICD-10-CM

## 2018-01-08 DIAGNOSIS — F17210 Nicotine dependence, cigarettes, uncomplicated: Secondary | ICD-10-CM | POA: Diagnosis not present

## 2018-01-08 DIAGNOSIS — K219 Gastro-esophageal reflux disease without esophagitis: Secondary | ICD-10-CM

## 2018-01-08 DIAGNOSIS — Z0000 Encounter for general adult medical examination without abnormal findings: Secondary | ICD-10-CM

## 2018-01-08 DIAGNOSIS — L989 Disorder of the skin and subcutaneous tissue, unspecified: Secondary | ICD-10-CM

## 2018-01-08 DIAGNOSIS — Z1211 Encounter for screening for malignant neoplasm of colon: Secondary | ICD-10-CM

## 2018-01-08 MED ORDER — ACETAMINOPHEN 500 MG PO TABS
1000.0000 mg | ORAL_TABLET | Freq: Three times a day (TID) | ORAL | 1 refills | Status: DC | PRN
Start: 2018-01-08 — End: 2018-07-07

## 2018-01-08 MED ORDER — ESOMEPRAZOLE MAGNESIUM 40 MG PO CPDR: 40.0000 mg | DELAYED_RELEASE_CAPSULE | Freq: Every day | ORAL | 0 refills | Status: DC

## 2018-01-08 MED FILL — ESOMEPRAZOLE MAG DR 40 MG C: 40 | 90 days supply | Qty: 90 | Fill #0

## 2018-01-08 NOTE — Assessment & Plan Note (Signed)
Assessment Continues to smoke. Has decreased from 3.5ppd to 0.5ppd. He is interested in continuing to cut back, however does not want to quit all together.  Plan - Counseled - Discussed decreasing to 5 cigarettes daily as the next step in smoking cessation, patient agreeable

## 2018-01-08 NOTE — Assessment & Plan Note (Signed)
Assessment Exacerbation of GERD due to increased ibuprofen use and running out of esomeprazole  Plan - Advised patient to avoid NSAIDs - Refilled esomeprazole 40mg  daily
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
## 2018-01-08 NOTE — Patient Instructions (Addendum)
FOLLOW-UP INSTRUCTIONS When: 1 month For: biopsy of skin lesion What to bring: medications   Mr. Menning,  It was a pleasure to meet you today.  For the skin lesions on your face, please return to our clinic when they come back and we will do a biopsy of one to figure out what it is.  For your smoking, we talked about trying to decrease to 5 cigarettes a day.  For your back pain, this is most likely related to arthritis or a muscle strain. Please take tylenol 1000mg  every 8 hours as needed for the pain. You can also try over the counter capsaicin cream or heating pads. Back pain typically gets better with a little bit of time. If the pain gets so bad that you cannot sleep or work or you notice that you are becoming incontinent, please come back to our clinic and we can figure out if there is something more we need to do.  We have placed a referral to the stomach doctors to help you get the colonoscopy done.

## 2018-01-08 NOTE — Assessment & Plan Note (Signed)
Assessment On bilateral cheekbones - typical sun exposed area. Patient has been scraping them off but they reappear in about 1 month. They do not bother him otherwise. He did just scrape the lesions off recently, so I am not sure what they look like typically. Differential includes BCC, SCC, actinic keratosis, solar lentigo, and seborrheic keratosis. Advised patient to return to clinic when the lesion returns in order to obtain biopsy.  Plan - RTC when lesion returns for skin biopsy

## 2018-01-08 NOTE — Progress Notes (Signed)
   CC: management of GERD, back pain  HPI:  [name] is a 62 y.o. male with PMH of GERD, tobacco use, and anxiety who presents for management of GERD.  Back pain: He reports gradual onset of lower back pain that is worse during the day. 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 He notes that his pain is worse with standing and improves if he sits down. The pain occasionally wakes him up from sleep. He denies radiation or numbness of lower extremities. He has been taking 3-4 tablets of ibuprofen daily, which does provide relief, however this caused his reflux to worsen, thus he stopped taking ibuprofen. He has not tried any other pain relievers.  GERD: Patient was previously on nexium 40mg  once daily with control of his reflux, however he ran out of this medication and started using 3-4 tablets of ibuprofen daily for his back pain, which exacerbated his reflux. He reports it is worst when he lies flat.   Colon cancer screening: Denies hemoptysis, hematochezia, or melena. No family history of colon cancer. His daughter works at [employer] and has been trying to get him in for colon cancer screening.  Tobacco use: He continues to smoke about 0.5ppd. He reports he previously smoked 3.5ppd and has cut back to 0.5ppd. He is interested in continuing to decrease the amount he smokes. Discussed decreasing to 5 cigarettes daily as his next goal. Patient agreeable.  Skin lesions on face: Patient has noticed brown circular lesions on bilateral cheekbones. They have been present for at least a year. The lesions do not bother him except for the appearance, so he typically scrapes them off and they return in about a month or so. He did just scrape them off recently.  Please see the assessment and plan below for the status of the patient's chronic medical problems.  Past Medical History:  Diagnosis Date  . Anemia   . Depression    Review of Systems:   GEN: Negative for weight loss  or fevers CV: Negative for CP PULM: Negative for SOB GU: Negative for urinary retention or incontinence MSK: Negative for LE edema. Positive for lower back pain. Negative for radiculopathy, weakness, or numbness of LE  Physical Exam:  Vitals:   01/08/18 1618  BP: 133/84  Pulse: 74  Temp: 98.8 F (37.1 C)  TempSrc: Oral  SpO2: 98%  Weight: 155 lb 4.8 oz (70.4 kg)  Height: 5\' 9"  (1.753 m)   GEN: Sitting in chair in NAD SKIN: 1x1cm light brown circular lesions on bilateral cheekbones, appears to have been scraped off, no blood, swelling, or erythema of the area CV: NR & RR, no m/r/g PULM: CTAB, no wheezes or rales ABD: Soft, NT, ND, +BS LE: No LE edema MSK: +spinal tenderness in lumbar back, as well as bilateral paraspinal muscle tenderness. Negative straight leg raise test. No numbness of lower extremities. 5/5 strength in BLE.  Assessment & Plan:   See Encounters Tab for problem based charting.  Patient discussed with Dr. Evette Doffing

## 2018-01-08 NOTE — Assessment & Plan Note (Signed)
Assessment One month of gradual onset of back pain, worsened with activity and improved with rest. 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 The pain does occasionally wake him up from sleep, however he is still able to complete his daily activities. He has been taking ibuprofen with relief, however this has exacerbated his GERD, so he stopped the ibuprofen. Has not tried anything else. Physical exam only notable for tenderness to palpation. Normal strength and sensation in BLE. No red flags, including personal history of other cancers, no weight loss, no fevers, no urinary incontinence to suggest malignancy vs infection vs compression fracture.  Acute back pain most likely related to arthritis and muscle strain, will treat conservatively. Discussed with patient that his pain will most likely improve with time. Advised to RTC if develops red flag symptoms. If no improvement, can consider MRI.  Plan - Tylenol 1000mg  q8h PRN for pain - OTC capsaicin cream or heating pad - RTC in 1 month

## 2018-01-12 NOTE — Progress Notes (Signed)
Internal Medicine Clinic Attending  Case discussed with Dr. Huang at the time of the visit.  We reviewed the resident's history and exam and pertinent patient test results.  I agree with the assessment, diagnosis, and plan of care documented in the resident's note. 

## 2018-01-13 ENCOUNTER — Emergency Department (HOSPITAL_COMMUNITY): Payer: 59

## 2018-01-13 ENCOUNTER — Inpatient Hospital Stay (HOSPITAL_COMMUNITY)
Admission: EM | Admit: 2018-01-13 | Discharge: 2018-01-21 | DRG: 957 | Disposition: A | Discharge status: Skilled Nursing Facility | Payer: 59 | Attending: General Surgery | Admitting: General Surgery

## 2018-01-13 ENCOUNTER — Other Ambulatory Visit: Payer: Self-pay

## 2018-01-13 ENCOUNTER — Encounter (HOSPITAL_COMMUNITY): Payer: Self-pay | Admitting: *Deleted

## 2018-01-13 ENCOUNTER — Inpatient Hospital Stay (HOSPITAL_COMMUNITY): Payer: 59

## 2018-01-13 DIAGNOSIS — F17210 Nicotine dependence, cigarettes, uncomplicated: Secondary | ICD-10-CM | POA: Diagnosis present

## 2018-01-13 DIAGNOSIS — K219 Gastro-esophageal reflux disease without esophagitis: Secondary | ICD-10-CM

## 2018-01-13 DIAGNOSIS — S37032D Laceration of left kidney, unspecified degree, subsequent encounter: Secondary | ICD-10-CM | POA: Diagnosis not present

## 2018-01-13 DIAGNOSIS — D62 Acute posthemorrhagic anemia: Secondary | ICD-10-CM

## 2018-01-13 DIAGNOSIS — I745 Embolism and thrombosis of iliac artery: Secondary | ICD-10-CM | POA: Diagnosis present

## 2018-01-13 DIAGNOSIS — Z720 Tobacco use: Secondary | ICD-10-CM | POA: Diagnosis not present

## 2018-01-13 DIAGNOSIS — R0682 Tachypnea, not elsewhere classified: Secondary | ICD-10-CM

## 2018-01-13 DIAGNOSIS — S3991XA Unspecified injury of abdomen, initial encounter: Secondary | ICD-10-CM | POA: Diagnosis not present

## 2018-01-13 DIAGNOSIS — M6281 Muscle weakness (generalized): Secondary | ICD-10-CM | POA: Diagnosis not present

## 2018-01-13 DIAGNOSIS — Y92410 Unspecified street and highway as the place of occurrence of the external cause: Secondary | ICD-10-CM | POA: Diagnosis not present

## 2018-01-13 DIAGNOSIS — D72829 Elevated white blood cell count, unspecified: Secondary | ICD-10-CM | POA: Diagnosis not present

## 2018-01-13 DIAGNOSIS — Z419 Encounter for procedure for purposes other than remedying health state, unspecified: Secondary | ICD-10-CM

## 2018-01-13 DIAGNOSIS — S329XXD Fracture of unspecified parts of lumbosacral spine and pelvis, subsequent encounter for fracture with routine healing: Secondary | ICD-10-CM | POA: Diagnosis not present

## 2018-01-13 DIAGNOSIS — S0181XA Laceration without foreign body of other part of head, initial encounter: Secondary | ICD-10-CM | POA: Diagnosis present

## 2018-01-13 DIAGNOSIS — I7772 Dissection of iliac artery: Secondary | ICD-10-CM | POA: Diagnosis present

## 2018-01-13 DIAGNOSIS — R52 Pain, unspecified: Secondary | ICD-10-CM | POA: Diagnosis not present

## 2018-01-13 DIAGNOSIS — S37012A Minor contusion of left kidney, initial encounter: Secondary | ICD-10-CM | POA: Diagnosis present

## 2018-01-13 DIAGNOSIS — S32810A Multiple fractures of pelvis with stable disruption of pelvic ring, initial encounter for closed fracture: Principal | ICD-10-CM | POA: Diagnosis present

## 2018-01-13 DIAGNOSIS — Z743 Need for continuous supervision: Secondary | ICD-10-CM | POA: Diagnosis not present

## 2018-01-13 DIAGNOSIS — S32401D Unspecified fracture of right acetabulum, subsequent encounter for fracture with routine healing: Secondary | ICD-10-CM | POA: Diagnosis not present

## 2018-01-13 DIAGNOSIS — S32435A Nondisplaced fracture of anterior column [iliopubic] of left acetabulum, initial encounter for closed fracture: Secondary | ICD-10-CM | POA: Diagnosis not present

## 2018-01-13 DIAGNOSIS — S37032A Laceration of left kidney, unspecified degree, initial encounter: Secondary | ICD-10-CM | POA: Diagnosis present

## 2018-01-13 DIAGNOSIS — S301XXA Contusion of abdominal wall, initial encounter: Secondary | ICD-10-CM | POA: Diagnosis present

## 2018-01-13 DIAGNOSIS — S0990XA Unspecified injury of head, initial encounter: Secondary | ICD-10-CM | POA: Diagnosis not present

## 2018-01-13 DIAGNOSIS — D649 Anemia, unspecified: Secondary | ICD-10-CM | POA: Diagnosis not present

## 2018-01-13 DIAGNOSIS — S060X9A Concussion with loss of consciousness of unspecified duration, initial encounter: Secondary | ICD-10-CM | POA: Diagnosis present

## 2018-01-13 DIAGNOSIS — S32511A Fracture of superior rim of right pubis, initial encounter for closed fracture: Secondary | ICD-10-CM | POA: Diagnosis not present

## 2018-01-13 DIAGNOSIS — S329XXA Fracture of unspecified parts of lumbosacral spine and pelvis, initial encounter for closed fracture: Secondary | ICD-10-CM | POA: Diagnosis not present

## 2018-01-13 DIAGNOSIS — R279 Unspecified lack of coordination: Secondary | ICD-10-CM | POA: Diagnosis not present

## 2018-01-13 DIAGNOSIS — R2689 Other abnormalities of gait and mobility: Secondary | ICD-10-CM | POA: Diagnosis not present

## 2018-01-13 DIAGNOSIS — S32301D Unspecified fracture of right ilium, subsequent encounter for fracture with routine healing: Secondary | ICD-10-CM | POA: Diagnosis not present

## 2018-01-13 DIAGNOSIS — R55 Syncope and collapse: Secondary | ICD-10-CM | POA: Diagnosis present

## 2018-01-13 DIAGNOSIS — S37002A Unspecified injury of left kidney, initial encounter: Secondary | ICD-10-CM | POA: Diagnosis not present

## 2018-01-13 DIAGNOSIS — S32591A Other specified fracture of right pubis, initial encounter for closed fracture: Secondary | ICD-10-CM

## 2018-01-13 DIAGNOSIS — S064X0A Epidural hemorrhage without loss of consciousness, initial encounter: Secondary | ICD-10-CM | POA: Diagnosis not present

## 2018-01-13 DIAGNOSIS — S32432A Displaced fracture of anterior column [iliopubic] of left acetabulum, initial encounter for closed fracture: Secondary | ICD-10-CM | POA: Diagnosis present

## 2018-01-13 DIAGNOSIS — S299XXA Unspecified injury of thorax, initial encounter: Secondary | ICD-10-CM | POA: Diagnosis not present

## 2018-01-13 DIAGNOSIS — S199XXA Unspecified injury of neck, initial encounter: Secondary | ICD-10-CM | POA: Diagnosis not present

## 2018-01-13 HISTORY — DX: Gastro-esophageal reflux disease without esophagitis: K21.9

## 2018-01-13 HISTORY — DX: Other specified health status: Z78.9

## 2018-01-13 LAB — APTT: aPTT: 24 seconds (ref 24–36)

## 2018-01-13 LAB — COMPREHENSIVE METABOLIC PANEL
ALT: 21 U/L (ref 17–63)
AST: 31 U/L (ref 15–41)
Albumin: 3 g/dL — ABNORMAL LOW (ref 3.5–5.0)
Alkaline Phosphatase: 45 U/L (ref 38–126)
Anion gap: 8 (ref 5–15)
BUN: 18 mg/dL (ref 6–20)
CHLORIDE: 112 mmol/L — AB (ref 101–111)
CO2: 20 mmol/L — AB (ref 22–32)
Calcium: 8 mg/dL — ABNORMAL LOW (ref 8.9–10.3)
Creatinine, Ser: 1.04 mg/dL (ref 0.61–1.24)
GFR calc Af Amer: >60 mL/min (ref >60)
GFR calc non Af Amer: >60 mL/min (ref >60)
Glucose, Bld: 155 mg/dL — ABNORMAL HIGH (ref 65–99)
POTASSIUM: 3.8 mmol/L (ref 3.5–5.1)
SODIUM: 140 mmol/L (ref 135–145)
Total Bilirubin: 1 mg/dL (ref 0.3–1.2)
Total Protein: 5.1 g/dL — ABNORMAL LOW (ref 6.5–8.1)

## 2018-01-13 LAB — PROTIME-INR
INR: 1.13
Prothrombin Time: 14.4 seconds (ref 11.4–15.2)

## 2018-01-13 LAB — CBC WITH DIFFERENTIAL/PLATELET
BASOS ABS: 0 10*3/uL (ref 0.0–0.1)
BASOS PCT: 0 %
EOS ABS: 0 10*3/uL (ref 0.0–0.7)
EOS PCT: 0 %
HCT: 34.6 % — ABNORMAL LOW (ref 39.0–52.0)
Hemoglobin: 11.6 g/dL — ABNORMAL LOW (ref 13.0–17.0)
LYMPHS PCT: 10 %
Lymphs Abs: 1.6 10*3/uL (ref 0.7–4.0)
MCH: 31.7 pg (ref 26.0–34.0)
MCHC: 33.5 g/dL (ref 30.0–36.0)
MCV: 94.5 fL (ref 78.0–100.0)
MONO ABS: 1.2 10*3/uL — AB (ref 0.1–1.0)
Monocytes Relative: 7 %
Neutro Abs: 14.2 10*3/uL — ABNORMAL HIGH (ref 1.7–7.7)
Neutrophils Relative %: 83 %
PLATELETS: 196 10*3/uL (ref 150–400)
RBC: 3.66 MIL/uL — ABNORMAL LOW (ref 4.22–5.81)
RDW: 13.1 % (ref 11.5–15.5)
WBC: 17 10*3/uL — AB (ref 4.0–10.5)

## 2018-01-13 LAB — PREPARE RBC (CROSSMATCH)

## 2018-01-13 LAB — ABO/RH: ABO/RH(D): O POS
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
## 2018-01-13 MED ORDER — PANTOPRAZOLE SODIUM 40 MG PO TBEC
40.0000 mg | DELAYED_RELEASE_TABLET | Freq: Every day | ORAL | Status: DC
  Administered 2018-01-14 – 2018-01-21 (×8): 40 mg via ORAL
  Filled 2018-01-13 (×8): qty 1

## 2018-01-13 MED ORDER — FENTANYL CITRATE (PF) 100 MCG/2ML IJ SOLN
50.0000 ug | Freq: Once | INTRAMUSCULAR | Status: AC
  Administered 2018-01-13: 50 ug via INTRAVENOUS
  Filled 2018-01-13: qty 2

## 2018-01-13 MED ORDER — SODIUM CHLORIDE 0.9 % IV BOLUS
1000.0000 mL | Freq: Once | INTRAVENOUS | Status: AC
  Administered 2018-01-13: 1000 mL via INTRAVENOUS

## 2018-01-13 MED ORDER — PANTOPRAZOLE SODIUM 40 MG PO TBEC: 40.0000 mg | DELAYED_RELEASE_TABLET | Freq: Every day | ORAL | Status: DC

## 2018-01-13 MED ORDER — HYDROMORPHONE HCL 1 MG/ML IJ SOLN
1.0000 mg | INTRAMUSCULAR | Status: DC | PRN
  Administered 2018-01-13 – 2018-01-18 (×4): 1 mg via INTRAVENOUS
  Filled 2018-01-13 (×5): qty 1

## 2018-01-13 MED ORDER — ENOXAPARIN SODIUM 40 MG/0.4ML ~~LOC~~ SOLN
40.0000 mg | SUBCUTANEOUS | Status: DC
  Administered 2018-01-14 – 2018-01-15 (×2): 40 mg via SUBCUTANEOUS
  Filled 2018-01-13 (×2): qty 0.4

## 2018-01-13 MED ORDER — IOPAMIDOL (ISOVUE-370) INJECTION 76%
INTRAVENOUS | Status: AC
  Filled 2018-01-13: qty 100

## 2018-01-13 MED ORDER — FENTANYL CITRATE (PF) 100 MCG/2ML IJ SOLN
INTRAMUSCULAR | Status: AC
  Filled 2018-01-13: qty 2

## 2018-01-13 MED ORDER — HYDROMORPHONE HCL 2 MG/ML IJ SOLN
1.0000 mg | Freq: Once | INTRAMUSCULAR | Status: AC
  Administered 2018-01-13: 1 mg via INTRAVENOUS
  Filled 2018-01-13: qty 1

## 2018-01-13 MED ORDER — ONDANSETRON HCL 4 MG/2ML IJ SOLN
4.0000 mg | Freq: Four times a day (QID) | INTRAMUSCULAR | Status: DC | PRN
  Administered 2018-01-13: 4 mg via INTRAVENOUS
  Filled 2018-01-13: qty 2

## 2018-01-13 MED ORDER — ACETAMINOPHEN 325 MG PO TABS
650.0000 mg | ORAL_TABLET | ORAL | Status: DC | PRN
Start: 2018-01-13 — End: 2018-01-14

## 2018-01-13 MED ORDER — IOPAMIDOL (ISOVUE-300) INJECTION 61%
INTRAVENOUS | Status: AC
  Filled 2018-01-13: qty 100

## 2018-01-13 MED ORDER — FENTANYL CITRATE (PF) 100 MCG/2ML IJ SOLN
50.0000 ug | Freq: Once | INTRAMUSCULAR | Status: AC
Start: 2018-01-13 — End: 2018-01-13
  Administered 2018-01-13: 50 ug via INTRAVENOUS
  Filled 2018-01-13: qty 2

## 2018-01-13 MED ORDER — DOCUSATE SODIUM 100 MG PO CAPS
100.0000 mg | ORAL_CAPSULE | Freq: Two times a day (BID) | ORAL | Status: DC
  Administered 2018-01-13 – 2018-01-21 (×16): 100 mg via ORAL
  Filled 2018-01-13 (×16): qty 1

## 2018-01-13 MED ORDER — IOPAMIDOL (ISOVUE-370) INJECTION 76%
100.0000 mL | Freq: Once | INTRAVENOUS | Status: AC | PRN
  Administered 2018-01-13: 100 mL via INTRAVENOUS

## 2018-01-13 MED ORDER — ONDANSETRON 4 MG PO TBDP: 4.0000 mg | ORAL_TABLET | Freq: Four times a day (QID) | ORAL | Status: DC | PRN

## 2018-01-13 MED ORDER — IOPAMIDOL (ISOVUE-300) INJECTION 61%
100.0000 mL | Freq: Once | INTRAVENOUS | Status: AC | PRN
  Administered 2018-01-13: 100 mL via INTRAVENOUS

## 2018-01-13 MED ORDER — OXYCODONE HCL 5 MG PO TABS
5.0000 mg | ORAL_TABLET | ORAL | Status: DC | PRN
  Administered 2018-01-14: 5 mg via ORAL
  Filled 2018-01-13: qty 1

## 2018-01-13 MED ORDER — SODIUM CHLORIDE 0.9 % IV SOLN
10.0000 mL/h | Freq: Once | INTRAVENOUS | Status: AC
  Administered 2018-01-13: 10 mL/h via INTRAVENOUS

## 2018-01-13 MED ORDER — POTASSIUM CHLORIDE IN NACL 20-0.9 MEQ/L-% IV SOLN
INTRAVENOUS | Status: DC
  Administered 2018-01-13 – 2018-01-16 (×5): via INTRAVENOUS
  Filled 2018-01-13 (×7): qty 1000

## 2018-01-13 MED ORDER — MORPHINE SULFATE (PF) 4 MG/ML IV SOLN
1.0000 mg | INTRAVENOUS | Status: DC | PRN
  Administered 2018-01-13 – 2018-01-14 (×2): 2 mg via INTRAVENOUS
  Filled 2018-01-13 (×2): qty 1

## 2018-01-13 MED ORDER — ONDANSETRON HCL 4 MG/2ML IJ SOLN
4.0000 mg | Freq: Once | INTRAMUSCULAR | Status: AC
  Administered 2018-01-13: 4 mg via INTRAVENOUS
  Filled 2018-01-13: qty 2
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
## 2018-01-13 NOTE — ED Notes (Signed)
Cleaned pt's wound on forehead and dressed site

## 2018-01-13 NOTE — ED Notes (Signed)
Pt transported to CT ?
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
## 2018-01-13 NOTE — Progress Notes (Signed)
I was asked by Dr. Lorin Mercy to review patient's x-rays and CT scan. Patient has unstable pelvic ring injury with significant displacement on plain radiographs. There is complete zone 2 sacral fracture with significant comminution. I believe that surgical fixation is warranted. I will tentatively place patient on surgery schedule tomorrow AM for fixation of his pelvis. Please make NPO after midnight. Formal consult from orthopaedic trauma team to follow in the AM.  Shona Needles, MD [facility] [phone] (phone)
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
## 2018-01-13 NOTE — Progress Notes (Signed)
Urology will need repeat ct a/p to get better imaging of ureters  Vascular will likely need CTA abd/pelvis (dr Bridgett Larsson to review initial imaging first)- if so - the CTA of abd pelvis will be sufficient for urology Discussed with Dr Bridgett Larsson and Dr Regino Schultze. Redmond Pulling, MD, FACS General, Bariatric, & Minimally Invasive Surgery [facility], [place]

## 2018-01-13 NOTE — ED Notes (Signed)
Placed Aspen collar per verbal order from Dr. Redmond Pulling. Placed condom cath on pt as well

## 2018-01-14 ENCOUNTER — Inpatient Hospital Stay (HOSPITAL_COMMUNITY): Payer: 59

## 2018-01-14 ENCOUNTER — Encounter (HOSPITAL_COMMUNITY): Payer: Self-pay

## 2018-01-14 ENCOUNTER — Encounter (HOSPITAL_COMMUNITY): Admission: EM | Disposition: A | Discharge status: Skilled Nursing Facility | Payer: Self-pay | Source: Home / Self Care

## 2018-01-14 ENCOUNTER — Inpatient Hospital Stay (HOSPITAL_COMMUNITY): Payer: 59 | Admitting: Certified Registered Nurse Anesthetist

## 2018-01-14 ENCOUNTER — Other Ambulatory Visit: Payer: Self-pay

## 2018-01-14 HISTORY — PX: FACIAL LACERATION REPAIR: SHX6589

## 2018-01-14 HISTORY — PX: ORIF PELVIC FRACTURE WITH PERCUTANEOUS SCREWS: SHX6800

## 2018-01-14 LAB — CBC
HEMATOCRIT: 36.8 % — AB (ref 39.0–52.0)
HEMOGLOBIN: 12.3 g/dL — AB (ref 13.0–17.0)
MCH: 30.4 pg (ref 26.0–34.0)
MCHC: 33.4 g/dL (ref 30.0–36.0)
MCV: 90.9 fL (ref 78.0–100.0)
Platelets: 163 10*3/uL (ref 150–400)
RBC: 4.05 MIL/uL — AB (ref 4.22–5.81)
RDW: 17 % — ABNORMAL HIGH (ref 11.5–15.5)
WBC: 14.7 10*3/uL — ABNORMAL HIGH (ref 4.0–10.5)

## 2018-01-14 LAB — CBC WITH DIFFERENTIAL/PLATELET
BASOS ABS: 0 10*3/uL (ref 0.0–0.1)
Basophils Relative: 0 %
EOS ABS: 0 10*3/uL (ref 0.0–0.7)
EOS PCT: 0 %
HCT: 31 % — ABNORMAL LOW (ref 39.0–52.0)
HEMOGLOBIN: 10.4 g/dL — AB (ref 13.0–17.0)
LYMPHS PCT: 5 %
Lymphs Abs: 0.6 10*3/uL — ABNORMAL LOW (ref 0.7–4.0)
MCH: 30.5 pg (ref 26.0–34.0)
MCHC: 33.5 g/dL (ref 30.0–36.0)
MCV: 90.9 fL (ref 78.0–100.0)
Monocytes Absolute: 0.9 10*3/uL (ref 0.1–1.0)
Monocytes Relative: 7 %
NEUTROS PCT: 88 %
Neutro Abs: 11 10*3/uL — ABNORMAL HIGH (ref 1.7–7.7)
PLATELETS: 120 10*3/uL — AB (ref 150–400)
RBC: 3.41 MIL/uL — AB (ref 4.22–5.81)
RDW: 17.1 % — ABNORMAL HIGH (ref 11.5–15.5)
WBC: 12.5 10*3/uL — AB (ref 4.0–10.5)

## 2018-01-14 LAB — COMPREHENSIVE METABOLIC PANEL
ALK PHOS: 36 U/L — AB (ref 38–126)
ALT: 23 U/L (ref 17–63)
AST: 38 U/L (ref 15–41)
Albumin: 3.1 g/dL — ABNORMAL LOW (ref 3.5–5.0)
Anion gap: 7 (ref 5–15)
BILIRUBIN TOTAL: 1.7 mg/dL — AB (ref 0.3–1.2)
BUN: 20 mg/dL (ref 6–20)
CALCIUM: 7.8 mg/dL — AB (ref 8.9–10.3)
CO2: 24 mmol/L (ref 22–32)
CREATININE: 1.06 mg/dL (ref 0.61–1.24)
Chloride: 115 mmol/L — ABNORMAL HIGH (ref 101–111)
Glucose, Bld: 160 mg/dL — ABNORMAL HIGH (ref 65–99)
Potassium: 5.2 mmol/L — ABNORMAL HIGH (ref 3.5–5.1)
Sodium: 146 mmol/L — ABNORMAL HIGH (ref 135–145)
Total Protein: 5.5 g/dL — ABNORMAL LOW (ref 6.5–8.1)

## 2018-01-14 LAB — BASIC METABOLIC PANEL
ANION GAP: 5 (ref 5–15)
BUN: 22 mg/dL — ABNORMAL HIGH (ref 6–20)
CO2: 24 mmol/L (ref 22–32)
Calcium: 7.6 mg/dL — ABNORMAL LOW (ref 8.9–10.3)
Chloride: 108 mmol/L (ref 101–111)
Creatinine, Ser: 1.15 mg/dL (ref 0.61–1.24)
GFR calc Af Amer: >60 mL/min (ref >60)
Glucose, Bld: 157 mg/dL — ABNORMAL HIGH (ref 65–99)
POTASSIUM: 4.6 mmol/L (ref 3.5–5.1)
SODIUM: 137 mmol/L (ref 135–145)

## 2018-01-14 LAB — MRSA PCR SCREENING: MRSA BY PCR: NEGATIVE

## 2018-01-14 LAB — HIV ANTIBODY (ROUTINE TESTING W REFLEX): HIV Screen 4th Generation wRfx: NONREACTIVE

## 2018-01-14 IMAGING — CR DG PELVIS 3+V JUDET
5 series · 5 of 5 positions shown · non-contrast
Comparison: CTs from the previous day as well as intraoperative
films from earlier today

CLINICAL DATA: Status post ORIF of multiple pelvic fractures

EXAM:
JUDET PELVIS - 3+ VIEW

[AP (1 of 3)]
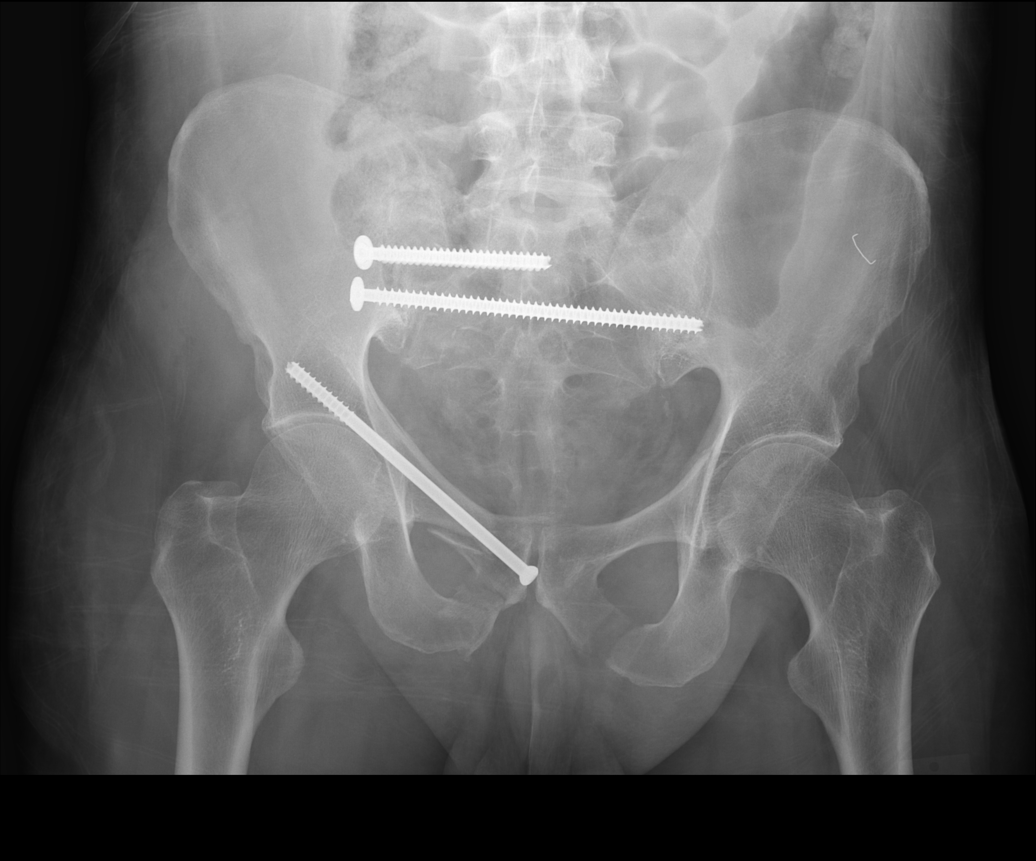

[[person_name] view (1 of 2)]
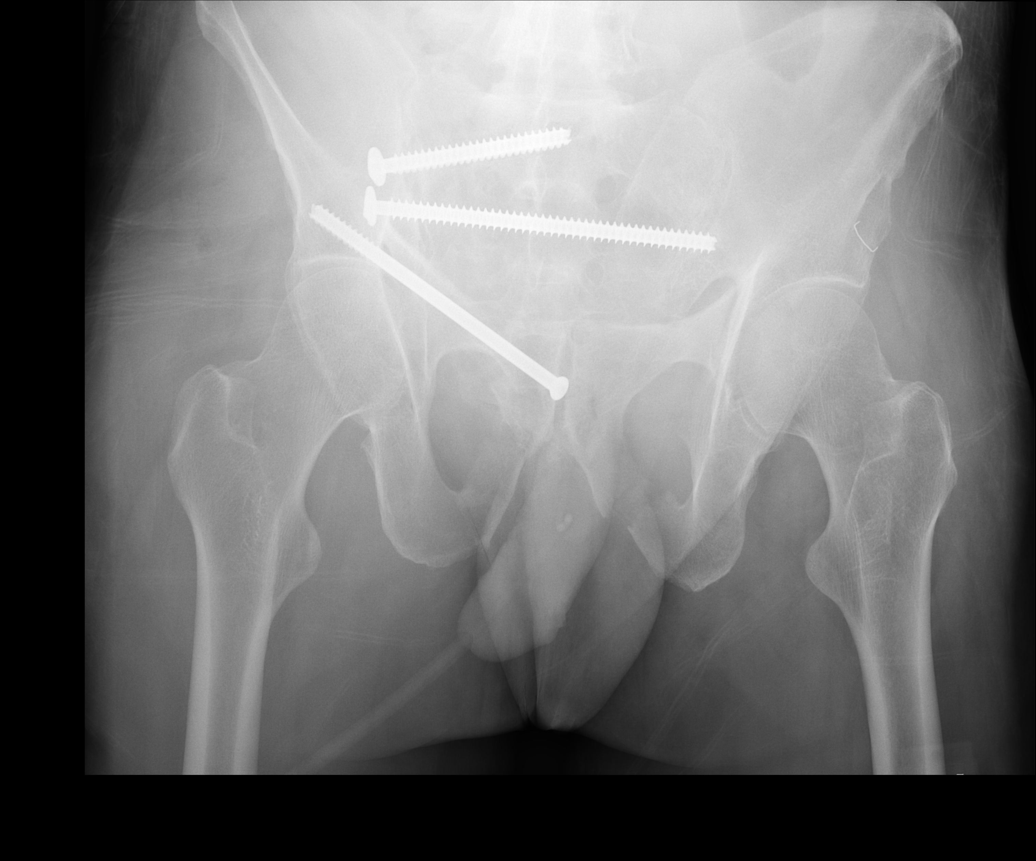

[[person_name] view (2 of 2)]
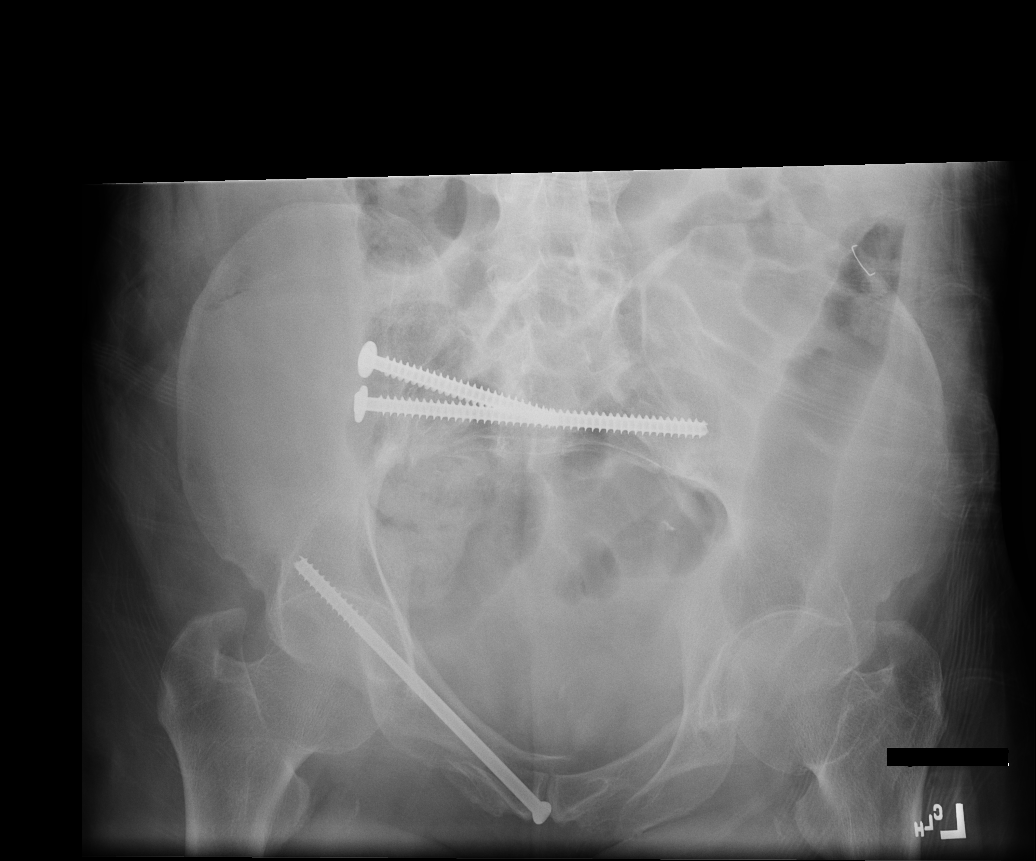

[AP (2 of 3)]
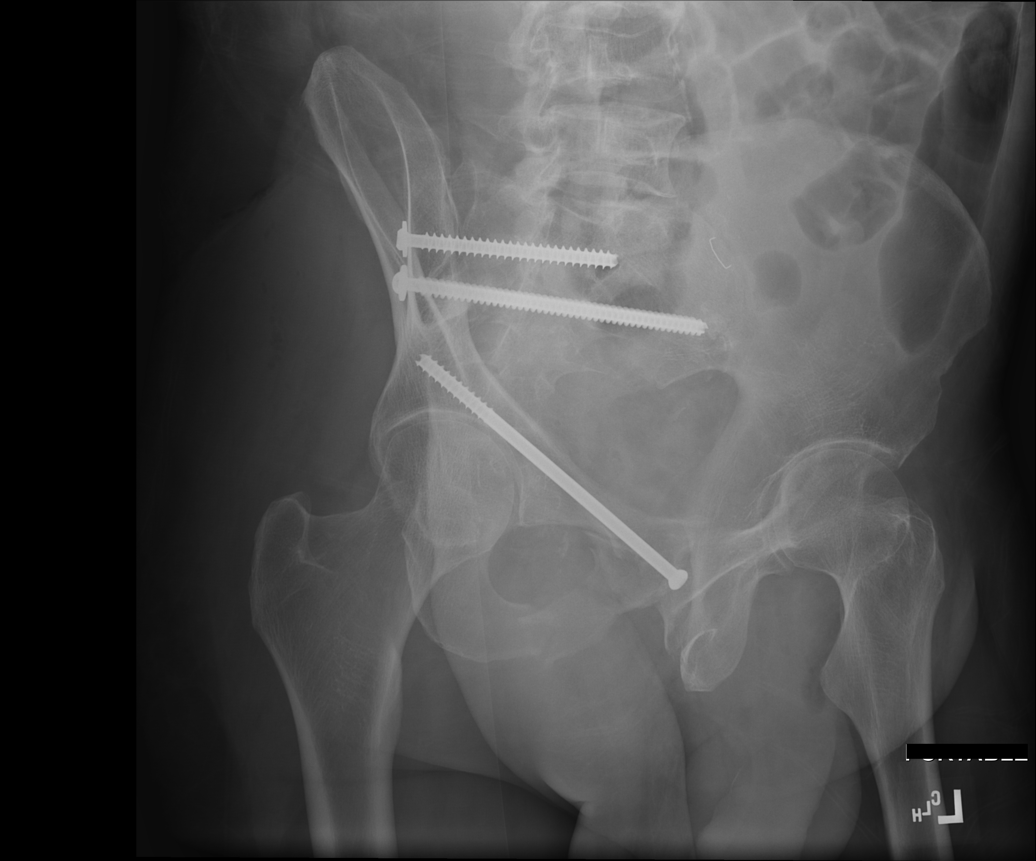

[AP (3 of 3)]
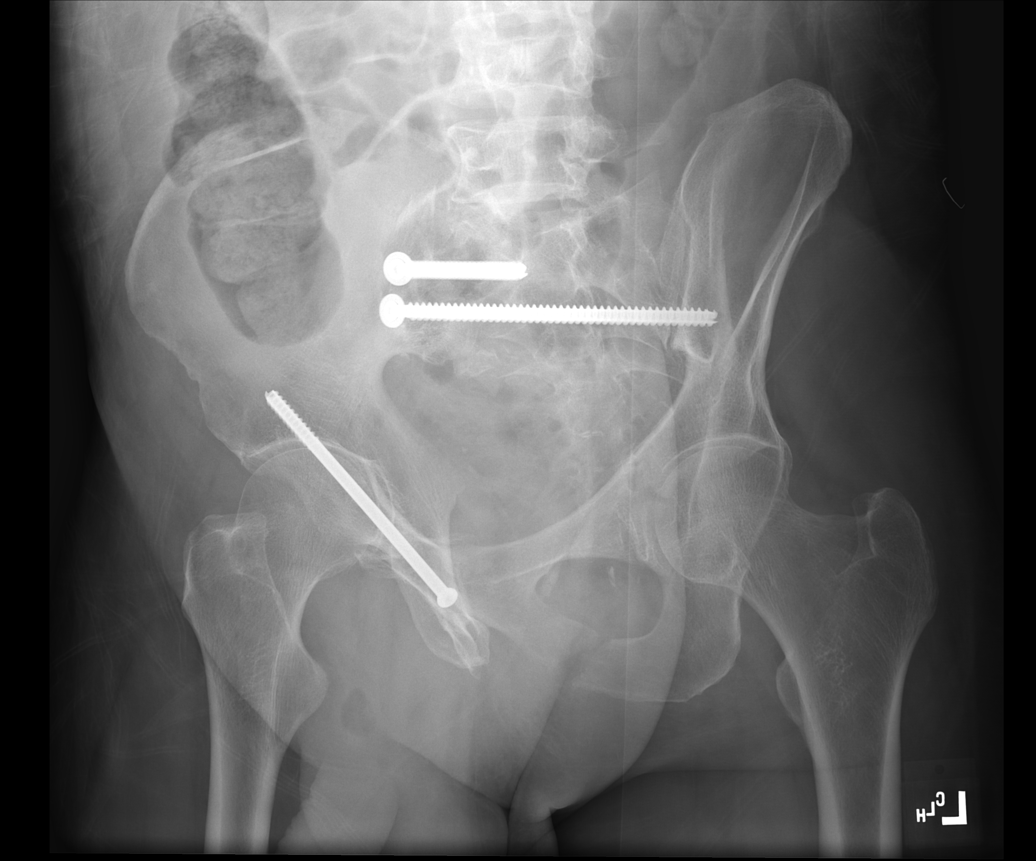

[5 of 5 positions shown; findings below may reference images not displayed]

FINDINGS: There are 2 fixation screws traversing the right sacrum and a third
screw traversing the right superior pubic ramus. The fracture
fragments are in near anatomic alignment. The left inferior pubic
ramus fracture is again noted and only mildly distracted. The known
left acetabular fracture shows no significant distraction at the
fracture site.
IMPRESSION: Near anatomic alignment of right sacral and pubic rami fractures as
described.

## 2018-01-14 SURGERY — CLOSED REDUCTION, PELVIS, WITH PERCUTANEOUS FIXATION
Anesthesia: General | Site: Face | Laterality: Right

## 2018-01-14 MED ORDER — CHLORHEXIDINE GLUCONATE 4 % EX LIQD: 60.0000 mL | Freq: Once | CUTANEOUS | Status: DC

## 2018-01-14 MED ORDER — DEXAMETHASONE SODIUM PHOSPHATE 4 MG/ML IJ SOLN
INTRAMUSCULAR | Status: DC | PRN
  Administered 2018-01-14: 10 mg via INTRAVENOUS

## 2018-01-14 MED ORDER — OXYCODONE HCL 5 MG PO TABS
5.0000 mg | ORAL_TABLET | ORAL | Status: DC | PRN
  Administered 2018-01-14: 5 mg via ORAL
  Administered 2018-01-15 – 2018-01-17 (×8): 10 mg via ORAL
  Administered 2018-01-17: 5 mg via ORAL
  Administered 2018-01-17 – 2018-01-18 (×4): 10 mg via ORAL
  Administered 2018-01-18: 5 mg via ORAL
  Administered 2018-01-19 – 2018-01-21 (×9): 10 mg via ORAL
  Filled 2018-01-14 (×8): qty 2
  Filled 2018-01-14: qty 1
  Filled 2018-01-14 (×7): qty 2
  Filled 2018-01-14: qty 1
  Filled 2018-01-14 (×3): qty 2
  Filled 2018-01-14: qty 1
  Filled 2018-01-14 (×3): qty 2

## 2018-01-14 MED ORDER — OXYCODONE HCL 5 MG/5ML PO SOLN: 5.0000 mg | Freq: Once | ORAL | Status: DC | PRN

## 2018-01-14 MED ORDER — FENTANYL CITRATE (PF) 100 MCG/2ML IJ SOLN
25.0000 ug | INTRAMUSCULAR | Status: DC | PRN
Start: 2018-01-14 — End: 2018-01-14
  Administered 2018-01-14 (×2): 50 ug via INTRAVENOUS

## 2018-01-14 MED ORDER — CEFAZOLIN SODIUM-DEXTROSE 2-4 GM/100ML-% IV SOLN
INTRAVENOUS | Status: AC
  Filled 2018-01-14: qty 100

## 2018-01-14 MED ORDER — LIDOCAINE 2% (20 MG/ML) 5 ML SYRINGE
INTRAMUSCULAR | Status: AC
  Filled 2018-01-14: qty 5

## 2018-01-14 MED ORDER — FENTANYL CITRATE (PF) 250 MCG/5ML IJ SOLN
INTRAMUSCULAR | Status: AC
  Filled 2018-01-14: qty 5

## 2018-01-14 MED ORDER — DEXAMETHASONE SODIUM PHOSPHATE 10 MG/ML IJ SOLN
INTRAMUSCULAR | Status: AC
Start: 2018-01-14 — End: ?
  Filled 2018-01-14: qty 1

## 2018-01-14 MED ORDER — METHOCARBAMOL 1000 MG/10ML IJ SOLN
500.0000 mg | Freq: Four times a day (QID) | INTRAVENOUS | Status: DC | PRN
  Filled 2018-01-14: qty 5

## 2018-01-14 MED ORDER — TRAMADOL HCL 50 MG PO TABS
50.0000 mg | ORAL_TABLET | Freq: Four times a day (QID) | ORAL | Status: DC
  Administered 2018-01-14: 50 mg via ORAL
  Filled 2018-01-14: qty 1

## 2018-01-14 MED ORDER — OXYCODONE HCL 5 MG PO TABS: 5.0000 mg | ORAL_TABLET | Freq: Once | ORAL | Status: DC | PRN

## 2018-01-14 MED ORDER — ROCURONIUM BROMIDE 100 MG/10ML IV SOLN
INTRAVENOUS | Status: DC | PRN
  Administered 2018-01-14: 50 mg via INTRAVENOUS

## 2018-01-14 MED ORDER — FENTANYL CITRATE (PF) 100 MCG/2ML IJ SOLN
INTRAMUSCULAR | Status: AC
  Administered 2018-01-14: 50 ug via INTRAVENOUS
  Filled 2018-01-14: qty 2

## 2018-01-14 MED ORDER — PHENYLEPHRINE HCL 10 MG/ML IJ SOLN
INTRAMUSCULAR | Status: DC | PRN
  Administered 2018-01-14 (×5): 80 ug via INTRAVENOUS

## 2018-01-14 MED ORDER — ACETAMINOPHEN 500 MG PO TABS
1000.0000 mg | ORAL_TABLET | Freq: Three times a day (TID) | ORAL | Status: DC
  Administered 2018-01-14 – 2018-01-21 (×20): 1000 mg via ORAL
  Filled 2018-01-14 (×22): qty 2

## 2018-01-14 MED ORDER — PHENYLEPHRINE 40 MCG/ML (10ML) SYRINGE FOR IV PUSH (FOR BLOOD PRESSURE SUPPORT)
PREFILLED_SYRINGE | INTRAVENOUS | Status: AC
  Filled 2018-01-14: qty 10

## 2018-01-14 MED ORDER — TRAMADOL HCL 50 MG PO TABS
50.0000 mg | ORAL_TABLET | Freq: Four times a day (QID) | ORAL | Status: DC
  Administered 2018-01-14 – 2018-01-21 (×26): 50 mg via ORAL
  Filled 2018-01-14 (×26): qty 1

## 2018-01-14 MED ORDER — POVIDONE-IODINE 10 % EX SWAB: 2.0000 "application " | Freq: Once | CUTANEOUS | Status: DC

## 2018-01-14 MED ORDER — ACETAMINOPHEN 325 MG PO TABS: 325.0000 mg | ORAL_TABLET | ORAL | Status: DC | PRN

## 2018-01-14 MED ORDER — ACETAMINOPHEN 160 MG/5ML PO SOLN: 325.0000 mg | ORAL | Status: DC | PRN

## 2018-01-14 MED ORDER — PHENYLEPHRINE HCL 10 MG/ML IJ SOLN
INTRAMUSCULAR | Status: DC | PRN
  Administered 2018-01-14: 40 ug/min via INTRAVENOUS

## 2018-01-14 MED ORDER — 0.9 % SODIUM CHLORIDE (POUR BTL) OPTIME
TOPICAL | Status: DC | PRN
  Administered 2018-01-14: 1000 mL

## 2018-01-14 MED ORDER — PROPOFOL 10 MG/ML IV BOLUS
INTRAVENOUS | Status: DC | PRN
  Administered 2018-01-14: 100 mg via INTRAVENOUS

## 2018-01-14 MED ORDER — PROPOFOL 10 MG/ML IV BOLUS
INTRAVENOUS | Status: AC
  Filled 2018-01-14: qty 20

## 2018-01-14 MED ORDER — SUGAMMADEX SODIUM 200 MG/2ML IV SOLN
INTRAVENOUS | Status: DC | PRN
  Administered 2018-01-14: 200 mg via INTRAVENOUS

## 2018-01-14 MED ORDER — MIDAZOLAM HCL 2 MG/2ML IJ SOLN
INTRAMUSCULAR | Status: AC
  Filled 2018-01-14: qty 2

## 2018-01-14 MED ORDER — CEFAZOLIN SODIUM-DEXTROSE 2-4 GM/100ML-% IV SOLN
2.0000 g | Freq: Three times a day (TID) | INTRAVENOUS | Status: AC
  Administered 2018-01-14 – 2018-01-15 (×3): 2 g via INTRAVENOUS
  Filled 2018-01-14 (×3): qty 100

## 2018-01-14 MED ORDER — ONDANSETRON HCL 4 MG/2ML IJ SOLN
INTRAMUSCULAR | Status: AC
  Filled 2018-01-14: qty 2

## 2018-01-14 MED ORDER — LACTATED RINGERS IV SOLN
INTRAVENOUS | Status: DC
  Administered 2018-01-14 (×3): via INTRAVENOUS

## 2018-01-14 MED ORDER — ROCURONIUM BROMIDE 10 MG/ML (PF) SYRINGE
PREFILLED_SYRINGE | INTRAVENOUS | Status: AC
Start: 2018-01-14 — End: ?
  Filled 2018-01-14: qty 5

## 2018-01-14 MED ORDER — FENTANYL CITRATE (PF) 100 MCG/2ML IJ SOLN
INTRAMUSCULAR | Status: DC | PRN
  Administered 2018-01-14 (×3): 50 ug via INTRAVENOUS
  Administered 2018-01-14: 100 ug via INTRAVENOUS
  Administered 2018-01-14: 50 ug via INTRAVENOUS
  Administered 2018-01-14: 100 ug via INTRAVENOUS

## 2018-01-14 MED ORDER — ONDANSETRON HCL 4 MG/2ML IJ SOLN: 4.0000 mg | Freq: Once | INTRAMUSCULAR | Status: DC | PRN

## 2018-01-14 MED ORDER — ONDANSETRON HCL 4 MG/2ML IJ SOLN
INTRAMUSCULAR | Status: DC | PRN
  Administered 2018-01-14: 4 mg via INTRAVENOUS

## 2018-01-14 MED ORDER — CEFAZOLIN SODIUM-DEXTROSE 2-4 GM/100ML-% IV SOLN
2.0000 g | INTRAVENOUS | Status: AC
  Administered 2018-01-14: 2 g via INTRAVENOUS

## 2018-01-14 MED ORDER — MEPERIDINE HCL 50 MG/ML IJ SOLN: 6.2500 mg | INTRAMUSCULAR | Status: DC | PRN

## 2018-01-14 SURGICAL SUPPLY — 47 items
ADH SKN CLS APL DERMABOND .7 (GAUZE/BANDAGES/DRESSINGS)
BIT DRILL CANN 4.5MM (BIT) ×4 IMPLANT
BLADE CLIPPER SURG (BLADE) IMPLANT
BLADE SURG 11 STRL SS (BLADE) ×4 IMPLANT
CHLORAPREP W/TINT 26ML (MISCELLANEOUS) ×4 IMPLANT
DERMABOND ADVANCED (GAUZE/BANDAGES/DRESSINGS)
DERMABOND ADVANCED .7 DNX12 (GAUZE/BANDAGES/DRESSINGS) IMPLANT
DRAPE C-ARM 42X72 X-RAY (DRAPES) ×4 IMPLANT
DRAPE C-ARMOR (DRAPES) ×4 IMPLANT
DRAPE INCISE IOBAN 66X45 STRL (DRAPES) ×4 IMPLANT
DRAPE PROXIMA HALF (DRAPES) ×4 IMPLANT
DRAPE SURG 17X23 STRL (DRAPES) ×24 IMPLANT
DRAPE U-SHAPE 47X51 STRL (DRAPES) ×4 IMPLANT
DRAPE UNIVERSAL PACK (DRAPES) ×4 IMPLANT
DRILL BIT CANN 4.5MM (BIT) ×4
DRSG MEPILEX BORDER 4X4 (GAUZE/BANDAGES/DRESSINGS) ×12 IMPLANT
DRSG MEPILEX BORDER 4X8 (GAUZE/BANDAGES/DRESSINGS) ×4 IMPLANT
DRSG TEGADERM 2-3/8X2-3/4 SM (GAUZE/BANDAGES/DRESSINGS) ×4 IMPLANT
ELECT REM PT RETURN 9FT ADLT (ELECTROSURGICAL) ×4
ELECTRODE REM PT RTRN 9FT ADLT (ELECTROSURGICAL) ×2 IMPLANT
GAUZE SPONGE 2X2 8PLY STRL LF (GAUZE/BANDAGES/DRESSINGS) ×2 IMPLANT
GLOVE BIO SURGEON STRL SZ7.5 (GLOVE) ×16 IMPLANT
GLOVE BIOGEL PI IND STRL 7.5 (GLOVE) ×2 IMPLANT
GLOVE BIOGEL PI INDICATOR 7.5 (GLOVE) ×2
GOWN STRL REUS W/ TWL LRG LVL3 (GOWN DISPOSABLE) ×4 IMPLANT
GOWN STRL REUS W/TWL LRG LVL3 (GOWN DISPOSABLE) ×8
GUIDEWIRE 2.0MM (WIRE) ×12 IMPLANT
GUIDEWIRE THREADED 2.8MM (WIRE) ×12 IMPLANT
KIT BASIN OR (CUSTOM PROCEDURE TRAY) ×4 IMPLANT
KIT TURNOVER KIT B (KITS) ×4 IMPLANT
MANIFOLD NEPTUNE II (INSTRUMENTS) ×4 IMPLANT
NS IRRIG 1000ML POUR BTL (IV SOLUTION) ×4 IMPLANT
PACK TOTAL JOINT (CUSTOM PROCEDURE TRAY) ×4 IMPLANT
PAD ARMBOARD 7.5X6 YLW CONV (MISCELLANEOUS) ×8 IMPLANT
SCREW CANN 6.5X120X32 (Screw) ×4 IMPLANT
SCREW CANN FT 7.3X80 (Screw) ×4 IMPLANT
SCREW LOCK CANN FT 7.3X135 (Screw) ×4 IMPLANT
SPONGE GAUZE 2X2 STER 10/PKG (GAUZE/BANDAGES/DRESSINGS) ×2
SPONGE LAP 18X18 X RAY DECT (DISPOSABLE) IMPLANT
STAPLER VISISTAT 35W (STAPLE) ×4 IMPLANT
SUCTION FRAZIER HANDLE 10FR (MISCELLANEOUS) ×2
SUCTION TUBE FRAZIER 10FR DISP (MISCELLANEOUS) ×2 IMPLANT
SUT MNCRL AB 3-0 PS2 18 (SUTURE) ×4 IMPLANT
SUT MON AB 2-0 CT1 36 (SUTURE) ×4 IMPLANT
TRAY FOLEY W/METER SILVER 16FR (SET/KITS/TRAYS/PACK) IMPLANT
WASHER FOR 5.0 SCREWS (Washer) ×8 IMPLANT
WATER STERILE IRR 1000ML POUR (IV SOLUTION) ×4 IMPLANT

## 2018-01-14 NOTE — Progress Notes (Signed)
Receiving Note: Patient returned to unit from PACU. Patient placed on the monitior. VSS. RN assessment complete. IV's assessed for patency. Right PIV flushes w/out blood return, Left FA IV flushed with + blood return. Orders acknowledged and released.

## 2018-01-14 NOTE — Anesthesia Preprocedure Evaluation (Addendum)
Anesthesia Evaluation  Patient identified by MRN, date of birth, ID band Patient awake    Reviewed: Allergy & Precautions, H&P , NPO status , Patient's Chart, lab work & pertinent test results, reviewed documented beta blocker date and time   Airway Mallampati: II  TM Distance: >3 FB Neck ROM: full    Dental no notable dental hx.    Pulmonary neg pulmonary ROS, Current Smoker,    Pulmonary exam normal breath sounds clear to auscultation       Cardiovascular Exercise Tolerance: Good negative cardio ROS   Rhythm:regular Rate:Normal     Neuro/Psych negative neurological ROS  negative psych ROS   GI/Hepatic Neg liver ROS, GERD  ,  Endo/Other  negative endocrine ROS  Renal/GU negative Renal ROS  negative genitourinary   Musculoskeletal   Abdominal   Peds  Hematology negative hematology ROS (+)   Anesthesia Other Findings   Reproductive/Obstetrics negative OB ROS                            Anesthesia Physical Anesthesia Plan  ASA: II  Anesthesia Plan: General   Post-op Pain Management:    Induction: Intravenous  PONV Risk Score and Plan: 2 and Ondansetron, Dexamethasone and Treatment may vary due to age or medical condition  Airway Management Planned: LMA and Oral ETT  Additional Equipment:   Intra-op Plan:   Post-operative Plan:   Informed Consent: I have reviewed the patients History and Physical, chart, labs and discussed the procedure including the risks, benefits and alternatives for the proposed anesthesia with the patient or authorized representative who has indicated his/her understanding and acceptance.   Dental Advisory Given  Plan Discussed with: CRNA, Anesthesiologist and Surgeon  Anesthesia Plan Comments: ( )        Anesthesia Quick Evaluation
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
## 2018-01-14 NOTE — Progress Notes (Signed)
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 vasculitis or significant stenosis. Veins: Poor opacification. Review of the MIP images confirms the above findings. NON-VASCULAR Lower chest: No acute abnormality. Hepatobiliary: No hepatic injury or perihepatic hematoma. Gallbladder is unremarkable Pancreas: Unremarkable. No pancreatic ductal dilatation or surrounding inflammatory changes. Spleen: No splenic injury or perisplenic hematoma. Adrenals/Urinary Tract: Left kidney rotation anomaly. Stable left kidney lacerations greater than 1 cm. Good opacification of the renal collecting system without evidence for collecting system rupture. No significant subcapsular hematoma. No adrenal hematoma or right kidney injury identified. No bladder rupture. Stomach/Bowel: Stomach is within normal limits. Appendix appears normal. No evidence of bowel wall thickening, distention, or inflammatory changes. Lymphatic: Aortic atherosclerosis. No enlarged abdominal or pelvic lymph nodes. Reproductive: Prostate is unremarkable. Other: Bilateral inguinal hernias containing fat. Musculoskeletal: Comminuted fractures of bilateral inferior pubic rami and the right superior pubic ramus. Left anterior column of acetabulum fracture. Comminuted fracture of the right sacral ala S1-3 segments. Mildly displaced  fracture of the right L5 transverse process. No acute fracture of visible thoracic or lumbar vertebral bodies and posterior elements. Intermediate attenuation stranding within the retroperitoneum involving bilateral renal spaces and extending along the iliopsoas muscles compatible with retroperitoneal hematoma is stable. Stable right psoas hematoma. Stable presacral hematoma. No active extravasation identified at this time. IMPRESSION: VASCULAR 1. Stable filling defect within right common iliac artery, likely focal dissection in setting of blunt trauma. The downstream vessel enhances normally. 2. 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 No evidence for vascular injury. NON-VASCULAR 1. Left kidney transcortical laceration. Dense contrast in the left renal collecting system, no evidence for rupture of the collecting system or contrast extravasation at this time. No interval change in laceration. No new subcapsular hematoma or infarct. 2. Stable right-greater-than-left retroperitoneal hematoma, right psoas hematoma, presacral hematoma. No active extravasation. 3. Stable comminuted fractures of bilateral inferior pubic rami, right superior pubic ramus, left anterior column of acetabulum, right sacral ala, and right L5 transverse process. These results were called by telephone at the time of interpretation on 01/13/2018 at 10:21 pm to Dr. Lita Mains , who verbally acknowledged these results. Electronically Signed   By: Kristine Garbe M.D.   On: 01/13/2018 22:25    Anti-infectives: Anti-infectives (From admission, onward)   Start     Dose/Rate Route Frequency Ordered Stop   01/14/18 1400  ceFAZolin (ANCEF) IVPB 2g/100 mL premix     2 g 200 mL/hr over 30 Minutes Intravenous Every 8 hours 01/14/18 1331 01/15/18 1359   01/14/18 0802  ceFAZolin (ANCEF) 2-4 GM/100ML-% IVPB    Note to Pharmacy:  [facility]   : cabinet override      01/14/18 0802 01/14/18 0837   01/14/18 0800   ceFAZolin (ANCEF) IVPB 2g/100 mL premix     2 g 200 mL/hr over 30 Minutes Intravenous On call to O.R. 01/14/18 0749 01/14/18 0837      Assessment/Plan: s/p Procedure(s): ORIF PELVIC FRACTURE WITH PERCUTANEOUS SCREWS FACIAL LACERATION REPAIR Advance diet Will recheck labs this afternoon.  LOS: 1 day  Kathryne Eriksson. Dahlia Bailiff, MD, FACS [phone] Trauma Surgeon 01/14/2018

## 2018-01-14 NOTE — Progress Notes (Signed)
Dr Tresa Moore to patient room and spoke with patient. Patient has trace of bright red blood in urine and Dr Tresa Moore is aware. Will continue to monitor.
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
## 2018-01-14 NOTE — Anesthesia Postprocedure Evaluation (Signed)
Anesthesia Post Note  Patient: Taylor Ochoa  Procedure(s) Performed: ORIF PELVIC FRACTURE WITH PERCUTANEOUS SCREWS (Bilateral ) FACIAL LACERATION REPAIR (Right Face)     Patient location during evaluation: PACU Anesthesia Type: General Level of consciousness: awake and alert Pain management: pain level controlled Vital Signs Assessment: post-procedure vital signs reviewed and stable Respiratory status: spontaneous breathing, nonlabored ventilation, respiratory function stable and patient connected to nasal cannula oxygen Cardiovascular status: blood pressure returned to baseline and stable Postop Assessment: no apparent nausea or vomiting Anesthetic complications: no    Last Vitals:  Vitals:   01/14/18 0400 01/14/18 1132  BP: 112/80 (!) (P) 178/81  Pulse: 93 (!) (P) 111  Resp: 15 (P) 20  Temp:  (P) 36.5 C  SpO2: 91% (P) 97%    Last Pain:  Vitals:   01/14/18 0359  TempSrc:   PainSc: Asleep                 Lauren Aguayo

## 2018-01-14 NOTE — Progress Notes (Signed)
Patient called wife and let her know he is going down to sx

## 2018-01-14 NOTE — Anesthesia Procedure Notes (Signed)
Procedure Name: Intubation Date/Time: 01/14/2018 8:35 AM Performed by: Kealohilani Maiorino T, CRNA Pre-anesthesia Checklist: Patient identified, Emergency Drugs available, Suction available and Patient being monitored Patient Re-evaluated:Patient Re-evaluated prior to induction Oxygen Delivery Method: Circle system utilized Preoxygenation: Pre-oxygenation with 100% oxygen Induction Type: IV induction Ventilation: Mask ventilation without difficulty Laryngoscope Size: Glidescope and 4 Grade View: Grade I Tube type: Oral Tube size: 7.5 mm Number of attempts: 1 Airway Equipment and Method: Patient positioned with wedge pillow,  Stylet and Video-laryngoscopy Placement Confirmation: ETT inserted through vocal cords under direct vision,  positive ETCO2 and breath sounds checked- equal and bilateral Secured at: 22 cm Tube secured with: Tape Dental Injury: Teeth and Oropharynx as per pre-operative assessment  Difficulty Due To: Difficult Airway- due to cervical collar

## 2018-01-14 NOTE — Transfer of Care (Signed)
Immediate Anesthesia Transfer of Care Note  Patient: Taylor Ochoa  Procedure(s) Performed: ORIF PELVIC FRACTURE WITH PERCUTANEOUS SCREWS (Bilateral ) FACIAL LACERATION REPAIR (Right Face)  Patient Location: PACU  Anesthesia Type:General  Level of Consciousness: awake and drowsy  Airway & Oxygen Therapy: Patient Spontanous Breathing and Patient connected to nasal cannula oxygen  Post-op Assessment: Report given to RN, Post -op Vital signs reviewed and stable and Patient moving all extremities  Post vital signs: Reviewed and stable  Last Vitals:  Vitals Value Taken Time  BP    Temp    Pulse    Resp    SpO2      Last Pain:  Vitals:   01/14/18 0359  TempSrc:   PainSc: Asleep         Complications: No apparent anesthesia complications

## 2018-01-15 ENCOUNTER — Encounter (HOSPITAL_COMMUNITY): Payer: Self-pay | Admitting: Student

## 2018-01-15 ENCOUNTER — Inpatient Hospital Stay (HOSPITAL_COMMUNITY): Payer: 59

## 2018-01-15 LAB — BLOOD PRODUCT ORDER (VERBAL) VERIFICATION

## 2018-01-15 LAB — CBC
HEMATOCRIT: 24.6 % — AB (ref 39.0–52.0)
HEMATOCRIT: 28.4 % — AB (ref 39.0–52.0)
Hemoglobin: 8.4 g/dL — ABNORMAL LOW (ref 13.0–17.0)
Hemoglobin: 9.5 g/dL — ABNORMAL LOW (ref 13.0–17.0)
MCH: 31 pg (ref 26.0–34.0)
MCH: 30.6 pg (ref 26.0–34.0)
MCHC: 34.1 g/dL (ref 30.0–36.0)
MCHC: 33.5 g/dL (ref 30.0–36.0)
MCV: 90.8 fL (ref 78.0–100.0)
MCV: 91.6 fL (ref 78.0–100.0)
Platelets: 104 10*3/uL — ABNORMAL LOW (ref 150–400)
Platelets: 114 10*3/uL — ABNORMAL LOW (ref 150–400)
RBC: 2.71 MIL/uL — AB (ref 4.22–5.81)
RBC: 3.1 MIL/uL — ABNORMAL LOW (ref 4.22–5.81)
RDW: 16.6 % — ABNORMAL HIGH (ref 11.5–15.5)
RDW: 16.6 % — AB (ref 11.5–15.5)
WBC: 11.6 10*3/uL — ABNORMAL HIGH (ref 4.0–10.5)
WBC: 12.4 10*3/uL — AB (ref 4.0–10.5)

## 2018-01-15 LAB — BASIC METABOLIC PANEL
Anion gap: 6 (ref 5–15)
BUN: 19 mg/dL (ref 6–20)
CHLORIDE: 108 mmol/L (ref 101–111)
CO2: 23 mmol/L (ref 22–32)
Calcium: 7.6 mg/dL — ABNORMAL LOW (ref 8.9–10.3)
Creatinine, Ser: 0.97 mg/dL (ref 0.61–1.24)
GFR calc Af Amer: >60 mL/min (ref >60)
GFR calc non Af Amer: >60 mL/min (ref >60)
Glucose, Bld: 126 mg/dL — ABNORMAL HIGH (ref 65–99)
POTASSIUM: 4.2 mmol/L (ref 3.5–5.1)
Sodium: 137 mmol/L (ref 135–145)

## 2018-01-15 LAB — PREPARE RBC (CROSSMATCH)

## 2018-01-15 IMAGING — CT CT PELVIS W/O CM
2 of 4 series · 15 of 46 positions shown, 18 images · non-contrast
Comparison: [DATE] CT

CLINICAL DATA: Pelvic fracture fall

EXAM:
CT PELVIS WITHOUT CONTRAST
TECHNIQUE: Multidetector CT imaging of the pelvis was performed following the
standard protocol without intravenous contrast.

[Series 3: pelvis w/o 5.0 · axial · non-contrast · 0.91mm/px · z∈[-429,-179]mm · 12 of 56 slices shown, 15 images]
[im 4/56  soft-tissue]
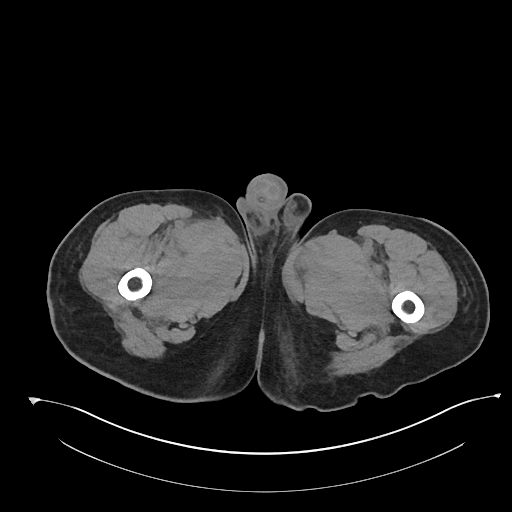
[im 4/56  bone]
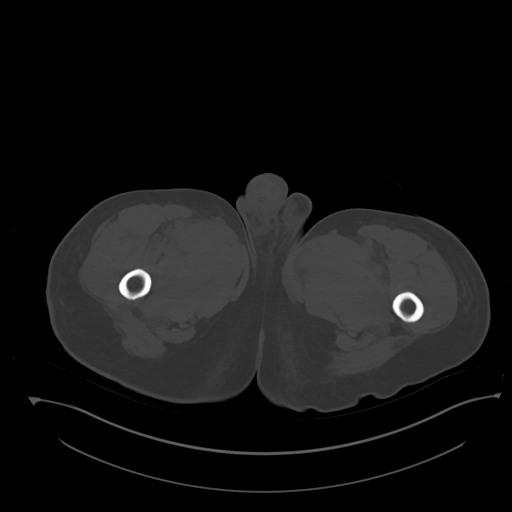
[im 11/56  soft-tissue]
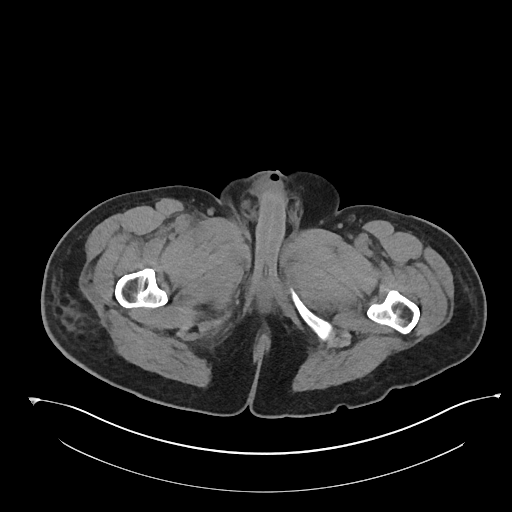
[im 16/56  soft-tissue]
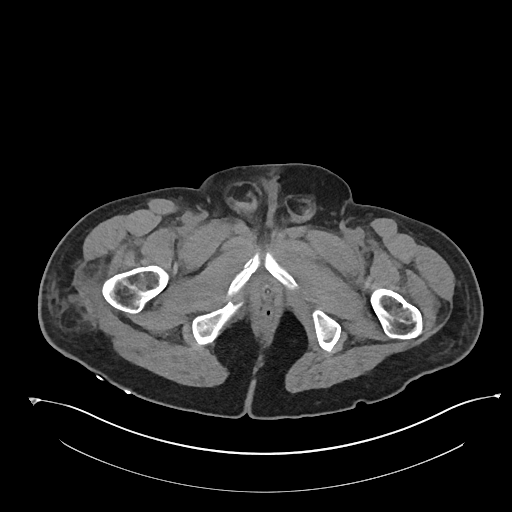
[im 22/56  soft-tissue]
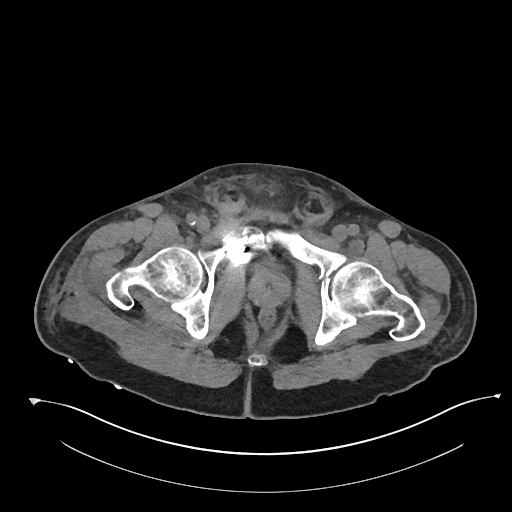
[im 29/56  soft-tissue]
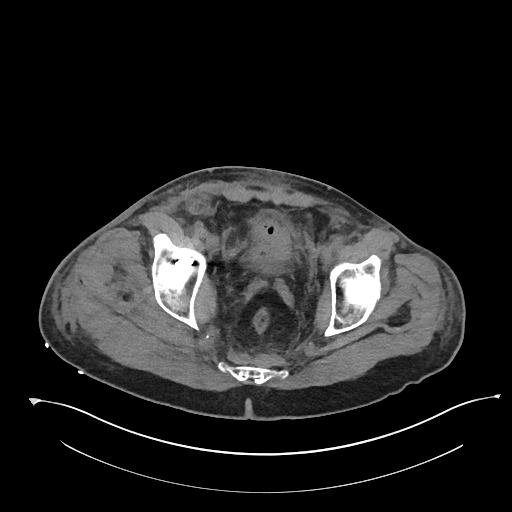
[im 34/56  soft-tissue]
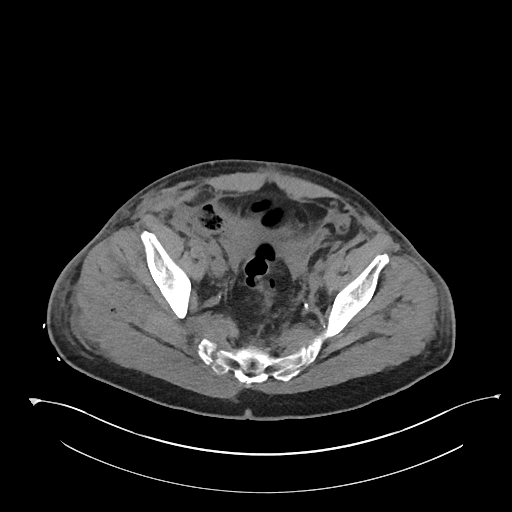
[im 40/56  soft-tissue]
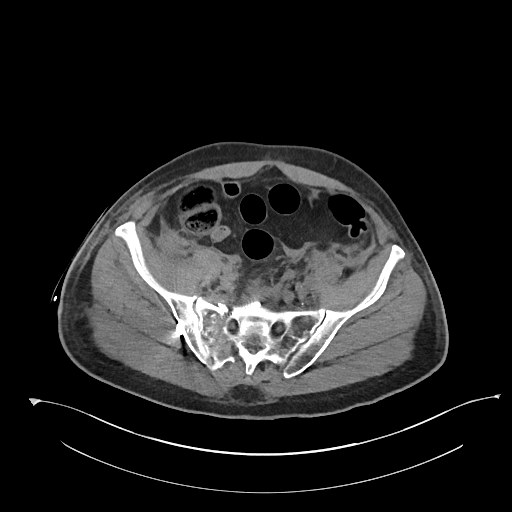
[im 47/56  soft-tissue]
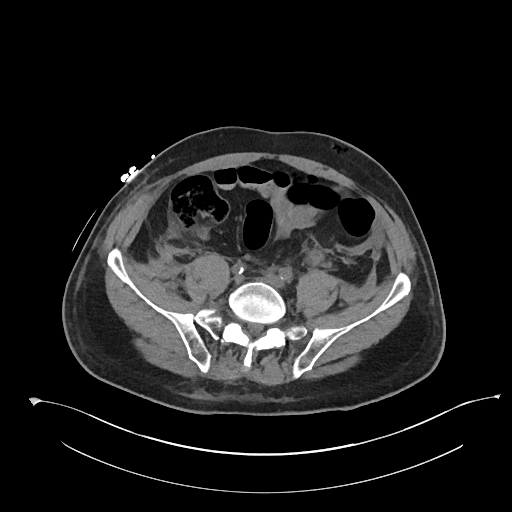
[im 48/56  lung]
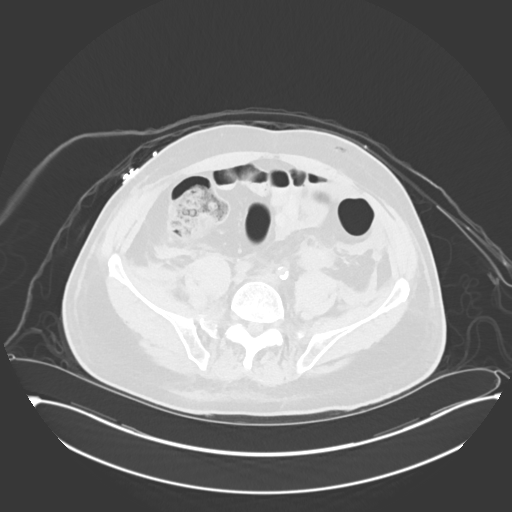
[im 50/56  lung]
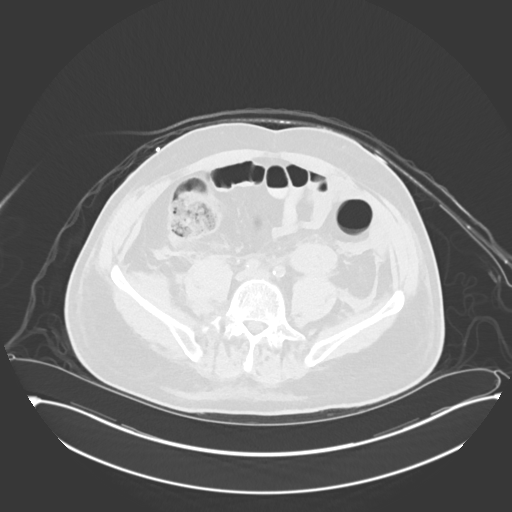
[im 52/56  soft-tissue]
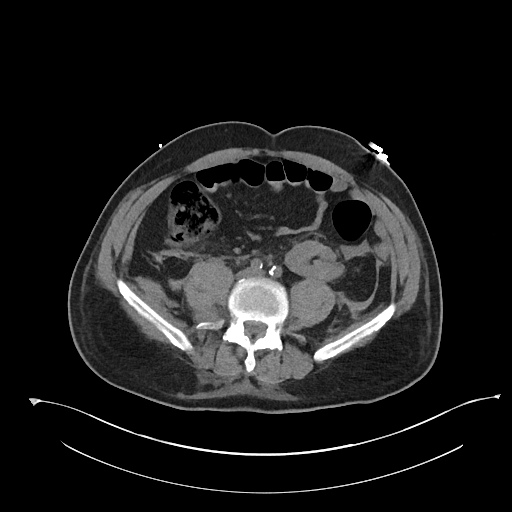
[im 52/56  lung]
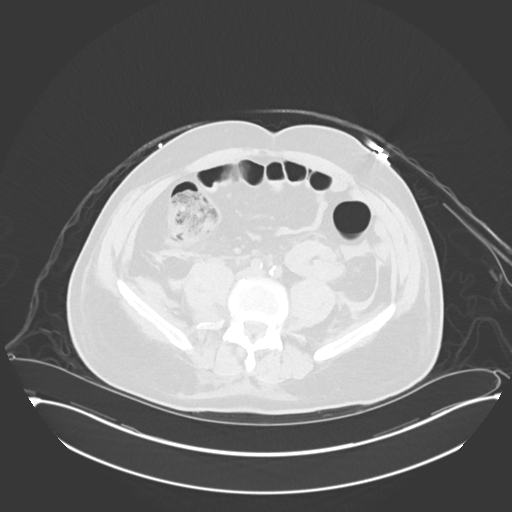
[im 52/56  bone]
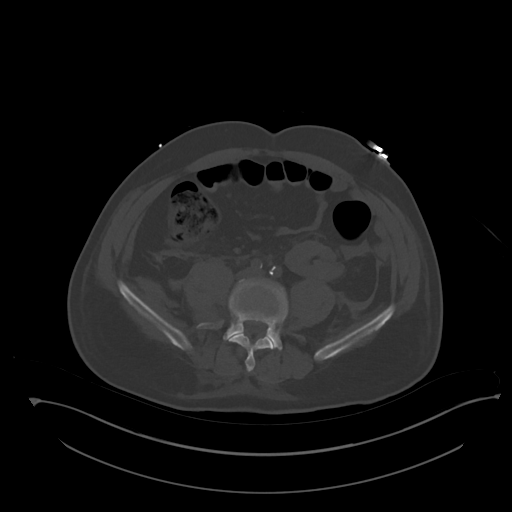
[im 54/56  lung]
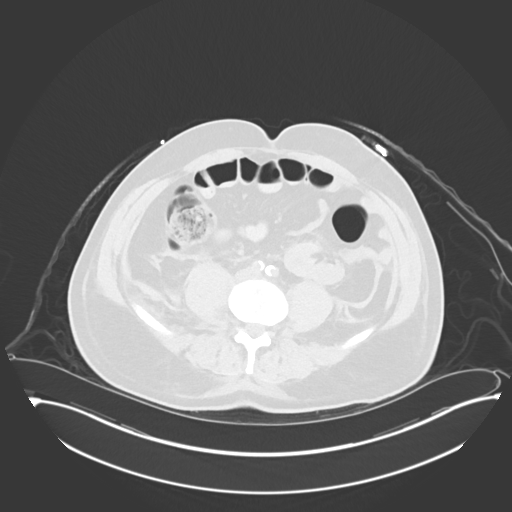

[Series 5: pelvis w/o 2.0 cor · coronal · non-contrast · 0.55mm/px · 3 of 133 slices shown]
[im 45/133  soft-tissue]
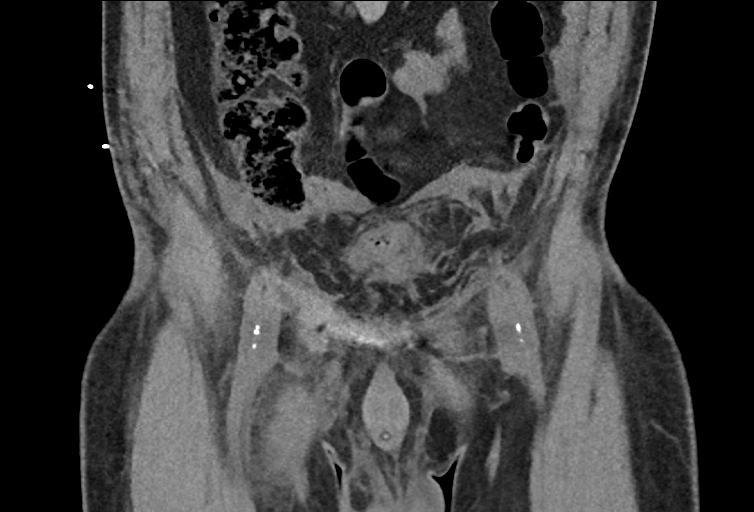
[im 59/133  soft-tissue]
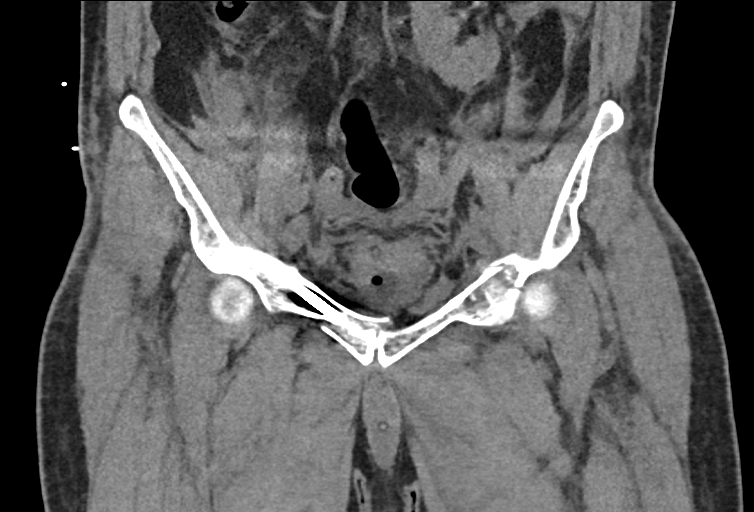
[im 74/133  soft-tissue]
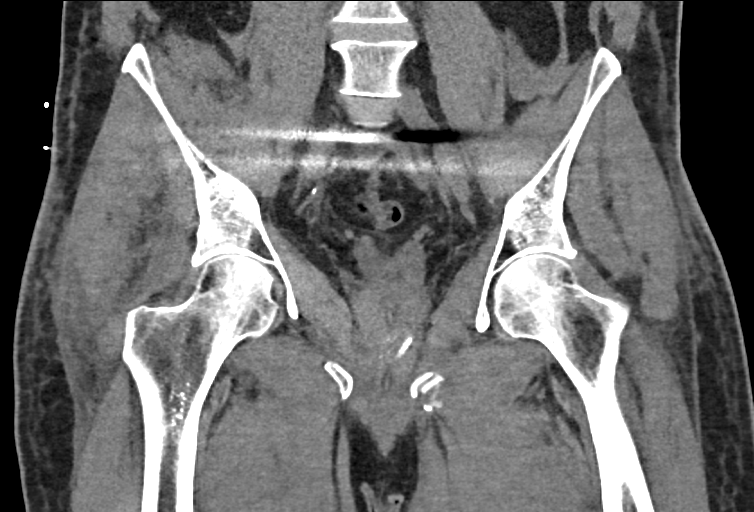

[15 of 46 positions shown; findings below may reference images not displayed]

FINDINGS: Urinary Tract: Decompressed urinary bladde by Foley catheter. No
urinary extravasation. Redemonstration of low lying left kidney,
partially pelvic in location. Lacerations are difficult to visualize
on current exam due to lack of IV contrast. Perinephric stranding is
seen on the left.

Bowel:  Unremarkable without mural hematoma.

Vascular/Lymphatic: Aortoiliac atherosclerosis. No aneurysm. No
active hemorrhage.

Reproductive:  Prostate is unremarkable.

Other: Stable retroperitoneal stranding and fluid extending along
the iliopsoas muscles with stable right psoas hematoma and presacral
hematoma.

Musculoskeletal: Redemonstration of comminuted fractures of both
inferior pubic rami, right superior pubic ramus, left anterior
column fracture, right sacral ale are fractures involving S1 through
S3. Mildly displaced right L5 transverse process fracture. New
fixation hardware spanning the SI joints and right superior pubic
ramus.
IMPRESSION: Fixation screws across the right sacral alar fracture and right
superior pubic ramus fracture.

No significant change in retroperitoneal, right psoas and presacral
hematomas. Small amount of complex free fluid in the pelvis as
before. No evidence of active hemorrhage or extravasation.

## 2018-01-15 MED ORDER — SODIUM CHLORIDE 0.9 % IV SOLN: Freq: Once | INTRAVENOUS | Status: DC
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
## 2018-01-15 NOTE — Progress Notes (Addendum)
   Daily Progress Note   Assessment/Planning:   Multisystem trauma, R CIA thromboembolism vs focal dissection   Pt's hemodynamics are stable.  At this point, would proceed with R CIA intervention tomorrow.  H/H trending down.  300-500 cc EBL is normal for an iliac thrombectomy, so consider transfusing today.  I will type and cross 2 u pRBC for OR use if needed  Ok to start DVT prophylaxis  Plan on R iliac thrombectomy, distal aortogram, possible right common iliac artery stenting.  Will plan on 30-60 minutes of therapeutic anticoagulation and then reverse it with Protamine intraoperatively.   The risk, benefits, and alternative for the proposed operations were discussed with the patient.   The patient is aware the risks include but are not limited to: bleeding, infection, myocardial infarction, stroke, nerve damage, limb edema, need for additional procedures in the future, and wound complications. The patient is aware of these risks and agreed to proceed.   Subjective  - 1 Day Post-Op   Sore from surgery yesterday   Objective   Vitals:   01/15/18 0432 01/15/18 0500 01/15/18 0600 01/15/18 0800  BP: 121/72 114/61 121/66 120/65  Pulse: 92 86 82 97  Resp: 14 10 11 12   Temp: 98.6 F (37 C)   98.6 F (37 C)  TempSrc: Oral   Oral  SpO2: 93% (!) 89% 92% 95%  Weight:      Height:         Intake/Output Summary (Last 24 hours) at 01/15/2018 1128 Last data filed at 01/15/2018 0800 Gross per 24 hour  Intake 3468.75 ml  Output 1150 ml  Net 2318.75 ml    VASC R groin echymosis, palp femoral and PT pulses, motor intact    Laboratory   CBC CBC Latest Ref Rng & Units 01/15/2018 01/14/2018 01/14/2018  WBC 4.0 - 10.5 K/uL 11.6(H) 12.5(H) 14.7(H)  Hemoglobin 13.0 - 17.0 g/dL 8.4(L) 10.4(L) 12.3(L)  Hematocrit 39.0 - 52.0 % 24.6(L) 31.0(L) 36.8(L)  Platelets 150 - 400 K/uL 104(L) 120(L) 163    BMET    Component Value Date/Time   NA 137 01/15/2018 0737   K 4.2 01/15/2018  0737   CL 108 01/15/2018 0737   CO2 23 01/15/2018 0737   GLUCOSE 126 (H) 01/15/2018 0737   BUN 19 01/15/2018 0737   CREATININE 0.97 01/15/2018 0737   CALCIUM 7.6 (L) 01/15/2018 0737   GFRNONAA >60 01/15/2018 0737   GFRAA >60 01/15/2018 3734     Adele Barthel, MD, FACS [facility] [place] Office: [phone] Pager: [phone]  01/15/2018, 11:28 AM
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
## 2018-01-16 ENCOUNTER — Encounter (HOSPITAL_COMMUNITY): Payer: Self-pay

## 2018-01-16 ENCOUNTER — Inpatient Hospital Stay (HOSPITAL_COMMUNITY): Payer: 59 | Admitting: Certified Registered"

## 2018-01-16 ENCOUNTER — Encounter (HOSPITAL_COMMUNITY): Admission: EM | Disposition: A | Discharge status: Skilled Nursing Facility | Payer: Self-pay | Source: Home / Self Care

## 2018-01-16 HISTORY — PX: THROMBECTOMY ILIAC ARTERY: SHX6405

## 2018-01-16 LAB — CBC
HEMATOCRIT: 26.4 % — AB (ref 39.0–52.0)
HEMOGLOBIN: 8.9 g/dL — AB (ref 13.0–17.0)
MCH: 30.9 pg (ref 26.0–34.0)
MCHC: 33.7 g/dL (ref 30.0–36.0)
MCV: 91.7 fL (ref 78.0–100.0)
Platelets: 111 10*3/uL — ABNORMAL LOW (ref 150–400)
RBC: 2.88 MIL/uL — AB (ref 4.22–5.81)
RDW: 16.4 % — ABNORMAL HIGH (ref 11.5–15.5)
WBC: 9.4 10*3/uL (ref 4.0–10.5)

## 2018-01-16 LAB — BASIC METABOLIC PANEL
Anion gap: 8 (ref 5–15)
BUN: 12 mg/dL (ref 6–20)
CHLORIDE: 107 mmol/L (ref 101–111)
CO2: 23 mmol/L (ref 22–32)
CREATININE: 0.87 mg/dL (ref 0.61–1.24)
Calcium: 7.9 mg/dL — ABNORMAL LOW (ref 8.9–10.3)
GFR calc Af Amer: >60 mL/min (ref >60)
GFR calc non Af Amer: >60 mL/min (ref >60)
Glucose, Bld: 111 mg/dL — ABNORMAL HIGH (ref 65–99)
POTASSIUM: 4.1 mmol/L (ref 3.5–5.1)
Sodium: 138 mmol/L (ref 135–145)

## 2018-01-16 SURGERY — THROMBECTOMY, ARTERY, ILIAC
Anesthesia: General | Site: Groin | Laterality: Right

## 2018-01-16 MED ORDER — OXYCODONE HCL 5 MG PO TABS: 5.0000 mg | ORAL_TABLET | Freq: Once | ORAL | Status: DC | PRN

## 2018-01-16 MED ORDER — PROPOFOL 10 MG/ML IV BOLUS
INTRAVENOUS | Status: DC | PRN
  Administered 2018-01-16: 150 mg via INTRAVENOUS

## 2018-01-16 MED ORDER — CEFAZOLIN SODIUM-DEXTROSE 2-4 GM/100ML-% IV SOLN
INTRAVENOUS | Status: AC
  Filled 2018-01-16: qty 100

## 2018-01-16 MED ORDER — 0.9 % SODIUM CHLORIDE (POUR BTL) OPTIME
TOPICAL | Status: DC | PRN
  Administered 2018-01-16: 1000 mL

## 2018-01-16 MED ORDER — ENOXAPARIN SODIUM 40 MG/0.4ML ~~LOC~~ SOLN
40.0000 mg | SUBCUTANEOUS | Status: DC
  Administered 2018-01-17 – 2018-01-21 (×5): 40 mg via SUBCUTANEOUS
  Filled 2018-01-16 (×5): qty 0.4

## 2018-01-16 MED ORDER — SODIUM CHLORIDE 0.9 % IV SOLN
INTRAVENOUS | Status: DC | PRN
  Administered 2018-01-16: 13:00:00

## 2018-01-16 MED ORDER — PROMETHAZINE HCL 25 MG/ML IJ SOLN: 6.2500 mg | INTRAMUSCULAR | Status: DC | PRN

## 2018-01-16 MED ORDER — ONDANSETRON HCL 4 MG/2ML IJ SOLN
INTRAMUSCULAR | Status: DC | PRN
  Administered 2018-01-16: 4 mg via INTRAVENOUS

## 2018-01-16 MED ORDER — HEPARIN SODIUM (PORCINE) 1000 UNIT/ML IJ SOLN
INTRAMUSCULAR | Status: AC
  Filled 2018-01-16: qty 1

## 2018-01-16 MED ORDER — HYDROMORPHONE HCL 2 MG/ML IJ SOLN: 0.2500 mg | INTRAMUSCULAR | Status: DC | PRN

## 2018-01-16 MED ORDER — ROCURONIUM BROMIDE 10 MG/ML (PF) SYRINGE
PREFILLED_SYRINGE | INTRAVENOUS | Status: AC
  Filled 2018-01-16: qty 5

## 2018-01-16 MED ORDER — OXYCODONE HCL 5 MG/5ML PO SOLN: 5.0000 mg | Freq: Once | ORAL | Status: DC | PRN

## 2018-01-16 MED ORDER — LACTATED RINGERS IV SOLN
INTRAVENOUS | Status: DC | PRN
  Administered 2018-01-16 (×2): via INTRAVENOUS

## 2018-01-16 MED ORDER — SUGAMMADEX SODIUM 200 MG/2ML IV SOLN
INTRAVENOUS | Status: AC
  Filled 2018-01-16: qty 2

## 2018-01-16 MED ORDER — LIDOCAINE 2% (20 MG/ML) 5 ML SYRINGE
INTRAMUSCULAR | Status: AC
  Filled 2018-01-16: qty 5

## 2018-01-16 MED ORDER — CEFAZOLIN SODIUM-DEXTROSE 2-3 GM-%(50ML) IV SOLR
INTRAVENOUS | Status: DC | PRN
  Administered 2018-01-16: 2 g via INTRAVENOUS

## 2018-01-16 MED ORDER — LIDOCAINE 2% (20 MG/ML) 5 ML SYRINGE
INTRAMUSCULAR | Status: DC | PRN
  Administered 2018-01-16: 80 mg via INTRAVENOUS

## 2018-01-16 MED ORDER — ROCURONIUM BROMIDE 10 MG/ML (PF) SYRINGE
PREFILLED_SYRINGE | INTRAVENOUS | Status: DC | PRN
  Administered 2018-01-16 (×2): 20 mg via INTRAVENOUS
  Administered 2018-01-16: 30 mg via INTRAVENOUS
  Administered 2018-01-16: 20 mg via INTRAVENOUS

## 2018-01-16 MED ORDER — PROTAMINE SULFATE 10 MG/ML IV SOLN
INTRAVENOUS | Status: DC | PRN
  Administered 2018-01-16: 40 mg via INTRAVENOUS

## 2018-01-16 MED ORDER — FENTANYL CITRATE (PF) 250 MCG/5ML IJ SOLN
INTRAMUSCULAR | Status: AC
  Filled 2018-01-16: qty 5

## 2018-01-16 MED ORDER — IODIXANOL 320 MG/ML IV SOLN
INTRAVENOUS | Status: DC | PRN
  Administered 2018-01-16: 100 mL

## 2018-01-16 MED ORDER — MIDAZOLAM HCL 2 MG/2ML IJ SOLN
INTRAMUSCULAR | Status: DC | PRN
  Administered 2018-01-16: 2 mg via INTRAVENOUS

## 2018-01-16 MED ORDER — HEMOSTATIC AGENTS (NO CHARGE) OPTIME
TOPICAL | Status: DC | PRN
  Administered 2018-01-16: 1 via TOPICAL

## 2018-01-16 MED ORDER — DEXAMETHASONE SODIUM PHOSPHATE 10 MG/ML IJ SOLN
INTRAMUSCULAR | Status: AC
  Filled 2018-01-16: qty 1

## 2018-01-16 MED ORDER — SODIUM CHLORIDE 0.9 % IV SOLN
INTRAVENOUS | Status: AC
  Filled 2018-01-16: qty 1.2

## 2018-01-16 MED ORDER — DEXAMETHASONE SODIUM PHOSPHATE 10 MG/ML IJ SOLN
INTRAMUSCULAR | Status: DC | PRN
  Administered 2018-01-16: 10 mg via INTRAVENOUS

## 2018-01-16 MED ORDER — LACTATED RINGERS IV SOLN
INTRAVENOUS | Status: DC
  Administered 2018-01-16: 11:00:00 via INTRAVENOUS

## 2018-01-16 MED ORDER — SUGAMMADEX SODIUM 200 MG/2ML IV SOLN
INTRAVENOUS | Status: DC | PRN
  Administered 2018-01-16: 200 mg via INTRAVENOUS

## 2018-01-16 MED ORDER — PROPOFOL 10 MG/ML IV BOLUS
INTRAVENOUS | Status: AC
  Filled 2018-01-16: qty 20

## 2018-01-16 MED ORDER — ONDANSETRON HCL 4 MG/2ML IJ SOLN
INTRAMUSCULAR | Status: AC
  Filled 2018-01-16: qty 2

## 2018-01-16 MED ORDER — MIDAZOLAM HCL 2 MG/2ML IJ SOLN
INTRAMUSCULAR | Status: AC
  Filled 2018-01-16: qty 2

## 2018-01-16 MED ORDER — HEPARIN SODIUM (PORCINE) 1000 UNIT/ML IJ SOLN
INTRAMUSCULAR | Status: DC | PRN
  Administered 2018-01-16: 7000 [IU] via INTRAVENOUS

## 2018-01-16 MED ORDER — FENTANYL CITRATE (PF) 250 MCG/5ML IJ SOLN
INTRAMUSCULAR | Status: DC | PRN
  Administered 2018-01-16: 50 ug via INTRAVENOUS
  Administered 2018-01-16: 100 ug via INTRAVENOUS

## 2018-01-16 MED ORDER — PHENYLEPHRINE 40 MCG/ML (10ML) SYRINGE FOR IV PUSH (FOR BLOOD PRESSURE SUPPORT)
PREFILLED_SYRINGE | INTRAVENOUS | Status: AC
  Filled 2018-01-16: qty 20

## 2018-01-16 MED ORDER — PHENYLEPHRINE 40 MCG/ML (10ML) SYRINGE FOR IV PUSH (FOR BLOOD PRESSURE SUPPORT)
PREFILLED_SYRINGE | INTRAVENOUS | Status: DC | PRN
  Administered 2018-01-16 (×5): 80 ug via INTRAVENOUS

## 2018-01-16 SURGICAL SUPPLY — 62 items
ADH SKN CLS LQ APL DERMABOND (GAUZE/BANDAGES/DRESSINGS) ×2
AGENT HMST SPONGE THK3/8 (HEMOSTASIS) ×2
BAG SNAP BAND KOVER 36X36 (MISCELLANEOUS) ×6 IMPLANT
CANISTER SUCT 3000ML PPV (MISCELLANEOUS) ×4 IMPLANT
CATH EMB 4FR 80CM (CATHETERS) ×3 IMPLANT
CATH EMB 5FR 80CM (CATHETERS) ×3 IMPLANT
CATH OMNI FLUSH 5F 65CM (CATHETERS) ×3 IMPLANT
CLIP VESOCCLUDE MED 24/CT (CLIP) ×4 IMPLANT
CLIP VESOCCLUDE MED 6/CT (CLIP) ×4 IMPLANT
CLIP VESOCCLUDE SM WIDE 6/CT (CLIP) ×4 IMPLANT
COVER DOME SNAP 22 D (MISCELLANEOUS) ×3 IMPLANT
COVER PROBE W GEL 5X96 (DRAPES) ×4 IMPLANT
DERMABOND ADHESIVE PROPEN (GAUZE/BANDAGES/DRESSINGS) ×2
DERMABOND ADVANCED .7 DNX6 (GAUZE/BANDAGES/DRESSINGS) ×1 IMPLANT
DRAIN CHANNEL 15F RND FF W/TCR (WOUND CARE) IMPLANT
ELECT REM PT RETURN 9FT ADLT (ELECTROSURGICAL) ×4
ELECTRODE REM PT RTRN 9FT ADLT (ELECTROSURGICAL) ×2 IMPLANT
EVACUATOR SILICONE 100CC (DRAIN) IMPLANT
GLOVE BIO SURGEON STRL SZ 6.5 (GLOVE) ×8 IMPLANT
GLOVE BIO SURGEON STRL SZ7 (GLOVE) ×7 IMPLANT
GLOVE BIO SURGEONS STRL SZ 6.5 (GLOVE) ×4
GLOVE BIOGEL PI IND STRL 6.5 (GLOVE) ×4 IMPLANT
GLOVE BIOGEL PI IND STRL 7.0 (GLOVE) ×3 IMPLANT
GLOVE BIOGEL PI IND STRL 7.5 (GLOVE) ×5 IMPLANT
GLOVE BIOGEL PI INDICATOR 6.5 (GLOVE) ×8
GLOVE BIOGEL PI INDICATOR 7.0 (GLOVE) ×6
GLOVE BIOGEL PI INDICATOR 7.5 (GLOVE) ×8
GLOVE ECLIPSE 7.0 STRL STRAW (GLOVE) ×9 IMPLANT
GOWN STRL REUS W/ TWL LRG LVL3 (GOWN DISPOSABLE) ×13 IMPLANT
GOWN STRL REUS W/TWL LRG LVL3 (GOWN DISPOSABLE) ×40
HEMOSTAT SPONGE AVITENE ULTRA (HEMOSTASIS) ×3 IMPLANT
KIT BASIN OR (CUSTOM PROCEDURE TRAY) ×4 IMPLANT
KIT TURNOVER KIT B (KITS) ×4 IMPLANT
NDL PERC 18GX7CM (NEEDLE) IMPLANT
NEEDLE PERC 18GX7CM (NEEDLE) ×4 IMPLANT
NS IRRIG 1000ML POUR BTL (IV SOLUTION) ×5 IMPLANT
PACK PERIPHERAL VASCULAR (CUSTOM PROCEDURE TRAY) ×4 IMPLANT
PAD ARMBOARD 7.5X6 YLW CONV (MISCELLANEOUS) ×8 IMPLANT
SET MICROPUNCTURE 5F STIFF (MISCELLANEOUS) IMPLANT
SHEATH AVANTI 11CM 5FR (SHEATH) ×3 IMPLANT
STOPCOCK 4 WAY LG BORE MALE ST (IV SETS) ×4 IMPLANT
SUT ETHILON 3 0 PS 1 (SUTURE) IMPLANT
SUT MNCRL AB 4-0 PS2 18 (SUTURE) ×6 IMPLANT
SUT PROLENE 5 0 C 1 24 (SUTURE) ×4 IMPLANT
SUT PROLENE 6 0 BV (SUTURE) ×10 IMPLANT
SUT PROLENE 7 0 BV 1 (SUTURE) ×3 IMPLANT
SUT PROLENE 7 0 BV1 MDA (SUTURE) ×3 IMPLANT
SUT SILK 2 0 PERMA HAND 18 BK (SUTURE) ×4 IMPLANT
SUT SILK 3 0 (SUTURE)
SUT SILK 3-0 18XBRD TIE 12 (SUTURE) IMPLANT
SUT VIC AB 2-0 CT1 27 (SUTURE) ×4
SUT VIC AB 2-0 CT1 TAPERPNT 27 (SUTURE) ×3 IMPLANT
SUT VIC AB 3-0 SH 27 (SUTURE) ×4
SUT VIC AB 3-0 SH 27X BRD (SUTURE) ×4 IMPLANT
SYR 10ML LL (SYRINGE) ×9 IMPLANT
SYR 3ML LL SCALE MARK (SYRINGE) ×6 IMPLANT
SYR MEDRAD MARK V 150ML (SYRINGE) ×3 IMPLANT
TOWEL GREEN STERILE (TOWEL DISPOSABLE) ×4 IMPLANT
TUBING EXTENTION W/L.L. (IV SETS) ×4 IMPLANT
TUBING HIGH PRESSURE 120CM (CONNECTOR) ×3 IMPLANT
UNDERPAD 30X30 (UNDERPADS AND DIAPERS) ×4 IMPLANT
WATER STERILE IRR 1000ML POUR (IV SOLUTION) ×4 IMPLANT

## 2018-01-16 NOTE — Interval H&P Note (Signed)
   History and Physical Update  The patient was interviewed and re-examined.  The patient's previous History and Physical has been reviewed and is unchanged from my consult except for: interval ORIF pelvic fx.  Patient continues to have pulses in his right foot.  There is no change in the plan of care: thrombectomy right iliac artery, aortogram, and possible stenting.  The risk, benefits, and alternative for thrombectomy operations were discussed with the patient.   The patient is aware the risks include but are not limited to: bleeding, infection, myocardial infarction, stroke, limb loss, nerve damage, limb edema, need for additional procedures in the future, and wound complications.   I discussed with the patient the nature of angiographic procedures, especially the limited patencies of any endovascular intervention.    The patient is aware of that the risks of an angiographic procedure include but are not limited to: bleeding, infection, access site complications, renal failure, embolization, rupture of vessel, dissection, arteriovenous fistula, possible need for emergent surgical intervention, possible need for surgical procedures to treat the patient's pathology, anaphylactic reaction to contrast, and stroke and death.    The patient is aware of the risks and agrees to proceed.   Adele Barthel, MD, FACS [facility] Office: [phone] Pager: [phone]  01/16/2018, 11:40 AM

## 2018-01-16 NOTE — Anesthesia Postprocedure Evaluation (Signed)
Anesthesia Post Note  Patient: [name]  Procedure(s) Performed: THROMBECTOMY ILIAC ARTERY RIGHT (Right Groin) ANGIOGRAM EXTREMITY LOWER RIGHT (Right Groin)     Patient location during evaluation: PACU Anesthesia Type: General Level of consciousness: awake and alert Pain management: pain level controlled Vital Signs Assessment: post-procedure vital signs reviewed and stable Respiratory status: spontaneous breathing, nonlabored ventilation, respiratory function stable and patient connected to nasal cannula oxygen Cardiovascular status: blood pressure returned to baseline and stable Postop Assessment: no apparent nausea or vomiting Anesthetic complications: no    Last Vitals:  Vitals:   01/16/18 1432 01/16/18 1600  BP: (!) 147/82   Pulse:    Resp:    Temp: (!) 36.3 C (!) 36.2 C  SpO2:      Last Pain:  Vitals:   01/16/18 1600  TempSrc:   PainSc: 0-No pain                 Myshawn Chiriboga P Arriana Lohmann

## 2018-01-16 NOTE — Anesthesia Preprocedure Evaluation (Addendum)
Anesthesia Evaluation  Patient identified by MRN, date of birth, ID band Patient awake    Reviewed: Allergy & Precautions, NPO status , Patient's Chart, lab work & pertinent test results  Airway Mallampati: II  TM Distance: >3 FB Neck ROM: Full    Dental  (+) Edentulous Lower, Edentulous Upper, Poor Dentition, Chipped   Pulmonary Current Smoker,    Pulmonary exam normal breath sounds clear to auscultation       Cardiovascular negative cardio ROS Normal cardiovascular exam Rhythm:Regular Rate:Normal     Neuro/Psych negative neurological ROS  negative psych ROS   GI/Hepatic Neg liver ROS, GERD  Medicated and Controlled,  Endo/Other  negative endocrine ROS  Renal/GU negative Renal ROS     Musculoskeletal negative musculoskeletal ROS (+)   Abdominal   Peds  Hematology  (+) anemia ,   Anesthesia Other Findings TRAUMA PELVIC FRACTURE  Reproductive/Obstetrics                            Anesthesia Physical Anesthesia Plan  ASA: III  Anesthesia Plan: General   Post-op Pain Management:    Induction: Intravenous  PONV Risk Score and Plan: 1 and Ondansetron, Dexamethasone, Midazolam and Treatment may vary due to age or medical condition  Airway Management Planned: LMA  Additional Equipment:   Intra-op Plan:   Post-operative Plan: Extubation in OR  Informed Consent: I have reviewed the patients History and Physical, chart, labs and discussed the procedure including the risks, benefits and alternatives for the proposed anesthesia with the patient or authorized representative who has indicated his/her understanding and acceptance.   Dental advisory given  Plan Discussed with: CRNA  Anesthesia Plan Comments:        Anesthesia Quick Evaluation

## 2018-01-16 NOTE — Transfer of Care (Signed)
Immediate Anesthesia Transfer of Care Note  Patient: Taylor Ochoa  Procedure(s) Performed: THROMBECTOMY ILIAC ARTERY RIGHT (Right Groin) ANGIOGRAM EXTREMITY LOWER RIGHT (Right Groin)  Patient Location: PACU  Anesthesia Type:General  Level of Consciousness: awake, alert  and oriented  Airway & Oxygen Therapy: Patient Spontanous Breathing and Patient connected to face mask oxygen  Post-op Assessment: Report given to RN and Post -op Vital signs reviewed and stable  Post vital signs: Reviewed and stable  Last Vitals:  Vitals Value Taken Time  BP    Temp    Pulse    Resp    SpO2      Last Pain:  Vitals:   01/16/18 1010  TempSrc:   PainSc: 6          Complications: No apparent anesthesia complications

## 2018-01-16 NOTE — Anesthesia Procedure Notes (Signed)
Procedure Name: Intubation Date/Time: 01/16/2018 12:48 PM Performed by: Teressa Lower., CRNA Pre-anesthesia Checklist: Patient identified, Emergency Drugs available, Suction available and Patient being monitored Patient Re-evaluated:Patient Re-evaluated prior to induction Oxygen Delivery Method: Circle system utilized Preoxygenation: Pre-oxygenation with 100% oxygen Induction Type: IV induction Ventilation: Mask ventilation without difficulty Laryngoscope Size: Miller and 2 Grade View: Grade I Tube type: Oral Tube size: 7.5 mm Number of attempts: 1 Airway Equipment and Method: Stylet and Oral airway Placement Confirmation: ETT inserted through vocal cords under direct vision,  positive ETCO2 and breath sounds checked- equal and bilateral Secured at: 22 cm Tube secured with: Tape Dental Injury: Teeth and Oropharynx as per pre-operative assessment
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
## 2018-01-16 NOTE — Progress Notes (Signed)
OT Cancellation Note  Patient Details Name: Taylor Ochoa MRN: [id number] DOB: Nov 17, 1955   Cancelled Treatment:    Reason Eval/Treat Not Completed: Patient at procedure or test/ unavailable. Pt currently in surgery for R CIA thromboembolism vs focal dissection.    Almon Register [phone] 01/16/2018, 11:21 AM
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
## 2018-01-17 ENCOUNTER — Encounter (HOSPITAL_COMMUNITY): Payer: Self-pay | Admitting: Vascular Surgery

## 2018-01-17 LAB — TYPE AND SCREEN
ABO/RH(D): O POS
Antibody Screen: NEGATIVE
UNIT DIVISION: 0
UNIT DIVISION: 0
Unit division: 0
Unit division: 0

## 2018-01-17 LAB — CBC
HEMATOCRIT: 22.6 % — AB (ref 39.0–52.0)
HEMOGLOBIN: 7.7 g/dL — AB (ref 13.0–17.0)
MCH: 31.2 pg (ref 26.0–34.0)
MCHC: 34.1 g/dL (ref 30.0–36.0)
MCV: 91.5 fL (ref 78.0–100.0)
Platelets: 122 10*3/uL — ABNORMAL LOW (ref 150–400)
RBC: 2.47 MIL/uL — ABNORMAL LOW (ref 4.22–5.81)
RDW: 15.4 % (ref 11.5–15.5)
WBC: 8.5 10*3/uL (ref 4.0–10.5)

## 2018-01-17 LAB — BPAM RBC
BLOOD PRODUCT EXPIRATION DATE: 201905242359
Blood Product Expiration Date: 201905022359
Blood Product Expiration Date: 201905222359
Blood Product Expiration Date: 201905222359
ISSUE DATE / TIME: 201904231826
ISSUE DATE / TIME: 201904231826
UNIT TYPE AND RH: 9500
Unit Type and Rh: 5100
Unit Type and Rh: 5100
Unit Type and Rh: 9500

## 2018-01-17 LAB — BASIC METABOLIC PANEL
Anion gap: 7 (ref 5–15)
BUN: 13 mg/dL (ref 6–20)
CHLORIDE: 103 mmol/L (ref 101–111)
CO2: 28 mmol/L (ref 22–32)
CREATININE: 0.84 mg/dL (ref 0.61–1.24)
Calcium: 8.2 mg/dL — ABNORMAL LOW (ref 8.9–10.3)
GFR calc Af Amer: >60 mL/min (ref >60)
GFR calc non Af Amer: >60 mL/min (ref >60)
Glucose, Bld: 124 mg/dL — ABNORMAL HIGH (ref 65–99)
POTASSIUM: 4.3 mmol/L (ref 3.5–5.1)
SODIUM: 138 mmol/L (ref 135–145)

## 2018-01-17 MED ORDER — METHOCARBAMOL 500 MG PO TABS
500.0000 mg | ORAL_TABLET | Freq: Four times a day (QID) | ORAL | Status: DC | PRN
Start: 2018-01-17 — End: 2018-01-21
  Administered 2018-01-17 – 2018-01-20 (×7): 500 mg via ORAL
  Filled 2018-01-17 (×7): qty 1

## 2018-01-17 MED ORDER — POLYETHYLENE GLYCOL 3350 17 G PO PACK
17.0000 g | PACK | Freq: Every day | ORAL | Status: DC
  Administered 2018-01-17 – 2018-01-21 (×5): 17 g via ORAL
  Filled 2018-01-17 (×5): qty 1

## 2018-01-17 MED ORDER — BACITRACIN ZINC 500 UNIT/GM EX OINT
TOPICAL_OINTMENT | Freq: Two times a day (BID) | CUTANEOUS | Status: DC
  Administered 2018-01-17 – 2018-01-21 (×9): via TOPICAL
  Filled 2018-01-17: qty 28.35
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
## 2018-01-17 NOTE — Progress Notes (Signed)
   Daily Progress Note   Assessment/Planning:   POD #1 s/p R iliac thrombectomy   In retrospect, pt had a non-flow limiting focal dissection flap that was intruding into the lumen of the R CIA.  This was mechanically extracted with a 5 Fogarty balloon.    Completion imaging demonstrated NO residual defect or distal embolization.  Would minimally give the patient: ECASA 81 mg PO daily and consider Plavix 75 mg PO daily for at least one month.  We don't normally fully anticoagulate for endarterectomies and this would be a more limited equivalent.  Pt can follow up with me in 2 weeks for R groin check  Will check on pt on Monday if still here   Subjective  - 1 Day Post-Op   Sore at incision   Objective   Vitals:   01/16/18 2000 01/17/18 0035 01/17/18 0420 01/17/18 0800  BP: 129/72 112/66 115/71 138/75  Pulse: 88 78 72 72  Resp: 13 12 12 15   Temp:  98.4 F (36.9 C) 98.4 F (36.9 C)   TempSrc:  Oral Oral   SpO2: 96% 96% 96% 98%  Weight:      Height:         Intake/Output Summary (Last 24 hours) at 01/17/2018 0846 Last data filed at 01/17/2018 0421 Gross per 24 hour  Intake 2000 ml  Output 3425 ml  Net -1425 ml    PULM  CTAB  CV  RRR  GI  soft, NTND  VASC R groin inc c/d/i, +echymosis (present before procedure)  NEURO M/S and sensation intact in R foot    Laboratory   CBC CBC Latest Ref Rng & Units 01/17/2018 01/16/2018 01/15/2018  WBC 4.0 - 10.5 K/uL 8.5 9.4 12.4(H)  Hemoglobin 13.0 - 17.0 g/dL 7.7(L) 8.9(L) 9.5(L)  Hematocrit 39.0 - 52.0 % 22.6(L) 26.4(L) 28.4(L)  Platelets 150 - 400 K/uL 122(L) 111(L) 114(L)    BMET    Component Value Date/Time   NA 138 01/17/2018 0346   K 4.3 01/17/2018 0346   CL 103 01/17/2018 0346   CO2 28 01/17/2018 0346   GLUCOSE 124 (H) 01/17/2018 0346   BUN 13 01/17/2018 0346   CREATININE 0.84 01/17/2018 0346   CALCIUM 8.2 (L) 01/17/2018 0346   GFRNONAA >60 01/17/2018 0346   GFRAA >60 01/17/2018 0346     Adele Barthel, MD, FACS [facility] Office: [phone] Pager: [phone]  01/17/2018, 8:46 AM
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
## 2018-01-18 LAB — CBC
HEMATOCRIT: 23 % — AB (ref 39.0–52.0)
HEMOGLOBIN: 7.7 g/dL — AB (ref 13.0–17.0)
MCH: 31 pg (ref 26.0–34.0)
MCHC: 33.5 g/dL (ref 30.0–36.0)
MCV: 92.7 fL (ref 78.0–100.0)
Platelets: 150 10*3/uL (ref 150–400)
RBC: 2.48 MIL/uL — AB (ref 4.22–5.81)
RDW: 15.5 % (ref 11.5–15.5)
WBC: 7.5 10*3/uL (ref 4.0–10.5)

## 2018-01-18 MED ORDER — BISACODYL 10 MG RE SUPP
10.0000 mg | Freq: Once | RECTAL | Status: AC
  Administered 2018-01-18: 10 mg via RECTAL
  Filled 2018-01-18: qty 1
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
## 2018-01-19 ENCOUNTER — Telehealth: Payer: Self-pay | Admitting: Vascular Surgery

## 2018-01-19 MED ORDER — CLOPIDOGREL BISULFATE 75 MG PO TABS: 75.0000 mg | ORAL_TABLET | Freq: Every day | ORAL | Status: DC
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
## 2018-01-19 NOTE — Telephone Encounter (Signed)
-----   Message from Mena Goes, RN sent at 01/19/2018 11:08 AM EDT ----- Regarding: 4 weeks   ----- Message ----- From: Conrad [place], MD Sent: 01/16/2018   3:44 PM To: [address]  [name] [id number] [birth year]  PROCEDURE: Right iliac artery thrombectomy Limited right common femoral artery endarterectomy Open cannulation of right common femoral artery  Bilateral pelvic angiogram Right leg runoff  Asst: Gerri Lins, [facility]   Follow-up: 4 weeks

## 2018-01-19 NOTE — Progress Notes (Addendum)
[facility]  Subjective  - Doing OK over all, sore.   Objective 135/84 71 97.8 F (36.6 C) (Oral) 15 95%  Intake/Output Summary (Last 24 hours) at 01/19/2018 0730 Last data filed at 01/19/2018 0300 Gross per 24 hour  Intake 600 ml  Output 2000 ml  Net -1400 ml    Right groin soft with out hematoma, incision healing well. Palpable pedal pulses B, active range of motion and sensation intact distally. Abdomin soft, NTTP Heart RRR  Assessment/Planning: POD # 3 Right iliac thrombectomy  He has palpable pulse B LE.  Incision is healing well. Consider Plavix for 1 month and aspirin 81 mg.  F/U with Dr. Bridgett Larsson in 2 weeks for incisional check.   Disposition stable from a vascular point of view.  Roxy Horseman 01/19/2018 7:30 AM --  Laboratory Lab Results: Recent Labs    01/17/18 0346 01/18/18 0454  WBC 8.5 7.5  HGB 7.7* 7.7*  HCT 22.6* 23.0*  PLT 122* 150   BMET Recent Labs    01/17/18 0346  NA 138  K 4.3  CL 103  CO2 28  GLUCOSE 124*  BUN 13  CREATININE 0.84  CALCIUM 8.2*    COAG Lab Results  Component Value Date   INR 1.13 01/13/2018   No results found for: PTT  Addendum  I have independently interviewed and examined the patient, and I agree with the physician assistant's findings.  Minimally patient needs 81 mg of ECASA, though prefer 75 mg of Plavix.  I don't think both area needed.  Antiplatelet needed for mechanical extraction of right common iliac artery dissection flap.  - will defer to Trauma when to start anti-platelet as obviously it could potentiate bleeding from the pelvis. - Will see pt in couple of weeks.  Adele Barthel, MD, FACS [facility] Office: [phone] Pager: [phone]  01/19/2018, 10:29 AM

## 2018-01-19 NOTE — Telephone Encounter (Signed)
2 weeks postop wound check  Received: Today  Message Contents  McChesney, Tania Ade, RN  P [facility] Admin Pool      Previous Messages    ----- Message -----  From: Conrad Marana, MD  Sent: 01/19/2018 10:33 AM  To: [address]   TARREN VELARDI  [id number]  [birth year]   F/U 2 weeks for R groin check after R CIA thrombectomy

## 2018-01-19 NOTE — Telephone Encounter (Signed)
Sched 2 w f/u 02/04/18 at 9:15. Sched 4w f/u 02/18/18 at 3:00. Spoke to pt's daughter. She thinks he will be going to a rehab facility but is not sure. Will call us back later for appt details.
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
## 2018-01-20 ENCOUNTER — Encounter (HOSPITAL_COMMUNITY): Payer: Self-pay | Admitting: Physical Medicine and Rehabilitation

## 2018-01-20 DIAGNOSIS — S32810A Multiple fractures of pelvis with stable disruption of pelvic ring, initial encounter for closed fracture: Principal | ICD-10-CM

## 2018-01-20 DIAGNOSIS — D72829 Elevated white blood cell count, unspecified: Secondary | ICD-10-CM

## 2018-01-20 DIAGNOSIS — Z720 Tobacco use: Secondary | ICD-10-CM

## 2018-01-20 DIAGNOSIS — K219 Gastro-esophageal reflux disease without esophagitis: Secondary | ICD-10-CM

## 2018-01-20 DIAGNOSIS — D62 Acute posthemorrhagic anemia: Secondary | ICD-10-CM

## 2018-01-20 DIAGNOSIS — R0682 Tachypnea, not elsewhere classified: Secondary | ICD-10-CM

## 2018-01-20 LAB — CBC
HCT: 27.8 % — ABNORMAL LOW (ref 39.0–52.0)
HEMOGLOBIN: 9.2 g/dL — AB (ref 13.0–17.0)
MCH: 31.1 pg (ref 26.0–34.0)
MCHC: 33.1 g/dL (ref 30.0–36.0)
MCV: 93.9 fL (ref 78.0–100.0)
PLATELETS: 233 10*3/uL (ref 150–400)
RBC: 2.96 MIL/uL — AB (ref 4.22–5.81)
RDW: 16.3 % — ABNORMAL HIGH (ref 11.5–15.5)
WBC: 11.7 10*3/uL — AB (ref 4.0–10.5)

## 2018-01-20 LAB — BASIC METABOLIC PANEL
ANION GAP: 12 (ref 5–15)
BUN: 16 mg/dL (ref 6–20)
CHLORIDE: 98 mmol/L — AB (ref 101–111)
CO2: 25 mmol/L (ref 22–32)
Calcium: 8.8 mg/dL — ABNORMAL LOW (ref 8.9–10.3)
Creatinine, Ser: 0.81 mg/dL (ref 0.61–1.24)
GFR calc Af Amer: >60 mL/min (ref >60)
Glucose, Bld: 138 mg/dL — ABNORMAL HIGH (ref 65–99)
POTASSIUM: 4.1 mmol/L (ref 3.5–5.1)
Sodium: 135 mmol/L (ref 135–145)

## 2018-01-20 MED ORDER — ASPIRIN EC 81 MG PO TBEC
81.0000 mg | DELAYED_RELEASE_TABLET | Freq: Every day | ORAL | Status: DC
  Administered 2018-01-21: 81 mg via ORAL
  Filled 2018-01-20: qty 1

## 2018-01-20 MED ORDER — GABAPENTIN 300 MG PO CAPS
300.0000 mg | ORAL_CAPSULE | Freq: Three times a day (TID) | ORAL | Status: DC
  Administered 2018-01-20 – 2018-01-21 (×4): 300 mg via ORAL
  Filled 2018-01-20 (×4): qty 1
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
## 2018-01-20 NOTE — Progress Notes (Signed)
Rehab Admissions Coordinator Note:  Patient was screened by Cleatrice Burke for appropriateness for an Inpatient Acute Rehab Consult per OT recommendation.   At this time, we are recommending Inpatient Rehab consult.  Cleatrice Burke 01/20/2018, 9:12 AM  I can be reached at [phone].
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
## 2018-01-21 DIAGNOSIS — R2241 Localized swelling, mass and lump, right lower limb: Secondary | ICD-10-CM | POA: Diagnosis not present

## 2018-01-21 DIAGNOSIS — R945 Abnormal results of liver function studies: Secondary | ICD-10-CM | POA: Diagnosis not present

## 2018-01-21 DIAGNOSIS — Z743 Need for continuous supervision: Secondary | ICD-10-CM | POA: Diagnosis not present

## 2018-01-21 DIAGNOSIS — S32301D Unspecified fracture of right ilium, subsequent encounter for fracture with routine healing: Secondary | ICD-10-CM | POA: Diagnosis not present

## 2018-01-21 DIAGNOSIS — L559 Sunburn, unspecified: Secondary | ICD-10-CM | POA: Diagnosis not present

## 2018-01-21 DIAGNOSIS — S0181XA Laceration without foreign body of other part of head, initial encounter: Secondary | ICD-10-CM | POA: Diagnosis not present

## 2018-01-21 DIAGNOSIS — I8290 Acute embolism and thrombosis of unspecified vein: Secondary | ICD-10-CM | POA: Diagnosis not present

## 2018-01-21 DIAGNOSIS — S32810D Multiple fractures of pelvis with stable disruption of pelvic ring, subsequent encounter for fracture with routine healing: Secondary | ICD-10-CM | POA: Diagnosis not present

## 2018-01-21 DIAGNOSIS — M6281 Muscle weakness (generalized): Secondary | ICD-10-CM | POA: Diagnosis not present

## 2018-01-21 DIAGNOSIS — R600 Localized edema: Secondary | ICD-10-CM | POA: Diagnosis not present

## 2018-01-21 DIAGNOSIS — S37032A Laceration of left kidney, unspecified degree, initial encounter: Secondary | ICD-10-CM | POA: Diagnosis not present

## 2018-01-21 DIAGNOSIS — S32402S Unspecified fracture of left acetabulum, sequela: Secondary | ICD-10-CM | POA: Diagnosis not present

## 2018-01-21 DIAGNOSIS — W57XXXA Bitten or stung by nonvenomous insect and other nonvenomous arthropods, initial encounter: Secondary | ICD-10-CM | POA: Diagnosis not present

## 2018-01-21 DIAGNOSIS — Z4789 Encounter for other orthopedic aftercare: Secondary | ICD-10-CM | POA: Diagnosis not present

## 2018-01-21 DIAGNOSIS — R2689 Other abnormalities of gait and mobility: Secondary | ICD-10-CM | POA: Diagnosis not present

## 2018-01-21 DIAGNOSIS — R52 Pain, unspecified: Secondary | ICD-10-CM | POA: Diagnosis not present

## 2018-01-21 DIAGNOSIS — G8911 Acute pain due to trauma: Secondary | ICD-10-CM | POA: Diagnosis not present

## 2018-01-21 DIAGNOSIS — K219 Gastro-esophageal reflux disease without esophagitis: Secondary | ICD-10-CM | POA: Diagnosis not present

## 2018-01-21 DIAGNOSIS — R279 Unspecified lack of coordination: Secondary | ICD-10-CM | POA: Diagnosis not present

## 2018-01-21 DIAGNOSIS — S37032D Laceration of left kidney, unspecified degree, subsequent encounter: Secondary | ICD-10-CM | POA: Diagnosis not present

## 2018-01-21 DIAGNOSIS — M25551 Pain in right hip: Secondary | ICD-10-CM | POA: Diagnosis not present

## 2018-01-21 DIAGNOSIS — S32401D Unspecified fracture of right acetabulum, subsequent encounter for fracture with routine healing: Secondary | ICD-10-CM | POA: Diagnosis not present

## 2018-01-21 DIAGNOSIS — R82998 Other abnormal findings in urine: Secondary | ICD-10-CM | POA: Diagnosis not present

## 2018-01-21 DIAGNOSIS — S329XXD Fracture of unspecified parts of lumbosacral spine and pelvis, subsequent encounter for fracture with routine healing: Secondary | ICD-10-CM | POA: Diagnosis not present

## 2018-01-21 DIAGNOSIS — S329XXA Fracture of unspecified parts of lumbosacral spine and pelvis, initial encounter for closed fracture: Secondary | ICD-10-CM | POA: Diagnosis not present

## 2018-01-21 LAB — CBC
HCT: 26.9 % — ABNORMAL LOW (ref 39.0–52.0)
Hemoglobin: 8.9 g/dL — ABNORMAL LOW (ref 13.0–17.0)
MCH: 31.6 pg (ref 26.0–34.0)
MCHC: 33.1 g/dL (ref 30.0–36.0)
MCV: 95.4 fL (ref 78.0–100.0)
PLATELETS: 245 10*3/uL (ref 150–400)
RBC: 2.82 MIL/uL — ABNORMAL LOW (ref 4.22–5.81)
RDW: 16.8 % — ABNORMAL HIGH (ref 11.5–15.5)
WBC: 11 10*3/uL — AB (ref 4.0–10.5)

## 2018-01-21 MED ORDER — GABAPENTIN 300 MG PO CAPS: 300.0000 mg | ORAL_CAPSULE | Freq: Three times a day (TID) | ORAL | 0 refills | Status: DC

## 2018-01-21 MED ORDER — OXYCODONE HCL 5 MG PO TABS: 5.0000 mg | ORAL_TABLET | ORAL | 0 refills | Status: DC | PRN

## 2018-01-21 MED ORDER — CLOPIDOGREL BISULFATE 75 MG PO TABS
75.0000 mg | ORAL_TABLET | Freq: Every day | ORAL | Status: DC
  Administered 2018-01-21: 75 mg via ORAL
  Filled 2018-01-21: qty 1

## 2018-01-21 MED ORDER — BACITRACIN ZINC 500 UNIT/GM EX OINT: TOPICAL_OINTMENT | Freq: Two times a day (BID) | CUTANEOUS | 0 refills | Status: DC

## 2018-01-21 MED ORDER — TRAMADOL HCL 50 MG PO TABS: 50.0000 mg | ORAL_TABLET | Freq: Four times a day (QID) | ORAL | 0 refills | Status: DC | PRN

## 2018-01-21 MED ORDER — ACETAMINOPHEN 500 MG PO TABS: 1000.0000 mg | ORAL_TABLET | Freq: Three times a day (TID) | ORAL | 0 refills | Status: DC

## 2018-01-21 MED ORDER — PANTOPRAZOLE SODIUM 40 MG PO TBEC: 40.0000 mg | DELAYED_RELEASE_TABLET | Freq: Every day | ORAL | 0 refills | Status: DC

## 2018-01-21 MED ORDER — CLOPIDOGREL BISULFATE 75 MG PO TABS: 75.0000 mg | ORAL_TABLET | Freq: Every day | ORAL | 0 refills | Status: DC

## 2018-01-21 MED ORDER — ASPIRIN 81 MG PO TBEC: 81.0000 mg | DELAYED_RELEASE_TABLET | Freq: Every day | ORAL | Status: DC

## 2018-01-21 MED ORDER — POLYETHYLENE GLYCOL 3350 17 G PO PACK: 17.0000 g | PACK | Freq: Every day | ORAL | 0 refills | Status: DC

## 2018-01-21 MED ORDER — DOCUSATE SODIUM 100 MG PO CAPS: 100.0000 mg | ORAL_CAPSULE | Freq: Two times a day (BID) | ORAL | 0 refills | Status: DC

## 2018-01-21 MED ORDER — METHOCARBAMOL 500 MG PO TABS: 500.0000 mg | ORAL_TABLET | Freq: Four times a day (QID) | ORAL | 0 refills | Status: DC | PRN
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
## 2018-01-21 NOTE — Progress Notes (Signed)
RN attempted to call report to [facility] [phone]. RN placed on hold, called forwarded to voicemail.
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
## 2018-01-21 NOTE — Clinical Social Work Note (Signed)
Clinical Social Worker facilitated patient discharge including contacting patient family and facility to confirm patient discharge plans.  Clinical information faxed to facility and family agreeable with plan.  CSW arranged ambulance transport via [facility] to [facility].  RN to call report prior to discharge [phone]).  Clinical Social Worker will sign off for now as social work intervention is no longer needed. Please consult [place] again if new need arises.  Barbette Or, [place]
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
## 2018-01-21 NOTE — Progress Notes (Signed)
RN attempted to contact staff at [facility] to give report. RN placed on hold a second time prior to call being forwarded to voicemail.

## 2018-01-21 NOTE — Clinical Social Work Placement (Signed)
   CLINICAL SOCIAL WORK PLACEMENT  NOTE  Date:  01/21/2018  Patient Details  Name: Taylor Ochoa MRN: [id number] Date of Birth: 04/15/1956  Clinical Social Work is seeking post-discharge placement for this patient at the [place] level of care (*CSW will initial, date and re-position this form in  chart as items are completed):  Yes   Patient/family provided with [place] Work Department's list of facilities offering this level of care within the geographic area requested by the patient (or if unable, by the patient's family).  Yes   Patient/family informed of their freedom to choose among providers that offer the needed level of care, that participate in Medicare, Medicaid or managed care program needed by the patient, have an available bed and are willing to accept the patient.  Yes   Patient/family informed of [facility]'s ownership interest in [facility] and [facility], as well as of the fact that they are under no obligation to receive care at these facilities.  PASRR submitted to EDS on       PASRR number received on       Existing PASRR number confirmed on 01/18/18     FL2 transmitted to all facilities in geographic area requested by pt/family on 01/18/18     FL2 transmitted to all facilities within larger geographic area on       Patient informed that his/her managed care company has contracts with or will negotiate with certain facilities, including the following:        Yes   Patient/family informed of bed offers received.  Patient chooses bed at [facility]     Physician recommends and patient chooses bed at [facility]    Patient to be transferred to [facility] on 01/21/18.  Patient to be transferred to facility by Ambulance     Patient family notified on 01/21/18 of transfer.  Name of family member notified:  Patient spouse at bedside     PHYSICIAN Please prepare priority discharge summary, including  medications     Additional Comment:   Barbette Or, [place]

## 2018-01-22 ENCOUNTER — Other Ambulatory Visit: Payer: Self-pay | Admitting: *Deleted

## 2018-01-22 DIAGNOSIS — S32301D Unspecified fracture of right ilium, subsequent encounter for fracture with routine healing: Secondary | ICD-10-CM | POA: Diagnosis not present

## 2018-01-22 DIAGNOSIS — S32401D Unspecified fracture of right acetabulum, subsequent encounter for fracture with routine healing: Secondary | ICD-10-CM | POA: Diagnosis not present

## 2018-01-22 DIAGNOSIS — I8290 Acute embolism and thrombosis of unspecified vein: Secondary | ICD-10-CM | POA: Diagnosis not present
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
## 2018-01-23 ENCOUNTER — Encounter: Payer: Self-pay | Admitting: Student

## 2018-01-23 ENCOUNTER — Encounter: Payer: Self-pay | Admitting: Internal Medicine

## 2018-01-23 DIAGNOSIS — S329XXA Fracture of unspecified parts of lumbosacral spine and pelvis, initial encounter for closed fracture: Secondary | ICD-10-CM | POA: Diagnosis not present

## 2018-01-23 DIAGNOSIS — S32435A Nondisplaced fracture of anterior column [iliopubic] of left acetabulum, initial encounter for closed fracture: Secondary | ICD-10-CM | POA: Insufficient documentation

## 2018-01-23 DIAGNOSIS — S32402S Unspecified fracture of left acetabulum, sequela: Secondary | ICD-10-CM | POA: Diagnosis not present

## 2018-01-23 DIAGNOSIS — R52 Pain, unspecified: Secondary | ICD-10-CM | POA: Diagnosis not present

## 2018-01-23 DIAGNOSIS — R2689 Other abnormalities of gait and mobility: Secondary | ICD-10-CM | POA: Diagnosis not present

## 2018-01-26 DIAGNOSIS — R82998 Other abnormal findings in urine: Secondary | ICD-10-CM | POA: Diagnosis not present

## 2018-01-26 DIAGNOSIS — R945 Abnormal results of liver function studies: Secondary | ICD-10-CM | POA: Diagnosis not present

## 2018-01-27 DIAGNOSIS — R52 Pain, unspecified: Secondary | ICD-10-CM | POA: Diagnosis not present

## 2018-01-27 DIAGNOSIS — R2689 Other abnormalities of gait and mobility: Secondary | ICD-10-CM | POA: Diagnosis not present

## 2018-01-27 DIAGNOSIS — S329XXA Fracture of unspecified parts of lumbosacral spine and pelvis, initial encounter for closed fracture: Secondary | ICD-10-CM | POA: Diagnosis not present

## 2018-01-27 DIAGNOSIS — S32402S Unspecified fracture of left acetabulum, sequela: Secondary | ICD-10-CM | POA: Diagnosis not present
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
## 2018-01-29 ENCOUNTER — Other Ambulatory Visit: Payer: Self-pay
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
## 2018-02-02 DIAGNOSIS — S329XXA Fracture of unspecified parts of lumbosacral spine and pelvis, initial encounter for closed fracture: Secondary | ICD-10-CM | POA: Diagnosis not present

## 2018-02-02 DIAGNOSIS — R600 Localized edema: Secondary | ICD-10-CM | POA: Diagnosis not present

## 2018-02-02 DIAGNOSIS — S32402S Unspecified fracture of left acetabulum, sequela: Secondary | ICD-10-CM | POA: Diagnosis not present

## 2018-02-02 DIAGNOSIS — R52 Pain, unspecified: Secondary | ICD-10-CM | POA: Diagnosis not present

## 2018-02-02 DIAGNOSIS — R2689 Other abnormalities of gait and mobility: Secondary | ICD-10-CM | POA: Diagnosis not present

## 2018-02-03 DIAGNOSIS — S32810D Multiple fractures of pelvis with stable disruption of pelvic ring, subsequent encounter for fracture with routine healing: Secondary | ICD-10-CM | POA: Diagnosis not present

## 2018-02-03 DIAGNOSIS — W57XXXA Bitten or stung by nonvenomous insect and other nonvenomous arthropods, initial encounter: Secondary | ICD-10-CM | POA: Diagnosis not present

## 2018-02-03 DIAGNOSIS — L559 Sunburn, unspecified: Secondary | ICD-10-CM | POA: Diagnosis not present

## 2018-02-04 ENCOUNTER — Ambulatory Visit: Payer: 59 | Admitting: Vascular Surgery

## 2018-02-04 ENCOUNTER — Other Ambulatory Visit: Payer: Self-pay
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
## 2018-02-05 ENCOUNTER — Ambulatory Visit (INDEPENDENT_AMBULATORY_CARE_PROVIDER_SITE_OTHER): Payer: Self-pay | Admitting: Physician Assistant

## 2018-02-05 ENCOUNTER — Other Ambulatory Visit: Payer: Self-pay

## 2018-02-05 ENCOUNTER — Other Ambulatory Visit: Payer: Self-pay | Admitting: *Deleted

## 2018-02-05 VITALS — BP 118/79 | HR 92 | Temp 100.4°F | Resp 16 | Ht 69.0 in | Wt 155.0 lb

## 2018-02-05 DIAGNOSIS — I745 Embolism and thrombosis of iliac artery: Secondary | ICD-10-CM

## 2018-02-05 DIAGNOSIS — S329XXA Fracture of unspecified parts of lumbosacral spine and pelvis, initial encounter for closed fracture: Secondary | ICD-10-CM | POA: Diagnosis not present

## 2018-02-05 DIAGNOSIS — S32402S Unspecified fracture of left acetabulum, sequela: Secondary | ICD-10-CM | POA: Diagnosis not present

## 2018-02-05 DIAGNOSIS — R52 Pain, unspecified: Secondary | ICD-10-CM | POA: Diagnosis not present

## 2018-02-05 DIAGNOSIS — R2689 Other abnormalities of gait and mobility: Secondary | ICD-10-CM | POA: Diagnosis not present
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
## 2018-02-06 DIAGNOSIS — W57XXXA Bitten or stung by nonvenomous insect and other nonvenomous arthropods, initial encounter: Secondary | ICD-10-CM | POA: Diagnosis not present

## 2018-02-06 DIAGNOSIS — L559 Sunburn, unspecified: Secondary | ICD-10-CM | POA: Diagnosis not present

## 2018-02-09 DIAGNOSIS — M25551 Pain in right hip: Secondary | ICD-10-CM | POA: Diagnosis not present

## 2018-02-09 DIAGNOSIS — R2689 Other abnormalities of gait and mobility: Secondary | ICD-10-CM | POA: Diagnosis not present

## 2018-02-09 DIAGNOSIS — S32402S Unspecified fracture of left acetabulum, sequela: Secondary | ICD-10-CM | POA: Diagnosis not present

## 2018-02-09 DIAGNOSIS — S329XXA Fracture of unspecified parts of lumbosacral spine and pelvis, initial encounter for closed fracture: Secondary | ICD-10-CM | POA: Diagnosis not present

## 2018-02-13 DIAGNOSIS — M25551 Pain in right hip: Secondary | ICD-10-CM | POA: Diagnosis not present

## 2018-02-13 DIAGNOSIS — S32402S Unspecified fracture of left acetabulum, sequela: Secondary | ICD-10-CM | POA: Diagnosis not present

## 2018-02-13 DIAGNOSIS — R2689 Other abnormalities of gait and mobility: Secondary | ICD-10-CM | POA: Diagnosis not present

## 2018-02-13 DIAGNOSIS — S329XXA Fracture of unspecified parts of lumbosacral spine and pelvis, initial encounter for closed fracture: Secondary | ICD-10-CM | POA: Diagnosis not present

## 2018-02-17 DIAGNOSIS — S32401D Unspecified fracture of right acetabulum, subsequent encounter for fracture with routine healing: Secondary | ICD-10-CM | POA: Diagnosis not present

## 2018-02-17 DIAGNOSIS — I8290 Acute embolism and thrombosis of unspecified vein: Secondary | ICD-10-CM | POA: Diagnosis not present

## 2018-02-17 DIAGNOSIS — R945 Abnormal results of liver function studies: Secondary | ICD-10-CM | POA: Diagnosis not present

## 2018-02-17 DIAGNOSIS — S32301D Unspecified fracture of right ilium, subsequent encounter for fracture with routine healing: Secondary | ICD-10-CM | POA: Diagnosis not present
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
## 2018-02-18 ENCOUNTER — Ambulatory Visit (INDEPENDENT_AMBULATORY_CARE_PROVIDER_SITE_OTHER): Payer: 59 | Admitting: Vascular Surgery

## 2018-02-18 ENCOUNTER — Encounter: Payer: Self-pay | Admitting: Vascular Surgery

## 2018-02-18 DIAGNOSIS — I7772 Dissection of iliac artery: Secondary | ICD-10-CM

## 2018-02-18 MED FILL — METHOCARBAMOL 500 MG TABS: 500 | 30 days supply | Qty: 120 | Fill #0

## 2018-02-18 MED FILL — GABAPENTIN 300 MG CAPSULE: 300 | 30 days supply | Qty: 90 | Fill #0

## 2018-02-18 MED FILL — CLOPIDOGREL 75 MG TABLET: 75 | 30 days supply | Qty: 30 | Fill #0

## 2018-02-18 MED FILL — PANTOPRAZOLE SOD DR 40 MG T: 40 | 30 days supply | Qty: 30 | Fill #0

## 2018-02-18 NOTE — Addendum Note (Signed)
Addended by: Oretha Caprice on: 02/18/2018 03:36 PM   Modules accepted: Orders

## 2018-02-19 MED FILL — oxyCODONE HCL 5 MG TABS: 5 | 5 days supply | Qty: 45 | Fill #0

## 2018-02-26 DIAGNOSIS — I745 Embolism and thrombosis of iliac artery: Secondary | ICD-10-CM | POA: Diagnosis not present

## 2018-02-26 DIAGNOSIS — Z7982 Long term (current) use of aspirin: Secondary | ICD-10-CM | POA: Diagnosis not present

## 2018-02-26 DIAGNOSIS — Z7902 Long term (current) use of antithrombotics/antiplatelets: Secondary | ICD-10-CM | POA: Diagnosis not present

## 2018-02-26 DIAGNOSIS — S32301D Unspecified fracture of right ilium, subsequent encounter for fracture with routine healing: Secondary | ICD-10-CM | POA: Diagnosis not present

## 2018-02-26 DIAGNOSIS — D500 Iron deficiency anemia secondary to blood loss (chronic): Secondary | ICD-10-CM | POA: Diagnosis not present

## 2018-02-26 DIAGNOSIS — S329XXD Fracture of unspecified parts of lumbosacral spine and pelvis, subsequent encounter for fracture with routine healing: Secondary | ICD-10-CM | POA: Diagnosis not present

## 2018-02-26 DIAGNOSIS — S32401D Unspecified fracture of right acetabulum, subsequent encounter for fracture with routine healing: Secondary | ICD-10-CM | POA: Diagnosis not present

## 2018-02-27 DIAGNOSIS — Z7982 Long term (current) use of aspirin: Secondary | ICD-10-CM | POA: Diagnosis not present

## 2018-02-27 DIAGNOSIS — I745 Embolism and thrombosis of iliac artery: Secondary | ICD-10-CM | POA: Diagnosis not present

## 2018-02-27 DIAGNOSIS — S32401D Unspecified fracture of right acetabulum, subsequent encounter for fracture with routine healing: Secondary | ICD-10-CM | POA: Diagnosis not present

## 2018-02-27 DIAGNOSIS — S329XXD Fracture of unspecified parts of lumbosacral spine and pelvis, subsequent encounter for fracture with routine healing: Secondary | ICD-10-CM | POA: Diagnosis not present

## 2018-02-27 DIAGNOSIS — D500 Iron deficiency anemia secondary to blood loss (chronic): Secondary | ICD-10-CM | POA: Diagnosis not present

## 2018-02-27 DIAGNOSIS — S32301D Unspecified fracture of right ilium, subsequent encounter for fracture with routine healing: Secondary | ICD-10-CM | POA: Diagnosis not present

## 2018-02-27 DIAGNOSIS — Z7902 Long term (current) use of antithrombotics/antiplatelets: Secondary | ICD-10-CM | POA: Diagnosis not present

## 2018-03-02 DIAGNOSIS — S329XXD Fracture of unspecified parts of lumbosacral spine and pelvis, subsequent encounter for fracture with routine healing: Secondary | ICD-10-CM | POA: Diagnosis not present

## 2018-03-02 DIAGNOSIS — Z7902 Long term (current) use of antithrombotics/antiplatelets: Secondary | ICD-10-CM | POA: Diagnosis not present

## 2018-03-02 DIAGNOSIS — D500 Iron deficiency anemia secondary to blood loss (chronic): Secondary | ICD-10-CM | POA: Diagnosis not present

## 2018-03-02 DIAGNOSIS — S32301D Unspecified fracture of right ilium, subsequent encounter for fracture with routine healing: Secondary | ICD-10-CM | POA: Diagnosis not present

## 2018-03-02 DIAGNOSIS — S32401D Unspecified fracture of right acetabulum, subsequent encounter for fracture with routine healing: Secondary | ICD-10-CM | POA: Diagnosis not present

## 2018-03-02 DIAGNOSIS — I745 Embolism and thrombosis of iliac artery: Secondary | ICD-10-CM | POA: Diagnosis not present

## 2018-03-02 DIAGNOSIS — Z7982 Long term (current) use of aspirin: Secondary | ICD-10-CM | POA: Diagnosis not present

## 2018-03-03 DIAGNOSIS — S32810D Multiple fractures of pelvis with stable disruption of pelvic ring, subsequent encounter for fracture with routine healing: Secondary | ICD-10-CM | POA: Diagnosis not present

## 2018-03-05 DIAGNOSIS — I745 Embolism and thrombosis of iliac artery: Secondary | ICD-10-CM | POA: Diagnosis not present

## 2018-03-05 DIAGNOSIS — S32301D Unspecified fracture of right ilium, subsequent encounter for fracture with routine healing: Secondary | ICD-10-CM | POA: Diagnosis not present

## 2018-03-05 DIAGNOSIS — Z7982 Long term (current) use of aspirin: Secondary | ICD-10-CM | POA: Diagnosis not present

## 2018-03-05 DIAGNOSIS — S329XXD Fracture of unspecified parts of lumbosacral spine and pelvis, subsequent encounter for fracture with routine healing: Secondary | ICD-10-CM | POA: Diagnosis not present

## 2018-03-05 DIAGNOSIS — D500 Iron deficiency anemia secondary to blood loss (chronic): Secondary | ICD-10-CM | POA: Diagnosis not present

## 2018-03-05 DIAGNOSIS — S32401D Unspecified fracture of right acetabulum, subsequent encounter for fracture with routine healing: Secondary | ICD-10-CM | POA: Diagnosis not present

## 2018-03-05 DIAGNOSIS — Z7902 Long term (current) use of antithrombotics/antiplatelets: Secondary | ICD-10-CM | POA: Diagnosis not present

## 2018-03-06 DIAGNOSIS — I745 Embolism and thrombosis of iliac artery: Secondary | ICD-10-CM | POA: Diagnosis not present

## 2018-03-06 DIAGNOSIS — S329XXD Fracture of unspecified parts of lumbosacral spine and pelvis, subsequent encounter for fracture with routine healing: Secondary | ICD-10-CM | POA: Diagnosis not present

## 2018-03-06 DIAGNOSIS — S32301D Unspecified fracture of right ilium, subsequent encounter for fracture with routine healing: Secondary | ICD-10-CM | POA: Diagnosis not present

## 2018-03-06 DIAGNOSIS — Z7982 Long term (current) use of aspirin: Secondary | ICD-10-CM | POA: Diagnosis not present

## 2018-03-06 DIAGNOSIS — S32401D Unspecified fracture of right acetabulum, subsequent encounter for fracture with routine healing: Secondary | ICD-10-CM | POA: Diagnosis not present

## 2018-03-06 DIAGNOSIS — D500 Iron deficiency anemia secondary to blood loss (chronic): Secondary | ICD-10-CM | POA: Diagnosis not present

## 2018-03-06 DIAGNOSIS — Z7902 Long term (current) use of antithrombotics/antiplatelets: Secondary | ICD-10-CM | POA: Diagnosis not present

## 2018-03-10 DIAGNOSIS — D500 Iron deficiency anemia secondary to blood loss (chronic): Secondary | ICD-10-CM | POA: Diagnosis not present

## 2018-03-10 DIAGNOSIS — S32301D Unspecified fracture of right ilium, subsequent encounter for fracture with routine healing: Secondary | ICD-10-CM | POA: Diagnosis not present

## 2018-03-10 DIAGNOSIS — Z7902 Long term (current) use of antithrombotics/antiplatelets: Secondary | ICD-10-CM | POA: Diagnosis not present

## 2018-03-10 DIAGNOSIS — S329XXD Fracture of unspecified parts of lumbosacral spine and pelvis, subsequent encounter for fracture with routine healing: Secondary | ICD-10-CM | POA: Diagnosis not present

## 2018-03-10 DIAGNOSIS — Z7982 Long term (current) use of aspirin: Secondary | ICD-10-CM | POA: Diagnosis not present

## 2018-03-10 DIAGNOSIS — I745 Embolism and thrombosis of iliac artery: Secondary | ICD-10-CM | POA: Diagnosis not present

## 2018-03-10 DIAGNOSIS — S32401D Unspecified fracture of right acetabulum, subsequent encounter for fracture with routine healing: Secondary | ICD-10-CM | POA: Diagnosis not present

## 2018-03-10 MED FILL — traMADol HCL 50 MG TABS: 50 | 6 days supply | Qty: 25 | Fill #0

## 2018-03-12 ENCOUNTER — Other Ambulatory Visit: Payer: Self-pay

## 2018-03-12 ENCOUNTER — Ambulatory Visit (INDEPENDENT_AMBULATORY_CARE_PROVIDER_SITE_OTHER): Payer: 59 | Admitting: Internal Medicine

## 2018-03-12 ENCOUNTER — Telehealth: Payer: Self-pay | Admitting: *Deleted

## 2018-03-12 VITALS — BP 126/74 | HR 85 | Temp 98.6°F | Ht 69.0 in | Wt 159.0 lb

## 2018-03-12 DIAGNOSIS — I772 Rupture of artery: Secondary | ICD-10-CM | POA: Diagnosis not present

## 2018-03-12 DIAGNOSIS — K219 Gastro-esophageal reflux disease without esophagitis: Secondary | ICD-10-CM

## 2018-03-12 DIAGNOSIS — D62 Acute posthemorrhagic anemia: Secondary | ICD-10-CM

## 2018-03-12 DIAGNOSIS — S32301D Unspecified fracture of right ilium, subsequent encounter for fracture with routine healing: Secondary | ICD-10-CM | POA: Diagnosis not present

## 2018-03-12 DIAGNOSIS — I745 Embolism and thrombosis of iliac artery: Secondary | ICD-10-CM | POA: Diagnosis not present

## 2018-03-12 DIAGNOSIS — Z7902 Long term (current) use of antithrombotics/antiplatelets: Secondary | ICD-10-CM

## 2018-03-12 DIAGNOSIS — F17210 Nicotine dependence, cigarettes, uncomplicated: Secondary | ICD-10-CM

## 2018-03-12 DIAGNOSIS — D500 Iron deficiency anemia secondary to blood loss (chronic): Secondary | ICD-10-CM | POA: Diagnosis not present

## 2018-03-12 DIAGNOSIS — S32810D Multiple fractures of pelvis with stable disruption of pelvic ring, subsequent encounter for fracture with routine healing: Secondary | ICD-10-CM | POA: Diagnosis not present

## 2018-03-12 DIAGNOSIS — Z79899 Other long term (current) drug therapy: Secondary | ICD-10-CM | POA: Diagnosis not present

## 2018-03-12 DIAGNOSIS — Z7982 Long term (current) use of aspirin: Secondary | ICD-10-CM | POA: Diagnosis not present

## 2018-03-12 DIAGNOSIS — S329XXD Fracture of unspecified parts of lumbosacral spine and pelvis, subsequent encounter for fracture with routine healing: Secondary | ICD-10-CM | POA: Diagnosis not present

## 2018-03-12 DIAGNOSIS — S32401D Unspecified fracture of right acetabulum, subsequent encounter for fracture with routine healing: Secondary | ICD-10-CM | POA: Diagnosis not present

## 2018-03-12 DIAGNOSIS — Z720 Tobacco use: Secondary | ICD-10-CM

## 2018-03-12 DIAGNOSIS — I7772 Dissection of iliac artery: Secondary | ICD-10-CM

## 2018-03-12 MED ORDER — FERROUS SULFATE 325 (65 FE) MG PO TBEC: 325.0000 mg | DELAYED_RELEASE_TABLET | Freq: Every day | ORAL | 0 refills | Status: DC

## 2018-03-12 MED ORDER — PANTOPRAZOLE SODIUM 40 MG PO TBEC: 40.0000 mg | DELAYED_RELEASE_TABLET | Freq: Every day | ORAL | 0 refills | Status: DC

## 2018-03-12 MED ORDER — ASPIRIN 81 MG PO TBEC: 81.0000 mg | DELAYED_RELEASE_TABLET | Freq: Every day | ORAL | Status: AC

## 2018-03-12 MED ORDER — DOCUSATE SODIUM 100 MG PO CAPS: 100.0000 mg | ORAL_CAPSULE | Freq: Two times a day (BID) | ORAL | 0 refills | Status: DC | PRN

## 2018-03-12 MED ORDER — CLOPIDOGREL BISULFATE 75 MG PO TABS: 75.0000 mg | ORAL_TABLET | Freq: Every day | ORAL | 0 refills | Status: DC

## 2018-03-12 MED ORDER — GABAPENTIN 300 MG PO CAPS: 300.0000 mg | ORAL_CAPSULE | Freq: Three times a day (TID) | ORAL | 1 refills | Status: DC

## 2018-03-12 NOTE — Assessment & Plan Note (Signed)
Improved with switch from esomeprazole to pantoprazole - Refilled pantoprazole 40mg  daily
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
## 2018-03-12 NOTE — Assessment & Plan Note (Addendum)
Assessment Normocytic anemia noted on discharge (with Hb 8.9). Baseline Hb is normal. Patient denies active bleeding, although notes easy bruising while on DAPT. Will start oral iron supplementation and recheck Hb at next visit to ensure improvement.  Plan - PO ferrous sulfate 325mg  once daily - F/u in 3 months - Check CBC at next visit

## 2018-03-12 NOTE — Assessment & Plan Note (Signed)
Assessment Continues to smoke ~0.5ppd. Patient is working on decreasing daily cigarette use.  Plan - Counseled

## 2018-03-12 NOTE — Progress Notes (Signed)
Medicine attending: Medical history, presenting problems, physical findings, and medications, reviewed with resident physician Dr Jennifer Huang on the day of the patient visit and I concur with her evaluation and management plan. 
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
## 2018-03-13 DIAGNOSIS — S32301D Unspecified fracture of right ilium, subsequent encounter for fracture with routine healing: Secondary | ICD-10-CM | POA: Diagnosis not present

## 2018-03-13 DIAGNOSIS — S32401D Unspecified fracture of right acetabulum, subsequent encounter for fracture with routine healing: Secondary | ICD-10-CM | POA: Diagnosis not present

## 2018-03-13 DIAGNOSIS — I745 Embolism and thrombosis of iliac artery: Secondary | ICD-10-CM | POA: Diagnosis not present

## 2018-03-13 DIAGNOSIS — Z7902 Long term (current) use of antithrombotics/antiplatelets: Secondary | ICD-10-CM | POA: Diagnosis not present

## 2018-03-13 DIAGNOSIS — S329XXD Fracture of unspecified parts of lumbosacral spine and pelvis, subsequent encounter for fracture with routine healing: Secondary | ICD-10-CM | POA: Diagnosis not present

## 2018-03-13 DIAGNOSIS — D500 Iron deficiency anemia secondary to blood loss (chronic): Secondary | ICD-10-CM | POA: Diagnosis not present

## 2018-03-13 DIAGNOSIS — Z7982 Long term (current) use of aspirin: Secondary | ICD-10-CM | POA: Diagnosis not present

## 2018-03-15 ENCOUNTER — Encounter: Payer: Self-pay | Admitting: *Deleted

## 2018-03-16 DIAGNOSIS — D500 Iron deficiency anemia secondary to blood loss (chronic): Secondary | ICD-10-CM | POA: Diagnosis not present

## 2018-03-16 DIAGNOSIS — S32301D Unspecified fracture of right ilium, subsequent encounter for fracture with routine healing: Secondary | ICD-10-CM | POA: Diagnosis not present

## 2018-03-16 DIAGNOSIS — S329XXD Fracture of unspecified parts of lumbosacral spine and pelvis, subsequent encounter for fracture with routine healing: Secondary | ICD-10-CM | POA: Diagnosis not present

## 2018-03-16 DIAGNOSIS — Z7982 Long term (current) use of aspirin: Secondary | ICD-10-CM | POA: Diagnosis not present

## 2018-03-16 DIAGNOSIS — S32401D Unspecified fracture of right acetabulum, subsequent encounter for fracture with routine healing: Secondary | ICD-10-CM | POA: Diagnosis not present

## 2018-03-16 DIAGNOSIS — Z7902 Long term (current) use of antithrombotics/antiplatelets: Secondary | ICD-10-CM | POA: Diagnosis not present

## 2018-03-16 DIAGNOSIS — I745 Embolism and thrombosis of iliac artery: Secondary | ICD-10-CM | POA: Diagnosis not present

## 2018-03-18 DIAGNOSIS — D500 Iron deficiency anemia secondary to blood loss (chronic): Secondary | ICD-10-CM | POA: Diagnosis not present

## 2018-03-18 DIAGNOSIS — Z7902 Long term (current) use of antithrombotics/antiplatelets: Secondary | ICD-10-CM | POA: Diagnosis not present

## 2018-03-18 DIAGNOSIS — S32301D Unspecified fracture of right ilium, subsequent encounter for fracture with routine healing: Secondary | ICD-10-CM | POA: Diagnosis not present

## 2018-03-18 DIAGNOSIS — Z7982 Long term (current) use of aspirin: Secondary | ICD-10-CM | POA: Diagnosis not present

## 2018-03-18 DIAGNOSIS — I745 Embolism and thrombosis of iliac artery: Secondary | ICD-10-CM | POA: Diagnosis not present

## 2018-03-18 DIAGNOSIS — S329XXD Fracture of unspecified parts of lumbosacral spine and pelvis, subsequent encounter for fracture with routine healing: Secondary | ICD-10-CM | POA: Diagnosis not present

## 2018-03-18 DIAGNOSIS — S32401D Unspecified fracture of right acetabulum, subsequent encounter for fracture with routine healing: Secondary | ICD-10-CM | POA: Diagnosis not present

## 2018-03-18 MED FILL — PANTOPRAZOLE SOD DR 40 MG T: 40 | 90 days supply | Qty: 90 | Fill #0

## 2018-03-18 MED FILL — GABAPENTIN 300 MG CAPSULE: 300 | 90 days supply | Qty: 270 | Fill #0

## 2018-03-18 MED FILL — CLOPIDOGREL 75 MG TABLET: 75 | 60 days supply | Qty: 60 | Fill #0

## 2018-03-19 DIAGNOSIS — S329XXD Fracture of unspecified parts of lumbosacral spine and pelvis, subsequent encounter for fracture with routine healing: Secondary | ICD-10-CM | POA: Diagnosis not present

## 2018-03-19 DIAGNOSIS — Z7902 Long term (current) use of antithrombotics/antiplatelets: Secondary | ICD-10-CM | POA: Diagnosis not present

## 2018-03-19 DIAGNOSIS — I745 Embolism and thrombosis of iliac artery: Secondary | ICD-10-CM | POA: Diagnosis not present

## 2018-03-19 DIAGNOSIS — D500 Iron deficiency anemia secondary to blood loss (chronic): Secondary | ICD-10-CM | POA: Diagnosis not present

## 2018-03-19 DIAGNOSIS — S32301D Unspecified fracture of right ilium, subsequent encounter for fracture with routine healing: Secondary | ICD-10-CM | POA: Diagnosis not present

## 2018-03-19 DIAGNOSIS — Z7982 Long term (current) use of aspirin: Secondary | ICD-10-CM | POA: Diagnosis not present

## 2018-03-19 DIAGNOSIS — S32401D Unspecified fracture of right acetabulum, subsequent encounter for fracture with routine healing: Secondary | ICD-10-CM | POA: Diagnosis not present

## 2018-03-20 DIAGNOSIS — S37032A Laceration of left kidney, unspecified degree, initial encounter: Secondary | ICD-10-CM | POA: Diagnosis not present

## 2018-03-20 DIAGNOSIS — Z4789 Encounter for other orthopedic aftercare: Secondary | ICD-10-CM | POA: Diagnosis not present

## 2018-03-20 DIAGNOSIS — S329XXA Fracture of unspecified parts of lumbosacral spine and pelvis, initial encounter for closed fracture: Secondary | ICD-10-CM | POA: Diagnosis not present

## 2018-03-20 DIAGNOSIS — G8911 Acute pain due to trauma: Secondary | ICD-10-CM | POA: Diagnosis not present

## 2018-03-24 DIAGNOSIS — D500 Iron deficiency anemia secondary to blood loss (chronic): Secondary | ICD-10-CM | POA: Diagnosis not present

## 2018-03-24 DIAGNOSIS — I745 Embolism and thrombosis of iliac artery: Secondary | ICD-10-CM | POA: Diagnosis not present

## 2018-03-24 DIAGNOSIS — S32301D Unspecified fracture of right ilium, subsequent encounter for fracture with routine healing: Secondary | ICD-10-CM | POA: Diagnosis not present

## 2018-03-24 DIAGNOSIS — Z7902 Long term (current) use of antithrombotics/antiplatelets: Secondary | ICD-10-CM | POA: Diagnosis not present

## 2018-03-24 DIAGNOSIS — S32401D Unspecified fracture of right acetabulum, subsequent encounter for fracture with routine healing: Secondary | ICD-10-CM | POA: Diagnosis not present

## 2018-03-24 DIAGNOSIS — Z7982 Long term (current) use of aspirin: Secondary | ICD-10-CM | POA: Diagnosis not present

## 2018-03-24 DIAGNOSIS — S329XXD Fracture of unspecified parts of lumbosacral spine and pelvis, subsequent encounter for fracture with routine healing: Secondary | ICD-10-CM | POA: Diagnosis not present

## 2018-03-25 DIAGNOSIS — S32301D Unspecified fracture of right ilium, subsequent encounter for fracture with routine healing: Secondary | ICD-10-CM | POA: Diagnosis not present

## 2018-03-25 DIAGNOSIS — S32401D Unspecified fracture of right acetabulum, subsequent encounter for fracture with routine healing: Secondary | ICD-10-CM | POA: Diagnosis not present

## 2018-03-25 DIAGNOSIS — Z7982 Long term (current) use of aspirin: Secondary | ICD-10-CM | POA: Diagnosis not present

## 2018-03-25 DIAGNOSIS — Z7902 Long term (current) use of antithrombotics/antiplatelets: Secondary | ICD-10-CM | POA: Diagnosis not present

## 2018-03-25 DIAGNOSIS — I745 Embolism and thrombosis of iliac artery: Secondary | ICD-10-CM | POA: Diagnosis not present

## 2018-03-25 DIAGNOSIS — D500 Iron deficiency anemia secondary to blood loss (chronic): Secondary | ICD-10-CM | POA: Diagnosis not present

## 2018-03-25 DIAGNOSIS — S329XXD Fracture of unspecified parts of lumbosacral spine and pelvis, subsequent encounter for fracture with routine healing: Secondary | ICD-10-CM | POA: Diagnosis not present

## 2018-03-31 DIAGNOSIS — S32810D Multiple fractures of pelvis with stable disruption of pelvic ring, subsequent encounter for fracture with routine healing: Secondary | ICD-10-CM | POA: Diagnosis not present

## 2018-04-02 DIAGNOSIS — Z7982 Long term (current) use of aspirin: Secondary | ICD-10-CM | POA: Diagnosis not present

## 2018-04-02 DIAGNOSIS — D500 Iron deficiency anemia secondary to blood loss (chronic): Secondary | ICD-10-CM | POA: Diagnosis not present

## 2018-04-02 DIAGNOSIS — S32401D Unspecified fracture of right acetabulum, subsequent encounter for fracture with routine healing: Secondary | ICD-10-CM | POA: Diagnosis not present

## 2018-04-02 DIAGNOSIS — S32301D Unspecified fracture of right ilium, subsequent encounter for fracture with routine healing: Secondary | ICD-10-CM | POA: Diagnosis not present

## 2018-04-02 DIAGNOSIS — I745 Embolism and thrombosis of iliac artery: Secondary | ICD-10-CM | POA: Diagnosis not present

## 2018-04-02 DIAGNOSIS — Z7902 Long term (current) use of antithrombotics/antiplatelets: Secondary | ICD-10-CM | POA: Diagnosis not present

## 2018-04-02 DIAGNOSIS — S329XXD Fracture of unspecified parts of lumbosacral spine and pelvis, subsequent encounter for fracture with routine healing: Secondary | ICD-10-CM | POA: Diagnosis not present

## 2018-04-07 DIAGNOSIS — Z7902 Long term (current) use of antithrombotics/antiplatelets: Secondary | ICD-10-CM | POA: Diagnosis not present

## 2018-04-07 DIAGNOSIS — S329XXD Fracture of unspecified parts of lumbosacral spine and pelvis, subsequent encounter for fracture with routine healing: Secondary | ICD-10-CM | POA: Diagnosis not present

## 2018-04-07 DIAGNOSIS — S32301D Unspecified fracture of right ilium, subsequent encounter for fracture with routine healing: Secondary | ICD-10-CM | POA: Diagnosis not present

## 2018-04-07 DIAGNOSIS — S32401D Unspecified fracture of right acetabulum, subsequent encounter for fracture with routine healing: Secondary | ICD-10-CM | POA: Diagnosis not present

## 2018-04-07 DIAGNOSIS — D500 Iron deficiency anemia secondary to blood loss (chronic): Secondary | ICD-10-CM | POA: Diagnosis not present

## 2018-04-07 DIAGNOSIS — I745 Embolism and thrombosis of iliac artery: Secondary | ICD-10-CM | POA: Diagnosis not present

## 2018-04-07 DIAGNOSIS — Z7982 Long term (current) use of aspirin: Secondary | ICD-10-CM | POA: Diagnosis not present

## 2018-04-09 DIAGNOSIS — S32401D Unspecified fracture of right acetabulum, subsequent encounter for fracture with routine healing: Secondary | ICD-10-CM | POA: Diagnosis not present

## 2018-04-09 DIAGNOSIS — Z7982 Long term (current) use of aspirin: Secondary | ICD-10-CM | POA: Diagnosis not present

## 2018-04-09 DIAGNOSIS — S32301D Unspecified fracture of right ilium, subsequent encounter for fracture with routine healing: Secondary | ICD-10-CM | POA: Diagnosis not present

## 2018-04-09 DIAGNOSIS — Z7902 Long term (current) use of antithrombotics/antiplatelets: Secondary | ICD-10-CM | POA: Diagnosis not present

## 2018-04-09 DIAGNOSIS — I745 Embolism and thrombosis of iliac artery: Secondary | ICD-10-CM | POA: Diagnosis not present

## 2018-04-09 DIAGNOSIS — D500 Iron deficiency anemia secondary to blood loss (chronic): Secondary | ICD-10-CM | POA: Diagnosis not present

## 2018-04-09 DIAGNOSIS — S329XXD Fracture of unspecified parts of lumbosacral spine and pelvis, subsequent encounter for fracture with routine healing: Secondary | ICD-10-CM | POA: Diagnosis not present

## 2018-04-13 ENCOUNTER — Other Ambulatory Visit: Payer: Self-pay

## 2018-04-13 DIAGNOSIS — I739 Peripheral vascular disease, unspecified: Secondary | ICD-10-CM

## 2018-04-14 MED FILL — traMADol HCL 50 MG TABS: 50 | 6 days supply | Qty: 25 | Fill #1

## 2018-04-19 DIAGNOSIS — Z4789 Encounter for other orthopedic aftercare: Secondary | ICD-10-CM | POA: Diagnosis not present

## 2018-04-19 DIAGNOSIS — S37032A Laceration of left kidney, unspecified degree, initial encounter: Secondary | ICD-10-CM | POA: Diagnosis not present

## 2018-04-19 DIAGNOSIS — G8911 Acute pain due to trauma: Secondary | ICD-10-CM | POA: Diagnosis not present

## 2018-04-19 DIAGNOSIS — S329XXA Fracture of unspecified parts of lumbosacral spine and pelvis, initial encounter for closed fracture: Secondary | ICD-10-CM | POA: Diagnosis not present
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
## 2018-04-29 ENCOUNTER — Ambulatory Visit (HOSPITAL_COMMUNITY)
Admission: RE | Admit: 2018-04-29 | Discharge: 2018-04-29 | Disposition: A | Discharge status: Home / Self Care | Payer: 59 | Source: Ambulatory Visit | Attending: Vascular Surgery | Admitting: Vascular Surgery

## 2018-04-29 ENCOUNTER — Encounter: Payer: Self-pay | Admitting: Vascular Surgery

## 2018-04-29 ENCOUNTER — Other Ambulatory Visit: Payer: Self-pay

## 2018-04-29 ENCOUNTER — Ambulatory Visit (INDEPENDENT_AMBULATORY_CARE_PROVIDER_SITE_OTHER): Payer: 59 | Admitting: Vascular Surgery

## 2018-04-29 VITALS — BP 129/76 | HR 76 | Temp 98.1°F | Resp 18 | Ht 69.0 in | Wt 155.0 lb

## 2018-04-29 DIAGNOSIS — I739 Peripheral vascular disease, unspecified: Secondary | ICD-10-CM | POA: Diagnosis not present

## 2018-04-29 DIAGNOSIS — I7772 Dissection of iliac artery: Secondary | ICD-10-CM | POA: Diagnosis not present

## 2018-05-06 ENCOUNTER — Ambulatory Visit (INDEPENDENT_AMBULATORY_CARE_PROVIDER_SITE_OTHER): Payer: 59 | Admitting: Psychology

## 2018-05-06 DIAGNOSIS — F4323 Adjustment disorder with mixed anxiety and depressed mood: Secondary | ICD-10-CM | POA: Diagnosis not present

## 2018-05-15 ENCOUNTER — Emergency Department (HOSPITAL_COMMUNITY)
Admission: EM | Admit: 2018-05-15 | Discharge: 2018-05-15 | Disposition: A | Discharge status: Home / Self Care | Payer: 59 | Attending: Emergency Medicine | Admitting: Emergency Medicine

## 2018-05-15 ENCOUNTER — Encounter (HOSPITAL_COMMUNITY): Payer: Self-pay

## 2018-05-15 ENCOUNTER — Other Ambulatory Visit: Payer: Self-pay

## 2018-05-15 DIAGNOSIS — F17200 Nicotine dependence, unspecified, uncomplicated: Secondary | ICD-10-CM | POA: Insufficient documentation

## 2018-05-15 DIAGNOSIS — Z79899 Other long term (current) drug therapy: Secondary | ICD-10-CM | POA: Diagnosis not present

## 2018-05-15 DIAGNOSIS — K0889 Other specified disorders of teeth and supporting structures: Secondary | ICD-10-CM | POA: Diagnosis not present

## 2018-05-15 DIAGNOSIS — K047 Periapical abscess without sinus: Secondary | ICD-10-CM | POA: Diagnosis not present

## 2018-05-15 DIAGNOSIS — Z7902 Long term (current) use of antithrombotics/antiplatelets: Secondary | ICD-10-CM | POA: Insufficient documentation

## 2018-05-15 MED ORDER — AMOXICILLIN-POT CLAVULANATE 875-125 MG PO TABS
1.0000 | ORAL_TABLET | Freq: Once | ORAL | Status: AC
  Administered 2018-05-15: 1 via ORAL
  Filled 2018-05-15: qty 1

## 2018-05-15 MED ORDER — AMOXICILLIN-POT CLAVULANATE 875-125 MG PO TABS: 1.0000 | ORAL_TABLET | Freq: Two times a day (BID) | ORAL | 0 refills | Status: AC

## 2018-05-15 MED FILL — AMOX-CLAV 875-125 MG TABLET: 875-125 | 7 days supply | Qty: 14 | Fill #0
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
## 2018-05-15 NOTE — Discharge Instructions (Signed)
Take antibiotics as directed. Please take all of your antibiotics until finished.  You can take Tylenol or Ibuprofen as directed for pain. You can alternate Tylenol and Ibuprofen every 4 hours. If you take Tylenol at 1pm, then you can take Ibuprofen at 5pm. Then you can take Tylenol again at 9pm.   The exam and treatment you received today has been provided on an emergency basis only. This is not a substitute for complete medical or dental care. If your problem worsens or new symptoms (problems) appear, and you are unable to arrange prompt follow-up care with your dentist, call or return to this location. If you do not have a dentist, please follow-up with one on the list provided  CALL YOUR DENTIST OR RETURN IMMEDIATELY IF you develop a fever, rash, difficulty breathing or swallowing, neck or facial swelling, or other potentially serious concerns.   Please follow-up with one of the dental clinics provided to you below or in your paperwork. Call and tell them you were seen in the Emergency Dept and arrange for an appointment. You may have to call multiple places in order to find a place to be seen.  Dental Assistance If the dentist on-call cannot see you, please use the resources below:   Patients with Medicaid: [facility] [address], [phone], [phone]  If unable to pay, or uninsured, contact [facility] ([phone]) or [facility] ([phone] in [place], [phone] in [place]) to become qualified for the adult dental clinic  Other Low-Cost Community Dental Services: [facility]- [address], [place], [place], [address]    [phone], Ext. [phone]    2nd and 4th Thursday of the month at 6:30am    10 clients each day by appointment, can sometimes see walk-in     patients if someone does not show for an appointment [facility]- [address], [place], [place], [address]    [phone]  [facility]- [address], [place], [place], [address]    [phone]  [facility]- [phone] [facility]- [phone] [facility]- [phone]  

## 2018-05-15 NOTE — ED Triage Notes (Signed)
Patient complains of left upper dental pain for several days, report broken tooth that has developed pain and swelling

## 2018-05-15 NOTE — ED Notes (Signed)
Declined W/C at D/C and was escorted to lobby by RN. 

## 2018-05-20 DIAGNOSIS — G8911 Acute pain due to trauma: Secondary | ICD-10-CM | POA: Diagnosis not present

## 2018-05-27 MED FILL — traMADol HCL 50 MG TABS: 50 | 8 days supply | Qty: 25 | Fill #0

## 2018-05-29 ENCOUNTER — Ambulatory Visit (INDEPENDENT_AMBULATORY_CARE_PROVIDER_SITE_OTHER): Payer: 59 | Admitting: Psychology

## 2018-05-29 DIAGNOSIS — F4323 Adjustment disorder with mixed anxiety and depressed mood: Secondary | ICD-10-CM

## 2018-06-02 ENCOUNTER — Ambulatory Visit (INDEPENDENT_AMBULATORY_CARE_PROVIDER_SITE_OTHER): Payer: 59 | Admitting: Internal Medicine

## 2018-06-02 VITALS — BP 133/80 | HR 69 | Temp 98.2°F | Wt 151.3 lb

## 2018-06-02 DIAGNOSIS — F5105 Insomnia due to other mental disorder: Principal | ICD-10-CM

## 2018-06-02 DIAGNOSIS — R12 Heartburn: Secondary | ICD-10-CM

## 2018-06-02 DIAGNOSIS — Z8781 Personal history of (healed) traumatic fracture: Secondary | ICD-10-CM

## 2018-06-02 DIAGNOSIS — G4700 Insomnia, unspecified: Secondary | ICD-10-CM | POA: Diagnosis not present

## 2018-06-02 DIAGNOSIS — M25551 Pain in right hip: Secondary | ICD-10-CM

## 2018-06-02 DIAGNOSIS — Z791 Long term (current) use of non-steroidal anti-inflammatories (NSAID): Secondary | ICD-10-CM | POA: Diagnosis not present

## 2018-06-02 DIAGNOSIS — Z599 Problem related to housing and economic circumstances, unspecified: Secondary | ICD-10-CM

## 2018-06-02 DIAGNOSIS — G8921 Chronic pain due to trauma: Secondary | ICD-10-CM | POA: Diagnosis not present

## 2018-06-02 DIAGNOSIS — F410 Panic disorder [episodic paroxysmal anxiety] without agoraphobia: Secondary | ICD-10-CM

## 2018-06-02 DIAGNOSIS — F17210 Nicotine dependence, cigarettes, uncomplicated: Secondary | ICD-10-CM | POA: Diagnosis not present

## 2018-06-02 DIAGNOSIS — F419 Anxiety disorder, unspecified: Secondary | ICD-10-CM

## 2018-06-02 DIAGNOSIS — Z79899 Other long term (current) drug therapy: Secondary | ICD-10-CM

## 2018-06-02 MED ORDER — SERTRALINE HCL 25 MG PO TABS: 25.0000 mg | ORAL_TABLET | Freq: Every day | ORAL | 2 refills | Status: DC

## 2018-06-02 MED ORDER — MELOXICAM 7.5 MG PO TABS: 15.0000 mg | ORAL_TABLET | Freq: Every day | ORAL | 2 refills | Status: DC

## 2018-06-02 MED FILL — MELOXICAM 7.5 MG TABLET: 7.5 | 15 days supply | Qty: 30 | Fill #0

## 2018-06-02 MED FILL — SERTRALINE HCL 25 MG TABLET: 25 | 30 days supply | Qty: 30 | Fill #0
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
## 2018-06-02 NOTE — Patient Instructions (Signed)
It was our pleasure taking care of you in our clinic today. You were seen due to sleep problem and anxiety. We prescribe medicine for that (Zoloft). Please take that as prescribed. We also give you Meloxicam for your hip pain. Please take it in themorning and with breakfast and make sure you take your heart burn medicine as well.  Please follow up with me in 3 weeks, let us know if you have any question or issue  Dr. Linna Hoff
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
## 2018-06-09 DIAGNOSIS — S32810D Multiple fractures of pelvis with stable disruption of pelvic ring, subsequent encounter for fracture with routine healing: Secondary | ICD-10-CM | POA: Diagnosis not present

## 2018-06-19 ENCOUNTER — Other Ambulatory Visit: Payer: Self-pay | Admitting: Internal Medicine

## 2018-06-20 DIAGNOSIS — S329XXA Fracture of unspecified parts of lumbosacral spine and pelvis, initial encounter for closed fracture: Secondary | ICD-10-CM | POA: Diagnosis not present

## 2018-06-20 DIAGNOSIS — S37032A Laceration of left kidney, unspecified degree, initial encounter: Secondary | ICD-10-CM | POA: Diagnosis not present

## 2018-06-20 DIAGNOSIS — Z4789 Encounter for other orthopedic aftercare: Secondary | ICD-10-CM | POA: Diagnosis not present

## 2018-06-20 DIAGNOSIS — G8911 Acute pain due to trauma: Secondary | ICD-10-CM | POA: Diagnosis not present

## 2018-06-22 MED FILL — PANTOPRAZOLE SOD DR 40 MG T: 40 | 90 days supply | Qty: 90 | Fill #0

## 2018-06-26 ENCOUNTER — Ambulatory Visit (INDEPENDENT_AMBULATORY_CARE_PROVIDER_SITE_OTHER): Payer: 59 | Admitting: Psychology

## 2018-06-26 DIAGNOSIS — F4323 Adjustment disorder with mixed anxiety and depressed mood: Secondary | ICD-10-CM

## 2018-06-26 MED FILL — traMADol HCL 50 MG TABS: 50 | 8 days supply | Qty: 25 | Fill #1

## 2018-07-07 ENCOUNTER — Ambulatory Visit (INDEPENDENT_AMBULATORY_CARE_PROVIDER_SITE_OTHER): Payer: 59 | Admitting: Internal Medicine

## 2018-07-07 ENCOUNTER — Encounter: Payer: Self-pay | Admitting: Internal Medicine

## 2018-07-07 ENCOUNTER — Other Ambulatory Visit: Payer: Self-pay | Admitting: Internal Medicine

## 2018-07-07 ENCOUNTER — Other Ambulatory Visit: Payer: Self-pay

## 2018-07-07 VITALS — BP 118/68 | HR 70 | Temp 97.9°F | Ht 69.0 in | Wt 147.4 lb

## 2018-07-07 DIAGNOSIS — F419 Anxiety disorder, unspecified: Secondary | ICD-10-CM

## 2018-07-07 DIAGNOSIS — Z23 Encounter for immunization: Secondary | ICD-10-CM

## 2018-07-07 DIAGNOSIS — M79604 Pain in right leg: Secondary | ICD-10-CM | POA: Diagnosis not present

## 2018-07-07 DIAGNOSIS — Z720 Tobacco use: Secondary | ICD-10-CM | POA: Diagnosis not present

## 2018-07-07 DIAGNOSIS — Z79899 Other long term (current) drug therapy: Secondary | ICD-10-CM

## 2018-07-07 DIAGNOSIS — M25551 Pain in right hip: Secondary | ICD-10-CM | POA: Diagnosis not present

## 2018-07-07 DIAGNOSIS — G8921 Chronic pain due to trauma: Secondary | ICD-10-CM

## 2018-07-07 DIAGNOSIS — D62 Acute posthemorrhagic anemia: Secondary | ICD-10-CM | POA: Diagnosis not present

## 2018-07-07 DIAGNOSIS — Z8781 Personal history of (healed) traumatic fracture: Secondary | ICD-10-CM | POA: Diagnosis not present

## 2018-07-07 DIAGNOSIS — Z79891 Long term (current) use of opiate analgesic: Secondary | ICD-10-CM

## 2018-07-07 DIAGNOSIS — Z9889 Other specified postprocedural states: Secondary | ICD-10-CM

## 2018-07-07 DIAGNOSIS — F329 Major depressive disorder, single episode, unspecified: Secondary | ICD-10-CM

## 2018-07-07 DIAGNOSIS — S32810D Multiple fractures of pelvis with stable disruption of pelvic ring, subsequent encounter for fracture with routine healing: Secondary | ICD-10-CM

## 2018-07-07 DIAGNOSIS — Z791 Long term (current) use of non-steroidal anti-inflammatories (NSAID): Secondary | ICD-10-CM

## 2018-07-07 MED ORDER — GABAPENTIN 300 MG PO CAPS: 300.0000 mg | ORAL_CAPSULE | Freq: Three times a day (TID) | ORAL | 1 refills | Status: DC

## 2018-07-07 MED ORDER — SERTRALINE HCL 25 MG PO TABS: 25.0000 mg | ORAL_TABLET | Freq: Every day | ORAL | 2 refills | Status: DC

## 2018-07-07 MED ORDER — ACETAMINOPHEN 500 MG PO TABS: 1000.0000 mg | ORAL_TABLET | Freq: Three times a day (TID) | ORAL | 1 refills | Status: DC | PRN

## 2018-07-07 MED FILL — SERTRALINE HCL 25 MG TABLET: 25 | 30 days supply | Qty: 30 | Fill #0

## 2018-07-07 MED FILL — GABAPENTIN 300 MG CAPSULE: 300 | 60 days supply | Qty: 180 | Fill #0

## 2018-07-07 NOTE — Patient Instructions (Addendum)
It was a pleasure meeting you today, [name]!  1. For your hip and leg pain - Start taking high-dose Tylenol. Take 1,000 mg every 8 hours (three times per day).  - Increase your gabapentin from twice a day to three times a day. - Continue taking tramadol - Stop taking Meloxicam, since this did not help. - Come back to clinic in one month if this new regimen is not helping.   2. For your anxiety and difficulty sleeping - Continue taking the Zoloft  3. For a history of low blood count - I will get labs today to measure your blood count. I'll call you if the results are abnormal.  4. You're doing an amazing job cutting down on smoking! Keep up the good work!  5. Please follow-up with me in 6 months or sooner if you have any issues.  Thanks, Dr. Annie Paras

## 2018-07-07 NOTE — Assessment & Plan Note (Signed)
Taylor Ochoa was seen in [facility] one month ago for anxiety and insomnia. He was started on Zoloft 25mg  daily. Today he reports that his anxiety is much improved and he has been sleeping through the night.   Plan 1. Continue Zoloft 25mg  daily
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
## 2018-07-07 NOTE — Telephone Encounter (Signed)
Needs refill on acetaminophen (TYLENOL) 500 MG tablet [facility]   ;pt contact  [phone]
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
## 2018-07-07 NOTE — Telephone Encounter (Signed)
Called pharm, it will be cheaper for pt to but otc, they will ph pt and inform him

## 2018-07-07 NOTE — Assessment & Plan Note (Signed)
Patient has cut down from 3 packs per day to 12 cigarettes per day and is very proud of his progress. He plans to continue weaning. He is not interested in pharmacotherapy at this time.

## 2018-07-08 LAB — CBC
Hematocrit: 41.6 % (ref 37.5–51.0)
Hemoglobin: 13.8 g/dL (ref 13.0–17.7)
MCH: 31.1 pg (ref 26.6–33.0)
MCHC: 33.2 g/dL (ref 31.5–35.7)
MCV: 94 fL (ref 79–97)
PLATELETS: 283 10*3/uL (ref 150–450)
RBC: 4.44 x10E6/uL (ref 4.14–5.80)
RDW: 12.9 % (ref 12.3–15.4)
WBC: 6.8 10*3/uL (ref 3.4–10.8)

## 2018-07-09 NOTE — Progress Notes (Signed)
I saw and evaluated the patient.  I personally confirmed the key portions of Dr. Dorrell's history and exam and reviewed pertinent patient test results.  The assessment, diagnosis, and plan were formulated together and I agree with the documentation in the resident's note. 

## 2018-07-20 DIAGNOSIS — G8911 Acute pain due to trauma: Secondary | ICD-10-CM | POA: Diagnosis not present

## 2018-07-20 DIAGNOSIS — Z4789 Encounter for other orthopedic aftercare: Secondary | ICD-10-CM | POA: Diagnosis not present

## 2018-07-20 DIAGNOSIS — S329XXA Fracture of unspecified parts of lumbosacral spine and pelvis, initial encounter for closed fracture: Secondary | ICD-10-CM | POA: Diagnosis not present

## 2018-07-20 DIAGNOSIS — S37032A Laceration of left kidney, unspecified degree, initial encounter: Secondary | ICD-10-CM | POA: Diagnosis not present

## 2018-07-29 ENCOUNTER — Ambulatory Visit: Payer: 59 | Admitting: Psychology

## 2018-08-06 ENCOUNTER — Other Ambulatory Visit: Payer: Self-pay

## 2018-08-06 NOTE — Telephone Encounter (Signed)
traMADol (ULTRAM) 50 MG tablet  Refill request @ [facility] outpatient pharmacy.

## 2018-08-07 NOTE — Telephone Encounter (Signed)
I spoke with Taylor Ochoa about his tramadol refill. This medication was last prescribed by PA Simaan with surgery in May. At that time he was given 28 tablets with 0 refills. He states that he is taking it once or twice a day. I asked the patient where he has been getting the medication, and he believes it was being prescribed through the internal medicine clinic, but he didn't have the bottle with him. I asked him to look at the bottle and let us know who prescribed it. Until then, I won't refill the medication. He has no pain contract on file with the [facility]

## 2018-08-10 NOTE — Telephone Encounter (Signed)
Called [facility], a dr haddocks(?sp) from [facility] has been filling med, dr dorrell notified

## 2018-08-11 MED FILL — SERTRALINE HCL 25 MG TABLET: 25 | 30 days supply | Qty: 30 | Fill #1

## 2018-08-11 MED FILL — traMADol HCL 50 MG TABS: 50 | 12 days supply | Qty: 25 | Fill #0

## 2018-08-20 DIAGNOSIS — S37032A Laceration of left kidney, unspecified degree, initial encounter: Secondary | ICD-10-CM | POA: Diagnosis not present

## 2018-08-20 DIAGNOSIS — Z4789 Encounter for other orthopedic aftercare: Secondary | ICD-10-CM | POA: Diagnosis not present

## 2018-08-20 DIAGNOSIS — G8911 Acute pain due to trauma: Secondary | ICD-10-CM | POA: Diagnosis not present

## 2018-08-20 DIAGNOSIS — S329XXA Fracture of unspecified parts of lumbosacral spine and pelvis, initial encounter for closed fracture: Secondary | ICD-10-CM | POA: Diagnosis not present

## 2018-09-01 DIAGNOSIS — S32810D Multiple fractures of pelvis with stable disruption of pelvic ring, subsequent encounter for fracture with routine healing: Secondary | ICD-10-CM | POA: Diagnosis not present

## 2018-09-03 ENCOUNTER — Other Ambulatory Visit: Payer: Self-pay | Admitting: Student

## 2018-09-03 DIAGNOSIS — R102 Pelvic and perineal pain: Secondary | ICD-10-CM

## 2018-09-08 ENCOUNTER — Ambulatory Visit
Admission: RE | Admit: 2018-09-08 | Discharge: 2018-09-08 | Disposition: A | Discharge status: Home / Self Care | Payer: 59 | Source: Ambulatory Visit | Attending: Student | Admitting: Student

## 2018-09-08 DIAGNOSIS — S32810A Multiple fractures of pelvis with stable disruption of pelvic ring, initial encounter for closed fracture: Secondary | ICD-10-CM | POA: Diagnosis not present

## 2018-09-08 DIAGNOSIS — R102 Pelvic and perineal pain: Secondary | ICD-10-CM

## 2018-09-08 DIAGNOSIS — K4090 Unilateral inguinal hernia, without obstruction or gangrene, not specified as recurrent: Secondary | ICD-10-CM | POA: Diagnosis not present
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
## 2018-09-11 MED FILL — SERTRALINE HCL 25 MG TABLET: 25 | 30 days supply | Qty: 30 | Fill #2

## 2018-09-19 DIAGNOSIS — S37032A Laceration of left kidney, unspecified degree, initial encounter: Secondary | ICD-10-CM | POA: Diagnosis not present

## 2018-09-19 DIAGNOSIS — S329XXA Fracture of unspecified parts of lumbosacral spine and pelvis, initial encounter for closed fracture: Secondary | ICD-10-CM | POA: Diagnosis not present

## 2018-09-19 DIAGNOSIS — Z4789 Encounter for other orthopedic aftercare: Secondary | ICD-10-CM | POA: Diagnosis not present

## 2018-09-19 DIAGNOSIS — G8911 Acute pain due to trauma: Secondary | ICD-10-CM | POA: Diagnosis not present

## 2018-09-30 ENCOUNTER — Other Ambulatory Visit: Payer: Self-pay | Admitting: Internal Medicine

## 2018-09-30 MED FILL — GABAPENTIN 300 MG CAPSULE: 300 | 60 days supply | Qty: 180 | Fill #1

## 2018-09-30 MED FILL — PANTOPRAZOLE SOD DR 40 MG T: 40 | 90 days supply | Qty: 90 | Fill #1

## 2018-10-07 ENCOUNTER — Ambulatory Visit (INDEPENDENT_AMBULATORY_CARE_PROVIDER_SITE_OTHER): Payer: No Typology Code available for payment source | Admitting: Psychology

## 2018-10-07 DIAGNOSIS — F4323 Adjustment disorder with mixed anxiety and depressed mood: Secondary | ICD-10-CM | POA: Diagnosis not present

## 2018-10-13 MED FILL — SERTRALINE HCL 25 MG TABLET: 25 | 90 days supply | Qty: 90 | Fill #0

## 2018-10-20 DIAGNOSIS — S37032A Laceration of left kidney, unspecified degree, initial encounter: Secondary | ICD-10-CM | POA: Diagnosis not present

## 2018-10-20 DIAGNOSIS — Z4789 Encounter for other orthopedic aftercare: Secondary | ICD-10-CM | POA: Diagnosis not present

## 2018-10-20 DIAGNOSIS — G8911 Acute pain due to trauma: Secondary | ICD-10-CM | POA: Diagnosis not present

## 2018-10-20 DIAGNOSIS — S329XXA Fracture of unspecified parts of lumbosacral spine and pelvis, initial encounter for closed fracture: Secondary | ICD-10-CM | POA: Diagnosis not present

## 2018-11-20 DIAGNOSIS — Z4789 Encounter for other orthopedic aftercare: Secondary | ICD-10-CM | POA: Diagnosis not present

## 2018-11-20 DIAGNOSIS — S329XXA Fracture of unspecified parts of lumbosacral spine and pelvis, initial encounter for closed fracture: Secondary | ICD-10-CM | POA: Diagnosis not present

## 2018-11-20 DIAGNOSIS — S37032A Laceration of left kidney, unspecified degree, initial encounter: Secondary | ICD-10-CM | POA: Diagnosis not present

## 2018-11-20 DIAGNOSIS — G8911 Acute pain due to trauma: Secondary | ICD-10-CM | POA: Diagnosis not present

## 2018-11-30 MED FILL — GABAPENTIN 300 MG CAPSULE: 300 | 90 days supply | Qty: 270 | Fill #1

## 2018-12-01 ENCOUNTER — Ambulatory Visit (INDEPENDENT_AMBULATORY_CARE_PROVIDER_SITE_OTHER): Payer: Self-pay | Admitting: Internal Medicine

## 2018-12-01 ENCOUNTER — Other Ambulatory Visit: Payer: Self-pay

## 2018-12-01 ENCOUNTER — Encounter: Payer: Self-pay | Admitting: Internal Medicine

## 2018-12-01 VITALS — BP 149/92 | HR 64 | Temp 97.7°F | Ht 69.0 in | Wt 159.4 lb

## 2018-12-01 DIAGNOSIS — S32810D Multiple fractures of pelvis with stable disruption of pelvic ring, subsequent encounter for fracture with routine healing: Secondary | ICD-10-CM

## 2018-12-01 DIAGNOSIS — F419 Anxiety disorder, unspecified: Secondary | ICD-10-CM

## 2018-12-01 DIAGNOSIS — K219 Gastro-esophageal reflux disease without esophagitis: Secondary | ICD-10-CM

## 2018-12-01 DIAGNOSIS — Z720 Tobacco use: Secondary | ICD-10-CM

## 2018-12-01 DIAGNOSIS — R030 Elevated blood-pressure reading, without diagnosis of hypertension: Secondary | ICD-10-CM

## 2018-12-01 DIAGNOSIS — X58XXXD Exposure to other specified factors, subsequent encounter: Secondary | ICD-10-CM

## 2018-12-01 DIAGNOSIS — Z0000 Encounter for general adult medical examination without abnormal findings: Secondary | ICD-10-CM

## 2018-12-01 DIAGNOSIS — G8921 Chronic pain due to trauma: Secondary | ICD-10-CM

## 2018-12-01 DIAGNOSIS — Z79899 Other long term (current) drug therapy: Secondary | ICD-10-CM

## 2018-12-01 DIAGNOSIS — K4020 Bilateral inguinal hernia, without obstruction or gangrene, not specified as recurrent: Secondary | ICD-10-CM

## 2018-12-01 NOTE — Patient Instructions (Signed)
[name],  For your hernia - I have referred you to general surgery. You should hear from their office about scheduling an appointment. - Avoid heavy lifting until you see them.  - Continue to take ibuprofen as needed for pain.  Your blood pressure is elevated today. We will recheck this at your next visit.  Please return in 6 months for a blood pressure check.  Thanks, Dr. Annie Paras
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
## 2018-12-02 ENCOUNTER — Encounter: Payer: Self-pay | Admitting: Internal Medicine

## 2018-12-02 DIAGNOSIS — K4020 Bilateral inguinal hernia, without obstruction or gangrene, not specified as recurrent: Secondary | ICD-10-CM | POA: Insufficient documentation

## 2018-12-02 DIAGNOSIS — R030 Elevated blood-pressure reading, without diagnosis of hypertension: Secondary | ICD-10-CM | POA: Insufficient documentation

## 2018-12-02 NOTE — Assessment & Plan Note (Signed)
Mr. Einstein pain has improved. He tried the high dose tylenol, but it did not help him. He has been taking ibuprofen 400mg  BID for the pain. He also takes gabapentin. He no longer takes tramadol. He feels his pain is adequately controlled currently.  Plan - Continue ibuprofen prn - Continue gabapentin 300mg  TID
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
## 2018-12-02 NOTE — Assessment & Plan Note (Signed)
Patient has been smoking more in recent months. Currently smoking 12 cigarettes daily. He rolls his own cigarettes. He is not interested in quitting or cutting back at this time. Advised the patient that our clinic can provide support and resources if and when he is ready.

## 2018-12-02 NOTE — Assessment & Plan Note (Signed)
Patient has never had a colonoscopy. He would like to defer colonoscopy until after he has his hernias evaluated and possibly repaired.  Plan - Discuss colonoscopy referral at next appt in 6 months

## 2018-12-02 NOTE — Assessment & Plan Note (Signed)
Blood pressure is 149/92 today. He has no history of HTN. On chart review, his blood pressure readings at his last three visits were 118/68, 133/80, and 126/74.  Plan - Monitor BP at f/u in 6 months. If still elevated, consider initiation of lifestyle modifications or pharmacotherapy.

## 2018-12-02 NOTE — Assessment & Plan Note (Signed)
Anxiety is stable. Continue zoloft 25mg  daily.

## 2018-12-04 NOTE — Progress Notes (Signed)
Internal Medicine Clinic Attending  Case discussed with Dr. Dorrell at the time of the visit.  We reviewed the resident's history and exam and pertinent patient test results.  I agree with the assessment, diagnosis, and plan of care documented in the resident's note.    

## 2018-12-19 DIAGNOSIS — S37032A Laceration of left kidney, unspecified degree, initial encounter: Secondary | ICD-10-CM | POA: Diagnosis not present

## 2018-12-19 DIAGNOSIS — S329XXA Fracture of unspecified parts of lumbosacral spine and pelvis, initial encounter for closed fracture: Secondary | ICD-10-CM | POA: Diagnosis not present

## 2018-12-19 DIAGNOSIS — Z4789 Encounter for other orthopedic aftercare: Secondary | ICD-10-CM | POA: Diagnosis not present

## 2018-12-19 DIAGNOSIS — G8911 Acute pain due to trauma: Secondary | ICD-10-CM | POA: Diagnosis not present

## 2018-12-28 ENCOUNTER — Other Ambulatory Visit: Payer: Self-pay | Admitting: Internal Medicine

## 2018-12-29 MED FILL — SERTRALINE HCL 25 MG TABLET: 25 | 90 days supply | Qty: 90 | Fill #0

## 2019-01-12 ENCOUNTER — Encounter: Payer: Self-pay | Admitting: Internal Medicine

## 2019-01-12 ENCOUNTER — Telehealth: Payer: Self-pay | Admitting: *Deleted

## 2019-01-12 NOTE — Telephone Encounter (Signed)
LVM FOR PATIENT TO CALL [facility] [phone]) TO MAKE HIS REFERRAL DUE TO THE TYPE OF COVERAGE  PLAN THAT HE HAS. IF HE HAS ANY QUESTIONS TO CALL THE CLINIC AT [phone] AND ASK FOR CHILION.

## 2019-01-21 ENCOUNTER — Encounter: Payer: Self-pay | Admitting: *Deleted

## 2019-01-21 MED FILL — PANTOPRAZOLE SOD DR 40 MG T: 40 | 90 days supply | Qty: 90 | Fill #0

## 2019-02-16 ENCOUNTER — Other Ambulatory Visit: Payer: Self-pay

## 2019-02-16 ENCOUNTER — Ambulatory Visit (INDEPENDENT_AMBULATORY_CARE_PROVIDER_SITE_OTHER): Payer: Self-pay | Admitting: Nurse Practitioner

## 2019-02-16 VITALS — BP 115/80 | HR 75 | Temp 98.3°F | Resp 18 | Wt 159.6 lb

## 2019-02-16 DIAGNOSIS — M272 Inflammatory conditions of jaws: Secondary | ICD-10-CM

## 2019-02-16 DIAGNOSIS — K0889 Other specified disorders of teeth and supporting structures: Secondary | ICD-10-CM

## 2019-02-16 MED ORDER — AMOXICILLIN-POT CLAVULANATE 875-125 MG PO TABS: 1.0000 | ORAL_TABLET | Freq: Two times a day (BID) | ORAL | 0 refills | Status: AC

## 2019-02-16 MED FILL — AMOX-CLAV 875-125 MG TABLET: 875-125 | 7 days supply | Qty: 14 | Fill #0

## 2019-02-16 NOTE — Patient Instructions (Addendum)
Dental Abscess -Based on the condition of your teeth, it is imperative that you follow-up with the [facility] within the next 24 hours.  You can reach them at area code [phone].  It is strongly urged you to go to an appointment as soon as possible. -Take the antibiotic as prescribed. -You may continue taking the Tylenol as needed for pain.  If needed, you can also add ibuprofen 800 mg every 8-10 hours as needed for pain. -Warm salt water gargles to help with the dental pain. -You may continue icing the affected areas to help with pain and swelling. -You need to stop smoking while you are having these dental symptoms. -If your symptoms worsen to include fever, chills, worsening pain, swelling, abdominal pain or weakness, you need to go to the emergency department immediately.  A dental abscess is a collection of pus in or around a tooth that results from an infection. An abscess can cause pain in the affected area as well as other symptoms. Treatment is important to help with symptoms and to prevent the infection from spreading. What are the causes? This condition is caused by a bacterial infection around the root of the tooth that involves the inner part of the tooth (pulp). It may result from:  Severe tooth decay.  Trauma to the tooth, such as a broken or chipped tooth, that allows bacteria to enter into the pulp.  Severe gum disease around a tooth. What increases the risk? This condition is more likely to develop in males. It is also more likely to develop in people who:  Have dental decay (cavities).  Eat sugary snacks between meals.  Use tobacco products.  Have diabetes.  Have a weakened disease-fighting system (immune system).  Do not brush and care for their teeth regularly. What are the signs or symptoms? Symptoms of this condition include:  Severe pain in and around the infected tooth.  Swelling and redness around the infected tooth, in the  mouth, or in the face.  Tenderness.  Pus drainage.  Bad breath.  Bitter taste in the mouth.  Difficulty swallowing.  Difficulty opening the mouth.  Nausea.  Vomiting.  Chills.  Swollen neck glands.  Fever. How is this diagnosed? This condition is diagnosed based on:  Your symptoms and your medical and dental history.  An examination of the infected tooth. During the exam, your dentist may tap on the infected tooth. You may also have X-rays of the affected area. How is this treated? This condition is treated by getting rid of the infection. This may be done with:  Incision and drainage. This procedure is done by making an incision in the abscess to drain out the pus. Removing pus is the first priority in treating an abscess.  Antibiotic medicines. These may be used in certain situations.  Antibacterial mouth rinse.  A root canal. This may be performed to save the tooth. Your dentist accesses the visible part of your tooth (crown) with a drill and removes any damaged pulp. Then the space is filled and sealed off.  Tooth extraction. The tooth is pulled out if it cannot be saved by other treatment. You may also receive treatment for pain, such as:  Acetaminophen or NSAIDs.  Gels that contain a numbing medicine.  An injection to block the pain near your nerve. Follow these instructions at home: Medicines  Take over-the-counter and prescription medicines only as told by your dentist.  If you were prescribed an antibiotic, take it  as told by your dentist. Do not stop taking the antibiotic even if you start to feel better.  If you were prescribed a gel that contains a numbing medicine, use it exactly as told in the directions. Do not use these gels for children who are younger than 96 years of age.  Do not drive or use heavy machinery while taking prescription pain medicine. General instructions  Rinse out your mouth often with salt water to relieve pain or  swelling. To make a salt-water mixture, completely dissolve -1 tsp of salt in 1 cup of warm water.  Eat a soft diet while your abscess is healing.  Drink enough fluid to keep your urine pale yellow.  Do not apply heat to the outside of your mouth.  Do not use any products that contain nicotine or tobacco, such as cigarettes and e-cigarettes. If you need help quitting, ask your health care provider.  Keep all follow-up visits as told by your dentist. This is important. How is this prevented?  Brush your teeth every morning and night with fluoride toothpaste. Floss one time each day.  Get regularly scheduled dental cleanings.  Consider having a dental sealant applied on teeth that have deep holes (caries).  Drink fluoridated water regularly. This includes most tap water. Check the label on bottled water to see if it contains fluoride.  Drink water instead of sugary drinks.  Eat healthy meals and snacks.  Wear a mouth guard or face shield to protect your teeth while playing sports. Contact a health care provider if:  Your pain is worse and is not helped by medicine. Get help right away if:  You have a fever or chills.  Your symptoms suddenly get worse.  You have a very bad headache.  You have problems breathing or swallowing.  You have trouble opening your mouth.  You have swelling in your neck or around your eye. Summary  A dental abscess is a collection of pus in or around a tooth that results from an infection.  A dental abscess may result from severe tooth decay, trauma to the tooth, or severe gum disease around a tooth.  Symptoms include severe pain, swelling, redness, and drainage of pus in and around the infected tooth.  The first priority in treating a dental abscess is to drain out the pus. Treatment may also involve removing damage inside the tooth (root canal) or pulling out (extracting) the tooth. This information is not intended to replace advice given to  you by your health care provider. Make sure you discuss any questions you have with your health care provider. Document Released: 09/09/2005 Document Revised: 05/12/2017 Document Reviewed: 05/12/2017 Elsevier Interactive Patient Education  2019 [employer] Dental pain may be caused by many things, including:  Tooth decay (cavities or caries). Cavities expose the nerve of your tooth to air and to hot or cold temperatures. This can cause pain or discomfort.  Abscess or infection. A dental abscess is a collection of pus from a bacterial infection in the inner part of the tooth (pulp). It usually occurs at the end of the root of a tooth.  Injury.  An unknown reason (idiopathic). Your pain may be mild or severe. It may occur when you are:  Chewing.  Exposed to hot or cold temperatures.  Eating or drinking sugary foods or beverages, such as soda or candy. Your pain may be constant, or it may come and go without cause. Follow these instructions at home:  Watch your dental pain for any changes. The following actions may help to lessen any discomfort that you are feeling: Medicines  Take over-the-counter and prescription medicines only as told by your health care provider.  If you were prescribed an antibiotic medicine, take it as told by your health care provider. Do not stop taking the antibiotic even if you start to feel better. Eating and drinking  Avoid foods or drinks that cause you pain, such as: ? Very hot or very cold foods or drinks. ? Sweet or sugary foods or drinks. Managing pain and swelling  Apply ice to the painful area of your face: ? Put ice in a plastic bag. ? Place a towel between your skin and the bag. ? Leave the ice on for 20 minutes, 2-3 times a day. Brushing your teeth  To keep your mouth and gums healthy, use fluoride toothpaste to brush your teeth twice a day. Floss once a day.  Use a toothpaste made for sensitive teeth if directed by your  health care provider.  Brush your teeth with a soft-bristled toothbrush. General instructions  Do not apply heat to the outside of your face.  Gargle with a salt-water mixture 3-4 times a day or as needed. To make a salt-water mixture, completely dissolve -1 tsp of salt in 1 cup of warm water.  Keep all follow-up visits as told by your health care provider. This is important.  Apply ice to the outside of your jaw if there is swelling. Do not put ice directly on the skin. Contact a health care provider if:  Your pain is not controlled with medicines.  Your symptoms get worse.  You have new symptoms. Get help right away if you:  Are unable to open your mouth.  Are having trouble breathing or swallowing.  Have a fever.  Notice that your face, neck, or jaw is swollen. Summary  Dental pain may be caused by many things, including tooth decay and infection.  Your pain may be mild or severe.  Take over-the-counter and prescription medicines only as told by your health care provider.  Watch your dental pain for any changes. Let your health care provider know if symptoms get worse. This information is not intended to replace advice given to you by your health care provider. Make sure you discuss any questions you have with your health care provider. Document Released: 09/09/2005 Document Revised: 07/31/2017 Document Reviewed: 07/31/2017 Elsevier Interactive Patient Education  2019 [employer].
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
## 2019-02-17 NOTE — Addendum Note (Signed)
Addended by: Hulan Fray on: 02/17/2019 05:07 PM   Modules accepted: Orders

## 2019-03-01 ENCOUNTER — Other Ambulatory Visit: Payer: Self-pay | Admitting: Internal Medicine

## 2019-03-01 MED ORDER — GABAPENTIN 300 MG PO CAPS: 300.0000 mg | ORAL_CAPSULE | Freq: Three times a day (TID) | ORAL | 1 refills | Status: DC

## 2019-03-01 MED FILL — GABAPENTIN 300 MG CAPSULE: 300 | 60 days supply | Qty: 180 | Fill #0

## 2019-03-01 NOTE — Telephone Encounter (Signed)
Pt needs refills on gabapentin (NEURONTIN) 300 MG capsule  [place], [place] - [address] [facility]. ;pt contact [phone]

## 2019-03-04 ENCOUNTER — Telehealth: Payer: Self-pay | Admitting: *Deleted

## 2019-03-04 NOTE — Telephone Encounter (Signed)
SPOKE WITH LINDA (WIFE) STATES THEY GOT MESSAGE LEFT BY [facility].  SHE REQUEST THAT [facility] CANCEL THIS REFERRAL UNTIL LATER DATE. THEY HAVE A LOT OF STUFF GOING ON RIGHT NOW. WILL LET HIS DR. Gwyndolyn Kaufman WHEN HE IS READY TO REMAKE THIS REFERRAL.

## 2019-03-04 NOTE — Telephone Encounter (Signed)
SPOKE WITH SABRINA, PER OFFICE PATIENT WAS CONTACTED ,LVM FOR PATIENT TO RETURN CALL TO OFFICE. APPOINTMENT CAN'T BE MADE UNTIL PATIENT CONTACT OFFICE.

## 2019-03-14 ENCOUNTER — Encounter: Payer: Self-pay | Admitting: *Deleted

## 2019-03-17 ENCOUNTER — Telehealth: Payer: Self-pay | Admitting: Internal Medicine

## 2019-03-17 NOTE — Telephone Encounter (Signed)
Patient's wife [name], who is a current patient of yours) called asking if you would be willing to see her husband as a new patient. See message below. Please advise.

## 2019-03-17 NOTE — Telephone Encounter (Signed)
Copied from [place] [id number]. Topic: Appointment Scheduling - Scheduling Inquiry for Clinic >> Mar 17, 2019 10:45 AM Valla Leaver wrote: Reason for CRM: Wife Vaughan Basta, calling to see if Dr. Ronnald Ramp will accept her husband as a new patient? Ghali Morissette MRN [id number], already a patient of [facility]. Please advise.

## 2019-03-17 NOTE — Telephone Encounter (Signed)
Yes, I will see him  TJ 

## 2019-03-17 NOTE — Telephone Encounter (Signed)
Appointment scheduled.

## 2019-03-18 MED FILL — SERTRALINE HCL 25 MG TABLET: 25 | 90 days supply | Qty: 90 | Fill #0

## 2019-03-25 ENCOUNTER — Ambulatory Visit: Payer: No Typology Code available for payment source | Admitting: Internal Medicine

## 2019-04-08 ENCOUNTER — Other Ambulatory Visit: Payer: Self-pay

## 2019-04-08 ENCOUNTER — Encounter: Payer: Self-pay | Admitting: Internal Medicine

## 2019-04-08 ENCOUNTER — Ambulatory Visit (INDEPENDENT_AMBULATORY_CARE_PROVIDER_SITE_OTHER): Payer: No Typology Code available for payment source | Admitting: Internal Medicine

## 2019-04-08 ENCOUNTER — Other Ambulatory Visit (INDEPENDENT_AMBULATORY_CARE_PROVIDER_SITE_OTHER): Payer: No Typology Code available for payment source

## 2019-04-08 VITALS — BP 124/76 | HR 68 | Temp 98.8°F | Resp 16 | Ht 69.0 in | Wt 162.5 lb

## 2019-04-08 DIAGNOSIS — Z720 Tobacco use: Secondary | ICD-10-CM

## 2019-04-08 DIAGNOSIS — J431 Panlobular emphysema: Secondary | ICD-10-CM

## 2019-04-08 DIAGNOSIS — Z23 Encounter for immunization: Secondary | ICD-10-CM

## 2019-04-08 DIAGNOSIS — Z0000 Encounter for general adult medical examination without abnormal findings: Secondary | ICD-10-CM | POA: Diagnosis not present

## 2019-04-08 DIAGNOSIS — L602 Onychogryphosis: Secondary | ICD-10-CM

## 2019-04-08 DIAGNOSIS — R739 Hyperglycemia, unspecified: Secondary | ICD-10-CM | POA: Diagnosis not present

## 2019-04-08 DIAGNOSIS — I7772 Dissection of iliac artery: Secondary | ICD-10-CM

## 2019-04-08 DIAGNOSIS — E785 Hyperlipidemia, unspecified: Secondary | ICD-10-CM

## 2019-04-08 DIAGNOSIS — F411 Generalized anxiety disorder: Secondary | ICD-10-CM

## 2019-04-08 DIAGNOSIS — Z1211 Encounter for screening for malignant neoplasm of colon: Secondary | ICD-10-CM

## 2019-04-08 LAB — HEMOGLOBIN A1C: Hgb A1c MFr Bld: 6.1 % (ref 4.6–6.5)

## 2019-04-08 LAB — CBC WITH DIFFERENTIAL/PLATELET
Basophils Absolute: 0.1 10*3/uL (ref 0.0–0.1)
Basophils Relative: 0.7 % (ref 0.0–3.0)
Eosinophils Absolute: 0.1 10*3/uL (ref 0.0–0.7)
Eosinophils Relative: 1.4 % (ref 0.0–5.0)
HCT: 42.7 % (ref 39.0–52.0)
Hemoglobin: 14.3 g/dL (ref 13.0–17.0)
Lymphocytes Relative: 34.9 % (ref 12.0–46.0)
Lymphs Abs: 2.8 10*3/uL (ref 0.7–4.0)
MCHC: 33.5 g/dL (ref 30.0–36.0)
MCV: 95.7 fl (ref 78.0–100.0)
Monocytes Absolute: 0.6 10*3/uL (ref 0.1–1.0)
Monocytes Relative: 7.5 % (ref 3.0–12.0)
Neutro Abs: 4.4 10*3/uL (ref 1.4–7.7)
Neutrophils Relative %: 55.5 % (ref 43.0–77.0)
Platelets: 224 10*3/uL (ref 150.0–400.0)
RBC: 4.46 Mil/uL (ref 4.22–5.81)
RDW: 14 % (ref 11.5–15.5)
WBC: 8 10*3/uL (ref 4.0–10.5)

## 2019-04-08 LAB — BASIC METABOLIC PANEL
BUN: 20 mg/dL (ref 6–23)
CO2: 27 mEq/L (ref 19–32)
Calcium: 8.9 mg/dL (ref 8.4–10.5)
Chloride: 104 mEq/L (ref 96–112)
Creatinine, Ser: 0.98 mg/dL (ref 0.40–1.50)
GFR: 77.27 mL/min (ref >60.00)
Glucose, Bld: 93 mg/dL (ref 70–99)
Potassium: 4.1 mEq/L (ref 3.5–5.1)
Sodium: 138 mEq/L (ref 135–145)

## 2019-04-08 LAB — URINALYSIS, ROUTINE W REFLEX MICROSCOPIC
Hgb urine dipstick: NEGATIVE
Leukocytes,Ua: NEGATIVE
Nitrite: NEGATIVE
Specific Gravity, Urine: >=1.03 — AB (ref 1.000–1.030)
Total Protein, Urine: NEGATIVE
Urine Glucose: NEGATIVE
Urobilinogen, UA: 0.2 (ref 0.0–1.0)
pH: 5 (ref 5.0–8.0)

## 2019-04-08 LAB — LIPID PANEL
Cholesterol: 197 mg/dL (ref 0–200)
HDL: 41.4 mg/dL (ref >39.00)
LDL Cholesterol: 132 mg/dL — ABNORMAL HIGH (ref 0–99)
NonHDL: 155.37
Total CHOL/HDL Ratio: 5
Triglycerides: 119 mg/dL (ref 0.0–149.0)
VLDL: 23.8 mg/dL (ref 0.0–40.0)

## 2019-04-08 LAB — PSA: PSA: 2.66 ng/mL (ref 0.10–4.00)

## 2019-04-08 LAB — HEPATIC FUNCTION PANEL
ALT: 12 U/L (ref 0–53)
AST: 13 U/L (ref 0–37)
Albumin: 4.3 g/dL (ref 3.5–5.2)
Alkaline Phosphatase: 72 U/L (ref 39–117)
Bilirubin, Direct: 0.1 mg/dL (ref 0.0–0.3)
Total Bilirubin: 0.9 mg/dL (ref 0.2–1.2)
Total Protein: 6.7 g/dL (ref 6.0–8.3)

## 2019-04-08 LAB — TSH: TSH: 1.55 u[IU]/mL (ref 0.35–4.50)

## 2019-04-08 MED ORDER — ROSUVASTATIN CALCIUM 20 MG PO TABS: 20.0000 mg | ORAL_TABLET | Freq: Every day | ORAL | 1 refills | Status: DC

## 2019-04-08 MED ORDER — SERTRALINE HCL 25 MG PO TABS: 25.0000 mg | ORAL_TABLET | Freq: Every day | ORAL | 1 refills | Status: DC

## 2019-04-08 MED FILL — ROSUVASTATIN CALCIUM 20 MG: 20 | 90 days supply | Qty: 90 | Fill #0

## 2019-04-08 NOTE — Patient Instructions (Signed)
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
## 2019-04-10 ENCOUNTER — Encounter: Payer: Self-pay | Admitting: Internal Medicine

## 2019-04-14 ENCOUNTER — Telehealth: Payer: Self-pay | Admitting: *Deleted

## 2019-04-14 DIAGNOSIS — Z122 Encounter for screening for malignant neoplasm of respiratory organs: Secondary | ICD-10-CM

## 2019-04-14 DIAGNOSIS — F17210 Nicotine dependence, cigarettes, uncomplicated: Secondary | ICD-10-CM

## 2019-04-15 NOTE — Telephone Encounter (Signed)
Spoke with pt's wife and scheduled SDMV with Derl Barrow, NP 05/11/19 1:30 CT ordered Nothing further needed

## 2019-05-10 NOTE — Progress Notes (Signed)
Shared Decision Making Visit Lung Cancer Screening Program [phone])   Eligibility:  Age 63 y.o.  Pack Years Smoking History Calculation 71 (# packs/per year x # years smoked)  Recent History of coughing up blood  no  Unexplained weight loss? no ( >Than 15 pounds within the last 6 months )  Prior History Lung / other cancer no (Diagnosis within the last 5 years already requiring surveillance chest CT Scans).  Smoking Status Current Smoker   Visit Components:  Discussion included one or more decision making aids. yes  Discussion included risk/benefits of screening. yes  Discussion included potential follow up diagnostic testing for abnormal scans. yes  Discussion included meaning and risk of over diagnosis. yes  Discussion included meaning and risk of False Positives. yes  Discussion included meaning of total radiation exposure. yes  Counseling Included:  Importance of adherence to annual lung cancer LDCT screening. yes  Impact of comorbidities on ability to participate in the program. yes  Ability and willingness to under diagnostic treatment. yes  Smoking Cessation Counseling:  Current Smokers:   Discussed importance of smoking cessation. yes  Information about tobacco cessation classes and interventions provided to patient. yes  Patient provided with "ticket" for LDCT Scan. yes  Symptomatic Patient. no  Counseling(Intermediate counseling: > three minutes) 99406  Diagnosis Code: Tobacco Use Z72.0  Asymptomatic Patient yes  Counseling (Intermediate counseling: > three minutes counseling) O8416    I have spent 25 minutes of face to face time with Taylor Ochoa discussing the risks and benefits of lung cancer screening. We viewed a power point together that explained in detail the above noted topics. We paused at intervals to allow for questions to be asked and answered to ensure understanding.We discussed that the single most powerful action that he can  take to decrease he risk of developing lung cancer is to quit smoking. We discussed whether or not he is ready to commit to setting a quit date. We discussed options for tools to aid in quitting smoking including nicotine replacement therapy, non-nicotine medications, support groups, Quit Smart classes, and behavior modification. We discussed that often times setting smaller, more achievable goals, such as eliminating 1 cigarette a day for a week and then 2 cigarettes a day for a week can be helpful in slowly decreasing the number of cigarettes smoked. This allows for a sense of accomplishment as well as providing a clinical benefit. I gave him the " Be Stronger Than Your Excuses" card with contact information for community resources, classes, free nicotine replacement therapy, and access to mobile apps, text messaging, and on-line smoking cessation help. I have also given him Eric Form, NP card and contact information in the event his needs to contact me. We discussed the time and location of the scan, and that either Doroteo Glassman RN or I will call with the results within 24-48 hours of receiving them. I have offered him  a copy of the power point we viewed  as a resource in the event they need reinforcement of the concepts we discussed today in the office. The patient verbalized understanding of all of  the above and had no further questions upon leaving the office. They have my contact information in the event they have any further questions.  I spent 5 minutes counseling on smoking cessation and the health risks of continued tobacco abuse.  I explained to the patient that there has been a high incidence of coronary artery disease noted on these exams. I  explained that this is a non-gated exam therefore degree or severity cannot be determined. This patient is on Crestor statin therapy. I have asked the patient to follow-up with their PCP regarding any incidental finding of coronary artery disease and  management with diet or medication as their PCP  feels is clinically indicated. The patient verbalized understanding of the above and had no further questions upon completion of the visit.   BP 130/70 (BP Location: Left Arm, Patient Position: Sitting, Cuff Size: Normal)   Pulse 61   Temp 98.6 F (37 C)   Ht 5' 8.1" (1.73 m)   Wt 156 lb 3.2 oz (70.9 kg)   SpO2 98%   BMI 23.68 kg/m     Martyn Ehrich, NP        Ordered CT chest under Eric Form, NP

## 2019-05-11 ENCOUNTER — Ambulatory Visit (INDEPENDENT_AMBULATORY_CARE_PROVIDER_SITE_OTHER)
Admission: RE | Admit: 2019-05-11 | Discharge: 2019-05-11 | Disposition: A | Discharge status: Home / Self Care | Payer: No Typology Code available for payment source | Source: Ambulatory Visit | Attending: Acute Care | Admitting: Acute Care

## 2019-05-11 ENCOUNTER — Encounter: Payer: Self-pay | Admitting: Primary Care

## 2019-05-11 ENCOUNTER — Other Ambulatory Visit: Payer: Self-pay

## 2019-05-11 ENCOUNTER — Ambulatory Visit: Payer: No Typology Code available for payment source | Admitting: Primary Care

## 2019-05-11 VITALS — BP 130/70 | HR 61 | Temp 98.6°F | Ht 68.1 in | Wt 156.2 lb

## 2019-05-11 DIAGNOSIS — F17210 Nicotine dependence, cigarettes, uncomplicated: Secondary | ICD-10-CM

## 2019-05-11 DIAGNOSIS — Z122 Encounter for screening for malignant neoplasm of respiratory organs: Secondary | ICD-10-CM

## 2019-05-11 IMAGING — CT CT CHEST LUNG CANCER SCREENING LOW DOSE
2 of 4 series · 15 of 40 positions shown, 18 images · non-contrast
Comparison: CT chest dated [DATE]

CLINICAL DATA: 62-year-old male current smoker, with 69 pack-year
history of smoking, for initial lung cancer screening.

EXAM:
CT CHEST WITHOUT CONTRAST LOW-DOSE FOR LUNG CANCER SCREENING
TECHNIQUE: Multidetector CT imaging of the chest was performed following the
standard protocol without IV contrast.

[Series 2: thorax 5.0 i31f 3 · axial · 0.67mm/px · z∈[-337,-77]mm · 12 of 62 slices shown, 15 images]
[im 5/62  mediastinal]
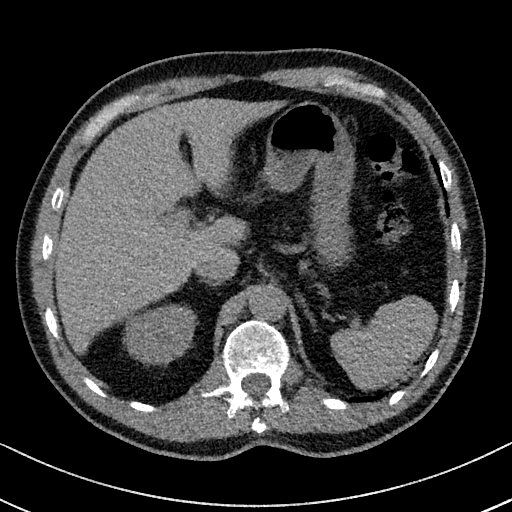
[im 5/62  lung]
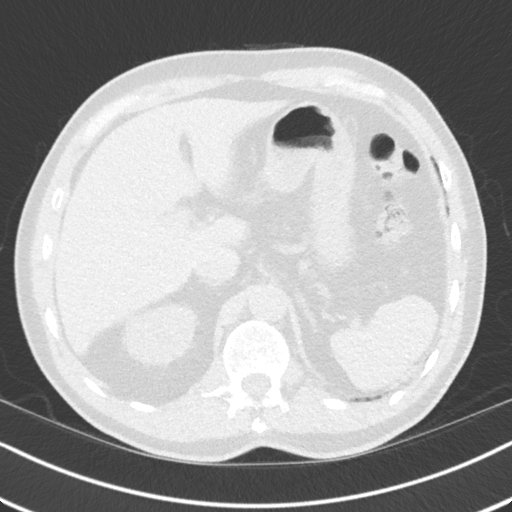
[im 10/62  lung]
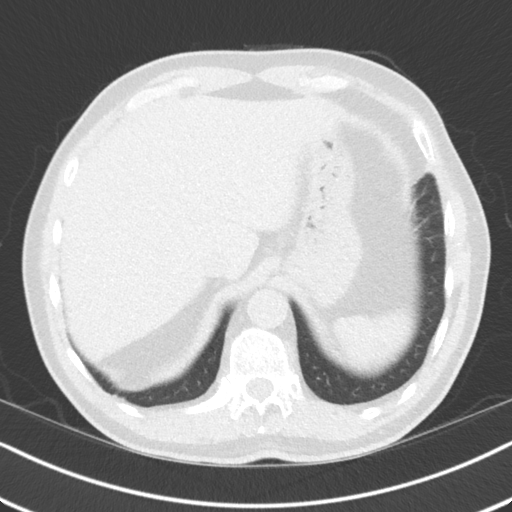
[im 15/62  lung]
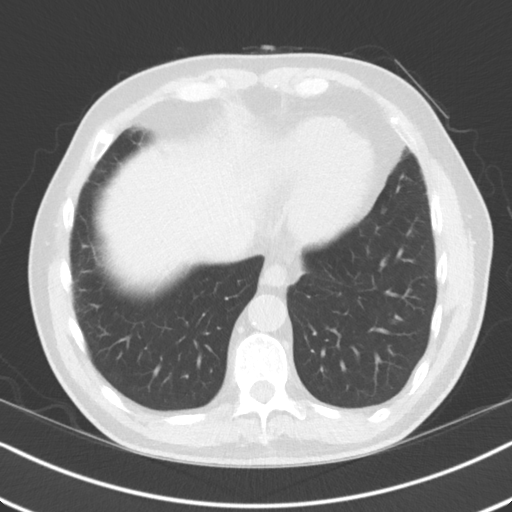
[im 19/62  lung]
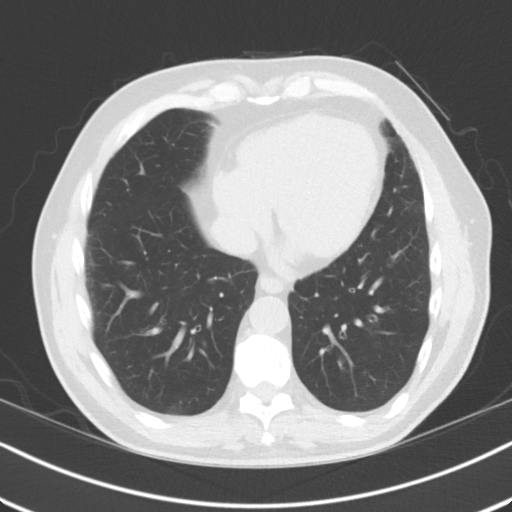
[im 24/62  mediastinal]
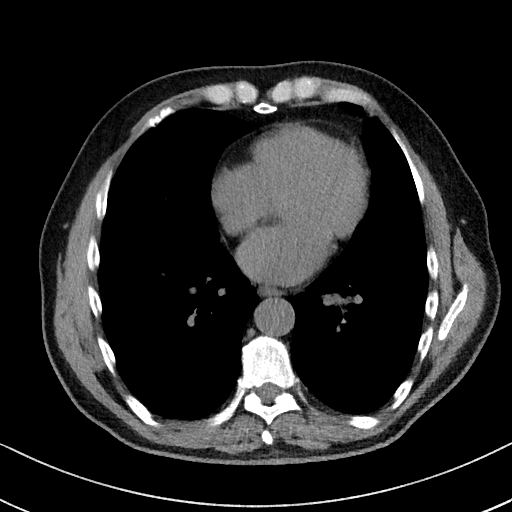
[im 24/62  lung]
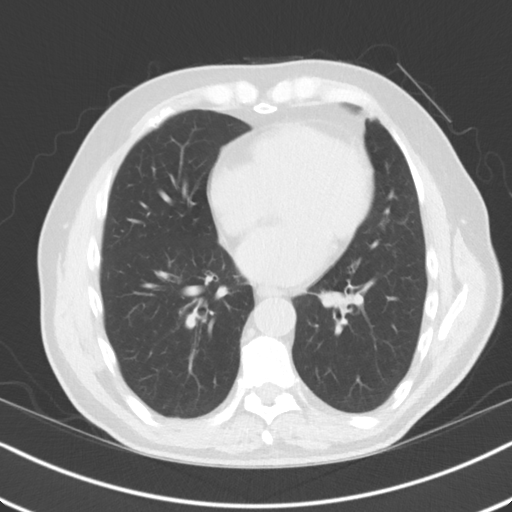
[im 29/62  lung]
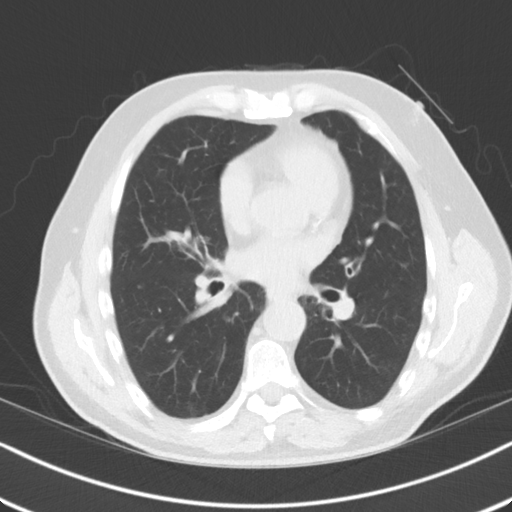
[im 33/62  lung]
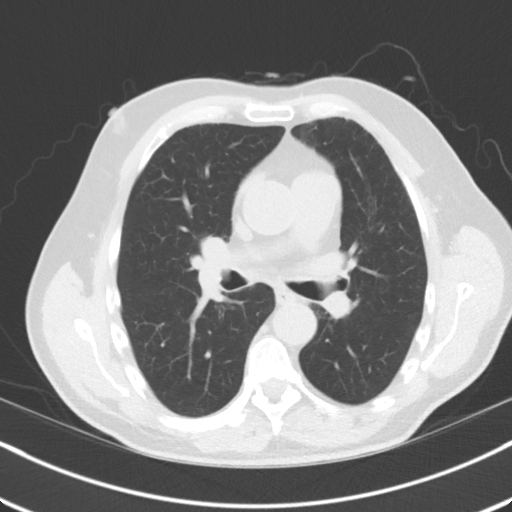
[im 38/62  lung]
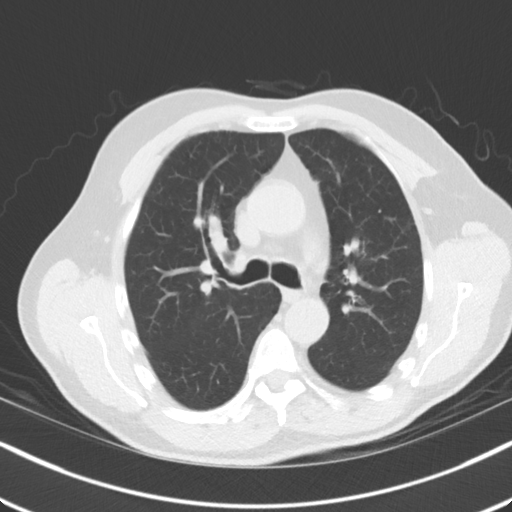
[im 43/62  mediastinal]
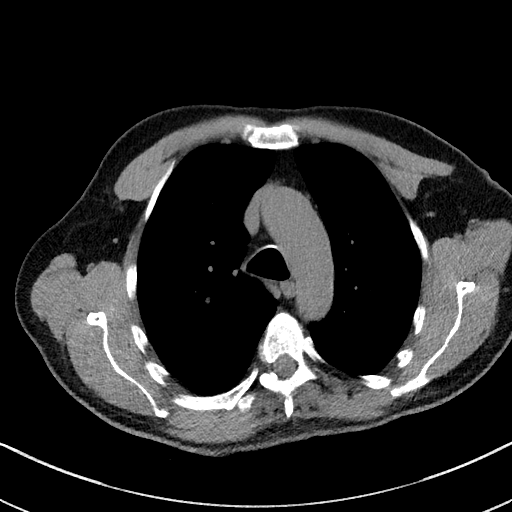
[im 43/62  lung]
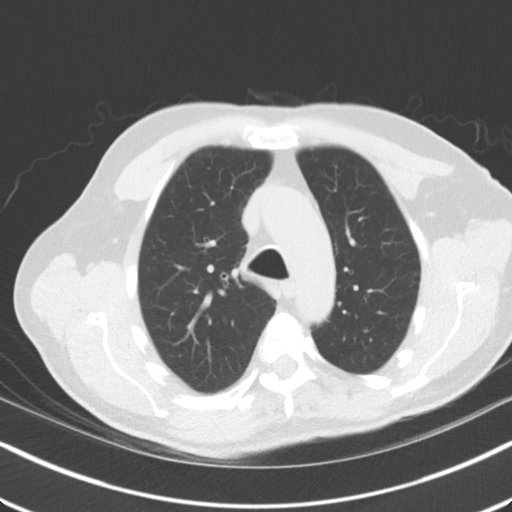
[im 47/62  lung]
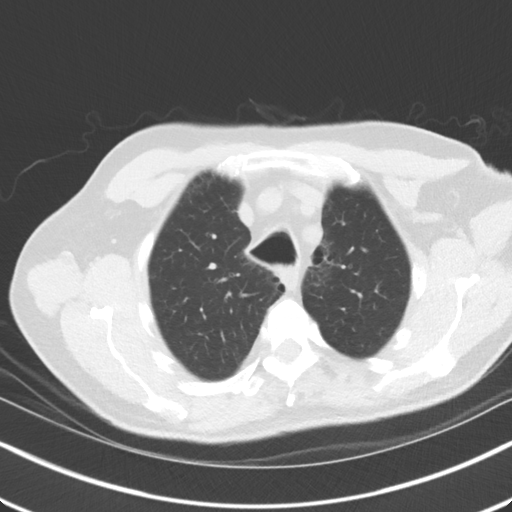
[im 52/62  lung]
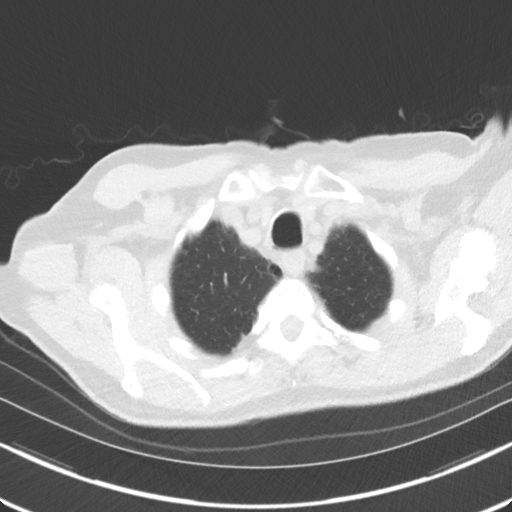
[im 57/62  lung]
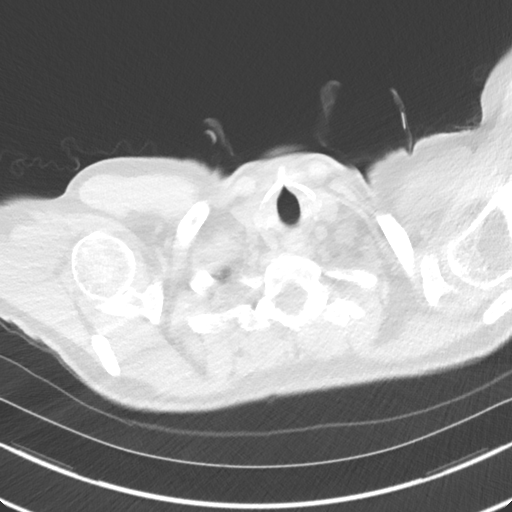

[Series 5: coronal · coronal · 0.59mm/px · 3 of 136 slices shown]
[im 28/136  lung]
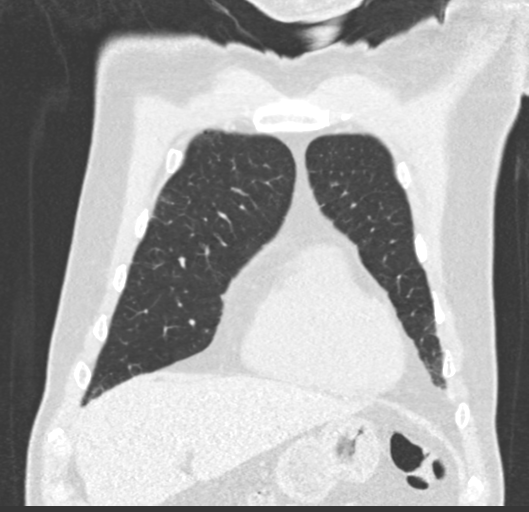
[im 55/136  lung]
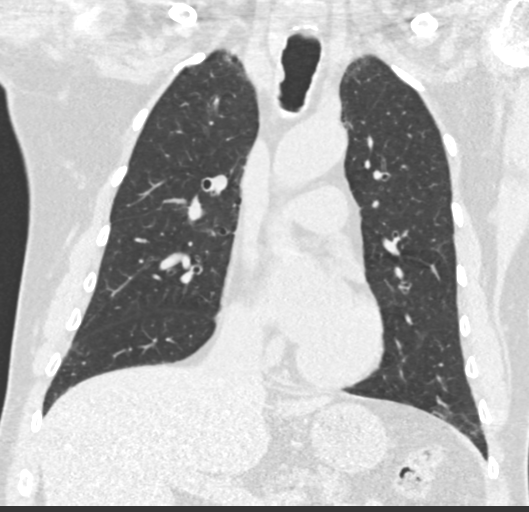
[im 82/136  lung]
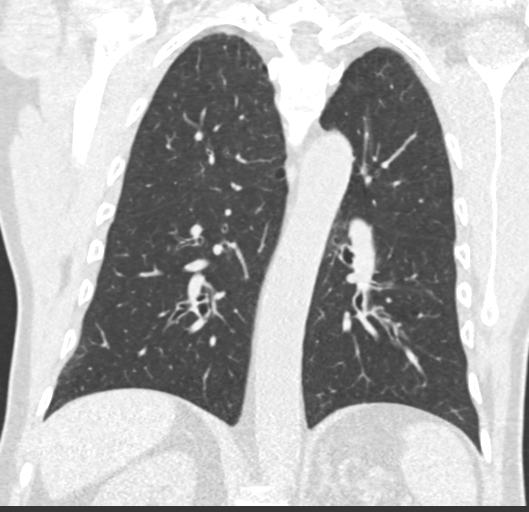

[15 of 40 positions shown; findings below may reference images not displayed]

FINDINGS: Cardiovascular: The heart is normal in size. No pericardial
effusion.

Mild ectasia of the ascending thoracic aorta, measuring 3.4 cm. Mild
atherosclerotic calcifications of the aortic arch.

Mediastinum/Nodes: No suspicious mediastinal lymphadenopathy.

Visualized thyroid is unremarkable.

Lungs/Pleura: Biapical pleural-parenchymal scarring.

Mild paraseptal emphysematous changes, upper lung predominant.

Mild bronchiectasis with bronchial wall thickening in the upper and
lower lobes.

Mild subpleural reticulation/fibrosis in the right lower lobe.

No suspicious pulmonary nodules.

No pleural effusion or pneumothorax.

Upper Abdomen: Visualized upper abdomen is grossly unremarkable,
noting vascular calcifications.

Musculoskeletal: Visualized osseous structures are within normal
limits.
IMPRESSION: Lung-RADS 1, negative. Continue annual screening with low-dose chest
CT without contrast in 12 months.

Aortic Atherosclerosis ([4Y]-[4Y]) and Emphysema ([4Y]-[4Y]).

## 2019-05-11 NOTE — Patient Instructions (Addendum)
Taylor Ochoa Phone- [phone] Taylor Ochoa   Thank you for participating in the [facility] Screening Program. It was our pleasure to meet you today. We will call you with the results of your scan within the next few days. Your scan will be assigned a Lung RADS category score by the physicians reading the scans.  This Lung RADS score determines follow up scanning.  See below for description of categories, and follow up screening recommendations. We will be in touch to schedule your follow up screening annually or based on recommendations of our providers. We will fax a copy of your scan results to your Primary Care Physician, or the physician who referred you to the program, to ensure they have the results. Please call the office if you have any questions or concerns regarding your scanning experience or results.  Our office number is [phone]. Please speak with Doroteo Glassman, RN. She is our Lung Cancer Lobbyist. If she is unavailable when you call, please have the office staff send her a message. She will return your call at her earliest convenience. Remember, if your scan is normal, we will scan you annually as long as you continue to meet the criteria for the program. (Age 23-77, Current smoker or smoker who has quit within the last 15 years). If you are a smoker, remember, quitting is the single most powerful action that you can take to decrease your risk of lung cancer and other pulmonary, breathing related problems. We know quitting is hard, and we are here to help.  Please let us know if there is anything we can do to help you meet your goal of quitting. If you are a former smoker, Medical sales representative. We are proud of you! Remain smoke free! Remember you can refer friends or family members through the number above.  We will screen them to make sure they meet criteria for the program. Thank you for helping [place] take better care of you by  participating in Lung Screening.  Lung RADS Categories:  Lung RADS 1: no nodules or definitely non-concerning nodules.  Recommendation is for a repeat annual scan in 12 months.  Lung RADS 2:  nodules that are non-concerning in appearance and behavior with a very low likelihood of becoming an active cancer. Recommendation is for a repeat annual scan in 12 months.  Lung RADS 3: nodules that are probably non-concerning , includes nodules with a low likelihood of becoming an active cancer.  Recommendation is for a 61-month repeat screening scan. Often noted after an upper respiratory illness. We will be in touch to make sure you have no questions, and to schedule your 80-month scan.  Lung RADS 4 A: nodules with concerning findings, recommendation is most often for a follow up scan in 3 months or additional testing based on our provider's assessment of the scan. We will be in touch to make sure you have no questions and to schedule the recommended 3 month follow up scan.  Lung RADS 4 B:  indicates findings that are concerning. We will be in touch with you to schedule additional diagnostic testing based on our provider's  assessment of the scan.

## 2019-05-13 ENCOUNTER — Encounter: Payer: Self-pay | Admitting: Internal Medicine

## 2019-05-13 ENCOUNTER — Telehealth: Payer: Self-pay | Admitting: Internal Medicine

## 2019-05-13 DIAGNOSIS — F411 Generalized anxiety disorder: Secondary | ICD-10-CM

## 2019-05-13 MED ORDER — SERTRALINE HCL 25 MG PO TABS: 25.0000 mg | ORAL_TABLET | Freq: Every day | ORAL | 1 refills | Status: DC

## 2019-05-13 MED FILL — GABAPENTIN 300 MG CAPSULE: 300 | 60 days supply | Qty: 180 | Fill #1

## 2019-05-13 MED FILL — PANTOPRAZOLE SOD DR 40 MG T: 40 | 90 days supply | Qty: 90 | Fill #0

## 2019-05-13 NOTE — Telephone Encounter (Signed)
erx has been resent.

## 2019-05-13 NOTE — Telephone Encounter (Signed)
The class on the Rx sertraline (ZOLOFT) 25 MG tablet  says print so the Rx didn't go through electronically and needs to be resent to pharmacy/ please advise

## 2019-05-14 ENCOUNTER — Other Ambulatory Visit: Payer: Self-pay | Admitting: *Deleted

## 2019-05-14 DIAGNOSIS — F17210 Nicotine dependence, cigarettes, uncomplicated: Secondary | ICD-10-CM

## 2019-05-14 DIAGNOSIS — Z122 Encounter for screening for malignant neoplasm of respiratory organs: Secondary | ICD-10-CM

## 2019-05-27 ENCOUNTER — Encounter: Payer: Self-pay | Admitting: Podiatry

## 2019-05-27 ENCOUNTER — Other Ambulatory Visit: Payer: Self-pay

## 2019-05-27 ENCOUNTER — Ambulatory Visit (INDEPENDENT_AMBULATORY_CARE_PROVIDER_SITE_OTHER): Payer: No Typology Code available for payment source | Admitting: Podiatry

## 2019-05-27 VITALS — BP 129/80 | HR 78

## 2019-05-27 DIAGNOSIS — M79676 Pain in unspecified toe(s): Secondary | ICD-10-CM

## 2019-05-27 DIAGNOSIS — Q828 Other specified congenital malformations of skin: Secondary | ICD-10-CM

## 2019-05-27 DIAGNOSIS — B351 Tinea unguium: Secondary | ICD-10-CM | POA: Diagnosis not present
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
## 2019-06-22 MED FILL — SERTRALINE HCL 25 MG TABLET: 25 | 90 days supply | Qty: 90 | Fill #0

## 2019-07-05 MED FILL — ROSUVASTATIN CALCIUM 20 MG: 20 | 90 days supply | Qty: 90 | Fill #1

## 2019-07-12 ENCOUNTER — Telehealth: Payer: Self-pay | Admitting: *Deleted

## 2019-07-12 MED ORDER — GABAPENTIN 300 MG PO CAPS: 300.0000 mg | ORAL_CAPSULE | Freq: Three times a day (TID) | ORAL | 0 refills | Status: DC

## 2019-07-12 MED FILL — GABAPENTIN 300 MG CAPSULE: 300 | 30 days supply | Qty: 90 | Fill #0

## 2019-07-12 NOTE — Telephone Encounter (Signed)
Please advise in PCP's absence. Ok to refill gabapentin? It has never been prescribed by Dr. Ronnald Ramp.   LOV: 04/08/19 Next appt: 09/24/2019

## 2019-07-12 NOTE — Telephone Encounter (Signed)
Per patient's wife- pt takes gabapentin 300 mg tid. Please send refill to [facility] outpatient.

## 2019-07-12 NOTE — Telephone Encounter (Signed)
Copied from [place] [id number]. Topic: Quick Communication - Rx Refill/Question >> Jul 12, 2019 12:56 PM Carolyn Stare wrote: Medication gabapentin (NEURONTIN) 300 MG capsule  Preferred Pharmacy  [facility]  Agent: Please be advised that RX refills may take up to 3 business days. We ask that you follow-up with your pharmacy. >> Jul 12, 2019  1:09 PM Rainey Pines A wrote: Patients wife would like a callback once medication has been sent to pharmacy. Patient is completely out of medication. Best contact number [phone]

## 2019-07-12 NOTE — Telephone Encounter (Signed)
Sent in

## 2019-07-12 NOTE — Telephone Encounter (Signed)
Can we verify sig and dosing. Then I will send in for him.

## 2019-07-15 ENCOUNTER — Encounter: Payer: Self-pay | Admitting: Internal Medicine

## 2019-07-15 MED FILL — FLUARIX QUADRIVALENT 0.5 ML: 0.5 | 1 days supply | Qty: 1 | Fill #0

## 2019-08-18 ENCOUNTER — Other Ambulatory Visit: Payer: Self-pay | Admitting: Internal Medicine

## 2019-08-18 MED FILL — GABAPENTIN 300 MG CAPSULE: 300 | 30 days supply | Qty: 90 | Fill #0

## 2019-08-26 ENCOUNTER — Ambulatory Visit: Payer: No Typology Code available for payment source | Admitting: Podiatry

## 2019-09-01 ENCOUNTER — Ambulatory Visit: Payer: No Typology Code available for payment source | Admitting: Podiatry

## 2019-09-15 ENCOUNTER — Other Ambulatory Visit: Payer: Self-pay | Admitting: Internal Medicine

## 2019-09-15 DIAGNOSIS — E785 Hyperlipidemia, unspecified: Secondary | ICD-10-CM

## 2019-09-15 MED FILL — GABAPENTIN 300 MG CAPSULE: 300 | 30 days supply | Qty: 90 | Fill #1

## 2019-09-22 ENCOUNTER — Encounter: Payer: Self-pay | Admitting: Internal Medicine

## 2019-09-22 ENCOUNTER — Other Ambulatory Visit: Payer: Self-pay | Admitting: Internal Medicine

## 2019-09-22 DIAGNOSIS — E785 Hyperlipidemia, unspecified: Secondary | ICD-10-CM

## 2019-09-22 MED ORDER — ROSUVASTATIN CALCIUM 20 MG PO TABS: 20.0000 mg | ORAL_TABLET | Freq: Every day | ORAL | 1 refills | Status: DC

## 2019-09-22 MED ORDER — PANTOPRAZOLE SODIUM 40 MG PO TBEC: 40.0000 mg | DELAYED_RELEASE_TABLET | Freq: Every day | ORAL | 1 refills | Status: DC

## 2019-09-22 MED FILL — ROSUVASTATIN CALCIUM 20 MG: 20 | 90 days supply | Qty: 90 | Fill #0

## 2019-09-22 MED FILL — PANTOPRAZOLE SOD DR 40 MG T: 40 | 90 days supply | Qty: 90 | Fill #0

## 2019-10-12 ENCOUNTER — Other Ambulatory Visit: Payer: Self-pay

## 2019-10-12 ENCOUNTER — Encounter: Payer: Self-pay | Admitting: Internal Medicine

## 2019-10-12 ENCOUNTER — Ambulatory Visit (INDEPENDENT_AMBULATORY_CARE_PROVIDER_SITE_OTHER): Payer: No Typology Code available for payment source | Admitting: Internal Medicine

## 2019-10-12 VITALS — BP 120/78 | HR 67 | Temp 98.0°F | Ht 68.0 in | Wt 162.4 lb

## 2019-10-12 DIAGNOSIS — F411 Generalized anxiety disorder: Secondary | ICD-10-CM

## 2019-10-12 DIAGNOSIS — J411 Mucopurulent chronic bronchitis: Secondary | ICD-10-CM

## 2019-10-12 DIAGNOSIS — E785 Hyperlipidemia, unspecified: Secondary | ICD-10-CM

## 2019-10-12 DIAGNOSIS — K219 Gastro-esophageal reflux disease without esophagitis: Secondary | ICD-10-CM

## 2019-10-12 DIAGNOSIS — R7303 Prediabetes: Secondary | ICD-10-CM | POA: Diagnosis not present

## 2019-10-12 DIAGNOSIS — Z1159 Encounter for screening for other viral diseases: Secondary | ICD-10-CM | POA: Diagnosis not present

## 2019-10-12 LAB — CBC WITH DIFFERENTIAL/PLATELET
Basophils Absolute: 0 10*3/uL (ref 0.0–0.1)
Basophils Relative: 0.4 % (ref 0.0–3.0)
Eosinophils Absolute: 0 10*3/uL (ref 0.0–0.7)
Eosinophils Relative: 0.7 % (ref 0.0–5.0)
HCT: 41.4 % (ref 39.0–52.0)
Hemoglobin: 13.7 g/dL (ref 13.0–17.0)
Lymphocytes Relative: 24.2 % (ref 12.0–46.0)
Lymphs Abs: 1.7 10*3/uL (ref 0.7–4.0)
MCHC: 33.2 g/dL (ref 30.0–36.0)
MCV: 96.4 fl (ref 78.0–100.0)
Monocytes Absolute: 0.5 10*3/uL (ref 0.1–1.0)
Monocytes Relative: 7.2 % (ref 3.0–12.0)
Neutro Abs: 4.8 10*3/uL (ref 1.4–7.7)
Neutrophils Relative %: 67.5 % (ref 43.0–77.0)
Platelets: 206 10*3/uL (ref 150.0–400.0)
RBC: 4.3 Mil/uL (ref 4.22–5.81)
RDW: 13.7 % (ref 11.5–15.5)
WBC: 7.1 10*3/uL (ref 4.0–10.5)

## 2019-10-12 LAB — LIPID PANEL
Cholesterol: 108 mg/dL (ref 0–200)
HDL: 43.6 mg/dL (ref >39.00)
LDL Cholesterol: 56 mg/dL (ref 0–99)
NonHDL: 64.32
Total CHOL/HDL Ratio: 2
Triglycerides: 40 mg/dL (ref 0.0–149.0)
VLDL: 8 mg/dL (ref 0.0–40.0)

## 2019-10-12 LAB — HEPATIC FUNCTION PANEL
ALT: 18 U/L (ref 0–53)
AST: 21 U/L (ref 0–37)
Albumin: 4.2 g/dL (ref 3.5–5.2)
Alkaline Phosphatase: 64 U/L (ref 39–117)
Bilirubin, Direct: 0.2 mg/dL (ref 0.0–0.3)
Total Bilirubin: 0.6 mg/dL (ref 0.2–1.2)
Total Protein: 6.8 g/dL (ref 6.0–8.3)

## 2019-10-12 LAB — HEMOGLOBIN A1C: Hgb A1c MFr Bld: 6.1 % (ref 4.6–6.5)

## 2019-10-12 MED ORDER — SPIRIVA RESPIMAT 1.25 MCG/ACT IN AERS: 2.0000 | INHALATION_SPRAY | Freq: Every day | RESPIRATORY_TRACT | 1 refills | Status: DC

## 2019-10-12 MED ORDER — SERTRALINE HCL 100 MG PO TABS: 100.0000 mg | ORAL_TABLET | Freq: Every day | ORAL | 1 refills | Status: DC

## 2019-10-12 MED FILL — GABAPENTIN 300 MG CAPSULE: 300 | 30 days supply | Qty: 90 | Fill #0

## 2019-10-12 MED FILL — SERTRALINE HCL 100 MG TAB: 100 | 90 days supply | Qty: 90 | Fill #0

## 2019-10-12 MED FILL — SPIRIVA RESPIMAT 1.25 MCG I: 1.25 | 30 days supply | Qty: 4 | Fill #0

## 2019-10-12 NOTE — Progress Notes (Signed)
Subjective:  Patient ID: Taylor Ochoa, male    DOB: 27-Jun-1956  Age: 64 y.o. MRN: [id number]  CC: COPD  This visit occurred during the SARS-CoV-2 public health emergency.  Safety protocols were in place, including screening questions prior to the visit, additional usage of staff PPE, and extensive cleaning of exam room while observing appropriate contact time as indicated for disinfecting solutions.    HPI Taylor Ochoa presents for f/up - He can complains of persistent cough and dyspnea on exertion.  He says the cough is only productive of phlegm in the morning and its brown and thick.  He states for the rest of the day the cough is nonproductive.  He says he is ready to start using an inhaler for this.  He denies chest pain, hemoptysis, wheezing, or weight loss.  He continues to complain of anxiety and wants to know if he can increase the dose of sertraline.  He complains of weight gain.  Outpatient Medications Prior to Visit  Medication Sig Dispense Refill  . acetaminophen (TYLENOL) 500 MG tablet Take 500 mg by mouth every 6 (six) hours as needed.    . gabapentin (NEURONTIN) 300 MG capsule TAKE 1 CAPSULE (300 MG TOTAL) BY MOUTH 3 TIMES A DAY 90 capsule 3  . pantoprazole (PROTONIX) 40 MG tablet Take 1 tablet (40 mg total) by mouth daily. 90 tablet 1  . rosuvastatin (CRESTOR) 20 MG tablet Take 1 tablet (20 mg total) by mouth daily. 90 tablet 1  . sertraline (ZOLOFT) 25 MG tablet Take 1 tablet (25 mg total) by mouth daily. 90 tablet 1   No facility-administered medications prior to visit.    ROS Review of Systems  Constitutional: Positive for unexpected weight change (wt gain). Negative for appetite change, chills, diaphoresis and fatigue.  HENT: Negative.  Negative for sore throat and trouble swallowing.   Eyes: Negative.   Respiratory: Positive for cough and shortness of breath. Negative for chest tightness, wheezing and stridor.   Cardiovascular: Negative for chest pain,  palpitations and leg swelling.  Gastrointestinal: Negative for abdominal pain, diarrhea, nausea and vomiting.  Endocrine: Negative.   Genitourinary: Negative.  Negative for difficulty urinating.  Musculoskeletal: Negative.  Negative for myalgias.  Skin: Negative.  Negative for color change and pallor.  Neurological: Negative.  Negative for dizziness, weakness, light-headedness and headaches.  Hematological: Negative for adenopathy. Does not bruise/bleed easily.  Psychiatric/Behavioral: Negative.     Objective:  BP 120/78 (BP Location: Left Arm, Patient Position: Sitting, Cuff Size: Normal)   Pulse 67   Temp 98 F (36.7 C) (Oral)   Ht 5\' 8"  (1.727 m)   Wt 162 lb 6.4 oz (73.7 kg)   SpO2 98%   BMI 24.69 kg/m   BP Readings from Last 3 Encounters:  10/12/19 120/78  05/27/19 129/80  05/11/19 130/70    Wt Readings from Last 3 Encounters:  10/12/19 162 lb 6.4 oz (73.7 kg)  05/11/19 156 lb 3.2 oz (70.9 kg)  04/08/19 162 lb 8 oz (73.7 kg)    Physical Exam Vitals reviewed.  Constitutional:      Appearance: Normal appearance.  HENT:     Nose: Nose normal.     Mouth/Throat:     Mouth: Mucous membranes are moist.  Eyes:     General: No scleral icterus.    Conjunctiva/sclera: Conjunctivae normal.  Cardiovascular:     Rate and Rhythm: Normal rate and regular rhythm.     Heart sounds: No murmur.  Pulmonary:  Effort: Pulmonary effort is normal. No tachypnea, accessory muscle usage or respiratory distress.     Breath sounds: No stridor. Examination of the right-lower field reveals decreased breath sounds and rhonchi. Examination of the left-lower field reveals decreased breath sounds and rhonchi. Decreased breath sounds and rhonchi present. No wheezing or rales.  Abdominal:     General: Abdomen is flat. Bowel sounds are normal. There is no distension.     Palpations: Abdomen is soft. There is no hepatomegaly or splenomegaly.     Tenderness: There is no abdominal tenderness.   Musculoskeletal:        General: Normal range of motion.     Cervical back: Neck supple.     Right lower leg: No edema.     Left lower leg: No edema.  Lymphadenopathy:     Cervical: No cervical adenopathy.  Skin:    General: Skin is warm and dry.  Neurological:     General: No focal deficit present.     Mental Status: He is alert and oriented to person, place, and time. Mental status is at baseline.     Lab Results  Component Value Date   WBC 7.1 10/12/2019   HGB 13.7 10/12/2019   HCT 41.4 10/12/2019   PLT 206.0 10/12/2019   GLUCOSE 93 04/08/2019   CHOL 108 10/12/2019   TRIG 40.0 10/12/2019   HDL 43.60 10/12/2019   LDLCALC 56 10/12/2019   ALT 18 10/12/2019   AST 21 10/12/2019   NA 138 04/08/2019   K 4.1 04/08/2019   CL 104 04/08/2019   CREATININE 0.98 04/08/2019   BUN 20 04/08/2019   CO2 27 04/08/2019   TSH 1.55 04/08/2019   PSA 2.66 04/08/2019   INR 1.13 01/13/2018   HGBA1C 6.1 10/12/2019    CT CHEST LUNG CA SCREEN LOW DOSE W/O CM  Result Date: 05/12/2019 CLINICAL DATA:  64 year old male current smoker, with 60 pack-year history of smoking, for initial lung cancer screening. EXAM: CT CHEST WITHOUT CONTRAST LOW-DOSE FOR LUNG CANCER SCREENING TECHNIQUE: Multidetector CT imaging of the chest was performed following the standard protocol without IV contrast. COMPARISON:  CT chest dated 01/13/2018 FINDINGS: Cardiovascular: The heart is normal in size. No pericardial effusion. Mild ectasia of the ascending thoracic aorta, measuring 3.4 cm. Mild atherosclerotic calcifications of the aortic arch. Mediastinum/Nodes: No suspicious mediastinal lymphadenopathy. Visualized thyroid is unremarkable. Lungs/Pleura: Biapical pleural-parenchymal scarring. Mild paraseptal emphysematous changes, upper lung predominant. Mild bronchiectasis with bronchial wall thickening in the upper and lower lobes. Mild subpleural reticulation/fibrosis in the right lower lobe. No suspicious pulmonary nodules.  No pleural effusion or pneumothorax. Upper Abdomen: Visualized upper abdomen is grossly unremarkable, noting vascular calcifications. Musculoskeletal: Visualized osseous structures are within normal limits. IMPRESSION: Lung-RADS 1, negative. Continue annual screening with low-dose chest CT without contrast in 12 months. Aortic Atherosclerosis (ICD10-I70.0) and Emphysema (ICD10-J43.9). Electronically Signed   By: Julian Hy M.D.   On: 05/12/2019 08:18    Assessment & Plan:   Dacari was seen today for copd.  Diagnoses and all orders for this visit:  Mucopurulent chronic bronchitis ([place])- I recommended he start using a LAMA for this. -     Tiotropium Bromide Monohydrate (SPIRIVA RESPIMAT) 1.25 MCG/ACT AERS; Inhale 2 puffs into the lungs daily.  GAD (generalized anxiety disorder)- Will increase the dose of sertraline. -     sertraline (ZOLOFT) 100 MG tablet; Take 1 tablet (100 mg total) by mouth daily.  Prediabetes- His A1c is at 6.1%.  Medical therapy is  not indicated. -     Hemoglobin A1c  Need for hepatitis C screening test- His hep C antibody test is negative. -     Hepatitis C antibody  Gastroesophageal reflux disease, unspecified whether esophagitis present- No complications or alarming features noted.  Will continue the PPI. -     CBC with Differential/Platelet  Hyperlipidemia with target LDL less than 130 - He has achieved his LDL goal and is doing well on the statin. -     Lipid panel -     Hepatic function panel   I have discontinued Kaelem L. Roam's sertraline. I am also having him start on Spiriva Respimat and sertraline. Additionally, I am having him maintain his acetaminophen, gabapentin, pantoprazole, and rosuvastatin.  Meds ordered this encounter  Medications  . Tiotropium Bromide Monohydrate (SPIRIVA RESPIMAT) 1.25 MCG/ACT AERS    Sig: Inhale 2 puffs into the lungs daily.    Dispense:  12 g    Refill:  1  . sertraline (ZOLOFT) 100 MG tablet    Sig: Take 1  tablet (100 mg total) by mouth daily.    Dispense:  90 tablet    Refill:  1     Follow-up: Return in about 3 months (around 01/10/2020).  Scarlette Calico, MD

## 2019-10-12 NOTE — Patient Instructions (Signed)

## 2019-10-13 ENCOUNTER — Encounter: Payer: Self-pay | Admitting: Internal Medicine

## 2019-10-13 LAB — HEPATITIS C ANTIBODY
Hepatitis C Ab: NONREACTIVE
SIGNAL TO CUT-OFF: 0.01 (ref <1.00)

## 2019-11-22 MED FILL — SPIRIVA RESPIMAT 1.25 MCG I: 1.25 | 30 days supply | Qty: 4 | Fill #0

## 2019-11-22 MED FILL — GABAPENTIN 300 MG CAPSULE: 300 | 30 days supply | Qty: 90 | Fill #0

## 2019-11-23 ENCOUNTER — Other Ambulatory Visit: Payer: Self-pay

## 2019-11-23 ENCOUNTER — Ambulatory Visit (INDEPENDENT_AMBULATORY_CARE_PROVIDER_SITE_OTHER): Payer: No Typology Code available for payment source | Admitting: Podiatry

## 2019-11-23 ENCOUNTER — Encounter: Payer: Self-pay | Admitting: Podiatry

## 2019-11-23 VITALS — Temp 98.4°F

## 2019-11-23 DIAGNOSIS — B351 Tinea unguium: Secondary | ICD-10-CM

## 2019-11-23 DIAGNOSIS — M79672 Pain in left foot: Secondary | ICD-10-CM | POA: Diagnosis not present

## 2019-11-23 DIAGNOSIS — Q828 Other specified congenital malformations of skin: Secondary | ICD-10-CM

## 2019-11-23 DIAGNOSIS — M79676 Pain in unspecified toe(s): Secondary | ICD-10-CM

## 2019-11-23 NOTE — Patient Instructions (Signed)

## 2019-11-28 NOTE — Progress Notes (Signed)
Subjective: Taylor Ochoa presents today for follow up of painful porokeratotic lesion(s) left foot and painful mycotic toenails b/l that limit ambulation. Aggravating factors include weightbearing with and without shoe gear. Pain for both is relieved with periodic professional debridement.   He states his lesions are very tender today. He voices no new pedal concerns on today's visit.  No Known Allergies   Objective: Vitals:   11/23/19 1051  Temp: 98.4 F (36.9 C)    Vascular Examination:  Capillary refill time to digits immediate b/l. Palpable DP pulses b/l. Palpable PT pulses b/l. Pedal hair sparse b/l. Skin temperature gradient within normal limits b/l.  Dermatological Examination: Pedal skin with normal turgor, texture and tone bilaterally. No open wounds bilaterally. No interdigital macerations bilaterally. Toenails 1-5 b/l elongated, dystrophic, thickened, crumbly with subungual debris and tenderness to dorsal palpation. Porokeratotic lesion(s) submet head 1 left and left heel with tenderness to palpation. No erythema, no edema, no drainage, no flocculence.  Musculoskeletal: Normal muscle strength 5/5 to all lower extremity muscle groups bilaterally, no gross bony deformities bilaterally, no pain crepitus or joint limitation noted with ROM b/l and patient ambulates independent of any assistive aids  Neurological: Protective sensation intact 5/5 intact bilaterally with 10g monofilament b/l Vibratory sensation intact b/l  Assessment: 1. Pain due to onychomycosis of toenail   2. Porokeratosis   3. Left foot pain    Plan: -Toenails 1-5 b/l were debrided in length and girth with sterile nail nippers and dremel without iatrogenic bleeding.  -Painful porokeratotic lesions submet head 1 left and left heel pared and enucleated with sterile scalpel blade without incident. -Patient to continue soft, supportive shoe gear daily. -Patient to report any pedal injuries to medical  professional immediately. -Patient/POA to call should there be question/concern in the interim.  Return if symptoms worsen or fail to improve, for nail trim.

## 2019-12-16 ENCOUNTER — Other Ambulatory Visit: Payer: Self-pay | Admitting: Internal Medicine

## 2019-12-16 MED FILL — PANTOPRAZOLE SOD DR 40 MG T: 40 | 90 days supply | Qty: 90 | Fill #1

## 2019-12-16 MED FILL — ROSUVASTATIN CALCIUM 20 MG: 20 | 90 days supply | Qty: 90 | Fill #1

## 2019-12-22 MED FILL — GABAPENTIN 300 MG CAPSULE: 300 | 30 days supply | Qty: 90 | Fill #0

## 2019-12-27 MED FILL — SERTRALINE HCL 100 MG TAB: 100 | 90 days supply | Qty: 90 | Fill #0

## 2020-01-11 ENCOUNTER — Encounter: Payer: Self-pay | Admitting: Internal Medicine

## 2020-01-11 ENCOUNTER — Ambulatory Visit (INDEPENDENT_AMBULATORY_CARE_PROVIDER_SITE_OTHER): Payer: No Typology Code available for payment source | Admitting: Internal Medicine

## 2020-01-11 ENCOUNTER — Other Ambulatory Visit: Payer: Self-pay

## 2020-01-11 VITALS — BP 130/74 | HR 67 | Temp 98.3°F | Resp 16 | Ht 68.0 in | Wt 163.4 lb

## 2020-01-11 DIAGNOSIS — J411 Mucopurulent chronic bronchitis: Secondary | ICD-10-CM | POA: Diagnosis not present

## 2020-01-11 DIAGNOSIS — E785 Hyperlipidemia, unspecified: Secondary | ICD-10-CM

## 2020-01-11 NOTE — Patient Instructions (Signed)

## 2020-01-11 NOTE — Progress Notes (Signed)
Subjective:  Patient ID: Taylor Ochoa, male    DOB: 20-Apr-1956  Age: 64 y.o. MRN: [id number]  CC: Hyperlipidemia and COPD  This visit occurred during the SARS-CoV-2 public health emergency.  Safety protocols were in place, including screening questions prior to the visit, additional usage of staff PPE, and extensive cleaning of exam room while observing appropriate contact time as indicated for disinfecting solutions.    HPI AIME BABBS presents for f/up - He feels better since using the LAMA inhaler. He has rare cough productive of white phlegm. He denies CP, diaphoresis, F/C, hemoptysis.  Outpatient Medications Prior to Visit  Medication Sig Dispense Refill  . acetaminophen (TYLENOL) 500 MG tablet Take 500 mg by mouth every 6 (six) hours as needed.    . gabapentin (NEURONTIN) 300 MG capsule TAKE 1 CAPSULE (300 MG TOTAL) BY MOUTH 3 TIMES A DAY 90 capsule 5  . pantoprazole (PROTONIX) 40 MG tablet Take 1 tablet (40 mg total) by mouth daily. 90 tablet 1  . rosuvastatin (CRESTOR) 20 MG tablet Take 1 tablet (20 mg total) by mouth daily. 90 tablet 1  . sertraline (ZOLOFT) 100 MG tablet Take 1 tablet (100 mg total) by mouth daily. 90 tablet 1  . Tiotropium Bromide Monohydrate (SPIRIVA RESPIMAT) 1.25 MCG/ACT AERS Inhale 2 puffs into the lungs daily. 12 g 1   No facility-administered medications prior to visit.    ROS Review of Systems  Constitutional: Negative for chills, diaphoresis, fatigue and fever.  HENT: Negative.   Eyes: Negative.   Respiratory: Positive for cough. Negative for chest tightness, shortness of breath and wheezing.   Cardiovascular: Negative for chest pain, palpitations and leg swelling.  Gastrointestinal: Negative for abdominal pain, constipation, diarrhea, nausea and vomiting.  Endocrine: Negative.   Genitourinary: Negative.  Negative for difficulty urinating and dysuria.  Musculoskeletal: Negative.  Negative for arthralgias and myalgias.  Skin: Negative.   Negative for color change.  Neurological: Negative.  Negative for dizziness, weakness and light-headedness.  Hematological: Negative for adenopathy. Does not bruise/bleed easily.  Psychiatric/Behavioral: Negative.     Objective:  BP 130/74 (BP Location: Left Arm, Patient Position: Sitting, Cuff Size: Normal)   Pulse 67   Temp 98.3 F (36.8 C) (Oral)   Resp 16   Ht 5\' 8"  (1.727 m)   Wt 163 lb 6.4 oz (74.1 kg)   SpO2 97%   BMI 24.84 kg/m   BP Readings from Last 3 Encounters:  01/11/20 130/74  10/12/19 120/78  05/27/19 129/80    Wt Readings from Last 3 Encounters:  01/11/20 163 lb 6.4 oz (74.1 kg)  10/12/19 162 lb 6.4 oz (73.7 kg)  05/11/19 156 lb 3.2 oz (70.9 kg)    Physical Exam Vitals reviewed.  Constitutional:      Appearance: Normal appearance. He is not ill-appearing.  HENT:     Nose: Nose normal.     Mouth/Throat:     Mouth: Mucous membranes are moist.  Eyes:     General: No scleral icterus.    Conjunctiva/sclera: Conjunctivae normal.  Cardiovascular:     Rate and Rhythm: Normal rate and regular rhythm.     Heart sounds: No murmur.  Pulmonary:     Breath sounds: No stridor. No wheezing, rhonchi or rales.  Abdominal:     General: Abdomen is flat.     Palpations: There is no mass.     Tenderness: There is no abdominal tenderness. There is no guarding.  Musculoskeletal:  General: Normal range of motion.     Cervical back: Neck supple.     Right lower leg: No edema.     Left lower leg: No edema.  Lymphadenopathy:     Cervical: No cervical adenopathy.  Skin:    General: Skin is warm and dry.  Neurological:     General: No focal deficit present.     Mental Status: He is alert.  Psychiatric:        Mood and Affect: Mood normal.        Behavior: Behavior normal.     Lab Results  Component Value Date   WBC 7.1 10/12/2019   HGB 13.7 10/12/2019   HCT 41.4 10/12/2019   PLT 206.0 10/12/2019   GLUCOSE 93 04/08/2019   CHOL 108 10/12/2019   TRIG  40.0 10/12/2019   HDL 43.60 10/12/2019   LDLCALC 56 10/12/2019   ALT 18 10/12/2019   AST 21 10/12/2019   NA 138 04/08/2019   K 4.1 04/08/2019   CL 104 04/08/2019   CREATININE 0.98 04/08/2019   BUN 20 04/08/2019   CO2 27 04/08/2019   TSH 1.55 04/08/2019   PSA 2.66 04/08/2019   INR 1.13 01/13/2018   HGBA1C 6.1 10/12/2019    CT CHEST LUNG CA SCREEN LOW DOSE W/O CM  Result Date: 05/12/2019 CLINICAL DATA:  64 year old male current smoker, with 69 pack-year history of smoking, for initial lung cancer screening. EXAM: CT CHEST WITHOUT CONTRAST LOW-DOSE FOR LUNG CANCER SCREENING TECHNIQUE: Multidetector CT imaging of the chest was performed following the standard protocol without IV contrast. COMPARISON:  CT chest dated 01/13/2018 FINDINGS: Cardiovascular: The heart is normal in size. No pericardial effusion. Mild ectasia of the ascending thoracic aorta, measuring 3.4 cm. Mild atherosclerotic calcifications of the aortic arch. Mediastinum/Nodes: No suspicious mediastinal lymphadenopathy. Visualized thyroid is unremarkable. Lungs/Pleura: Biapical pleural-parenchymal scarring. Mild paraseptal emphysematous changes, upper lung predominant. Mild bronchiectasis with bronchial wall thickening in the upper and lower lobes. Mild subpleural reticulation/fibrosis in the right lower lobe. No suspicious pulmonary nodules. No pleural effusion or pneumothorax. Upper Abdomen: Visualized upper abdomen is grossly unremarkable, noting vascular calcifications. Musculoskeletal: Visualized osseous structures are within normal limits. IMPRESSION: Lung-RADS 1, negative. Continue annual screening with low-dose chest CT without contrast in 12 months. Aortic Atherosclerosis (ICD10-I70.0) and Emphysema (ICD10-J43.9). Electronically Signed   By: Julian Hy M.D.   On: 05/12/2019 08:18    Assessment & Plan:   Ysabel was seen today for hyperlipidemia and copd.  Diagnoses and all orders for this visit:  Mucopurulent  chronic bronchitis ([place])- Improvement noted with use of the LAMA. I asked him to quit smoking.  Hyperlipidemia with target LDL less than 130- He is doing well on that statin.   I am having Jarome L. Yoho maintain his acetaminophen, pantoprazole, rosuvastatin, Spiriva Respimat, sertraline, and gabapentin.  No orders of the defined types were placed in this encounter.    Follow-up: Return in about 6 months (around 07/12/2020).  Scarlette Calico, MD

## 2020-01-21 MED FILL — GABAPENTIN 300 MG CAPSULE: 300 | 30 days supply | Qty: 90 | Fill #1

## 2020-02-22 MED FILL — SPIRIVA RESPIMAT 1.25 MCG I: 1.25 | 30 days supply | Qty: 4 | Fill #1

## 2020-02-22 MED FILL — GABAPENTIN 300 MG CAPSULE: 300 | 30 days supply | Qty: 90 | Fill #2

## 2020-02-24 ENCOUNTER — Other Ambulatory Visit: Payer: Self-pay

## 2020-02-24 ENCOUNTER — Encounter: Payer: Self-pay | Admitting: Internal Medicine

## 2020-02-24 ENCOUNTER — Ambulatory Visit (INDEPENDENT_AMBULATORY_CARE_PROVIDER_SITE_OTHER): Payer: No Typology Code available for payment source | Admitting: Internal Medicine

## 2020-02-24 VITALS — BP 112/72 | HR 69 | Temp 98.2°F | Resp 16 | Ht 68.0 in | Wt 158.0 lb

## 2020-02-24 DIAGNOSIS — R270 Ataxia, unspecified: Secondary | ICD-10-CM | POA: Insufficient documentation

## 2020-02-24 DIAGNOSIS — G3281 Cerebellar ataxia in diseases classified elsewhere: Secondary | ICD-10-CM | POA: Diagnosis not present

## 2020-02-24 NOTE — Progress Notes (Signed)
Subjective:  Patient ID: Taylor Ochoa, male    DOB: Dec 01, 1955  Age: 64 y.o. MRN: [id number]  CC: Gait Problem  This visit occurred during the SARS-CoV-2 public health emergency.  Safety protocols were in place, including screening questions prior to the visit, additional usage of staff PPE, and extensive cleaning of exam room while observing appropriate contact time as indicated for disinfecting solutions.    HPI Taylor Ochoa presents for concerns about a 2-week history of feeling off balance.  He says he feels like he is going to veer to either 1 side or the other.  His wife has become concerned about it.  He denies dizziness, vertigo, lightheadedness, headache, changes in vision or hearing, paresthesias, neck pain, or back pain.  Outpatient Medications Prior to Visit  Medication Sig Dispense Refill   acetaminophen (TYLENOL) 500 MG tablet Take 500 mg by mouth every 6 (six) hours as needed.     gabapentin (NEURONTIN) 300 MG capsule TAKE 1 CAPSULE (300 MG TOTAL) BY MOUTH 3 TIMES A DAY 90 capsule 5   pantoprazole (PROTONIX) 40 MG tablet Take 1 tablet (40 mg total) by mouth daily. 90 tablet 1   rosuvastatin (CRESTOR) 20 MG tablet Take 1 tablet (20 mg total) by mouth daily. 90 tablet 1   sertraline (ZOLOFT) 100 MG tablet Take 1 tablet (100 mg total) by mouth daily. 90 tablet 1   Tiotropium Bromide Monohydrate (SPIRIVA RESPIMAT) 1.25 MCG/ACT AERS Inhale 2 puffs into the lungs daily. 12 g 1   No facility-administered medications prior to visit.    ROS Review of Systems  Constitutional: Negative for diaphoresis and fatigue.  HENT: Negative.  Negative for trouble swallowing.   Eyes: Negative for pain and visual disturbance.  Respiratory: Negative for cough, chest tightness, shortness of breath and wheezing.   Cardiovascular: Negative for chest pain, palpitations and leg swelling.  Gastrointestinal: Negative for abdominal pain, diarrhea and nausea.  Endocrine: Negative.     Genitourinary: Negative.  Negative for difficulty urinating.  Musculoskeletal: Positive for gait problem. Negative for arthralgias, back pain, joint swelling, myalgias and neck pain.  Skin: Negative.  Negative for color change.  Neurological: Positive for weakness (diffuse). Negative for dizziness, tremors, syncope, speech difficulty, light-headedness, numbness and headaches.  Hematological: Negative for adenopathy. Does not bruise/bleed easily.  Psychiatric/Behavioral: Negative.     Objective:  BP 112/72 (BP Location: Left Arm, Patient Position: Sitting, Cuff Size: Large)    Pulse 69    Temp 98.2 F (36.8 C) (Oral)    Resp 16    Ht 5\' 8"  (1.727 m)    Wt 158 lb (71.7 kg)    SpO2 97%    BMI 24.02 kg/m   BP Readings from Last 3 Encounters:  02/24/20 112/72  01/11/20 130/74  10/12/19 120/78    Wt Readings from Last 3 Encounters:  02/24/20 158 lb (71.7 kg)  01/11/20 163 lb 6.4 oz (74.1 kg)  10/12/19 162 lb 6.4 oz (73.7 kg)    Physical Exam Vitals reviewed.  Constitutional:      Appearance: Normal appearance. He is not ill-appearing.  HENT:     Nose: Nose normal.     Mouth/Throat:     Mouth: Mucous membranes are moist.  Eyes:     General: No visual field deficit or scleral icterus.    Extraocular Movements: Extraocular movements intact.     Right eye: Normal extraocular motion and no nystagmus.     Left eye: Normal extraocular motion and no  nystagmus.     Conjunctiva/sclera:     Right eye: Right conjunctiva is not injected.     Left eye: Left conjunctiva is not injected.     Pupils: Pupils are equal, round, and reactive to light.  Cardiovascular:     Rate and Rhythm: Normal rate and regular rhythm.     Heart sounds: No murmur.  Pulmonary:     Effort: Pulmonary effort is normal.     Breath sounds: No stridor. No wheezing, rhonchi or rales.  Abdominal:     General: Abdomen is flat. Bowel sounds are normal. There is no distension.     Palpations: Abdomen is soft. There is  no hepatomegaly, splenomegaly or mass.     Tenderness: There is no abdominal tenderness.  Musculoskeletal:     Cervical back: Neck supple.  Lymphadenopathy:     Cervical: No cervical adenopathy.  Skin:    General: Skin is warm and dry.     Coloration: Skin is not jaundiced or pale.  Neurological:     General: No focal deficit present.     Mental Status: He is alert and oriented to person, place, and time. Mental status is at baseline.     Cranial Nerves: Cranial nerves are intact. No cranial nerve deficit, dysarthria or facial asymmetry.     Sensory: Sensation is intact.     Motor: Weakness present. No tremor, atrophy or abnormal muscle tone.     Coordination: Romberg sign positive. Coordination normal. Finger-Nose-Finger Test and Heel to [facility] Test normal. Rapid alternating movements normal.     Gait: Gait is intact. Gait normal.     Deep Tendon Reflexes: Reflexes normal. Babinski sign absent on the right side.     Reflex Scores:      Tricep reflexes are 1+ on the right side and 1+ on the left side.      Bicep reflexes are 1+ on the right side and 1+ on the left side.      Brachioradialis reflexes are 1+ on the right side and 1+ on the left side.      Patellar reflexes are 2+ on the right side and 2+ on the left side.      Achilles reflexes are 1+ on the right side and 1+ on the left side.    Comments: There is mild, symmetrical weakness and clonus in both lower extremities.  Psychiatric:        Mood and Affect: Mood normal.        Behavior: Behavior normal.     Lab Results  Component Value Date   WBC 7.1 10/12/2019   HGB 13.7 10/12/2019   HCT 41.4 10/12/2019   PLT 206.0 10/12/2019   GLUCOSE 93 04/08/2019   CHOL 108 10/12/2019   TRIG 40.0 10/12/2019   HDL 43.60 10/12/2019   LDLCALC 56 10/12/2019   ALT 18 10/12/2019   AST 21 10/12/2019   NA 138 04/08/2019   K 4.1 04/08/2019   CL 104 04/08/2019   CREATININE 0.98 04/08/2019   BUN 20 04/08/2019   CO2 27 04/08/2019   TSH  1.55 04/08/2019   PSA 2.66 04/08/2019   INR 1.13 01/13/2018   HGBA1C 6.1 10/12/2019    CT CHEST LUNG CA SCREEN LOW DOSE W/O CM  Result Date: 05/12/2019 CLINICAL DATA:  64 year old male current smoker, with 69 pack-year history of smoking, for initial lung cancer screening. EXAM: CT CHEST WITHOUT CONTRAST LOW-DOSE FOR LUNG CANCER SCREENING TECHNIQUE: Multidetector CT imaging of the chest  was performed following the standard protocol without IV contrast. COMPARISON:  CT chest dated 01/13/2018 FINDINGS: Cardiovascular: The heart is normal in size. No pericardial effusion. Mild ectasia of the ascending thoracic aorta, measuring 3.4 cm. Mild atherosclerotic calcifications of the aortic arch. Mediastinum/Nodes: No suspicious mediastinal lymphadenopathy. Visualized thyroid is unremarkable. Lungs/Pleura: Biapical pleural-parenchymal scarring. Mild paraseptal emphysematous changes, upper lung predominant. Mild bronchiectasis with bronchial wall thickening in the upper and lower lobes. Mild subpleural reticulation/fibrosis in the right lower lobe. No suspicious pulmonary nodules. No pleural effusion or pneumothorax. Upper Abdomen: Visualized upper abdomen is grossly unremarkable, noting vascular calcifications. Musculoskeletal: Visualized osseous structures are within normal limits. IMPRESSION: Lung-RADS 1, negative. Continue annual screening with low-dose chest CT without contrast in 12 months. Aortic Atherosclerosis (ICD10-I70.0) and Emphysema (ICD10-J43.9). Electronically Signed   By: Julian Hy M.D.   On: 05/12/2019 08:18    Assessment & Plan:   [place] was seen today for gait problem.  Diagnoses and all orders for this visit:  Ataxia- He has a vague complaint of ataxia with no localizing symptoms and the only abnormal finding on his neurologic exam is weakness and clonus in the lower extremities.  I have asked him to undergo an MRI - brain and cervical spine to screen for CVA, atrophy,  demyelination, NPH, spinal stenosis, mass. -     MR Brain Wo Contrast; Future -     MR Cervical Spine Wo Contrast; Future  Cerebellar ataxia in diseases classified elsewhere ([place]) -     MR Brain Wo Contrast; Future -     MR Cervical Spine Wo Contrast; Future   I am having Taylor Ochoa maintain his acetaminophen, pantoprazole, rosuvastatin, Spiriva Respimat, sertraline, and gabapentin.  No orders of the defined types were placed in this encounter.    Follow-up: No follow-ups on file.  Scarlette Calico, MD

## 2020-02-25 ENCOUNTER — Encounter: Payer: Self-pay | Admitting: Internal Medicine

## 2020-02-29 ENCOUNTER — Other Ambulatory Visit: Payer: Self-pay

## 2020-02-29 ENCOUNTER — Ambulatory Visit
Admission: RE | Admit: 2020-02-29 | Discharge: 2020-02-29 | Disposition: A | Discharge status: Home / Self Care | Payer: No Typology Code available for payment source | Source: Ambulatory Visit | Attending: Internal Medicine | Admitting: Internal Medicine

## 2020-02-29 ENCOUNTER — Other Ambulatory Visit: Payer: Self-pay | Admitting: Internal Medicine

## 2020-02-29 ENCOUNTER — Encounter: Payer: Self-pay | Admitting: Internal Medicine

## 2020-02-29 DIAGNOSIS — G3281 Cerebellar ataxia in diseases classified elsewhere: Secondary | ICD-10-CM

## 2020-02-29 DIAGNOSIS — R270 Ataxia, unspecified: Secondary | ICD-10-CM

## 2020-02-29 DIAGNOSIS — G9389 Other specified disorders of brain: Secondary | ICD-10-CM

## 2020-02-29 IMAGING — MR MR HEAD W/O CM
11 series · 45 of 48 positions shown · non-contrast
Comparison: Head CT [DATE]

CLINICAL DATA: Ataxia, stroke suspected. Additional provided:
Patient feels off balance.

EXAM:
MRI HEAD WITHOUT CONTRAST
TECHNIQUE: Multiplanar, multiecho pulse sequences of the brain and surrounding
structures were obtained without intravenous contrast.

[Series 5: T1 · sagittal · 5.0mm · 0.45mm/px · 2 of 24 slices shown]
[im 1/24]
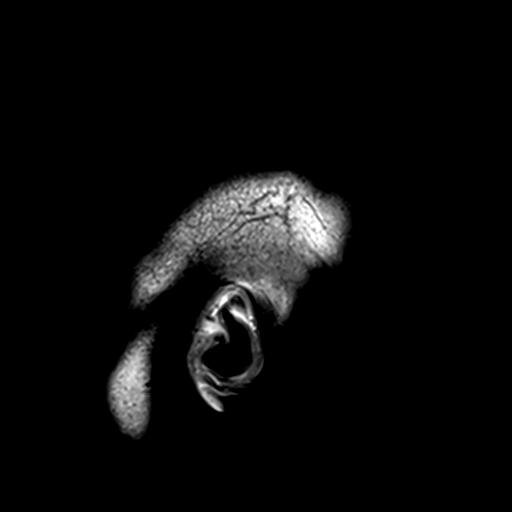
[im 24/24]
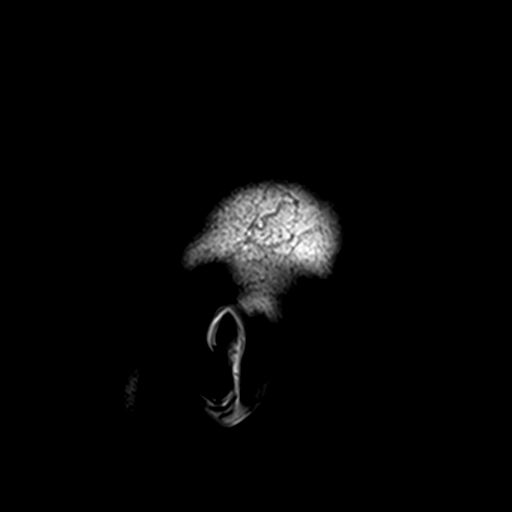

[Series 6: DWI · axial · 3.0mm · 1.80mm/px · z∈[-50,+103]mm · 8 of 106 slices shown (1 of 4)]
[im 1/106]
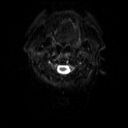
[im 16/106]
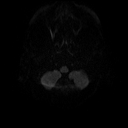
[im 31/106]
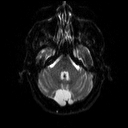
[im 46/106]
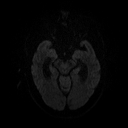
[im 61/106]
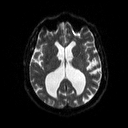
[im 76/106]
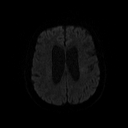
[im 91/106]
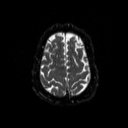
[im 106/106]
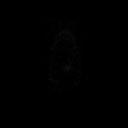

[Series 7: DWI · axial · 3.0mm · 1.80mm/px · z∈[-50,+103]mm · 4 of 52 slices shown (2 of 4)]
[im 1/52]
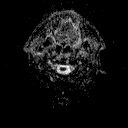
[im 18/52]
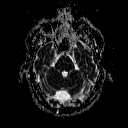
[im 35/52]
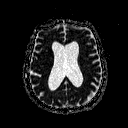
[im 52/52]
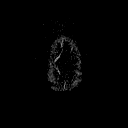

[Series 9: swi_images · axial · 2.0mm · 0.90mm/px · z∈[-50,+105]mm · 6 of 80 slices shown]
[im 1/80]
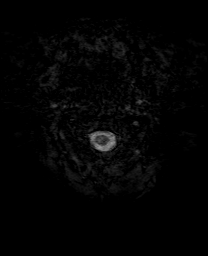
[im 16/80]
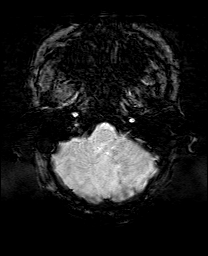
[im 32/80]
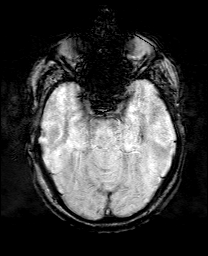
[im 48/80]
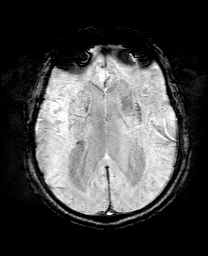
[im 64/80]
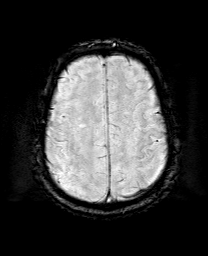
[im 80/80]
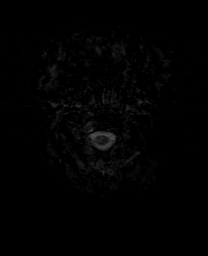

[Series 10: DWI · coronal · 5.0mm · 1.80mm/px · 5 of 70 slices shown (3 of 4)]
[im 1/70]
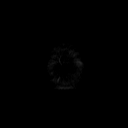
[im 18/70]
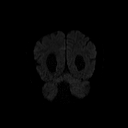
[im 35/70]
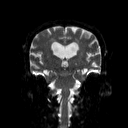
[im 52/70]
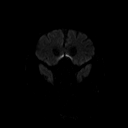
[im 70/70]
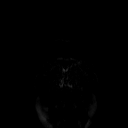

[Series 11: DWI · coronal · 5.0mm · 1.80mm/px · 3 of 35 slices shown (4 of 4)]
[im 1/35]
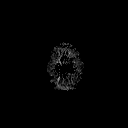
[im 18/35]
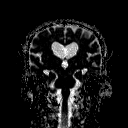
[im 35/35]
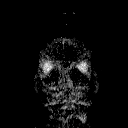

[Series 12: T2 · axial · 5.0mm · 0.51mm/px · z∈[-45,+101]mm · 2 of 23 slices shown (1 of 3)]
[im 1/23]
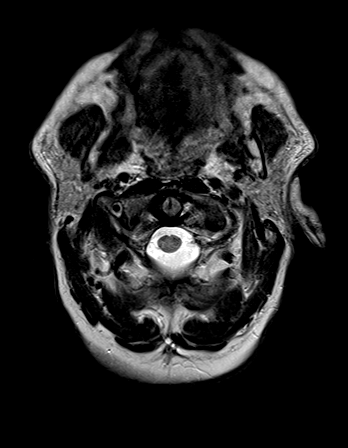
[im 23/23]
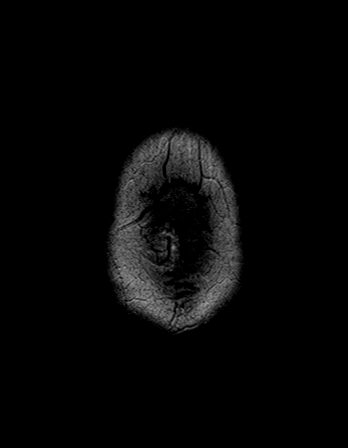

[Series 13: FLAIR · axial · 3.0mm · 0.45mm/px · z∈[-50,+103]mm · 2 of 27 slices shown]
[im 1/27]
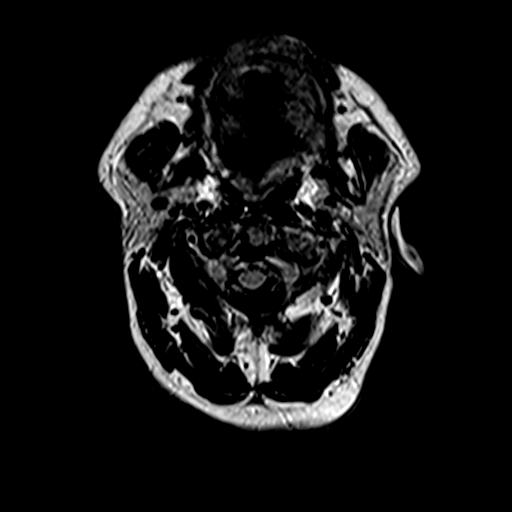
[im 27/27]
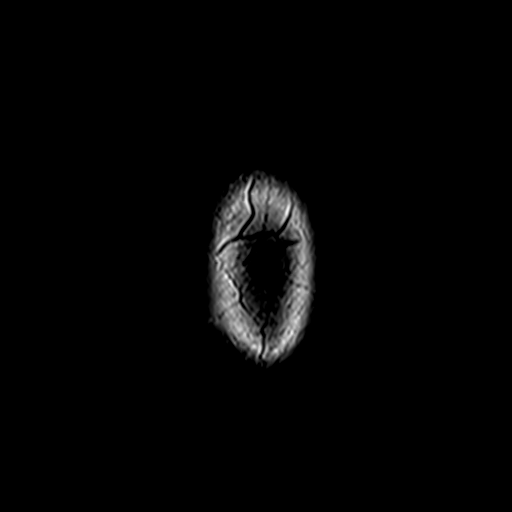

[Series 14: T2 · sagittal · 5.0mm · 0.45mm/px · 2 of 26 slices shown (2 of 3)]
[im 1/26]
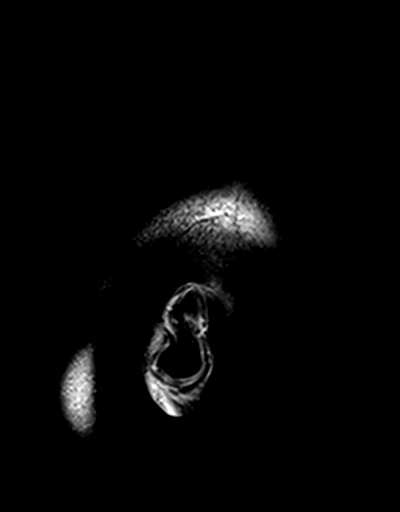
[im 26/26]
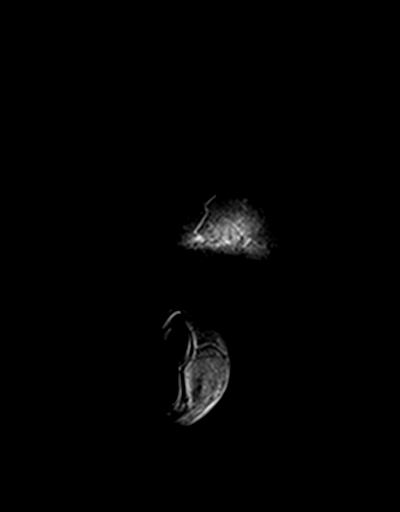

[Series 15: t1_mpr_tra copy center · axial · 1.0mm · 0.45mm/px · z∈[-50,+106]mm · 9 of 160 slices shown]
[im 1/160]
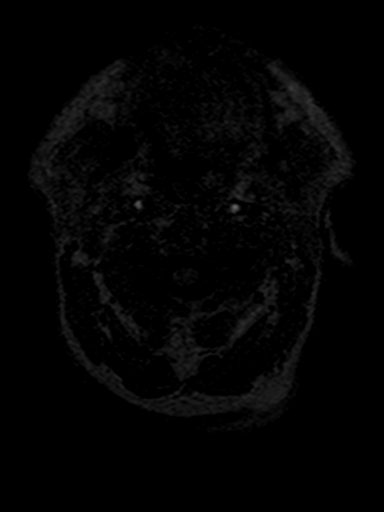
[im 15/160]
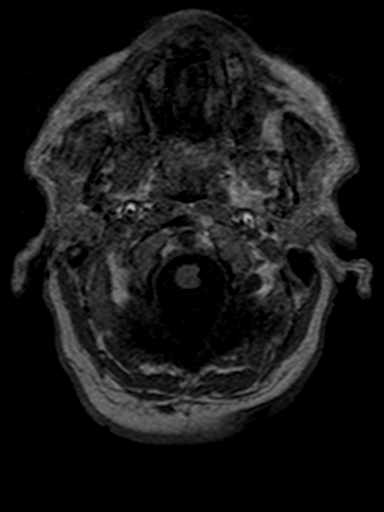
[im 29/160]
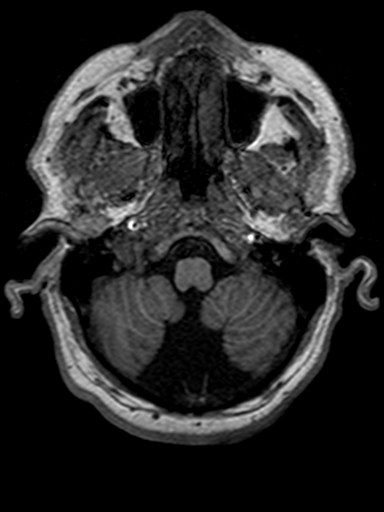
[im 44/160]
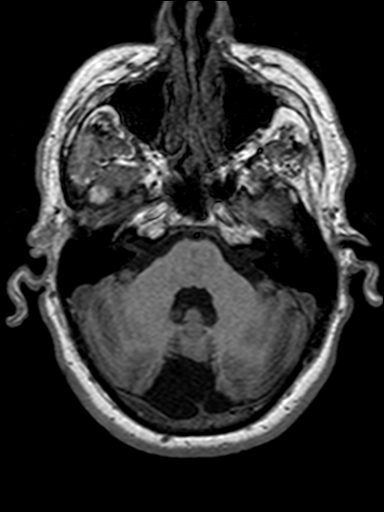
[im 73/160]
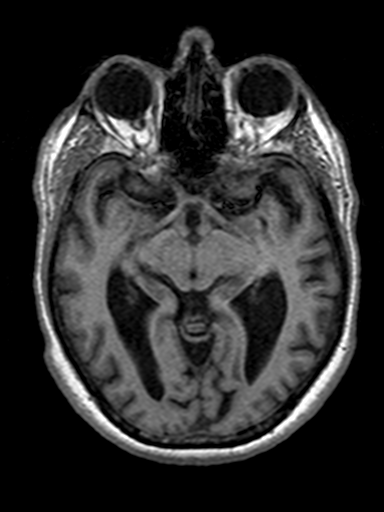
[im 87/160]
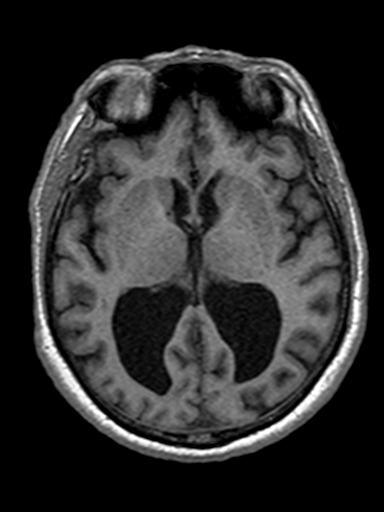
[im 116/160]
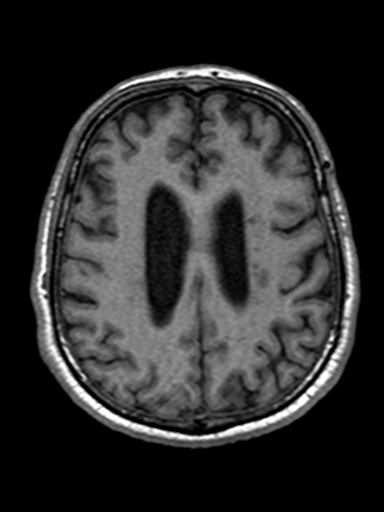
[im 131/160]
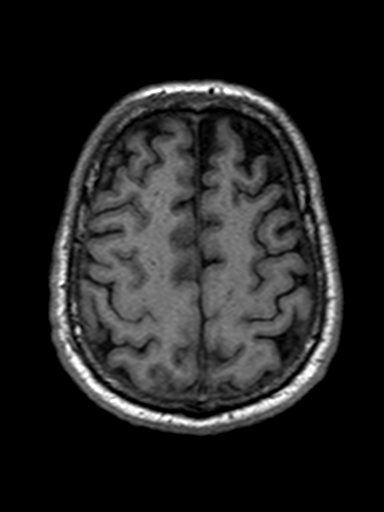
[im 160/160]
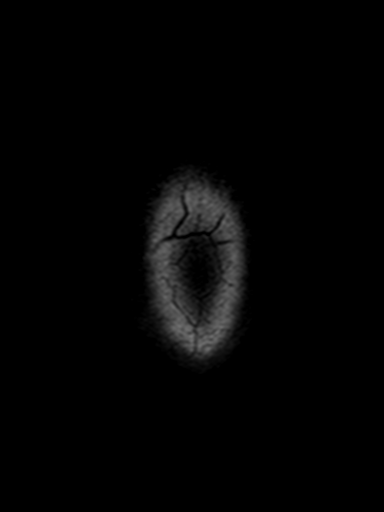

[Series 16: T2 · coronal · 5.0mm · 0.45mm/px · 2 of 27 slices shown (3 of 3)]
[im 1/27]
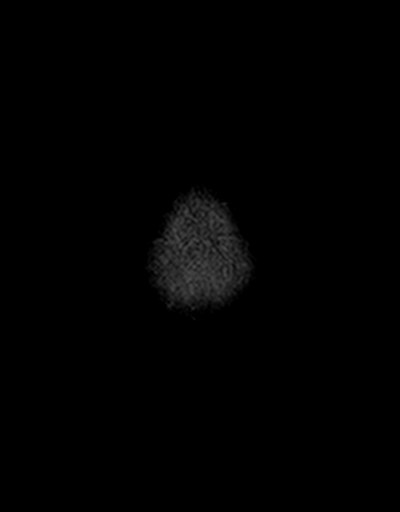
[im 27/27]
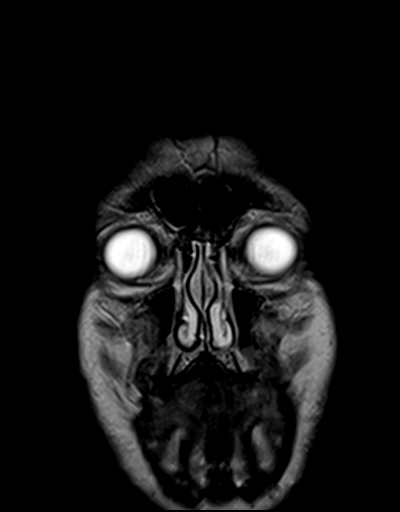

[45 of 48 positions shown; findings below may reference images not displayed]

FINDINGS: Brain:

Stable, mild generalized parenchymal atrophy.

Unchanged prominence of the lateral and third ventricles.

Unchanged prominent retro cerebellar CSF space which may reflect a
mega cisterna magna or less likely arachnoid cyst.

Mild patchy T2/FLAIR hyperintensity within the cerebral white matter
is nonspecific, but consistent with chronic small vessel ischemic
disease. Small chronic lacunar infarct within the left thalamus.

There is no acute infarct.

No evidence of intracranial mass.

No chronic intracranial blood products.

No extra-axial fluid collection.

No midline shift.

Vascular: Expected proximal arterial flow voids.

Skull and upper cervical spine: No focal marrow lesion.

Sinuses/Orbits: Visualized orbits show no acute finding. Mild
ethmoid sinus mucosal thickening. Trace fluid within right mastoid
air cells.
IMPRESSION: No evidence of acute intracranial abnormality, including acute
infarction.

Stable mild generalized parenchymal atrophy and chronic small vessel
ischemic disease.

Unchanged lateral and third ventriculomegaly.

Redemonstrated prominent retrocerebellar CSF space consistent with
mega cisterna magna versus posterior fossa arachnoid cyst.

Mild ethmoid sinus mucosal thickening.

Trace right mastoid effusion.

## 2020-03-01 ENCOUNTER — Ambulatory Visit
Admission: RE | Admit: 2020-03-01 | Discharge: 2020-03-01 | Disposition: A | Discharge status: Home / Self Care | Payer: No Typology Code available for payment source | Source: Ambulatory Visit | Attending: Internal Medicine | Admitting: Internal Medicine

## 2020-03-01 DIAGNOSIS — G3281 Cerebellar ataxia in diseases classified elsewhere: Secondary | ICD-10-CM

## 2020-03-01 DIAGNOSIS — R270 Ataxia, unspecified: Secondary | ICD-10-CM

## 2020-03-01 IMAGING — MR MR CERVICAL SPINE W/O CM
4 of 5 series · 28 of 48 positions shown · non-contrast
Comparison: Brain MRI yesterday.  CT cervical spine [DATE].

CLINICAL DATA: 63-year-old male with ataxia, balance issues for
several weeks.

EXAM:
MRI CERVICAL SPINE WITHOUT CONTRAST
TECHNIQUE: Multiplanar, multisequence MR imaging of the cervical spine was
performed. No intravenous contrast was administered.

[Series 2: T2 · sagittal · 3.0mm · 0.41mm/px · 6 of 13 slices shown (1 of 2)]
[im 1/13]
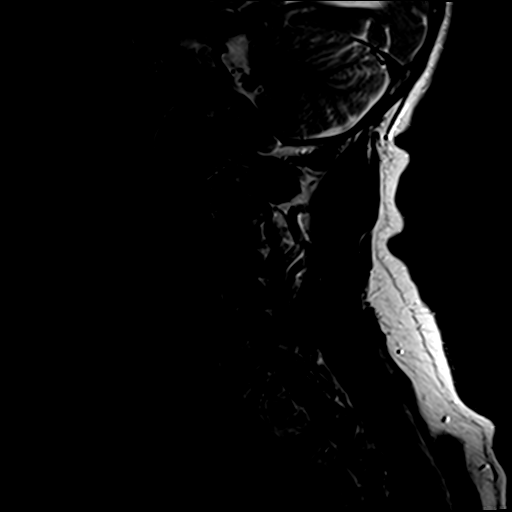
[im 3/13]
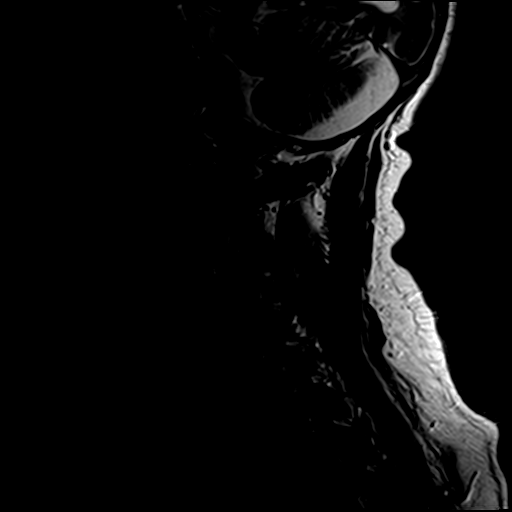
[im 5/13]
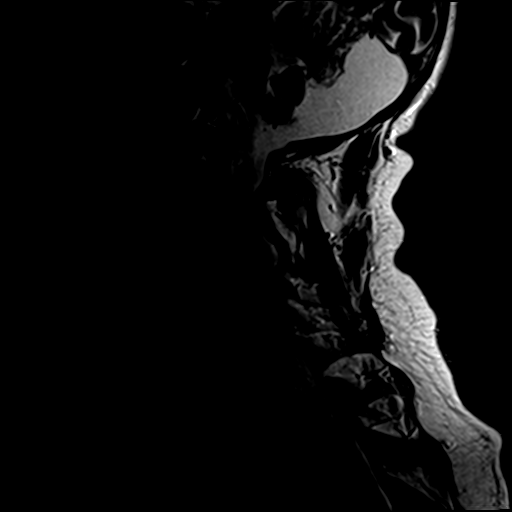
[im 8/13]
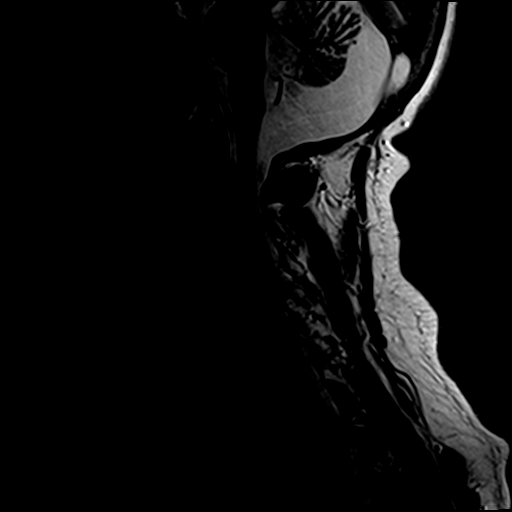
[im 10/13]
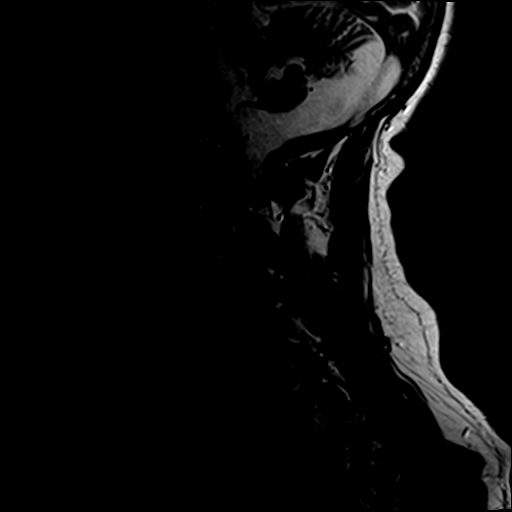
[im 13/13]
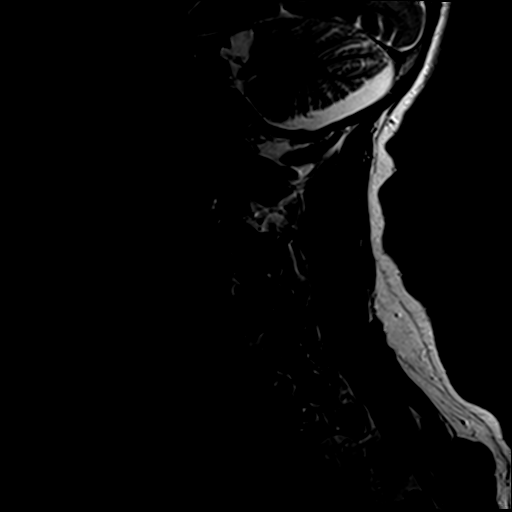

[Series 3: T1 · sagittal · 3.0mm · 0.41mm/px · 7 of 13 slices shown]
[im 1/13]
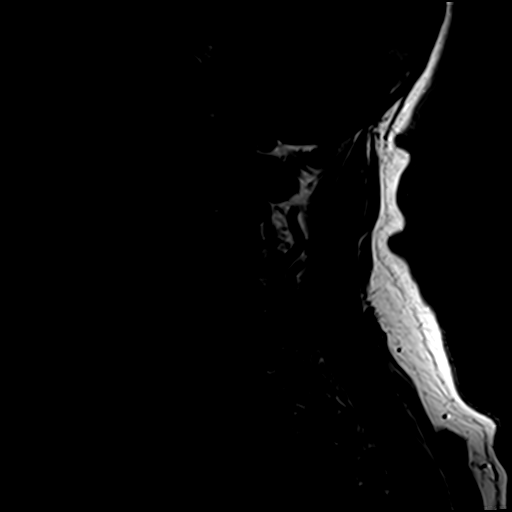
[im 3/13]
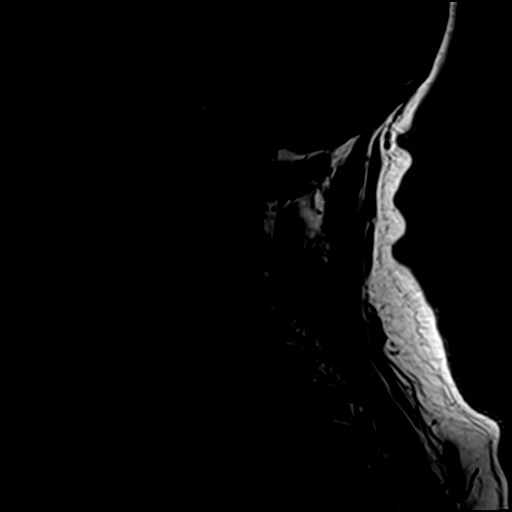
[im 5/13]
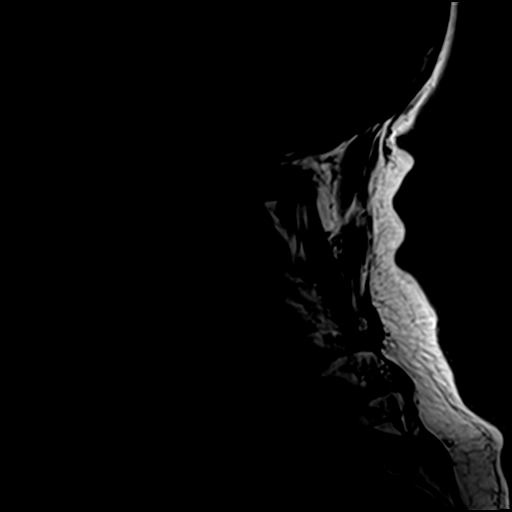
[im 7/13]
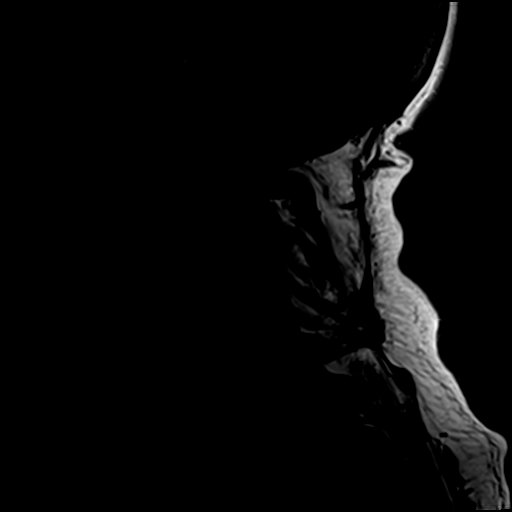
[im 9/13]
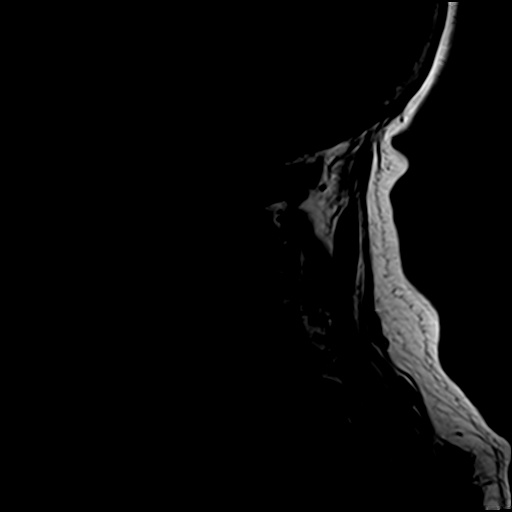
[im 11/13]
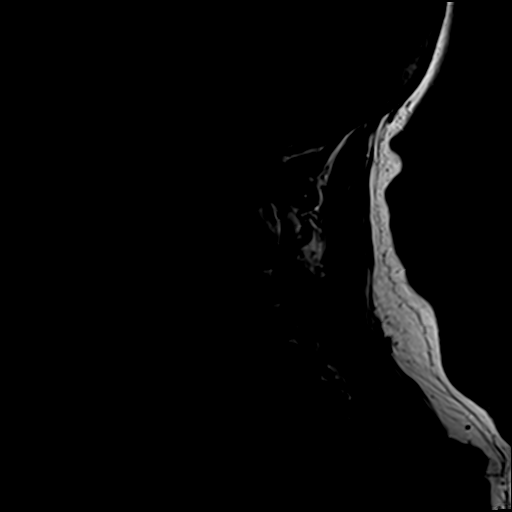
[im 13/13]
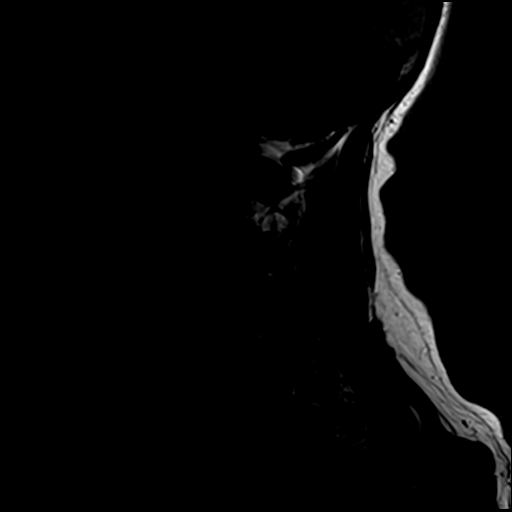

[Series 4: STIR · sagittal · 3.0mm · 0.82mm/px · 7 of 13 slices shown]
[im 1/13]
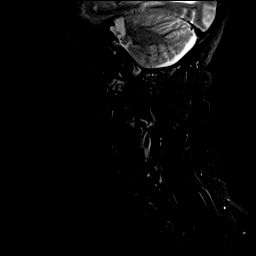
[im 3/13]
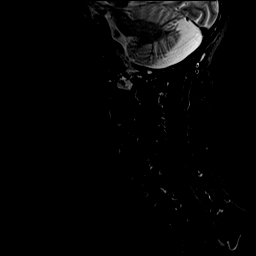
[im 5/13]
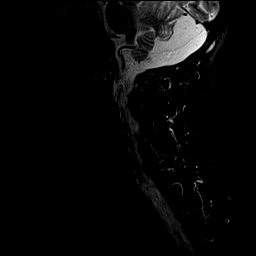
[im 7/13]
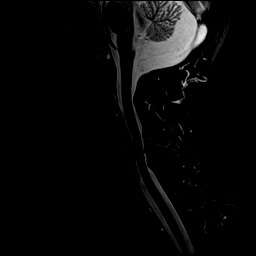
[im 9/13]
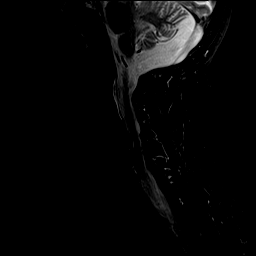
[im 11/13]
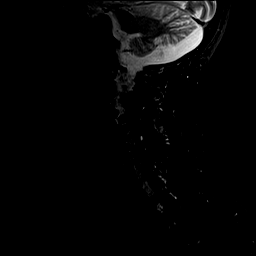
[im 13/13]
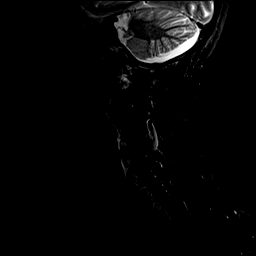

[Series 7: T2 · axial · 3.0mm · 0.70mm/px · z∈[-119,-28]mm · 8 of 26 slices shown (2 of 2)]
[im 1/26]
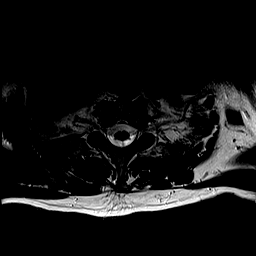
[im 4/26]
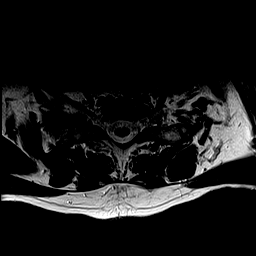
[im 8/26]
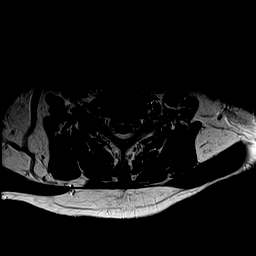
[im 12/26]
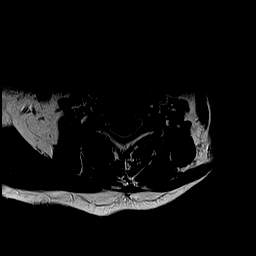
[im 14/26]
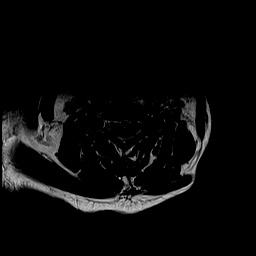
[im 18/26]
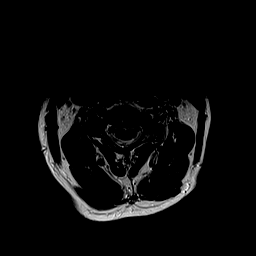
[im 22/26]
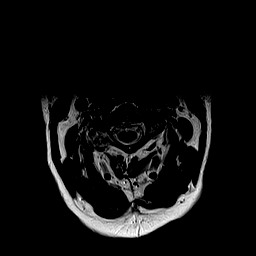
[im 26/26]
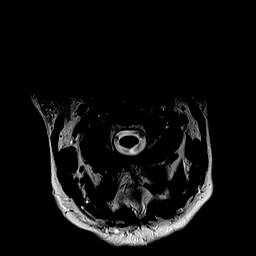

[28 of 48 positions shown; findings below may reference images not displayed]

FINDINGS: Alignment: Stable straightening and mild reversal of lordosis since
[LN]. No significant spondylolisthesis.

Vertebrae: Congenital incomplete segmentation of the C7-T1 vertebral
levels. Faint degenerative endplate marrow edema at C5-C6.
Background bone marrow signal within normal limits.

Cord: No spinal cord signal abnormality despite degenerative spinal
cord mass effect detailed below. Fairly capacious upper thoracic
spinal canal.

Posterior Fossa, vertebral arteries, paraspinal tissues:
Cervicomedullary junction is within normal limits. Stable visible
posterior fossa to that described yesterday. Preserved major
vascular flow voids in the neck. Negative visible neck soft tissues,
lung apices.

Disc levels:

C2-C3: Subtle anterolisthesis with severe facet hypertrophy on the
right. Moderate right C3 foraminal stenosis.

C3-C4: Moderate to severe facet hypertrophy on the left. Mild disc
bulge. Severe left C4 foraminal stenosis.

C4-C5: Severe disc space loss with circumferential disc osteophyte
complex. Broad-based posterior component with mild spinal stenosis
and spinal cord mass effect (series 5, image 11). Moderate bilateral
C5 foraminal stenosis.

C5-C6: Disc space loss with circumferential disc osteophyte complex
eccentric to the right. Mild spinal stenosis. Mild if any cord mass
effect. Moderate left and moderate to severe right C6 foraminal
stenosis.

C6-C7: Mild disc bulging with small left paracentral disc protrusion
(series 5, image 19). Effaced ventral CSF space without significant
spinal stenosis. Borderline to mild C7 foraminal stenosis.

C7-T1:  Congenital incomplete segmentation.  Otherwise negative.

Negative visible upper thoracic spine.
IMPRESSION: 1. Congenital incomplete segmentation of the C7-T1 vertebral levels.
Multilevel advanced cervical spine degeneration elsewhere: Upper
cervical facet arthropathy and lower cervical disc and endplate
degeneration.

2. C4-C5 and C5-C6 mild spinal stenosis and spinal cord mass effect.
No cord signal abnormality.

3. Moderate or severe degenerative neural foraminal stenosis at the
right C3, left C4, bilateral C5 and C6 nerve levels.

## 2020-03-02 ENCOUNTER — Encounter: Payer: Self-pay | Admitting: Internal Medicine

## 2020-03-02 ENCOUNTER — Other Ambulatory Visit: Payer: Self-pay | Admitting: Internal Medicine

## 2020-03-02 DIAGNOSIS — R270 Ataxia, unspecified: Secondary | ICD-10-CM

## 2020-03-02 DIAGNOSIS — G9389 Other specified disorders of brain: Secondary | ICD-10-CM

## 2020-03-23 DIAGNOSIS — M542 Cervicalgia: Secondary | ICD-10-CM | POA: Insufficient documentation

## 2020-03-23 DIAGNOSIS — R413 Other amnesia: Secondary | ICD-10-CM | POA: Insufficient documentation

## 2020-03-23 DIAGNOSIS — R9389 Abnormal findings on diagnostic imaging of other specified body structures: Secondary | ICD-10-CM | POA: Insufficient documentation

## 2020-03-24 ENCOUNTER — Other Ambulatory Visit: Payer: Self-pay | Admitting: Internal Medicine

## 2020-03-24 DIAGNOSIS — E785 Hyperlipidemia, unspecified: Secondary | ICD-10-CM

## 2020-03-24 DIAGNOSIS — F411 Generalized anxiety disorder: Secondary | ICD-10-CM

## 2020-03-24 MED FILL — SPIRIVA RESPIMAT 1.25 MCG I: 1.25 | 30 days supply | Qty: 4 | Fill #2

## 2020-03-24 MED FILL — GABAPENTIN 300 MG CAPSULE: 300 | 30 days supply | Qty: 90 | Fill #3

## 2020-03-24 MED FILL — SERTRALINE HCL 100 MG TAB: 100 | 90 days supply | Qty: 90 | Fill #0

## 2020-03-24 MED FILL — PANTOPRAZOLE SOD DR 40 MG T: 40 | 90 days supply | Qty: 90 | Fill #0

## 2020-03-24 MED FILL — ROSUVASTATIN CALCIUM 20 MG: 20 | 90 days supply | Qty: 90 | Fill #0

## 2020-03-30 ENCOUNTER — Other Ambulatory Visit: Payer: No Typology Code available for payment source

## 2020-04-03 MED FILL — GABAPENTIN 600 MG TABLET: 600 | 30 days supply | Qty: 90 | Fill #0

## 2020-04-06 DIAGNOSIS — G8929 Other chronic pain: Secondary | ICD-10-CM | POA: Insufficient documentation

## 2020-04-06 DIAGNOSIS — R29898 Other symptoms and signs involving the musculoskeletal system: Secondary | ICD-10-CM | POA: Insufficient documentation

## 2020-04-27 ENCOUNTER — Ambulatory Visit: Payer: No Typology Code available for payment source | Admitting: Neurology

## 2020-05-10 MED FILL — GABAPENTIN 600 MG TABLET: 600 | 30 days supply | Qty: 90 | Fill #1

## 2020-06-05 MED FILL — SPIRIVA RESPIMAT 1.25 MCG I: 1.25 | 30 days supply | Qty: 4 | Fill #3

## 2020-06-08 ENCOUNTER — Other Ambulatory Visit (HOSPITAL_COMMUNITY): Payer: Self-pay | Admitting: Neurology

## 2020-06-08 MED FILL — GABAPENTIN 600 MG TABLET: 600 | 30 days supply | Qty: 90 | Fill #0

## 2020-06-19 MED FILL — PANTOPRAZOLE SOD DR 40 MG T: 40 | 90 days supply | Qty: 90 | Fill #1

## 2020-06-19 MED FILL — ROSUVASTATIN CALCIUM 20 MG: 20 | 90 days supply | Qty: 90 | Fill #1

## 2020-06-19 MED FILL — SERTRALINE HCL 100 MG TAB: 100 | 90 days supply | Qty: 90 | Fill #1

## 2020-07-04 ENCOUNTER — Other Ambulatory Visit: Payer: Self-pay | Admitting: Physical Medicine & Rehabilitation

## 2020-07-04 DIAGNOSIS — M5416 Radiculopathy, lumbar region: Secondary | ICD-10-CM

## 2020-07-07 MED FILL — GABAPENTIN 600 MG TABLET: 600 | 30 days supply | Qty: 90 | Fill #1

## 2020-07-11 ENCOUNTER — Other Ambulatory Visit: Payer: Self-pay

## 2020-07-11 ENCOUNTER — Other Ambulatory Visit: Payer: Self-pay | Admitting: Internal Medicine

## 2020-07-11 ENCOUNTER — Ambulatory Visit (INDEPENDENT_AMBULATORY_CARE_PROVIDER_SITE_OTHER): Payer: No Typology Code available for payment source | Admitting: Internal Medicine

## 2020-07-11 ENCOUNTER — Encounter: Payer: Self-pay | Admitting: Internal Medicine

## 2020-07-11 VITALS — BP 116/70 | HR 50 | Temp 98.1°F | Ht 68.0 in | Wt 148.0 lb

## 2020-07-11 DIAGNOSIS — Z23 Encounter for immunization: Secondary | ICD-10-CM

## 2020-07-11 DIAGNOSIS — Z720 Tobacco use: Secondary | ICD-10-CM

## 2020-07-11 DIAGNOSIS — E785 Hyperlipidemia, unspecified: Secondary | ICD-10-CM

## 2020-07-11 DIAGNOSIS — R7303 Prediabetes: Secondary | ICD-10-CM

## 2020-07-11 DIAGNOSIS — K219 Gastro-esophageal reflux disease without esophagitis: Secondary | ICD-10-CM

## 2020-07-11 DIAGNOSIS — Z0000 Encounter for general adult medical examination without abnormal findings: Secondary | ICD-10-CM | POA: Diagnosis not present

## 2020-07-11 DIAGNOSIS — J411 Mucopurulent chronic bronchitis: Secondary | ICD-10-CM

## 2020-07-11 DIAGNOSIS — M5416 Radiculopathy, lumbar region: Secondary | ICD-10-CM

## 2020-07-11 LAB — BASIC METABOLIC PANEL
BUN: 19 mg/dL (ref 6–23)
CO2: 31 mEq/L (ref 19–32)
Calcium: 8.9 mg/dL (ref 8.4–10.5)
Chloride: 106 mEq/L (ref 96–112)
Creatinine, Ser: 0.92 mg/dL (ref 0.40–1.50)
GFR: 87.49 mL/min (ref >60.00)
Glucose, Bld: 97 mg/dL (ref 70–99)
Potassium: 4.5 mEq/L (ref 3.5–5.1)
Sodium: 142 mEq/L (ref 135–145)

## 2020-07-11 LAB — HEPATIC FUNCTION PANEL
ALT: 19 U/L (ref 0–53)
AST: 18 U/L (ref 0–37)
Albumin: 4.2 g/dL (ref 3.5–5.2)
Alkaline Phosphatase: 61 U/L (ref 39–117)
Bilirubin, Direct: 0.1 mg/dL (ref 0.0–0.3)
Total Bilirubin: 0.4 mg/dL (ref 0.2–1.2)
Total Protein: 6.8 g/dL (ref 6.0–8.3)

## 2020-07-11 LAB — CBC WITH DIFFERENTIAL/PLATELET
Basophils Absolute: 0.1 10*3/uL (ref 0.0–0.1)
Basophils Relative: 0.7 % (ref 0.0–3.0)
Eosinophils Absolute: 0.1 10*3/uL (ref 0.0–0.7)
Eosinophils Relative: 0.7 % (ref 0.0–5.0)
HCT: 40.5 % (ref 39.0–52.0)
Hemoglobin: 13.6 g/dL (ref 13.0–17.0)
Lymphocytes Relative: 23.4 % (ref 12.0–46.0)
Lymphs Abs: 1.9 10*3/uL (ref 0.7–4.0)
MCHC: 33.7 g/dL (ref 30.0–36.0)
MCV: 97.4 fl (ref 78.0–100.0)
Monocytes Absolute: 0.5 10*3/uL (ref 0.1–1.0)
Monocytes Relative: 6.6 % (ref 3.0–12.0)
Neutro Abs: 5.7 10*3/uL (ref 1.4–7.7)
Neutrophils Relative %: 68.6 % (ref 43.0–77.0)
Platelets: 219 10*3/uL (ref 150.0–400.0)
RBC: 4.16 Mil/uL — ABNORMAL LOW (ref 4.22–5.81)
RDW: 13.9 % (ref 11.5–15.5)
WBC: 8.3 10*3/uL (ref 4.0–10.5)

## 2020-07-11 LAB — PSA: PSA: 2.87 ng/mL (ref 0.10–4.00)

## 2020-07-11 LAB — LIPID PANEL
Cholesterol: 165 mg/dL (ref 0–200)
HDL: 37.1 mg/dL — ABNORMAL LOW (ref >39.00)
LDL Cholesterol: 110 mg/dL — ABNORMAL HIGH (ref 0–99)
NonHDL: 127.73
Total CHOL/HDL Ratio: 4
Triglycerides: 90 mg/dL (ref 0.0–149.0)
VLDL: 18 mg/dL (ref 0.0–40.0)

## 2020-07-11 LAB — HEMOGLOBIN A1C: Hgb A1c MFr Bld: 6.2 % (ref 4.6–6.5)

## 2020-07-11 LAB — TSH: TSH: 1.04 u[IU]/mL (ref 0.35–4.50)

## 2020-07-11 MED ORDER — ROSUVASTATIN CALCIUM 20 MG PO TABS: 20.0000 mg | ORAL_TABLET | Freq: Every day | ORAL | 1 refills | Status: DC

## 2020-07-11 MED ORDER — TRAMADOL HCL 50 MG PO TABS: 50.0000 mg | ORAL_TABLET | Freq: Four times a day (QID) | ORAL | 1 refills | Status: DC | PRN

## 2020-07-11 MED ORDER — SPIRIVA RESPIMAT 1.25 MCG/ACT IN AERS: 2.0000 | INHALATION_SPRAY | Freq: Every day | RESPIRATORY_TRACT | 1 refills | Status: DC

## 2020-07-11 MED ORDER — GABAPENTIN 600 MG PO TABS: 600.0000 mg | ORAL_TABLET | Freq: Three times a day (TID) | ORAL | 1 refills | Status: DC

## 2020-07-11 MED FILL — traMADol HCL 50 MG TABS: 50 | 68 days supply | Qty: 270 | Fill #0

## 2020-07-11 MED FILL — SPIRIVA RESPIMAT 1.25 MCG I: 1.25 | 90 days supply | Qty: 12 | Fill #0

## 2020-07-11 NOTE — Progress Notes (Signed)
Subjective:  Patient ID: Taylor Ochoa, male    DOB: [birth year]  Age: 64 y.o. MRN: [id number]  CC: Annual Exam, COPD, and Back Pain  This visit occurred during the SARS-CoV-2 public health emergency.  Safety protocols were in place, including screening questions prior to the visit, additional usage of staff PPE, and extensive cleaning of exam room while observing appropriate contact time as indicated for disinfecting solutions.    HPI Taylor Ochoa presents for a CPX.  Since I last saw him he has seen a neurologist and a physiatrist.  He describes having electrical studies done of his lower extremities.  He tells me his gait is much better.  He continues to complain of right low back pain that radiates into his RLE.  He also complains of paresthesias in his right thigh.  He would like to increase the dose of gabapentin and add something in addition to that to control the back pain.  He continues to smoke cigarettes.  He says he has a rare cough that is productive of thin yellow phlegm.  He uses his LAMA inhaler sporadically.  He denies chest pain, dyspnea on exertion, diaphoresis, fever, chills, hemoptysis, or weight loss.  He tells me his heartburn is well controlled.  He denies odynophagia, dysphagia, loss of appetite, or weight loss.  Outpatient Medications Prior to Visit  Medication Sig Dispense Refill  . acetaminophen (TYLENOL) 500 MG tablet Take 500 mg by mouth every 6 (six) hours as needed.    . pantoprazole (PROTONIX) 40 MG tablet TAKE 1 TABLET (40 MG TOTAL) BY MOUTH DAILY. 90 tablet 1  . sertraline (ZOLOFT) 100 MG tablet TAKE 1 TABLET BY MOUTH ONCE DAILY 90 tablet 1  . rosuvastatin (CRESTOR) 20 MG tablet TAKE 1 TABLET (20 MG TOTAL) BY MOUTH DAILY. 90 tablet 1  . Tiotropium Bromide Monohydrate (SPIRIVA RESPIMAT) 1.25 MCG/ACT AERS Inhale 2 puffs into the lungs daily. 12 g 1  . gabapentin (NEURONTIN) 300 MG capsule TAKE 1 CAPSULE (300 MG TOTAL) BY MOUTH 3 TIMES A DAY (Patient not  taking: Reported on 07/11/2020) 90 capsule 5   No facility-administered medications prior to visit.    ROS Review of Systems  Constitutional: Negative for appetite change, chills, diaphoresis, fatigue, fever and unexpected weight change.  HENT: Negative.  Negative for sinus pressure, sore throat, trouble swallowing and voice change.   Eyes: Negative.   Respiratory: Positive for cough. Negative for chest tightness, shortness of breath and wheezing.   Cardiovascular: Negative for chest pain, palpitations and leg swelling.  Gastrointestinal: Negative for abdominal pain, constipation, diarrhea, nausea and vomiting.  Endocrine: Negative.   Genitourinary: Negative.  Negative for difficulty urinating, dysuria and frequency.  Musculoskeletal: Positive for back pain and gait problem. Negative for arthralgias and myalgias.  Skin: Negative.  Negative for color change and rash.  Neurological: Positive for numbness. Negative for dizziness, weakness and light-headedness.  Hematological: Negative for adenopathy. Does not bruise/bleed easily.  Psychiatric/Behavioral: Negative.     Objective:  BP 116/70   Pulse (!) 50   Temp 98.1 F (36.7 C) (Oral)   Ht 5\' 8"  (1.727 m)   Wt 148 lb (67.1 kg)   SpO2 97%   BMI 22.50 kg/m   BP Readings from Last 3 Encounters:  07/11/20 116/70  02/24/20 112/72  01/11/20 130/74    Wt Readings from Last 3 Encounters:  07/11/20 148 lb (67.1 kg)  02/24/20 158 lb (71.7 kg)  01/11/20 163 lb 6.4 oz (74.1 kg)  Physical Exam Vitals reviewed.  Constitutional:      Appearance: Normal appearance.  HENT:     Nose: Nose normal.     Mouth/Throat:     Mouth: Mucous membranes are moist.  Eyes:     General: No scleral icterus.    Conjunctiva/sclera: Conjunctivae normal.  Cardiovascular:     Rate and Rhythm: Normal rate and regular rhythm.     Heart sounds: No murmur heard.   Pulmonary:     Effort: Pulmonary effort is normal.     Breath sounds: No stridor. No  wheezing, rhonchi or rales.  Abdominal:     General: Abdomen is flat. Bowel sounds are normal. There is no distension.     Palpations: Abdomen is soft. There is no hepatomegaly, splenomegaly or mass.     Tenderness: There is no abdominal tenderness.  Musculoskeletal:        General: Normal range of motion.     Cervical back: Neck supple.     Right lower leg: No edema.     Left lower leg: No edema.  Lymphadenopathy:     Cervical: No cervical adenopathy.  Skin:    General: Skin is warm and dry.     Coloration: Skin is not pale.  Neurological:     General: No focal deficit present.     Mental Status: He is alert. Mental status is at baseline.  Psychiatric:        Mood and Affect: Mood normal.        Behavior: Behavior normal.     Lab Results  Component Value Date   WBC 8.3 07/11/2020   HGB 13.6 07/11/2020   HCT 40.5 07/11/2020   PLT 219.0 07/11/2020   GLUCOSE 97 07/11/2020   CHOL 165 07/11/2020   TRIG 90.0 07/11/2020   HDL 37.10 (L) 07/11/2020   LDLCALC 110 (H) 07/11/2020   ALT 19 07/11/2020   AST 18 07/11/2020   NA 142 07/11/2020   K 4.5 07/11/2020   CL 106 07/11/2020   CREATININE 0.92 07/11/2020   BUN 19 07/11/2020   CO2 31 07/11/2020   TSH 1.04 07/11/2020   PSA 2.87 07/11/2020   INR 1.13 01/13/2018   HGBA1C 6.2 07/11/2020    MR Cervical Spine Wo Contrast  Result Date: 03/01/2020 CLINICAL DATA:  64 year old male with ataxia, balance issues for several weeks. EXAM: MRI CERVICAL SPINE WITHOUT CONTRAST TECHNIQUE: Multiplanar, multisequence MR imaging of the cervical spine was performed. No intravenous contrast was administered. COMPARISON:  Brain MRI yesterday.  CT cervical spine 01/13/2018. FINDINGS: Alignment: Stable straightening and mild reversal of lordosis since 2019. No significant spondylolisthesis. 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 Faint degenerative endplate marrow edema at C5-C6. Background bone marrow signal within  normal limits. Cord: No spinal cord signal abnormality despite degenerative spinal cord mass effect detailed below. Fairly capacious upper thoracic spinal canal. Posterior Fossa, vertebral arteries, paraspinal tissues: Cervicomedullary junction is within normal limits. Stable visible posterior fossa to that described yesterday. Preserved major vascular flow voids in the neck. Negative visible neck soft tissues, lung apices. Disc levels: C2-C3: Subtle anterolisthesis with severe facet hypertrophy on the right. Moderate right C3 foraminal stenosis. C3-C4: Moderate to severe facet hypertrophy on the left. Mild disc bulge. Severe left C4 foraminal stenosis. C4-C5: Severe disc space loss with circumferential disc osteophyte complex. Broad-based posterior component with mild spinal stenosis and spinal cord mass effect (series 5, image 11). Moderate bilateral C5 foraminal stenosis. C5-C6: Disc space loss with  circumferential disc osteophyte complex eccentric to the right. Mild spinal stenosis. Mild if any cord mass effect. Moderate left and moderate to severe right C6 foraminal stenosis. C6-C7: Mild disc bulging with small left paracentral disc protrusion (series 5, image 19). Effaced ventral CSF space without significant spinal stenosis. Borderline to mild C7 foraminal stenosis. C7-T1:  Congenital incomplete segmentation.  Otherwise negative. Negative visible upper thoracic spine. IMPRESSION: 1. 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 Multilevel advanced cervical spine degeneration elsewhere: Upper cervical facet arthropathy and lower cervical disc and endplate degeneration. 2. C4-C5 and C5-C6 mild spinal stenosis and spinal cord mass effect. No cord signal abnormality. 3. Moderate or severe degenerative neural foraminal stenosis at the right C3, left C4, bilateral C5 and C6 nerve levels. Electronically Signed   By: Genevie Ann M.D.   On: 03/01/2020 19:18    Assessment & Plan:   Taylor Ochoa was seen  today for annual exam, copd and back pain.  Diagnoses and all orders for this visit:  Prediabetes- His A1c is at 6.2%.  Medical therapy is not indicated. -     Basic metabolic panel; Future -     Hemoglobin A1c; Future -     Hepatic function panel; Future -     Hepatic function panel -     Hemoglobin A1c -     Basic metabolic panel  Mucopurulent chronic bronchitis (HCC)- I recommended that he quit smoking and use the LAMA inhaler as directed.  I will screen him for lung cancer. -     Tiotropium Bromide Monohydrate (SPIRIVA RESPIMAT) 1.25 MCG/ACT AERS; Inhale 2 puffs into the lungs daily.  Routine general medical examination at a health care facility- Exam completed, labs reviewed, vaccines reviewed and updated, he agrees to do Cologuard to screen for colon cancer/polyps, patient education material was given. -     Lipid panel; Future -     PSA; Future -     PSA -     Lipid panel  Hyperlipidemia with target LDL less than 130- He has achieved his LDL goal and is doing well on the statin. -     Hepatic function panel; Future -     TSH; Future -     TSH -     Hepatic function panel -     rosuvastatin (CRESTOR) 20 MG tablet; Take 1 tablet (20 mg total) by mouth daily.  Tobacco abuse -     Ambulatory Referral for Lung Cancer Scre  Right lumbar radiculitis -     gabapentin (NEURONTIN) 600 MG tablet; Take 1 tablet (600 mg total) by mouth 3 (three) times daily. -     Discontinue: traMADol (ULTRAM) 50 MG tablet; Take 1 tablet (50 mg total) by mouth every 6 (six) hours as needed for up to 5 days. -     traMADol (ULTRAM) 50 MG tablet; Take 1 tablet (50 mg total) by mouth every 6 (six) hours as needed.  Gastroesophageal reflux disease, unspecified whether esophagitis present- His symptoms are well controlled with the PPI.  No complications noted. -     CBC with Differential/Platelet; Future -     CBC with Differential/Platelet  Other orders -     Flu Vaccine QUAD 6+ mos PF IM (Fluarix  Quad PF) -     Pneumococcal conjugate vaccine 13-valent   I have discontinued Taylor Ochoa's gabapentin. I have also changed his traMADol. Additionally, I am having him start on gabapentin. Lastly, I am having him maintain  his acetaminophen, sertraline, pantoprazole, Spiriva Respimat, and rosuvastatin.  Meds ordered this encounter  Medications  . gabapentin (NEURONTIN) 600 MG tablet    Sig: Take 1 tablet (600 mg total) by mouth 3 (three) times daily.    Dispense:  270 tablet    Refill:  1  . DISCONTD: traMADol (ULTRAM) 50 MG tablet    Sig: Take 1 tablet (50 mg total) by mouth every 6 (six) hours as needed for up to 5 days.    Dispense:  270 tablet    Refill:  1  . Tiotropium Bromide Monohydrate (SPIRIVA RESPIMAT) 1.25 MCG/ACT AERS    Sig: Inhale 2 puffs into the lungs daily.    Dispense:  12 g    Refill:  1  . traMADol (ULTRAM) 50 MG tablet    Sig: Take 1 tablet (50 mg total) by mouth every 6 (six) hours as needed.    Dispense:  270 tablet    Refill:  1  . rosuvastatin (CRESTOR) 20 MG tablet    Sig: Take 1 tablet (20 mg total) by mouth daily.    Dispense:  90 tablet    Refill:  1   In addition to time spent on CPE, I spent 50 minutes in preparing to see the patient by review of recent labs, imaging and procedures, obtaining and reviewing separately obtained history, communicating with the patient and family or caregiver, ordering medications, tests or procedures, and documenting clinical information in the EHR including the differential Dx, treatment, and any further evaluation and other management of 1. Prediabetes 2. Mucopurulent chronic bronchitis ([place]) 3. Hyperlipidemia with target LDL less than 130 4. Tobacco abuse 5. Right lumbar radiculitis 6. Gastroesophageal reflux disease, unspecified whether esophagitis present     Follow-up: Return in about 6 months (around 01/09/2021).  Scarlette Calico, MD

## 2020-07-11 NOTE — Patient Instructions (Signed)

## 2020-07-12 ENCOUNTER — Encounter: Payer: Self-pay | Admitting: Internal Medicine

## 2020-07-18 ENCOUNTER — Other Ambulatory Visit: Payer: Self-pay

## 2020-07-18 ENCOUNTER — Ambulatory Visit
Admission: RE | Admit: 2020-07-18 | Discharge: 2020-07-18 | Disposition: A | Discharge status: Home / Self Care | Payer: No Typology Code available for payment source | Source: Ambulatory Visit | Attending: Physical Medicine & Rehabilitation | Admitting: Physical Medicine & Rehabilitation

## 2020-07-18 DIAGNOSIS — M5416 Radiculopathy, lumbar region: Secondary | ICD-10-CM | POA: Diagnosis present

## 2020-07-18 IMAGING — MR MR LUMBAR SPINE W/O CM
5 series · 31 of 48 positions shown · non-contrast
Comparison: None.

CLINICAL DATA: Chronic low back pain

EXAM:
MRI LUMBAR SPINE WITHOUT CONTRAST
TECHNIQUE: Multiplanar, multisequence MR imaging of the lumbar spine was
performed. No intravenous contrast was administered.

[Series 5: T2 · sagittal · 4.0mm · 0.81mm/px · 6 of 17 slices shown (1 of 2)]
[im 1/17]
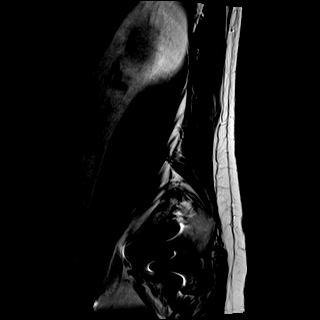
[im 4/17]
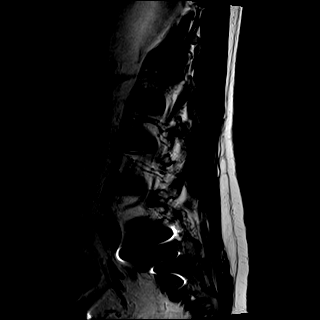
[im 7/17]
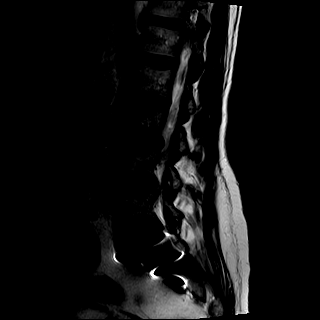
[im 10/17]
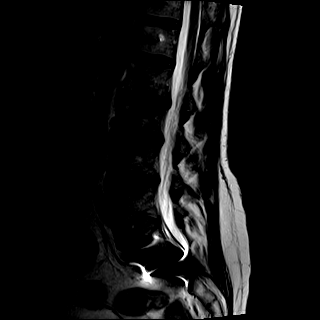
[im 13/17]
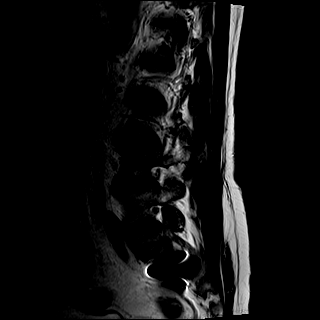
[im 17/17]
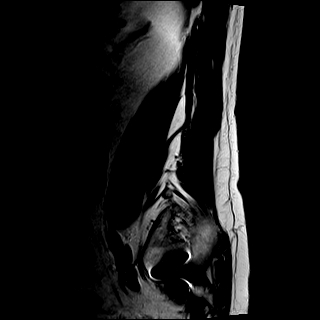

[Series 6: T1 · sagittal · 4.0mm · 0.81mm/px · 6 of 17 slices shown (1 of 2)]
[im 1/17]
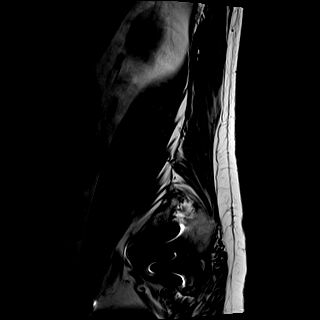
[im 4/17]
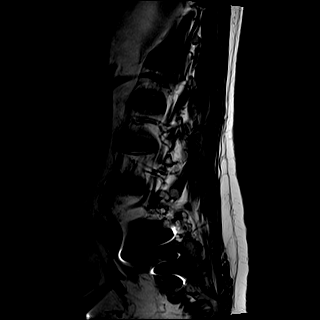
[im 7/17]
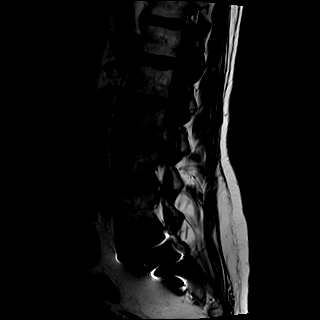
[im 10/17]
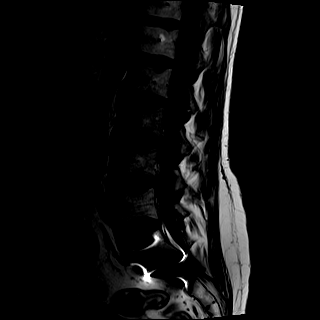
[im 13/17]
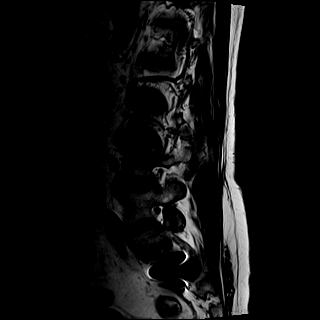
[im 17/17]
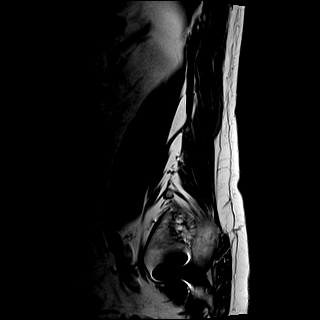

[Series 7: STIR · sagittal · 4.0mm · 0.41mm/px · 1 of 17 slices shown]
[im 1/17]
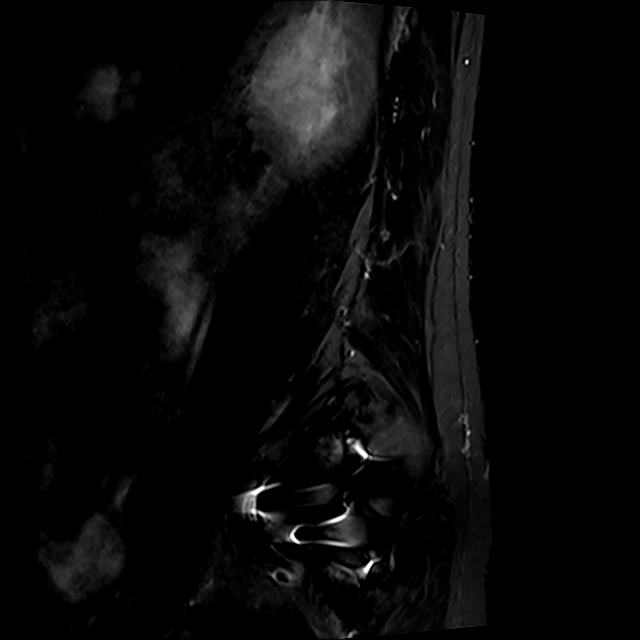

[Series 8: T2 · axial · 4.0mm · 0.78mm/px · z∈[-126,+120]mm · 9 of 44 slices shown (2 of 2)]
[im 1/44]
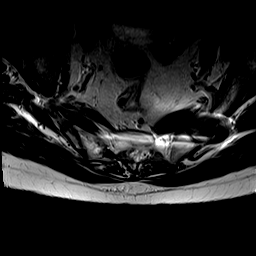
[im 7/44]
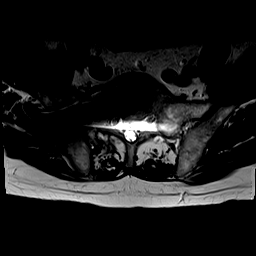
[im 13/44]
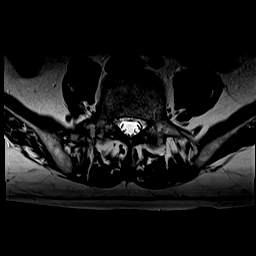
[im 19/44]
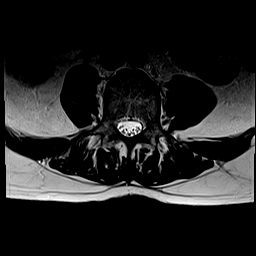
[im 22/44]
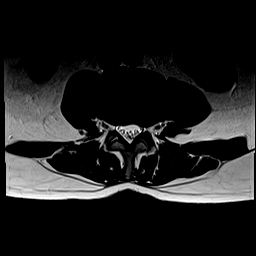
[im 25/44]
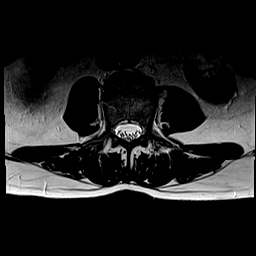
[im 31/44]
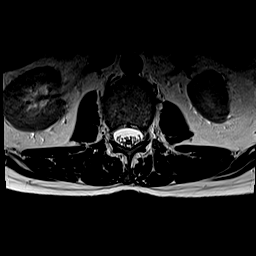
[im 37/44]
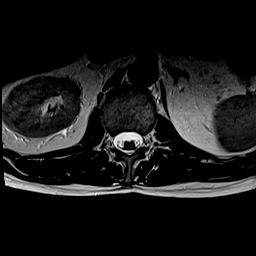
[im 44/44]
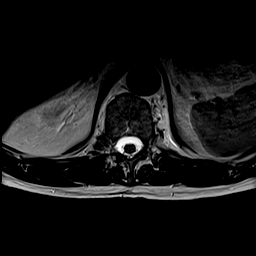

[Series 9: T1 · axial · 4.0mm · 0.39mm/px · z∈[-126,+120]mm · 9 of 44 slices shown (2 of 2)]
[im 1/44]
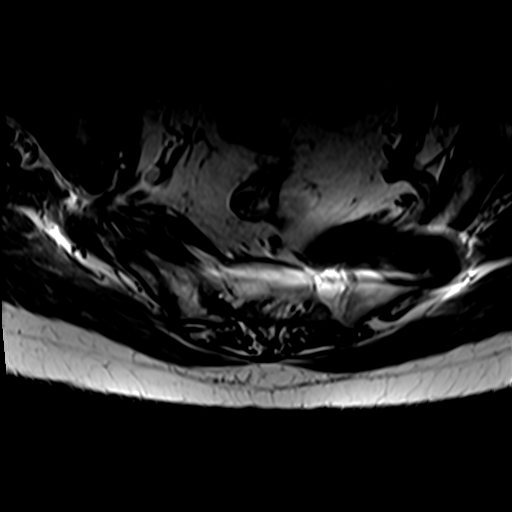
[im 7/44]
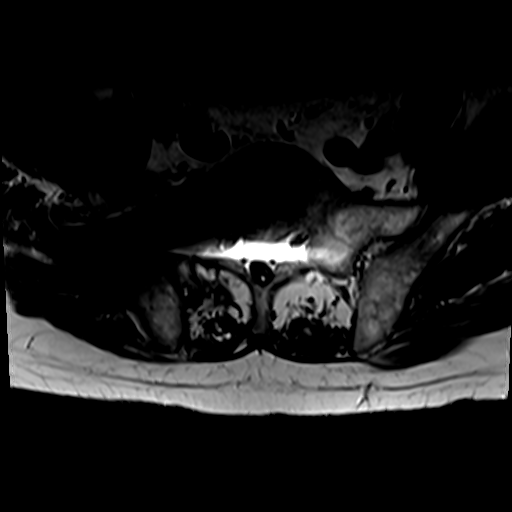
[im 13/44]
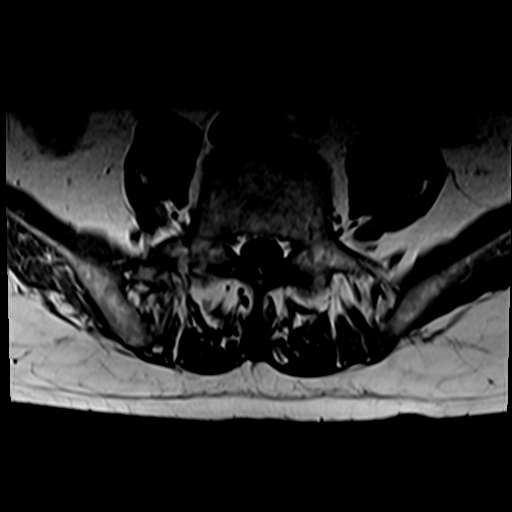
[im 19/44]
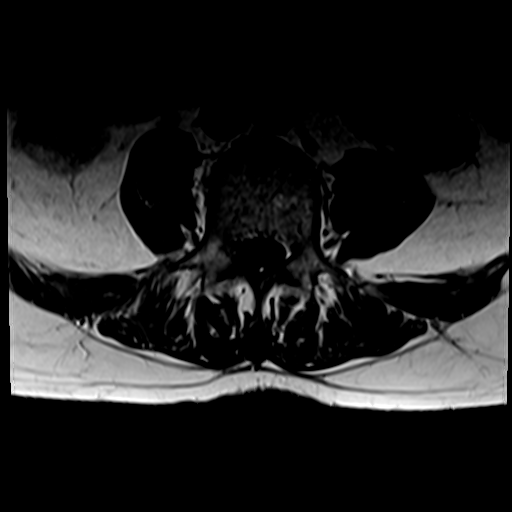
[im 22/44]
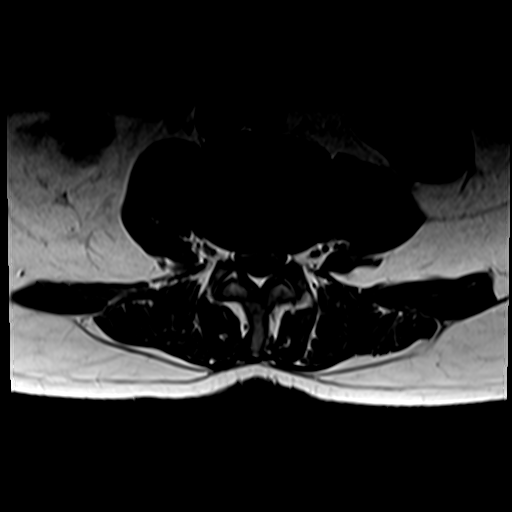
[im 25/44]
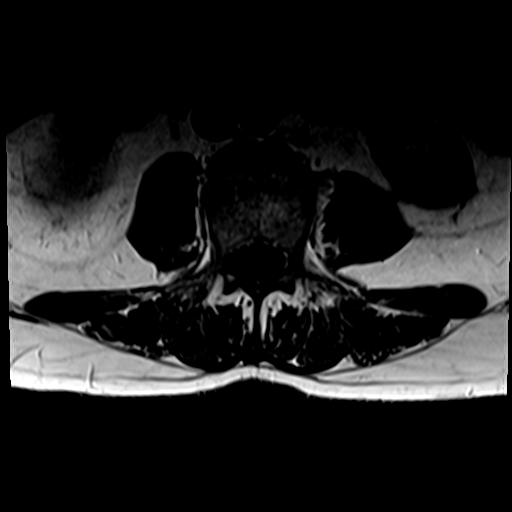
[im 31/44]
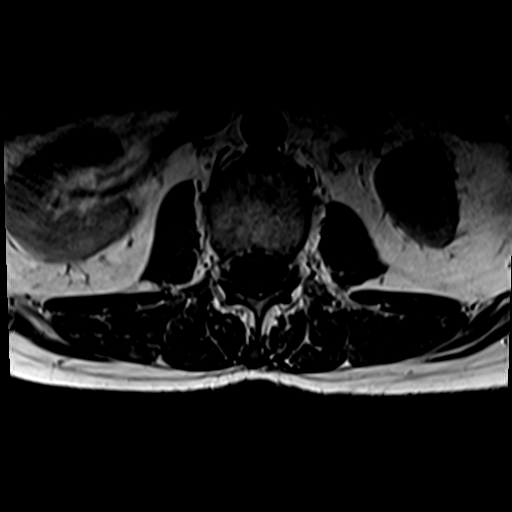
[im 37/44]
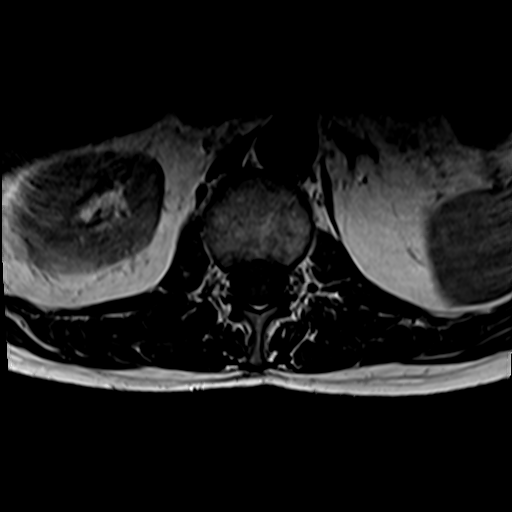
[im 44/44]
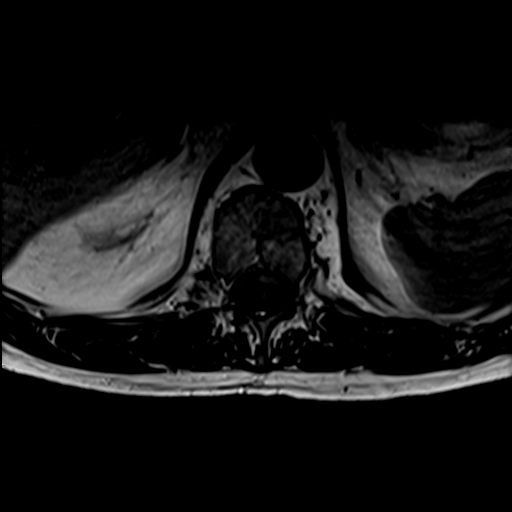

[31 of 48 positions shown; findings below may reference images not displayed]

FINDINGS: Segmentation:  Normal

Alignment:  Mild retrolisthesis L2-3, L3-4, L4-5

Vertebrae: Negative for fracture or mass. Artifact from transverse
screws across the SI joints bilaterally.

Conus medullaris and cauda equina: Conus extends to the L2-3 level.
Conus and cauda equina appear normal.

Paraspinal and other soft tissues: Negative for paraspinous mass or
adenopathy or fluid collection. Abdominal aorta normal in caliber.

Disc levels:

L1-2: Schmorl's node with mild disc degeneration. Negative for
stenosis

L2-3: Disc degeneration with disc bulging. Small left paracentral
disc protrusion. Negative for stenosis.

L3-4: Disc degeneration with diffuse disc bulging and endplate
spurring. Negative for stenosis

L4-5: Disc degeneration with diffuse disc bulging. Mild endplate
spurring on the left with mild subarticular stenosis on the left.
Mild facet degeneration. Spinal canal normal in size

L5-S1: Normal disc space. Mild facet degeneration. Negative for
stenosis.
IMPRESSION: Mild lumbar degenerative change without neural impingement. Mild
subarticular stenosis on the left L4-5

Transverse screws across the SI joints bilaterally causing local
artifact.

## 2020-07-31 ENCOUNTER — Other Ambulatory Visit: Payer: Self-pay | Admitting: *Deleted

## 2020-07-31 DIAGNOSIS — F17210 Nicotine dependence, cigarettes, uncomplicated: Secondary | ICD-10-CM

## 2020-08-07 MED FILL — GABAPENTIN 600 MG TABLET: 600 | 90 days supply | Qty: 270 | Fill #0

## 2020-09-06 ENCOUNTER — Ambulatory Visit (INDEPENDENT_AMBULATORY_CARE_PROVIDER_SITE_OTHER): Payer: No Typology Code available for payment source | Admitting: Family

## 2020-09-06 ENCOUNTER — Other Ambulatory Visit: Payer: Self-pay

## 2020-09-06 ENCOUNTER — Ambulatory Visit (INDEPENDENT_AMBULATORY_CARE_PROVIDER_SITE_OTHER): Payer: No Typology Code available for payment source

## 2020-09-06 ENCOUNTER — Encounter: Payer: Self-pay | Admitting: Family

## 2020-09-06 ENCOUNTER — Other Ambulatory Visit: Payer: Self-pay | Admitting: Family

## 2020-09-06 VITALS — BP 122/78 | HR 70 | Temp 98.5°F | Ht 68.0 in | Wt 162.0 lb

## 2020-09-06 DIAGNOSIS — M25551 Pain in right hip: Secondary | ICD-10-CM

## 2020-09-06 IMAGING — DX DG HIP (WITH OR WITHOUT PELVIS) 2-3V*R*
3 series · 3 of 3 positions shown · non-contrast
Comparison: None.

CLINICAL DATA: Acute right hip pain.

EXAM:
DG HIP (WITH OR WITHOUT PELVIS) 2-3V RIGHT

[pelvis ap]
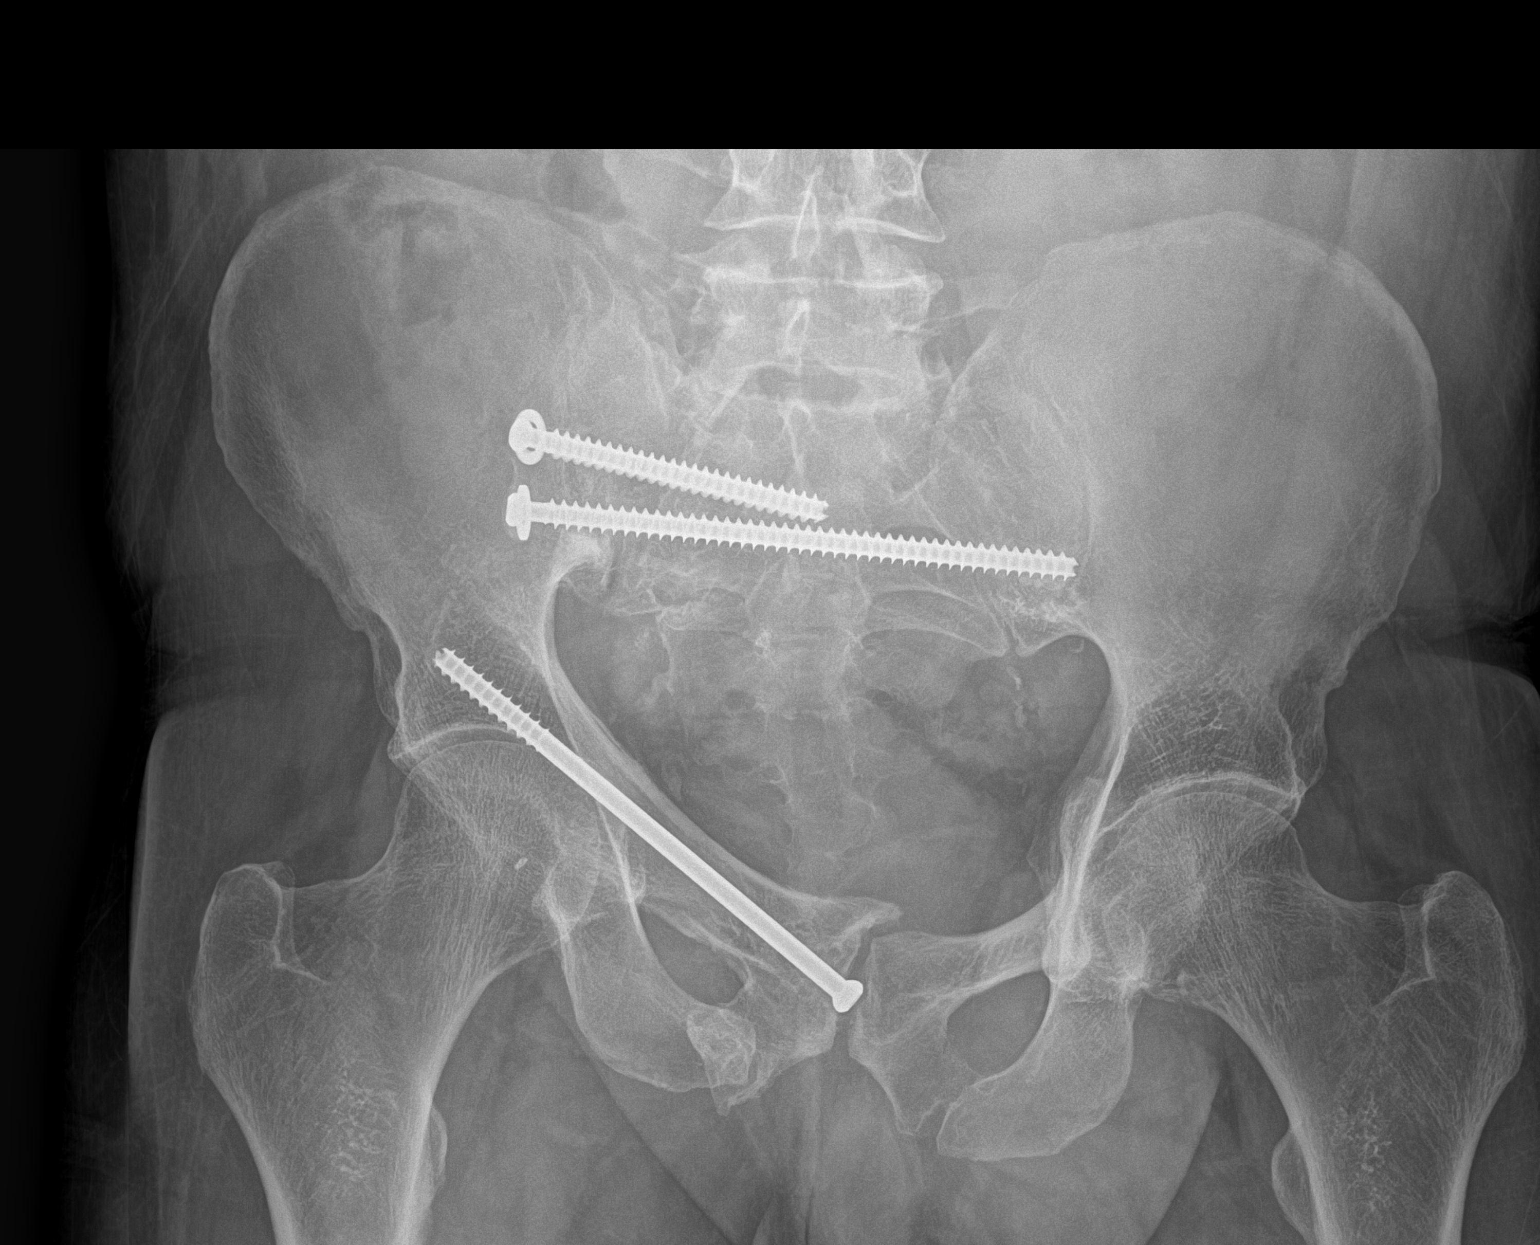

[hip ap]
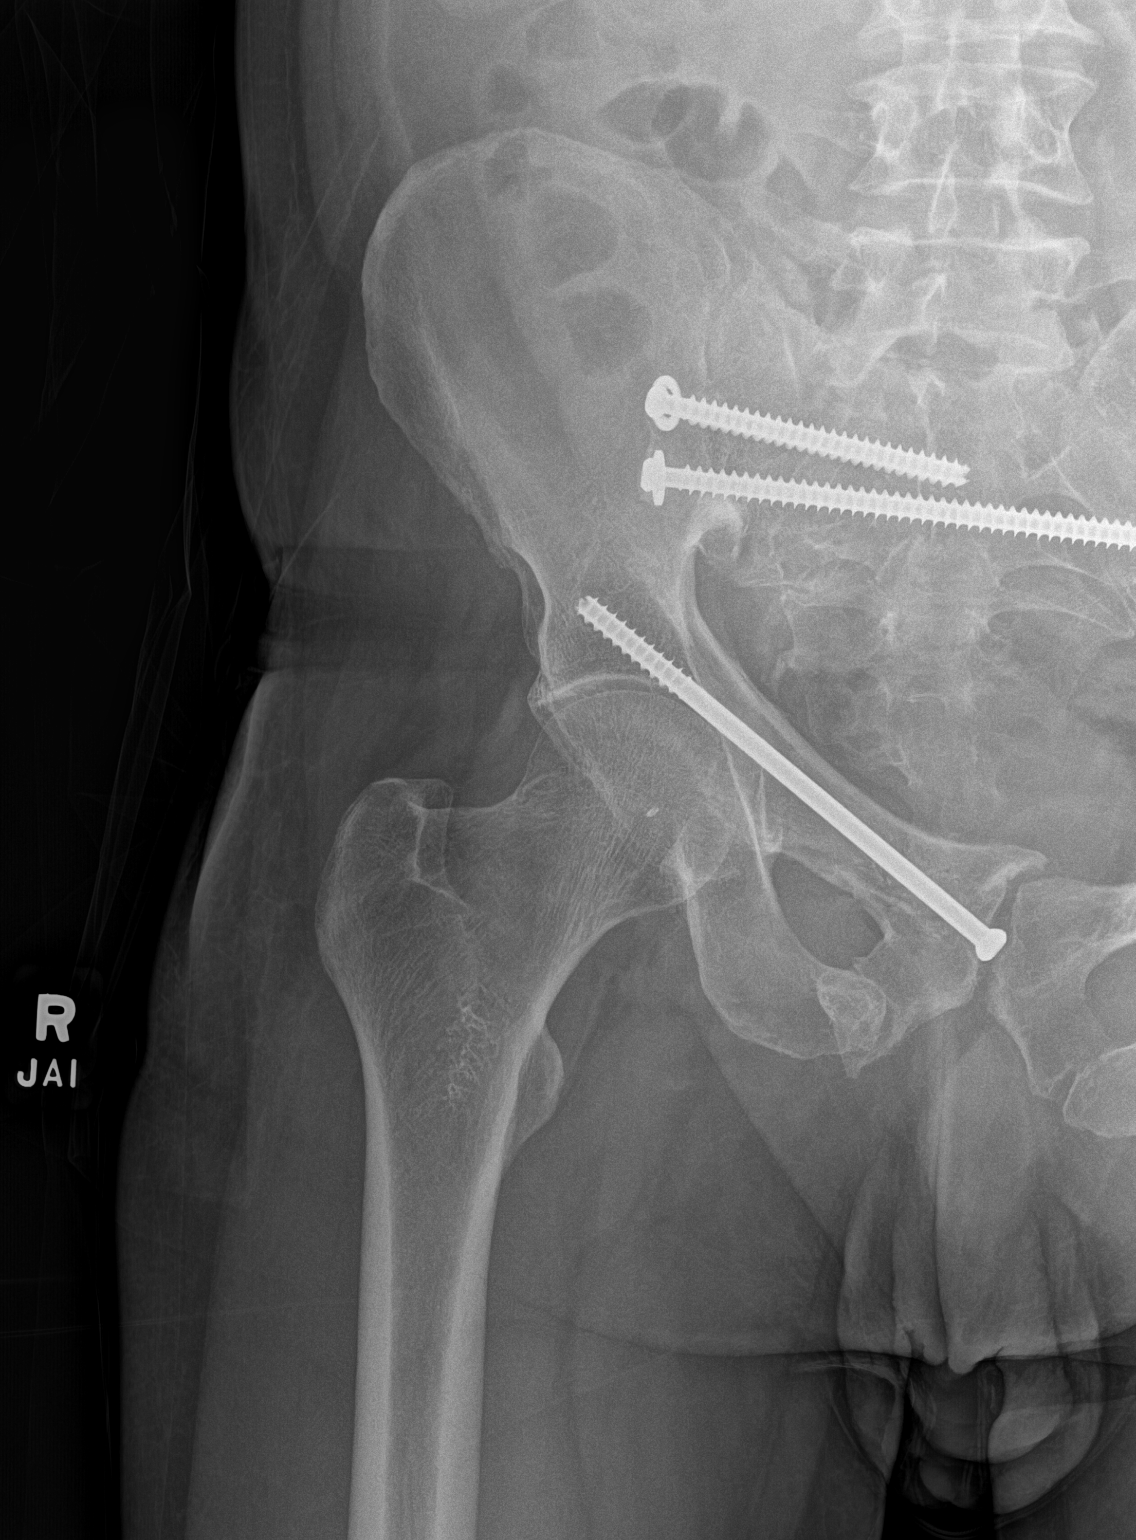

[hip frog leg]
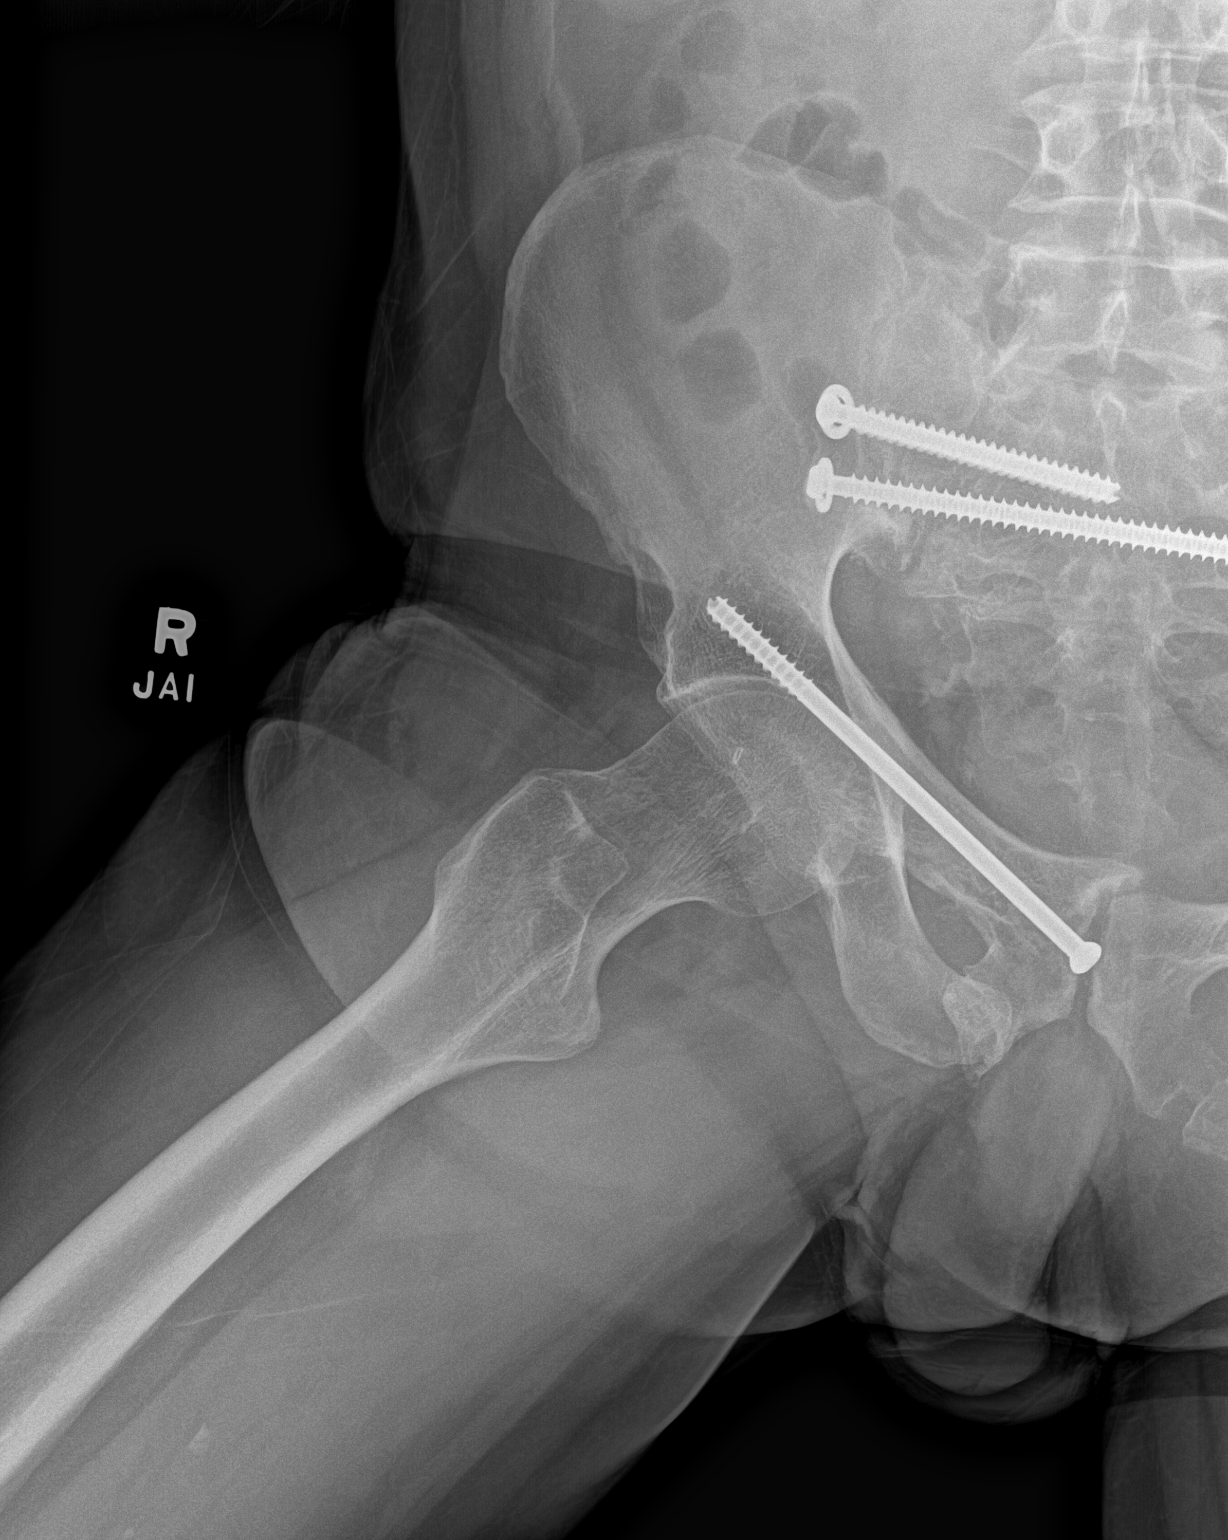

[3 of 3 positions shown; findings below may reference images not displayed]

FINDINGS: There is no evidence of acute hip fracture or dislocation. Status
post surgical fusion of right sacroiliac joint. Status post surgical
internal fixation of old right acetabular fracture. Old fractures
are seen involving both inferior pubic rami. Moderate narrowing of
left hip joint is noted with osteophyte formation.
IMPRESSION: Moderate degenerative joint disease of the left hip. No acute
abnormality seen in the right hip.

## 2020-09-06 MED ORDER — PREDNISONE 20 MG PO TABS: 40.0000 mg | ORAL_TABLET | Freq: Every day | ORAL | 0 refills | Status: DC

## 2020-09-06 MED FILL — predniSONE 20 MG TABS: 20 | 5 days supply | Qty: 10 | Fill #0
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
## 2020-10-17 ENCOUNTER — Other Ambulatory Visit: Payer: Self-pay | Admitting: Internal Medicine

## 2020-10-17 DIAGNOSIS — F411 Generalized anxiety disorder: Secondary | ICD-10-CM

## 2020-10-17 MED FILL — SERTRALINE HCL 100 MG TAB: 100 | 90 days supply | Qty: 90 | Fill #0

## 2020-10-17 MED FILL — ROSUVASTATIN CALCIUM 20 MG: 20 | 90 days supply | Qty: 90 | Fill #0

## 2020-10-17 MED FILL — PANTOPRAZOLE SOD DR 40 MG T: 40 | 90 days supply | Qty: 90 | Fill #0

## 2020-11-03 MED FILL — GABAPENTIN 600 MG TABLET: 600 | 90 days supply | Qty: 270 | Fill #1

## 2020-11-25 ENCOUNTER — Other Ambulatory Visit: Payer: Self-pay

## 2020-11-25 ENCOUNTER — Other Ambulatory Visit: Payer: Self-pay | Admitting: Orthopedic Surgery

## 2020-11-25 ENCOUNTER — Emergency Department
Admission: EM | Admit: 2020-11-25 | Discharge: 2020-11-25 | Disposition: A | Discharge status: Home / Self Care | Payer: No Typology Code available for payment source | Attending: Emergency Medicine | Admitting: Emergency Medicine

## 2020-11-25 ENCOUNTER — Encounter: Payer: Self-pay | Admitting: Emergency Medicine

## 2020-11-25 DIAGNOSIS — S0031XA Abrasion of nose, initial encounter: Secondary | ICD-10-CM | POA: Diagnosis not present

## 2020-11-25 DIAGNOSIS — S0081XA Abrasion of other part of head, initial encounter: Secondary | ICD-10-CM

## 2020-11-25 DIAGNOSIS — W19XXXA Unspecified fall, initial encounter: Secondary | ICD-10-CM

## 2020-11-25 DIAGNOSIS — F17210 Nicotine dependence, cigarettes, uncomplicated: Secondary | ICD-10-CM | POA: Insufficient documentation

## 2020-11-25 DIAGNOSIS — S61412A Laceration without foreign body of left hand, initial encounter: Secondary | ICD-10-CM

## 2020-11-25 DIAGNOSIS — S60419A Abrasion of unspecified finger, initial encounter: Secondary | ICD-10-CM

## 2020-11-25 DIAGNOSIS — S61422A Laceration with foreign body of left hand, initial encounter: Secondary | ICD-10-CM | POA: Diagnosis not present

## 2020-11-25 DIAGNOSIS — S60922A Unspecified superficial injury of left hand, initial encounter: Secondary | ICD-10-CM | POA: Diagnosis present

## 2020-11-25 DIAGNOSIS — W010XXA Fall on same level from slipping, tripping and stumbling without subsequent striking against object, initial encounter: Secondary | ICD-10-CM | POA: Diagnosis not present

## 2020-11-25 DIAGNOSIS — Z87828 Personal history of other (healed) physical injury and trauma: Secondary | ICD-10-CM | POA: Diagnosis not present

## 2020-11-25 DIAGNOSIS — Y9301 Activity, walking, marching and hiking: Secondary | ICD-10-CM | POA: Diagnosis not present

## 2020-11-25 MED ORDER — LIDOCAINE HCL (PF) 1 % IJ SOLN
2.0000 mL | Freq: Once | INTRAMUSCULAR | Status: AC
  Administered 2020-11-25: 2 mL
  Filled 2020-11-25: qty 5

## 2020-11-25 MED ORDER — AMOXICILLIN-POT CLAVULANATE 875-125 MG PO TABS: 1.0000 | ORAL_TABLET | Freq: Two times a day (BID) | ORAL | 0 refills | Status: DC

## 2020-11-25 MED ORDER — AMOXICILLIN-POT CLAVULANATE 875-125 MG PO TABS
1.0000 | ORAL_TABLET | Freq: Once | ORAL | Status: AC
  Administered 2020-11-25: 1 via ORAL
  Filled 2020-11-25: qty 1

## 2020-11-25 MED ORDER — DOUBLE ANTIBIOTIC 500-10000 UNIT/GM EX OINT
TOPICAL_OINTMENT | CUTANEOUS | Status: AC
  Administered 2020-11-25: 1 via TOPICAL
  Filled 2020-11-25: qty 28.4

## 2020-11-25 MED ORDER — BACITRACIN ZINC 500 UNIT/GM EX OINT: TOPICAL_OINTMENT | CUTANEOUS | Status: DC

## 2020-11-25 NOTE — ED Triage Notes (Signed)
Pt presents to ER from home, reports he was walking from the store back home and fell on gravel face forward. Pt reports has a history of neuropathy to lower extremities, reports uses cane PRN. Pt denies loosing consciousness, takes a baby aspirin. Pt is alert and oriented X4.  Pt denies any other symptom.
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
## 2020-11-25 NOTE — ED Notes (Signed)
Pt alert and oriented X 4, stable for discharge. RR even and unlabored, color WNL. Discussed discharge instructions and follow-up as directed. Discharge medications discussed if prescribed. Pt had opportunity to ask questions, and RN to provide patient/family eduction.  

## 2020-11-25 NOTE — Discharge Instructions (Signed)
Please continue with antibiotic ointment daily to facial/hand abrasions.  Keep all abrasions and left hand laceration site clean.  You may shower and get abrasions/laceration site wet but do not submerge underwater.  Take prophylactic antibiotics as prescribed for 1 week.  Follow-up in 1 week with PCP, walk-in clinic or urgent care or emergency department for suture removal.  Return to the ER sooner for any worsening symptoms or changes in health.

## 2020-11-27 MED FILL — AMOX TR-K CLV 875-125 MG TA: 875-125 | 7 days supply | Qty: 14 | Fill #0

## 2020-12-01 ENCOUNTER — Telehealth: Payer: Self-pay | Admitting: Internal Medicine

## 2020-12-01 NOTE — Telephone Encounter (Signed)
Pt has been scheduled for 3/15 at 9:20am for a stitch removal. Stitches were placed at the ED. Pt's last OV with PCP was 07/11/20. However, if you would like this to be moved to the nurse schedule let me know.

## 2020-12-01 NOTE — Telephone Encounter (Signed)
Patients wife called and was wondering if Dr. Ronnald Ramp would be willing to put in an order for the patient to have stiches removed. Please advise

## 2020-12-05 ENCOUNTER — Other Ambulatory Visit: Payer: Self-pay

## 2020-12-05 ENCOUNTER — Ambulatory Visit (INDEPENDENT_AMBULATORY_CARE_PROVIDER_SITE_OTHER): Payer: No Typology Code available for payment source | Admitting: Internal Medicine

## 2020-12-05 ENCOUNTER — Encounter: Payer: Self-pay | Admitting: Internal Medicine

## 2020-12-05 VITALS — BP 106/70 | HR 65 | Temp 98.1°F | Ht 69.0 in | Wt 155.0 lb

## 2020-12-05 DIAGNOSIS — Z5189 Encounter for other specified aftercare: Secondary | ICD-10-CM | POA: Diagnosis not present

## 2020-12-05 NOTE — Progress Notes (Signed)
   Subjective:  Patient ID: Taylor Ochoa, male    DOB: September 04, 1956  Age: 65 y.o. MRN: [id number]  CC: Wound Check  This visit occurred during the SARS-CoV-2 public health emergency.  Safety protocols were in place, including screening questions prior to the visit, additional usage of staff PPE, and extensive cleaning of exam room while observing appropriate contact time as indicated for disinfecting solutions.      HPI Taylor Ochoa presents for a wound check - He injured his left hand a week ago and received sutures. The would has healed. No complains. He wants to have the sutures reoved.  Outpatient Medications Prior to Visit  Medication Sig Dispense Refill  . acetaminophen (TYLENOL) 500 MG tablet Take 500 mg by mouth every 6 (six) hours as needed.    . gabapentin (NEURONTIN) 600 MG tablet Take 1 tablet (600 mg total) by mouth 3 (three) times daily. 270 tablet 1  . pantoprazole (PROTONIX) 40 MG tablet TAKE 1 TABLET (40 MG TOTAL) BY MOUTH DAILY. 90 tablet 1  . predniSONE (DELTASONE) 20 MG tablet Take 2 tablets (40 mg total) by mouth daily with breakfast. 10 tablet 0  . rosuvastatin (CRESTOR) 20 MG tablet Take 1 tablet (20 mg total) by mouth daily. 90 tablet 1  . sertraline (ZOLOFT) 100 MG tablet TAKE 1 TABLET BY MOUTH ONCE DAILY 90 tablet 1  . Tiotropium Bromide Monohydrate (SPIRIVA RESPIMAT) 1.25 MCG/ACT AERS Inhale 2 puffs into the lungs daily. 12 g 1  . traMADol (ULTRAM) 50 MG tablet Take 1 tablet (50 mg total) by mouth every 6 (six) hours as needed. 270 tablet 1   No facility-administered medications prior to visit.    ROS Review of Systems  All other systems reviewed and are negative.   Objective:  BP 106/70   Pulse 65   Temp 98.1 F (36.7 C) (Oral)   Ht 5\' 9"  (1.753 m)   Wt 155 lb (70.3 kg)   SpO2 96%   BMI 22.89 kg/m   BP Readings from Last 3 Encounters:  12/05/20 106/70  11/25/20 111/77  09/06/20 122/78    Wt Readings from Last 3 Encounters:  12/05/20 155  lb (70.3 kg)  11/25/20 158 lb (71.7 kg)  09/06/20 162 lb (73.5 kg)    Physical Exam Musculoskeletal:       Hands:     Lab Results  Component Value Date   WBC 8.3 07/11/2020   HGB 13.6 07/11/2020   HCT 40.5 07/11/2020   PLT 219.0 07/11/2020   GLUCOSE 97 07/11/2020   CHOL 165 07/11/2020   TRIG 90.0 07/11/2020   HDL 37.10 (L) 07/11/2020   LDLCALC 110 (H) 07/11/2020   ALT 19 07/11/2020   AST 18 07/11/2020   NA 142 07/11/2020   K 4.5 07/11/2020   CL 106 07/11/2020   CREATININE 0.92 07/11/2020   BUN 19 07/11/2020   CO2 31 07/11/2020   TSH 1.04 07/11/2020   PSA 2.87 07/11/2020   INR 1.13 01/13/2018   HGBA1C 6.2 07/11/2020    No results found.  Assessment & Plan:   [name] was seen today for wound check.  Diagnoses and all orders for this visit:  Encounter for wound re-check   I am having [name] maintain his acetaminophen, gabapentin, Spiriva Respimat, traMADol, rosuvastatin, predniSONE, sertraline, and pantoprazole.  No orders of the defined types were placed in this encounter.    Follow-up: No follow-ups on file.  Scarlette Calico, MD

## 2020-12-12 ENCOUNTER — Encounter: Payer: Self-pay | Admitting: Internal Medicine

## 2020-12-12 NOTE — Patient Instructions (Signed)
Wound Care, Adult Taking care of your wound properly can help to prevent pain, infection, and scarring. It can also help your wound heal more quickly. Follow instructions from your health care provider about how to care for your wound. Supplies needed:  Soap and water.  Wound cleanser.  Gauze.  If needed, a clean bandage (dressing) or other type of wound dressing material to cover or place in the wound. Follow your health care provider's instructions about what dressing supplies to use.  Cream or ointment to apply to the wound, if told by your health care provider. How to care for your wound Cleaning the wound Ask your health care provider how to clean the wound. This may include:  Using mild soap and water or a wound cleanser.  Using a clean gauze to pat the wound dry after cleaning it. Do not rub or scrub the wound. Dressing care  Wash your hands with soap and water for at least 20 seconds before and after you change the dressing. If soap and water are not available, use hand sanitizer.  Change your dressing as told by your health care provider. This may include: ? Cleaning or rinsing out (irrigating) the wound. ? Placing a dressing over the wound or in the wound (packing). ? Covering the wound with an outer dressing.  Leave any stitches (sutures), skin glue, or adhesive strips in place. These skin closures may need to stay in place for 2 weeks or longer. If adhesive strip edges start to loosen and curl up, you may trim the loose edges. Do not remove adhesive strips completely unless your health care provider tells you to do that.  Ask your health care provider when you can leave the wound uncovered. Checking for infection Check your wound area every day for signs of infection. Check for:  More redness, swelling, or pain.  Fluid or blood.  Warmth.  Pus or a bad smell.   Follow these instructions at home Medicines  If you were prescribed an antibiotic medicine, cream, or  ointment, take or apply it as told by your health care provider. Do not stop using the antibiotic even if your condition improves.  If you were prescribed pain medicine, take it 30 minutes before you do any wound care or as told by your health care provider.  Take over-the-counter and prescription medicines only as told by your health care provider. Eating and drinking  Eat a diet that includes protein, vitamin A, vitamin C, and other nutrient-rich foods to help the wound heal. ? Foods rich in protein include meat, fish, eggs, dairy, beans, and nuts. ? Foods rich in vitamin A include carrots and dark green, leafy vegetables. ? Foods rich in vitamin C include citrus fruits, tomatoes, broccoli, and peppers.  Drink enough fluid to keep your urine pale yellow. General instructions  Do not take baths, swim, use a hot tub, or do anything that would put the wound underwater until your health care provider approves. Ask your health care provider if you may take showers. You may only be allowed to take sponge baths.  Do not scratch or pick at the wound. Keep it covered as told by your health care provider.  Return to your normal activities as told by your health care provider. Ask your health care provider what activities are safe for you.  Protect your wound from the sun when you are outside for the first 6 months, or for as long as told by your health care provider. Cover   up the scar area or apply sunscreen that has an SPF of at least 30.  Do not use any products that contain nicotine or tobacco, such as cigarettes, e-cigarettes, and chewing tobacco. These may delay wound healing. If you need help quitting, ask your health care provider.  Keep all follow-up visits as told by your health care provider. This is important. Contact a health care provider if:  You received a tetanus shot and you have swelling, severe pain, redness, or bleeding at the injection site.  Your pain is not controlled  with medicine.  You have any of these signs of infection: ? More redness, swelling, or pain around the wound. ? Fluid or blood coming from the wound. ? Warmth coming from the wound. ? Pus or a bad smell coming from the wound. ? A fever or chills.  You are nauseous or you vomit.  You are dizzy. Get help right away if:  You have a red streak of skin near the area around your wound.  Your wound has been closed with staples, sutures, skin glue, or adhesive strips and it begins to open up and separate.  Your wound is bleeding, and the bleeding does not stop with gentle pressure.  You have a rash.  You faint.  You have trouble breathing. These symptoms may represent a serious problem that is an emergency. Do not wait to see if the symptoms will go away. Get medical help right away. Call your local emergency services (911 in the [place] Do not drive yourself to the hospital. Summary  Always wash your hands with soap and water for at least 20 seconds before and after changing your dressing.  Change your dressing as told by your health care provider.  To help with healing, eat foods that are rich in protein, vitamin A, vitamin C, and other nutrients.  Check your wound every day for signs of infection. Contact your health care provider if you suspect that your wound is infected. This information is not intended to replace advice given to you by your health care provider. Make sure you discuss any questions you have with your health care provider. Document Revised: 06/25/2019 Document Reviewed: 06/25/2019 Elsevier Patient Education  2021 [employer]  

## 2020-12-18 MED FILL — SPIRIVA RESPIMAT 1.25 MCG I: 1.25 | 90 days supply | Qty: 12 | Fill #1

## 2021-01-17 ENCOUNTER — Other Ambulatory Visit (HOSPITAL_COMMUNITY): Payer: Self-pay

## 2021-01-17 MED FILL — Rosuvastatin Calcium Tab 20 MG: ORAL | 90 days supply | Qty: 90 | Fill #0 | Status: AC

## 2021-01-17 MED FILL — Pantoprazole Sodium EC Tab 40 MG (Base Equiv): ORAL | 90 days supply | Qty: 90 | Fill #0 | Status: AC

## 2021-01-17 MED FILL — Sertraline HCl Tab 100 MG: ORAL | 90 days supply | Qty: 90 | Fill #0 | Status: AC

## 2021-02-04 ENCOUNTER — Emergency Department
Admission: EM | Admit: 2021-02-04 | Discharge: 2021-02-04 | Disposition: A | Discharge status: Home / Self Care | Payer: No Typology Code available for payment source | Attending: Emergency Medicine | Admitting: Emergency Medicine

## 2021-02-04 ENCOUNTER — Encounter: Payer: Self-pay | Admitting: Emergency Medicine

## 2021-02-04 ENCOUNTER — Other Ambulatory Visit: Payer: Self-pay

## 2021-02-04 DIAGNOSIS — S80861A Insect bite (nonvenomous), right lower leg, initial encounter: Secondary | ICD-10-CM | POA: Insufficient documentation

## 2021-02-04 DIAGNOSIS — F17210 Nicotine dependence, cigarettes, uncomplicated: Secondary | ICD-10-CM | POA: Insufficient documentation

## 2021-02-04 DIAGNOSIS — W57XXXA Bitten or stung by nonvenomous insect and other nonvenomous arthropods, initial encounter: Secondary | ICD-10-CM | POA: Insufficient documentation

## 2021-02-04 MED ORDER — DOXYCYCLINE HYCLATE 100 MG PO TBEC: 100.0000 mg | DELAYED_RELEASE_TABLET | Freq: Two times a day (BID) | ORAL | 0 refills | Status: DC

## 2021-02-04 NOTE — ED Triage Notes (Signed)
Pt to ED via POV stating that he thought he had a tick on his right upper leg 2-3 days ago, pt pulled it off, pt states that he now has 2-3 areas on his leg that are red and painful. Pt is in NAD.
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
## 2021-02-04 NOTE — ED Notes (Signed)
See triage note  Presents with possible insect bites to right upper leg couple of days ago    States areas are red with some min swelling

## 2021-02-04 NOTE — Discharge Instructions (Addendum)
Follow-up with your regular doctor if not improving in 2 to 3 days.  Return emergency department worsening. 

## 2021-02-28 ENCOUNTER — Other Ambulatory Visit: Payer: Self-pay | Admitting: Internal Medicine

## 2021-02-28 ENCOUNTER — Other Ambulatory Visit (HOSPITAL_COMMUNITY): Payer: Self-pay

## 2021-02-28 DIAGNOSIS — M5416 Radiculopathy, lumbar region: Secondary | ICD-10-CM

## 2021-02-28 MED ORDER — GABAPENTIN 600 MG PO TABS
600.0000 mg | ORAL_TABLET | Freq: Three times a day (TID) | ORAL | 1 refills | Status: DC
  Filled 2021-02-28: qty 270, 90d supply, fill #0
  Filled 2021-06-12: qty 270, 90d supply, fill #1

## 2021-04-18 ENCOUNTER — Other Ambulatory Visit (HOSPITAL_COMMUNITY): Payer: Self-pay

## 2021-04-18 ENCOUNTER — Other Ambulatory Visit: Payer: Self-pay | Admitting: Internal Medicine

## 2021-04-18 DIAGNOSIS — F411 Generalized anxiety disorder: Secondary | ICD-10-CM

## 2021-04-18 MED ORDER — SERTRALINE HCL 100 MG PO TABS
100.0000 mg | ORAL_TABLET | Freq: Every day | ORAL | 1 refills | Status: DC
  Filled 2021-04-18: qty 90, 90d supply, fill #0
  Filled 2021-08-06: qty 90, 90d supply, fill #1

## 2021-05-22 ENCOUNTER — Other Ambulatory Visit: Payer: Self-pay | Admitting: Internal Medicine

## 2021-05-22 ENCOUNTER — Other Ambulatory Visit (HOSPITAL_COMMUNITY): Payer: Self-pay

## 2021-05-22 MED ORDER — PANTOPRAZOLE SODIUM 40 MG PO TBEC
40.0000 mg | DELAYED_RELEASE_TABLET | Freq: Every day | ORAL | 1 refills | Status: DC
  Filled 2021-05-22: qty 90, 90d supply, fill #0
  Filled 2021-08-23: qty 90, 90d supply, fill #1

## 2021-05-23 ENCOUNTER — Other Ambulatory Visit (HOSPITAL_COMMUNITY): Payer: Self-pay

## 2021-06-12 ENCOUNTER — Other Ambulatory Visit (HOSPITAL_COMMUNITY): Payer: Self-pay

## 2021-07-10 ENCOUNTER — Other Ambulatory Visit (HOSPITAL_COMMUNITY): Payer: Self-pay

## 2021-07-10 ENCOUNTER — Other Ambulatory Visit: Payer: Self-pay | Admitting: Internal Medicine

## 2021-07-10 DIAGNOSIS — J411 Mucopurulent chronic bronchitis: Secondary | ICD-10-CM

## 2021-07-10 MED ORDER — SPIRIVA RESPIMAT 1.25 MCG/ACT IN AERS
2.0000 | INHALATION_SPRAY | Freq: Every day | RESPIRATORY_TRACT | 1 refills | Status: DC
  Filled 2021-07-10: qty 4, 30d supply, fill #0

## 2021-07-25 ENCOUNTER — Other Ambulatory Visit: Payer: Self-pay

## 2021-07-25 ENCOUNTER — Other Ambulatory Visit (HOSPITAL_COMMUNITY): Payer: Self-pay

## 2021-07-25 ENCOUNTER — Encounter: Payer: Self-pay | Admitting: Internal Medicine

## 2021-07-25 ENCOUNTER — Ambulatory Visit (INDEPENDENT_AMBULATORY_CARE_PROVIDER_SITE_OTHER): Payer: Medicare Other | Admitting: Internal Medicine

## 2021-07-25 VITALS — BP 120/72 | HR 67 | Temp 98.7°F | Resp 16 | Ht 69.0 in | Wt 159.0 lb

## 2021-07-25 DIAGNOSIS — J411 Mucopurulent chronic bronchitis: Secondary | ICD-10-CM

## 2021-07-25 DIAGNOSIS — R7303 Prediabetes: Secondary | ICD-10-CM | POA: Diagnosis not present

## 2021-07-25 DIAGNOSIS — Z720 Tobacco use: Secondary | ICD-10-CM

## 2021-07-25 DIAGNOSIS — Z0000 Encounter for general adult medical examination without abnormal findings: Secondary | ICD-10-CM

## 2021-07-25 DIAGNOSIS — K4090 Unilateral inguinal hernia, without obstruction or gangrene, not specified as recurrent: Secondary | ICD-10-CM

## 2021-07-25 DIAGNOSIS — N400 Enlarged prostate without lower urinary tract symptoms: Secondary | ICD-10-CM | POA: Diagnosis not present

## 2021-07-25 DIAGNOSIS — Z23 Encounter for immunization: Secondary | ICD-10-CM | POA: Diagnosis not present

## 2021-07-25 DIAGNOSIS — E785 Hyperlipidemia, unspecified: Secondary | ICD-10-CM

## 2021-07-25 DIAGNOSIS — J431 Panlobular emphysema: Secondary | ICD-10-CM | POA: Diagnosis not present

## 2021-07-25 LAB — BASIC METABOLIC PANEL
BUN: 17 mg/dL (ref 6–23)
CO2: 29 mEq/L (ref 19–32)
Calcium: 9.2 mg/dL (ref 8.4–10.5)
Chloride: 103 mEq/L (ref 96–112)
Creatinine, Ser: 0.99 mg/dL (ref 0.40–1.50)
GFR: 80.11 mL/min (ref >60.00)
Glucose, Bld: 89 mg/dL (ref 70–99)
Potassium: 4.8 mEq/L (ref 3.5–5.1)
Sodium: 138 mEq/L (ref 135–145)

## 2021-07-25 LAB — URINALYSIS, ROUTINE W REFLEX MICROSCOPIC
Bilirubin Urine: NEGATIVE
Hgb urine dipstick: NEGATIVE
Ketones, ur: NEGATIVE
Leukocytes,Ua: NEGATIVE
Nitrite: NEGATIVE
RBC / HPF: NONE SEEN (ref 0–?)
Specific Gravity, Urine: 1.02 (ref 1.000–1.030)
Total Protein, Urine: NEGATIVE
Urine Glucose: NEGATIVE
Urobilinogen, UA: 0.2 (ref 0.0–1.0)
pH: 5.5 (ref 5.0–8.0)

## 2021-07-25 LAB — LIPID PANEL
Cholesterol: 174 mg/dL (ref 0–200)
HDL: 51.4 mg/dL (ref >39.00)
LDL Cholesterol: 91 mg/dL (ref 0–99)
NonHDL: 122.59
Total CHOL/HDL Ratio: 3
Triglycerides: 160 mg/dL — ABNORMAL HIGH (ref 0.0–149.0)
VLDL: 32 mg/dL (ref 0.0–40.0)

## 2021-07-25 LAB — CBC WITH DIFFERENTIAL/PLATELET
Basophils Absolute: 0 10*3/uL (ref 0.0–0.1)
Basophils Relative: 0.4 % (ref 0.0–3.0)
Eosinophils Absolute: 0.1 10*3/uL (ref 0.0–0.7)
Eosinophils Relative: 1 % (ref 0.0–5.0)
HCT: 44.5 % (ref 39.0–52.0)
Hemoglobin: 14.9 g/dL (ref 13.0–17.0)
Lymphocytes Relative: 29.1 % (ref 12.0–46.0)
Lymphs Abs: 2 10*3/uL (ref 0.7–4.0)
MCHC: 33.5 g/dL (ref 30.0–36.0)
MCV: 97 fl (ref 78.0–100.0)
Monocytes Absolute: 0.6 10*3/uL (ref 0.1–1.0)
Monocytes Relative: 9 % (ref 3.0–12.0)
Neutro Abs: 4.2 10*3/uL (ref 1.4–7.7)
Neutrophils Relative %: 60.5 % (ref 43.0–77.0)
Platelets: 232 10*3/uL (ref 150.0–400.0)
RBC: 4.59 Mil/uL (ref 4.22–5.81)
RDW: 14.5 % (ref 11.5–15.5)
WBC: 7 10*3/uL (ref 4.0–10.5)

## 2021-07-25 LAB — HEPATIC FUNCTION PANEL
ALT: 9 U/L (ref 0–53)
AST: 14 U/L (ref 0–37)
Albumin: 4.1 g/dL (ref 3.5–5.2)
Alkaline Phosphatase: 66 U/L (ref 39–117)
Bilirubin, Direct: 0.1 mg/dL (ref 0.0–0.3)
Total Bilirubin: 0.9 mg/dL (ref 0.2–1.2)
Total Protein: 6.8 g/dL (ref 6.0–8.3)

## 2021-07-25 LAB — TSH: TSH: 0.65 u[IU]/mL (ref 0.35–5.50)

## 2021-07-25 LAB — HEMOGLOBIN A1C: Hgb A1c MFr Bld: 6 % (ref 4.6–6.5)

## 2021-07-25 LAB — PSA: PSA: 3.56 ng/mL (ref 0.10–4.00)

## 2021-07-25 MED ORDER — BREZTRI AEROSPHERE 160-9-4.8 MCG/ACT IN AERO
2.0000 | INHALATION_SPRAY | Freq: Two times a day (BID) | RESPIRATORY_TRACT | 1 refills | Status: DC
  Filled 2021-07-25: qty 10.7, 30d supply, fill #0

## 2021-07-25 NOTE — Progress Notes (Signed)
Subjective:  Patient ID: Taylor Ochoa, male    DOB: [birth year]  Age: 65 y.o. MRN: [id number]  CC: Annual Exam, COPD, and Gastroesophageal Reflux  This visit occurred during the SARS-CoV-2 public health emergency.  Safety protocols were in place, including screening questions prior to the visit, additional usage of staff PPE, and extensive cleaning of exam room while observing appropriate contact time as indicated for disinfecting solutions.    HPI Taylor Ochoa presents for a CPX and f/up -  He continues to complain of cough in the morning that is productive of clear phlegm.  He wants to try a new inhaler.  He continues to smoke cigarettes.  He has mild, chronic, unchanged dyspnea on exertion.  He denies chest pain, diaphoresis, dizziness, lightheadedness, hemoptysis, or edema.  Outpatient Medications Prior to Visit  Medication Sig Dispense Refill   acetaminophen (TYLENOL) 500 MG tablet Take 500 mg by mouth every 6 (six) hours as needed.     gabapentin (NEURONTIN) 600 MG tablet Take 1 tablet (600 mg total) by mouth 3 (three) times daily. 270 tablet 1   pantoprazole (PROTONIX) 40 MG tablet Take 1 tablet (40 mg total) by mouth daily. 90 tablet 1   sertraline (ZOLOFT) 100 MG tablet Take 1 tablet (100 mg total) by mouth daily. 90 tablet 1   doxycycline (DORYX) 100 MG EC tablet Take 1 tablet (100 mg total) by mouth 2 (two) times daily. 20 tablet 0   Tiotropium Bromide Monohydrate (SPIRIVA RESPIMAT) 1.25 MCG/ACT AERS INHALE 2 PUFFS INTO THE LUNGS DAILY. 12 g 1   rosuvastatin (CRESTOR) 20 MG tablet TAKE 1 TABLET (20 MG TOTAL) BY MOUTH DAILY. 90 tablet 1   gabapentin (NEURONTIN) 600 MG tablet TAKE 1 TABLET BY MOUTH THREE TIMES DAILY. 90 tablet 1   No facility-administered medications prior to visit.    ROS Review of Systems  Constitutional:  Positive for unexpected weight change (wt gain). Negative for chills, diaphoresis, fatigue and fever.  HENT: Negative.    Eyes: Negative.    Respiratory:  Positive for cough and shortness of breath. Negative for chest tightness and wheezing.   Cardiovascular:  Negative for chest pain, palpitations and leg swelling.  Gastrointestinal:  Negative for abdominal pain, constipation, diarrhea, nausea and vomiting.  Endocrine: Negative.   Genitourinary: Negative.  Negative for difficulty urinating, dysuria, scrotal swelling, testicular pain and urgency.  Musculoskeletal: Negative.  Negative for arthralgias and myalgias.  Skin: Negative.  Negative for color change, pallor and rash.  Neurological: Negative.  Negative for dizziness, weakness, light-headedness, numbness and headaches.  Hematological:  Negative for adenopathy. Does not bruise/bleed easily.  Psychiatric/Behavioral: Negative.     Objective:  BP 120/72 (BP Location: Right Arm, Patient Position: Sitting, Cuff Size: Large)   Pulse 67   Temp 98.7 F (37.1 C) (Oral)   Resp 16   Ht 5\' 9"  (1.753 m)   Wt 159 lb (72.1 kg)   SpO2 97%   BMI 23.48 kg/m   BP Readings from Last 3 Encounters:  07/26/21 126/78  07/25/21 120/72  02/04/21 125/78    Wt Readings from Last 3 Encounters:  07/26/21 159 lb (72.1 kg)  07/25/21 159 lb (72.1 kg)  02/04/21 155 lb (70.3 kg)    Physical Exam Vitals reviewed.  Constitutional:      Appearance: Normal appearance.  HENT:     Nose: Nose normal.     Mouth/Throat:     Mouth: Mucous membranes are moist.  Eyes:  General: No scleral icterus.    Conjunctiva/sclera: Conjunctivae normal.  Cardiovascular:     Rate and Rhythm: Normal rate and regular rhythm.     Pulses:          Dorsalis pedis pulses are 1+ on the right side and 1+ on the left side.       Posterior tibial pulses are 1+ on the right side and 1+ on the left side.  Pulmonary:     Effort: Pulmonary effort is normal.     Breath sounds: No stridor. No wheezing, rhonchi or rales.  Abdominal:     General: Abdomen is flat.     Palpations: There is no mass.     Tenderness: There  is no abdominal tenderness. There is no guarding or rebound.     Hernia: A hernia is present. Hernia is present in the left inguinal area and right inguinal area.  Genitourinary:    Pubic Area: No rash.      Penis: Normal. No lesions.      Testes: Normal.     Epididymis:     Right: Normal. No mass.     Left: Normal. No mass.     Prostate: Enlarged. Not tender and no nodules present.     Rectum: Normal. Guaiac result negative. No mass, tenderness, anal fissure, external hemorrhoid or internal hemorrhoid. Normal anal tone.     Comments: Nonreducible left inguinal hernia containing some fat. Musculoskeletal:        General: No swelling. Normal range of motion.     Cervical back: Neck supple.     Right lower leg: No edema.     Left lower leg: No edema.  Feet:     Right foot:     Skin integrity: Skin integrity normal.     Toenail Condition: Right toenails are abnormally thick and long.     Left foot:     Skin integrity: Skin integrity normal.     Toenail Condition: Left toenails are abnormally thick and long.  Lymphadenopathy:     Cervical: No cervical adenopathy.     Lower Body: No right inguinal adenopathy. No left inguinal adenopathy.  Skin:    General: Skin is warm and dry.  Neurological:     General: No focal deficit present.     Mental Status: He is alert and oriented to person, place, and time. Mental status is at baseline.  Psychiatric:        Mood and Affect: Mood normal.        Behavior: Behavior normal.    Lab Results  Component Value Date   WBC 7.0 07/25/2021   HGB 14.9 07/25/2021   HCT 44.5 07/25/2021   PLT 232.0 07/25/2021   GLUCOSE 89 07/25/2021   CHOL 174 07/25/2021   TRIG 160.0 (H) 07/25/2021   HDL 51.40 07/25/2021   LDLCALC 91 07/25/2021   ALT 9 07/25/2021   AST 14 07/25/2021   NA 138 07/25/2021   K 4.8 07/25/2021   CL 103 07/25/2021   CREATININE 0.99 07/25/2021   BUN 17 07/25/2021   CO2 29 07/25/2021   TSH 0.65 07/25/2021   PSA 3.56 07/25/2021    INR 1.13 01/13/2018   HGBA1C 6.0 07/25/2021    No results found.  Assessment & Plan:   Abhinav was seen today for annual exam, copd and gastroesophageal reflux.  Diagnoses and all orders for this visit:  Flu vaccine need -     Flu Vaccine QUAD High Dose(Fluad)  Mucopurulent  chronic bronchitis ([place])- Will upgrade to a LAMA/LABA/ICS inhaler. -     Ambulatory Referral for Lung Cancer Scre -     BREZTRI AEROSPHERE 160-9-4.8 MCG/ACT AERO; Inhale 2 puffs into the lungs 2 (two) times daily. -     CBC with Differential/Platelet; Future -     CBC with Differential/Platelet  Tobacco abuse -     Ambulatory Referral for Lung Cancer Scre  Panlobular emphysema ([place]) -     Ambulatory Referral for Lung Cancer Scre  Hyperlipidemia with target LDL less than 130- LDL goal achieved. Doing well on the statin  -     Lipid panel; Future -     TSH; Future -     Hepatic function panel; Future -     Hepatic function panel -     TSH -     Lipid panel  Prediabetes- His A1c is mildly elevated.  Medical therapy is not indicated. -     Basic metabolic panel; Future -     Hemoglobin A1c; Future -     Hemoglobin A1c -     Basic metabolic panel  Routine general medical examination at a health care facility- Exam completed, labs reviewed, vaccines reviewed and updated, cancer screenings addressed, patient education material was given.  Benign prostatic hyperplasia without lower urinary tract symptoms- His PSA is in the upper limits of the normal range.  Will continue to follow. -     PSA; Future -     Urinalysis, Routine w reflex microscopic; Future -     Urinalysis, Routine w reflex microscopic -     PSA  Left inguinal hernia -     Ambulatory referral to General Surgery  I have discontinued Delmont L. Seubert's doxycycline and Spiriva Respimat. I am also having him start on [employer]. Additionally, I am having him maintain his acetaminophen, rosuvastatin, gabapentin, sertraline, and  pantoprazole.  Meds ordered this encounter  Medications   BREZTRI AEROSPHERE 160-9-4.8 MCG/ACT AERO    Sig: Inhale 2 puffs into the lungs 2 (two) times daily.    Dispense:  32.1 g    Refill:  1     Follow-up: Return in about 6 months (around 01/22/2022).  Scarlette Calico, MD

## 2021-07-25 NOTE — Patient Instructions (Signed)
Health Maintenance, Male Adopting a healthy lifestyle and getting preventive care are important in promoting health and wellness. Ask your health care provider about: The right schedule for you to have regular tests and exams. Things you can do on your own to prevent diseases and keep yourself healthy. What should I know about diet, weight, and exercise? Eat a healthy diet  Eat a diet that includes plenty of vegetables, fruits, low-fat dairy products, and lean protein. Do not eat a lot of foods that are high in solid fats, added sugars, or sodium. Maintain a healthy weight Body mass index (BMI) is a measurement that can be used to identify possible weight problems. It estimates body fat based on height and weight. Your health care provider can help determine your BMI and help you achieve or maintain a healthy weight. Get regular exercise Get regular exercise. This is one of the most important things you can do for your health. Most adults should: Exercise for at least 150 minutes each week. The exercise should increase your heart rate and make you sweat (moderate-intensity exercise). Do strengthening exercises at least twice a week. This is in addition to the moderate-intensity exercise. Spend less time sitting. Even light physical activity can be beneficial. Watch cholesterol and blood lipids Have your blood tested for lipids and cholesterol at 65 years of age, then have this test every 5 years. You may need to have your cholesterol levels checked more often if: Your lipid or cholesterol levels are high. You are older than 65 years of age. You are at high risk for heart disease. What should I know about cancer screening? Many types of cancers can be detected early and may often be prevented. Depending on your health history and family history, you may need to have cancer screening at various ages. This may include screening for: Colorectal cancer. Prostate cancer. Skin cancer. Lung  cancer. What should I know about heart disease, diabetes, and high blood pressure? Blood pressure and heart disease High blood pressure causes heart disease and increases the risk of stroke. This is more likely to develop in people who have high blood pressure readings, are of African descent, or are overweight. Talk with your health care provider about your target blood pressure readings. Have your blood pressure checked: Every 3-5 years if you are 18-39 years of age. Every year if you are 40 years old or older. If you are between the ages of 65 and 75 and are a current or former smoker, ask your health care provider if you should have a one-time screening for abdominal aortic aneurysm (AAA). Diabetes Have regular diabetes screenings. This checks your fasting blood sugar level. Have the screening done: Once every three years after age 45 if you are at a normal weight and have a low risk for diabetes. More often and at a younger age if you are overweight or have a high risk for diabetes. What should I know about preventing infection? Hepatitis B If you have a higher risk for hepatitis B, you should be screened for this virus. Talk with your health care provider to find out if you are at risk for hepatitis B infection. Hepatitis C Blood testing is recommended for: Everyone born from 1945 through 1965. Anyone with known risk factors for hepatitis C. Sexually transmitted infections (STIs) You should be screened each year for STIs, including gonorrhea and chlamydia, if: You are sexually active and are younger than 65 years of age. You are older than 65 years   of age and your health care provider tells you that you are at risk for this type of infection. Your sexual activity has changed since you were last screened, and you are at increased risk for chlamydia or gonorrhea. Ask your health care provider if you are at risk. Ask your health care provider about whether you are at high risk for HIV.  Your health care provider may recommend a prescription medicine to help prevent HIV infection. If you choose to take medicine to prevent HIV, you should first get tested for HIV. You should then be tested every 3 months for as long as you are taking the medicine. Follow these instructions at home: Lifestyle Do not use any products that contain nicotine or tobacco, such as cigarettes, e-cigarettes, and chewing tobacco. If you need help quitting, ask your health care provider. Do not use street drugs. Do not share needles. Ask your health care provider for help if you need support or information about quitting drugs. Alcohol use Do not drink alcohol if your health care provider tells you not to drink. If you drink alcohol: Limit how much you have to 0-2 drinks a day. Be aware of how much alcohol is in your drink. In the [place] one drink equals one 12 oz bottle of beer (355 mL), one 5 oz glass of wine (148 mL), or one 1 oz glass of hard liquor (44 mL). General instructions Schedule regular health, dental, and eye exams. Stay current with your vaccines. Tell your health care provider if: You often feel depressed. You have ever been abused or do not feel safe at home. Summary Adopting a healthy lifestyle and getting preventive care are important in promoting health and wellness. Follow your health care provider's instructions about healthy diet, exercising, and getting tested or screened for diseases. Follow your health care provider's instructions on monitoring your cholesterol and blood pressure. This information is not intended to replace advice given to you by your health care provider. Make sure you discuss any questions you have with your health care provider. Document Revised: 11/17/2020 Document Reviewed: 09/02/2018 Elsevier Patient Education  2022 [employer] Inc.  

## 2021-07-26 ENCOUNTER — Other Ambulatory Visit: Payer: Self-pay

## 2021-07-26 ENCOUNTER — Emergency Department
Admission: EM | Admit: 2021-07-26 | Discharge: 2021-07-26 | Disposition: A | Discharge status: Home / Self Care | Payer: Medicare Other | Attending: Emergency Medicine | Admitting: Emergency Medicine

## 2021-07-26 ENCOUNTER — Emergency Department: Payer: Medicare Other

## 2021-07-26 DIAGNOSIS — S2242XA Multiple fractures of ribs, left side, initial encounter for closed fracture: Secondary | ICD-10-CM | POA: Insufficient documentation

## 2021-07-26 DIAGNOSIS — S299XXA Unspecified injury of thorax, initial encounter: Secondary | ICD-10-CM | POA: Diagnosis present

## 2021-07-26 DIAGNOSIS — N138 Other obstructive and reflux uropathy: Secondary | ICD-10-CM | POA: Insufficient documentation

## 2021-07-26 DIAGNOSIS — J439 Emphysema, unspecified: Secondary | ICD-10-CM | POA: Insufficient documentation

## 2021-07-26 DIAGNOSIS — R0781 Pleurodynia: Secondary | ICD-10-CM | POA: Diagnosis not present

## 2021-07-26 DIAGNOSIS — F17210 Nicotine dependence, cigarettes, uncomplicated: Secondary | ICD-10-CM | POA: Diagnosis not present

## 2021-07-26 DIAGNOSIS — J430 Unilateral pulmonary emphysema [MacLeod's syndrome]: Secondary | ICD-10-CM | POA: Diagnosis not present

## 2021-07-26 DIAGNOSIS — X58XXXA Exposure to other specified factors, initial encounter: Secondary | ICD-10-CM | POA: Diagnosis not present

## 2021-07-26 DIAGNOSIS — N400 Enlarged prostate without lower urinary tract symptoms: Secondary | ICD-10-CM | POA: Insufficient documentation

## 2021-07-26 DIAGNOSIS — K4090 Unilateral inguinal hernia, without obstruction or gangrene, not specified as recurrent: Secondary | ICD-10-CM | POA: Insufficient documentation

## 2021-07-26 IMAGING — CR DG RIBS W/ CHEST 3+V*L*
1 series · 5 of 5 positions shown · non-contrast
Comparison: Chest CT [DATE]

CLINICAL DATA: Rib pain

EXAM:
LEFT RIBS AND CHEST - 3+ VIEW

[Series 1: dg ribs unilateral w/chest left · 0.14mm/px · 5 of 5 slices shown]
[im 1/5]
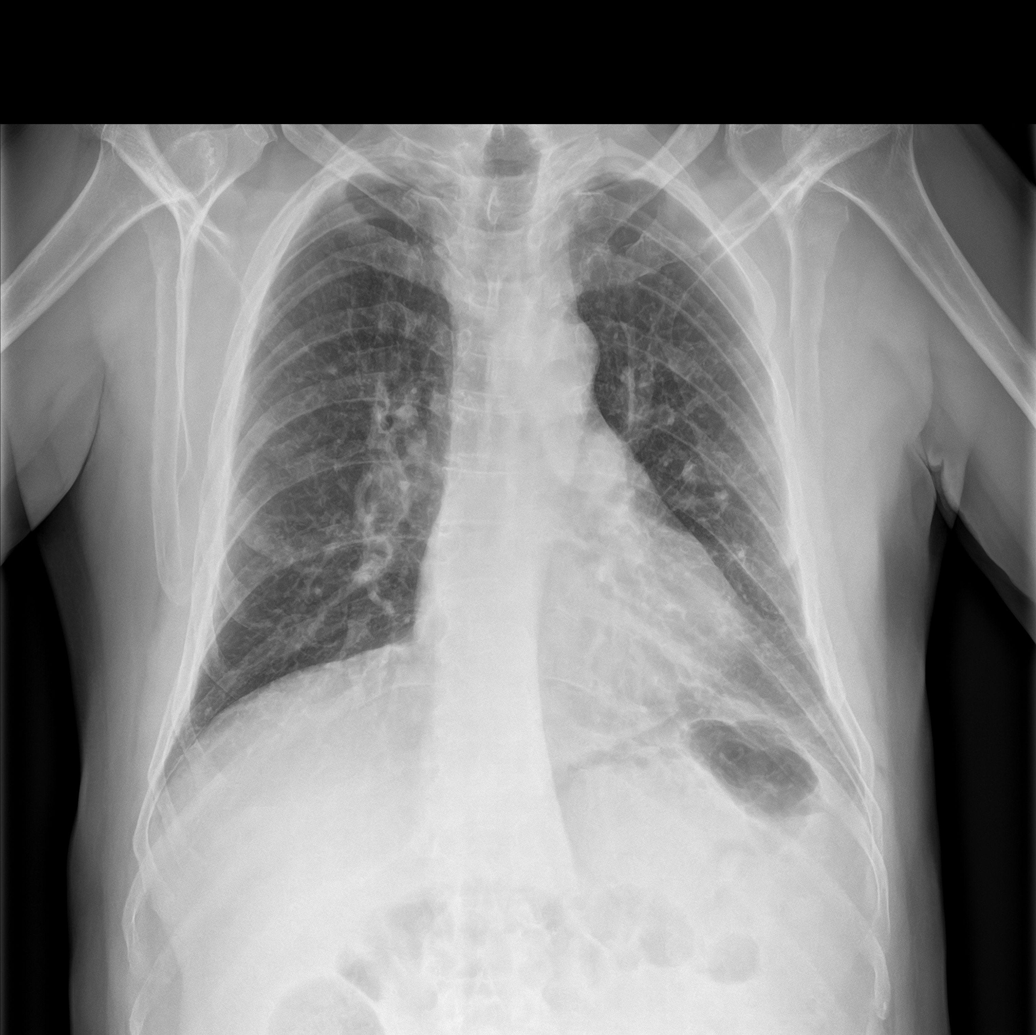
[im 2/5]
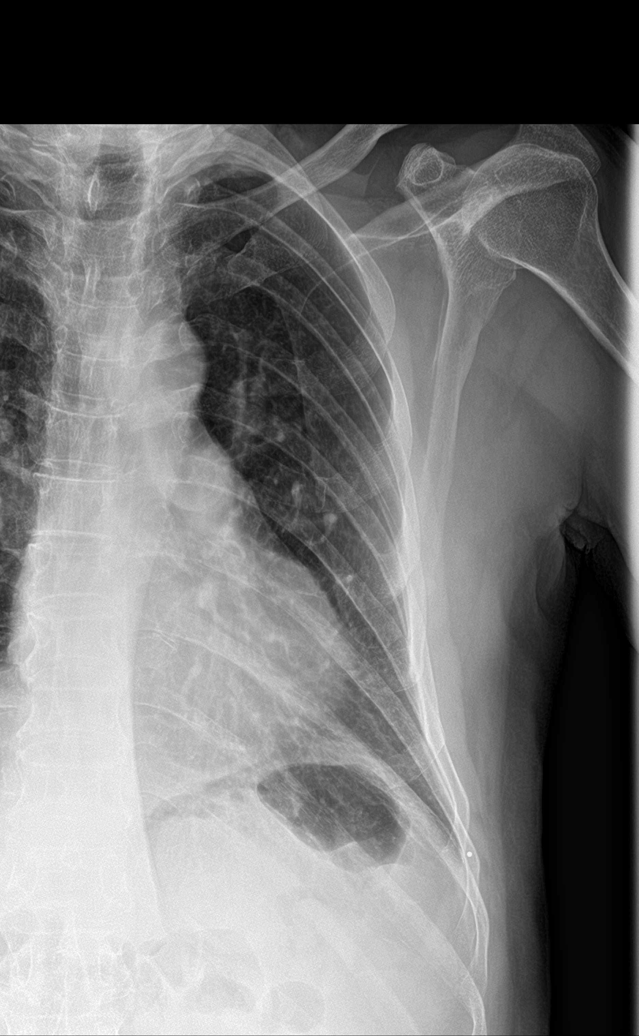
[im 3/5]
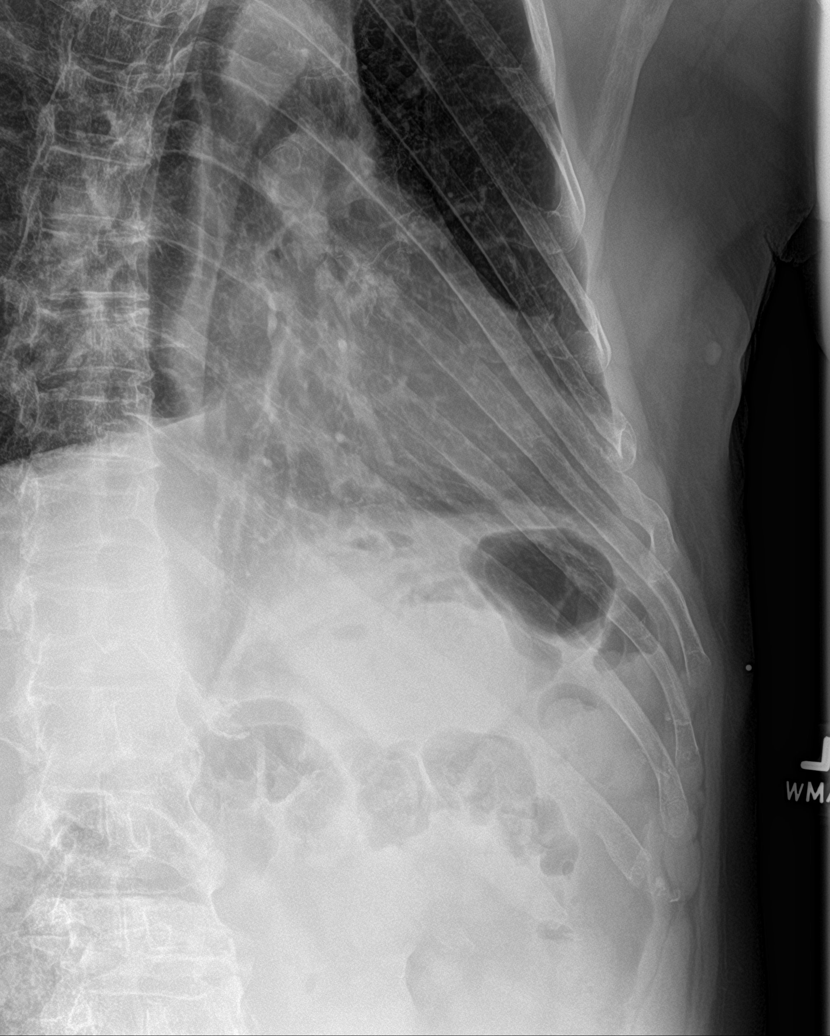
[im 4/5]
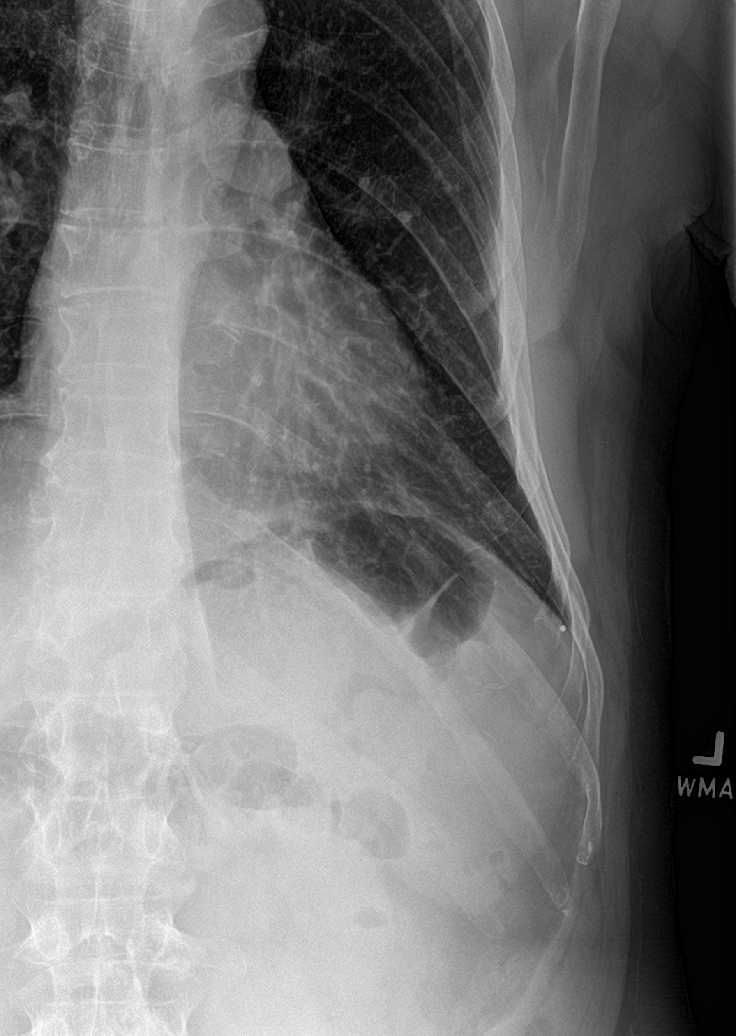
[im 5/5]
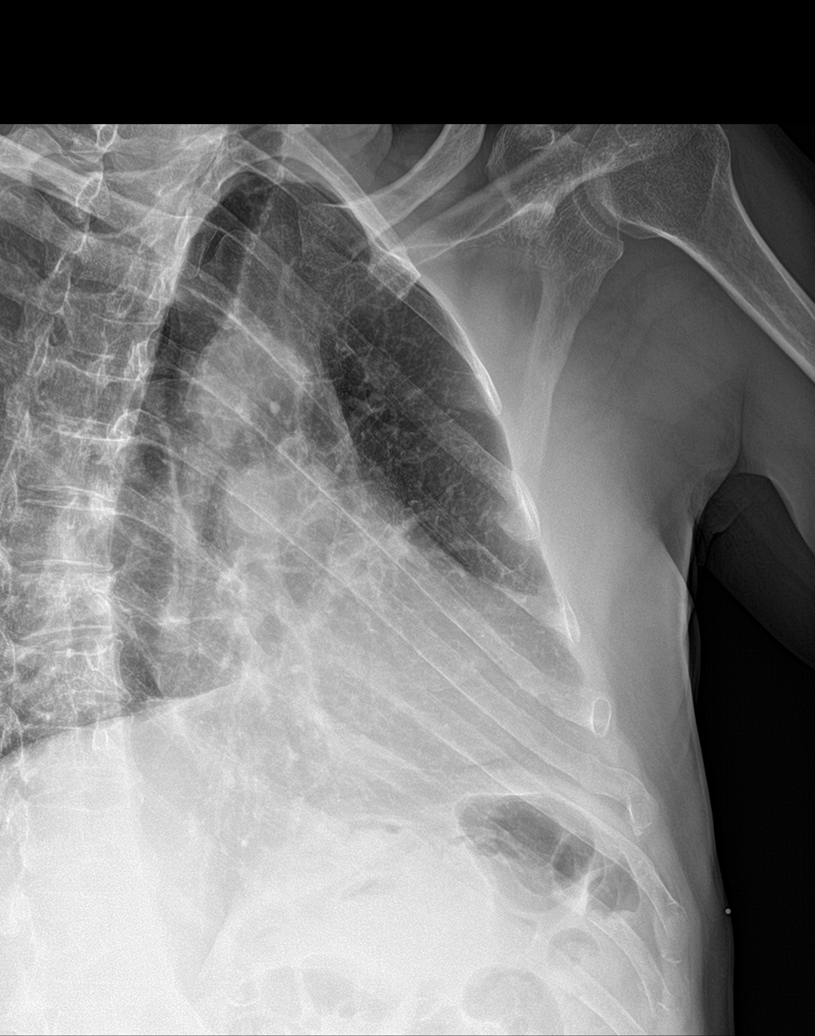

[5 of 5 positions shown; findings below may reference images not displayed]

FINDINGS: Unchanged cardiomediastinal silhouette. Chronic interstitial
opacities. No new focal airspace disease. There is no large pleural
effusion or visible pneumothorax. There is mild irregularity of the
anterior left anterior sixth and seventh ribs.
IMPRESSION: Mild irregularity of the left anterior sixth and seventh ribs, could
represent nondisplaced fractures, correlate with point tenderness.

No evidence of acute cardiopulmonary disease.

## 2021-07-26 MED ORDER — LIDOCAINE 5 % EX PTCH: 1.0000 | MEDICATED_PATCH | Freq: Two times a day (BID) | CUTANEOUS | 0 refills | Status: AC | PRN

## 2021-07-26 MED ORDER — LIDOCAINE 5 % EX PTCH
1.0000 | MEDICATED_PATCH | Freq: Once | CUTANEOUS | Status: DC
  Administered 2021-07-26: 1 via TRANSDERMAL
  Filled 2021-07-26: qty 1

## 2021-07-26 MED ORDER — GUAIFENESIN-CODEINE 100-10 MG/5ML PO SOLN: 5.0000 mL | Freq: Three times a day (TID) | ORAL | 0 refills | Status: DC | PRN
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
## 2021-07-26 NOTE — ED Notes (Signed)
Pt. Discharged prior to RN assessment. 

## 2021-07-26 NOTE — ED Triage Notes (Signed)
Pt to ER via [facility] with complaints of left sided rib pain after having a coughing spell yesterday. Reports difficulty taking deep breaths and walking due to pain.
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
## 2021-07-30 ENCOUNTER — Other Ambulatory Visit (HOSPITAL_COMMUNITY): Payer: Self-pay

## 2021-07-31 ENCOUNTER — Encounter: Payer: Self-pay | Admitting: Internal Medicine

## 2021-08-02 ENCOUNTER — Other Ambulatory Visit (HOSPITAL_COMMUNITY): Payer: Self-pay

## 2021-08-02 ENCOUNTER — Other Ambulatory Visit: Payer: Self-pay | Admitting: Internal Medicine

## 2021-08-02 DIAGNOSIS — J411 Mucopurulent chronic bronchitis: Secondary | ICD-10-CM

## 2021-08-02 MED ORDER — SPIRIVA RESPIMAT 2.5 MCG/ACT IN AERS
2.0000 | INHALATION_SPRAY | Freq: Every day | RESPIRATORY_TRACT | 1 refills | Status: DC
  Filled 2021-08-02: qty 12, 84d supply, fill #0
  Filled 2021-08-06 – 2021-08-08 (×2): qty 12, 30d supply, fill #0

## 2021-08-06 ENCOUNTER — Other Ambulatory Visit (HOSPITAL_COMMUNITY): Payer: Self-pay

## 2021-08-08 ENCOUNTER — Other Ambulatory Visit (HOSPITAL_COMMUNITY): Payer: Self-pay

## 2021-08-08 NOTE — Telephone Encounter (Signed)
Key: [id number]

## 2021-08-14 ENCOUNTER — Telehealth: Payer: Self-pay | Admitting: Internal Medicine

## 2021-08-14 ENCOUNTER — Telehealth: Payer: Self-pay

## 2021-08-14 NOTE — Telephone Encounter (Signed)
[employer] calling in  [place] PA for medication has been denied bc it does not meet the criteria  Any questions [phone] opt 5

## 2021-08-14 NOTE — Telephone Encounter (Signed)
PA has been started.  Key: [id number]

## 2021-08-21 ENCOUNTER — Other Ambulatory Visit (HOSPITAL_COMMUNITY): Payer: Self-pay

## 2021-08-21 ENCOUNTER — Other Ambulatory Visit: Payer: Self-pay | Admitting: Internal Medicine

## 2021-08-21 DIAGNOSIS — J411 Mucopurulent chronic bronchitis: Secondary | ICD-10-CM

## 2021-08-21 MED ORDER — INCRUSE ELLIPTA 62.5 MCG/ACT IN AEPB
1.0000 | INHALATION_SPRAY | Freq: Every day | RESPIRATORY_TRACT | 1 refills | Status: DC
  Filled 2021-08-21: qty 30, 30d supply, fill #0

## 2021-08-22 ENCOUNTER — Other Ambulatory Visit (HOSPITAL_COMMUNITY): Payer: Self-pay

## 2021-08-23 ENCOUNTER — Other Ambulatory Visit (HOSPITAL_COMMUNITY): Payer: Self-pay

## 2021-08-28 ENCOUNTER — Encounter: Payer: Self-pay | Admitting: Internal Medicine

## 2021-08-28 ENCOUNTER — Other Ambulatory Visit: Payer: Self-pay | Admitting: Internal Medicine

## 2021-08-28 ENCOUNTER — Other Ambulatory Visit (HOSPITAL_COMMUNITY): Payer: Self-pay

## 2021-08-28 DIAGNOSIS — J411 Mucopurulent chronic bronchitis: Secondary | ICD-10-CM

## 2021-08-28 DIAGNOSIS — E785 Hyperlipidemia, unspecified: Secondary | ICD-10-CM

## 2021-08-28 MED ORDER — ROSUVASTATIN CALCIUM 20 MG PO TABS
20.0000 mg | ORAL_TABLET | Freq: Every day | ORAL | 1 refills | Status: DC
  Filled 2021-08-28: qty 90, 90d supply, fill #0

## 2021-08-28 MED ORDER — SPIRIVA RESPIMAT 1.25 MCG/ACT IN AERS
2.0000 | INHALATION_SPRAY | Freq: Every day | RESPIRATORY_TRACT | 1 refills | Status: DC
  Filled 2021-08-28: qty 12, 90d supply, fill #0

## 2021-08-28 MED ORDER — SPIRIVA RESPIMAT 1.25 MCG/ACT IN AERS
2.0000 | INHALATION_SPRAY | Freq: Every day | RESPIRATORY_TRACT | 5 refills | Status: DC
  Filled 2021-08-28: qty 4, 30d supply, fill #0

## 2021-08-28 NOTE — Telephone Encounter (Signed)
Key [id number]

## 2021-08-29 ENCOUNTER — Other Ambulatory Visit: Payer: Self-pay | Admitting: Internal Medicine

## 2021-08-29 ENCOUNTER — Other Ambulatory Visit (HOSPITAL_COMMUNITY): Payer: Self-pay

## 2021-08-29 MED ORDER — PANTOPRAZOLE SODIUM 40 MG PO TBEC
40.0000 mg | DELAYED_RELEASE_TABLET | Freq: Every day | ORAL | 1 refills | Status: DC
  Filled 2021-08-29 – 2021-12-19 (×2): qty 90, 90d supply, fill #0
  Filled 2022-04-24: qty 90, 90d supply, fill #1

## 2021-08-31 ENCOUNTER — Other Ambulatory Visit: Payer: Self-pay | Admitting: *Deleted

## 2021-08-31 DIAGNOSIS — Z87891 Personal history of nicotine dependence: Secondary | ICD-10-CM

## 2021-08-31 DIAGNOSIS — F17210 Nicotine dependence, cigarettes, uncomplicated: Secondary | ICD-10-CM

## 2021-10-04 ENCOUNTER — Other Ambulatory Visit: Payer: Self-pay | Admitting: Internal Medicine

## 2021-10-04 ENCOUNTER — Other Ambulatory Visit (HOSPITAL_COMMUNITY): Payer: Self-pay

## 2021-10-04 DIAGNOSIS — M5416 Radiculopathy, lumbar region: Secondary | ICD-10-CM

## 2021-10-04 MED ORDER — GABAPENTIN 600 MG PO TABS
600.0000 mg | ORAL_TABLET | Freq: Three times a day (TID) | ORAL | 1 refills | Status: DC
  Filled 2021-10-04: qty 270, 90d supply, fill #0
  Filled 2022-01-18: qty 270, 90d supply, fill #1

## 2021-10-10 DIAGNOSIS — K4020 Bilateral inguinal hernia, without obstruction or gangrene, not specified as recurrent: Secondary | ICD-10-CM | POA: Diagnosis not present

## 2021-11-14 ENCOUNTER — Ambulatory Visit (INDEPENDENT_AMBULATORY_CARE_PROVIDER_SITE_OTHER): Payer: Medicare Other | Admitting: Internal Medicine

## 2021-11-14 ENCOUNTER — Encounter: Payer: Self-pay | Admitting: Internal Medicine

## 2021-11-14 ENCOUNTER — Other Ambulatory Visit: Payer: Self-pay

## 2021-11-14 ENCOUNTER — Other Ambulatory Visit (HOSPITAL_COMMUNITY): Payer: Self-pay

## 2021-11-14 VITALS — BP 116/68 | HR 59 | Ht 69.0 in | Wt 154.0 lb

## 2021-11-14 DIAGNOSIS — R972 Elevated prostate specific antigen [PSA]: Secondary | ICD-10-CM | POA: Insufficient documentation

## 2021-11-14 DIAGNOSIS — Z720 Tobacco use: Secondary | ICD-10-CM

## 2021-11-14 DIAGNOSIS — N401 Enlarged prostate with lower urinary tract symptoms: Secondary | ICD-10-CM | POA: Diagnosis not present

## 2021-11-14 DIAGNOSIS — R7303 Prediabetes: Secondary | ICD-10-CM

## 2021-11-14 DIAGNOSIS — R32 Unspecified urinary incontinence: Secondary | ICD-10-CM | POA: Diagnosis not present

## 2021-11-14 DIAGNOSIS — N138 Other obstructive and reflux uropathy: Secondary | ICD-10-CM | POA: Diagnosis not present

## 2021-11-14 LAB — URINALYSIS, ROUTINE W REFLEX MICROSCOPIC
Bilirubin Urine: NEGATIVE
Hgb urine dipstick: NEGATIVE
Ketones, ur: NEGATIVE
Leukocytes,Ua: NEGATIVE
Nitrite: NEGATIVE
RBC / HPF: NONE SEEN (ref 0–?)
Specific Gravity, Urine: 1.02 (ref 1.000–1.030)
Total Protein, Urine: NEGATIVE
Urine Glucose: NEGATIVE
Urobilinogen, UA: 1 (ref 0.0–1.0)
pH: 7 (ref 5.0–8.0)

## 2021-11-14 LAB — PSA: PSA: 2.65 ng/mL (ref 0.10–4.00)

## 2021-11-14 MED ORDER — TAMSULOSIN HCL 0.4 MG PO CAPS
0.4000 mg | ORAL_CAPSULE | Freq: Every day | ORAL | 3 refills | Status: DC
  Filled 2021-11-14: qty 90, 90d supply, fill #0

## 2021-11-14 NOTE — Patient Instructions (Signed)
Please take all new medication as prescribed - the flomax for the prostate to relax  Please continue all other medications as before, and refills have been done if requested.  Please have the pharmacy call with any other refills you may need.  Please keep your appointments with your specialists as you may have planned  You will be contacted regarding the referral for: Urology to see if you need further bladder evaluation or treatment  Please go to the LAB at the blood drawing area for the tests to be done  -  just the urine testing and PSA today  You will be contacted by phone if any changes need to be made immediately.  Otherwise, you will receive a letter about your results with an explanation, but please check with MyChart first.  Please remember to sign up for MyChart if you have not done so, as this will be important to you in the future with finding out test results, communicating by private email, and scheduling acute appointments online when needed.
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
## 2021-11-15 LAB — URINE CULTURE

## 2021-11-18 ENCOUNTER — Encounter: Payer: Self-pay | Admitting: Internal Medicine

## 2021-11-18 NOTE — Assessment & Plan Note (Signed)
Lab Results  Component Value Date   HGBA1C 6.0 07/25/2021   Stable, pt to continue current medical treatment  - diet

## 2021-11-18 NOTE — Assessment & Plan Note (Signed)
Pt counsled to quit, pt not ready °

## 2021-11-18 NOTE — Assessment & Plan Note (Signed)
unocntrolled with recent worsening symptoms, for flomax .4 qd, check UA and refer urology

## 2021-11-18 NOTE — Assessment & Plan Note (Signed)
Also for  F/u psa,  to f/u any worsening symptoms or concerns

## 2021-11-21 ENCOUNTER — Telehealth: Payer: Self-pay | Admitting: Acute Care

## 2021-11-21 HISTORY — PX: COLONOSCOPY W/ POLYPECTOMY: SHX1380

## 2021-11-21 NOTE — Telephone Encounter (Signed)
Left message for pt to call back to schedule his follow up lung screening CT scan.  ?

## 2021-11-23 ENCOUNTER — Other Ambulatory Visit (HOSPITAL_COMMUNITY): Payer: Self-pay

## 2021-12-03 ENCOUNTER — Emergency Department
Admission: EM | Admit: 2021-12-03 | Discharge: 2021-12-04 | Disposition: A | Discharge status: Home / Self Care | Payer: Medicare Other | Attending: Emergency Medicine | Admitting: Emergency Medicine

## 2021-12-03 ENCOUNTER — Emergency Department: Payer: Medicare Other

## 2021-12-03 ENCOUNTER — Other Ambulatory Visit: Payer: Self-pay

## 2021-12-03 DIAGNOSIS — R29810 Facial weakness: Secondary | ICD-10-CM | POA: Diagnosis not present

## 2021-12-03 DIAGNOSIS — Z79899 Other long term (current) drug therapy: Secondary | ICD-10-CM | POA: Insufficient documentation

## 2021-12-03 DIAGNOSIS — R4781 Slurred speech: Secondary | ICD-10-CM | POA: Diagnosis not present

## 2021-12-03 DIAGNOSIS — R479 Unspecified speech disturbances: Secondary | ICD-10-CM | POA: Diagnosis not present

## 2021-12-03 DIAGNOSIS — Y900 Blood alcohol level of less than 20 mg/100 ml: Secondary | ICD-10-CM | POA: Insufficient documentation

## 2021-12-03 DIAGNOSIS — R0789 Other chest pain: Secondary | ICD-10-CM | POA: Insufficient documentation

## 2021-12-03 DIAGNOSIS — I6381 Other cerebral infarction due to occlusion or stenosis of small artery: Secondary | ICD-10-CM | POA: Diagnosis not present

## 2021-12-03 DIAGNOSIS — T17308A Unspecified foreign body in larynx causing other injury, initial encounter: Secondary | ICD-10-CM

## 2021-12-03 DIAGNOSIS — J449 Chronic obstructive pulmonary disease, unspecified: Secondary | ICD-10-CM | POA: Diagnosis not present

## 2021-12-03 DIAGNOSIS — R630 Anorexia: Secondary | ICD-10-CM | POA: Insufficient documentation

## 2021-12-03 DIAGNOSIS — R410 Disorientation, unspecified: Secondary | ICD-10-CM | POA: Diagnosis not present

## 2021-12-03 DIAGNOSIS — F12120 Cannabis abuse with intoxication, uncomplicated: Secondary | ICD-10-CM | POA: Insufficient documentation

## 2021-12-03 DIAGNOSIS — Z20822 Contact with and (suspected) exposure to covid-19: Secondary | ICD-10-CM | POA: Insufficient documentation

## 2021-12-03 DIAGNOSIS — F12929 Cannabis use, unspecified with intoxication, unspecified: Secondary | ICD-10-CM

## 2021-12-03 LAB — COMPREHENSIVE METABOLIC PANEL
ALT: 11 U/L (ref 0–44)
AST: 17 U/L (ref 15–41)
Albumin: 3.8 g/dL (ref 3.5–5.0)
Alkaline Phosphatase: 54 U/L (ref 38–126)
Anion gap: 10 (ref 5–15)
BUN: 23 mg/dL (ref 8–23)
CO2: 26 mmol/L (ref 22–32)
Calcium: 9 mg/dL (ref 8.9–10.3)
Chloride: 104 mmol/L (ref 98–111)
Creatinine, Ser: 1.36 mg/dL — ABNORMAL HIGH (ref 0.61–1.24)
GFR, Estimated: 58 mL/min — ABNORMAL LOW (ref >60)
Glucose, Bld: 150 mg/dL — ABNORMAL HIGH (ref 70–99)
Potassium: 3.9 mmol/L (ref 3.5–5.1)
Sodium: 140 mmol/L (ref 135–145)
Total Bilirubin: 0.7 mg/dL (ref 0.3–1.2)
Total Protein: 7.2 g/dL (ref 6.5–8.1)

## 2021-12-03 LAB — CBC
HCT: 44.8 % (ref 39.0–52.0)
Hemoglobin: 14.6 g/dL (ref 13.0–17.0)
MCH: 31.7 pg (ref 26.0–34.0)
MCHC: 32.6 g/dL (ref 30.0–36.0)
MCV: 97.4 fL (ref 80.0–100.0)
Platelets: 223 10*3/uL (ref 150–400)
RBC: 4.6 MIL/uL (ref 4.22–5.81)
RDW: 13.6 % (ref 11.5–15.5)
WBC: 13.3 10*3/uL — ABNORMAL HIGH (ref 4.0–10.5)
nRBC: 0 % (ref 0.0–0.2)

## 2021-12-03 LAB — PROTIME-INR
INR: 1 (ref 0.8–1.2)
Prothrombin Time: 13.6 seconds (ref 11.4–15.2)

## 2021-12-03 LAB — DIFFERENTIAL
Abs Immature Granulocytes: 0.05 10*3/uL (ref 0.00–0.07)
Basophils Absolute: 0 10*3/uL (ref 0.0–0.1)
Basophils Relative: 0 %
Eosinophils Absolute: 0 10*3/uL (ref 0.0–0.5)
Eosinophils Relative: 0 %
Immature Granulocytes: 0 %
Lymphocytes Relative: 11 %
Lymphs Abs: 1.5 10*3/uL (ref 0.7–4.0)
Monocytes Absolute: 0.6 10*3/uL (ref 0.1–1.0)
Monocytes Relative: 5 %
Neutro Abs: 11.1 10*3/uL — ABNORMAL HIGH (ref 1.7–7.7)
Neutrophils Relative %: 84 %

## 2021-12-03 LAB — RESP PANEL BY RT-PCR (FLU A&B, COVID) ARPGX2
Influenza A by PCR: NEGATIVE
Influenza B by PCR: NEGATIVE
SARS Coronavirus 2 by RT PCR: NEGATIVE

## 2021-12-03 LAB — CBG MONITORING, ED: Glucose-Capillary: 211 mg/dL — ABNORMAL HIGH (ref 70–99)

## 2021-12-03 LAB — APTT: aPTT: 24 seconds (ref 24–36)

## 2021-12-03 IMAGING — MR MR HEAD W/O CM
13 series · 48 of 48 positions shown · non-contrast
Comparison: [DATE], correlation is also made with CT head
[DATE]

CLINICAL DATA: Slurred speech, stroke suspected, right facial
asymmetry

EXAM:
MRI HEAD WITHOUT CONTRAST
TECHNIQUE: Multiplanar, multiecho pulse sequences of the brain and surrounding
structures were obtained without intravenous contrast.

[Series 5: ax dwi_tracew · axial · 3.0mm · 0.65mm/px · z∈[-72,+83]mm · 2 of 48 slices shown]
[im 1/48]
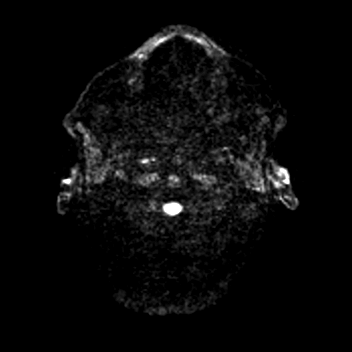
[im 48/48]
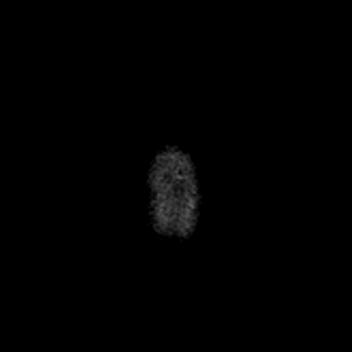

[Series 6: ax dwi_adc · axial · 3.0mm · 0.65mm/px · z∈[-72,+83]mm · 3 of 48 slices shown]
[im 1/48]
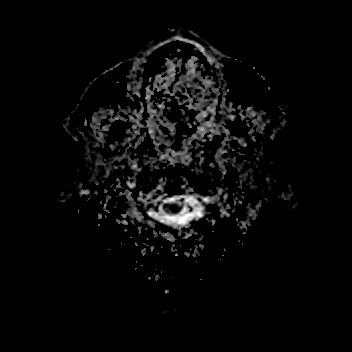
[im 24/48]
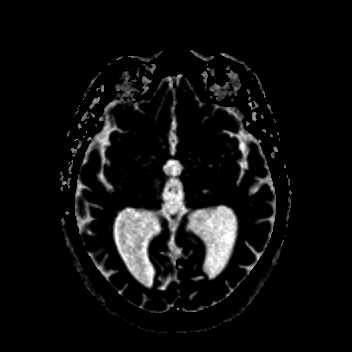
[im 48/48]
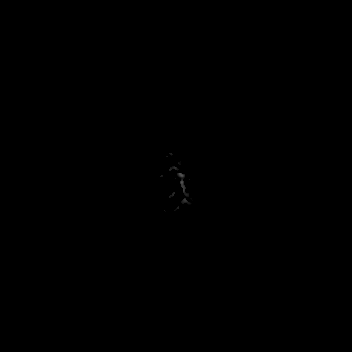

[Series 7: cor dwi_tracew · coronal · 5.0mm · 0.65mm/px · 3 of 40 slices shown]
[im 1/40]
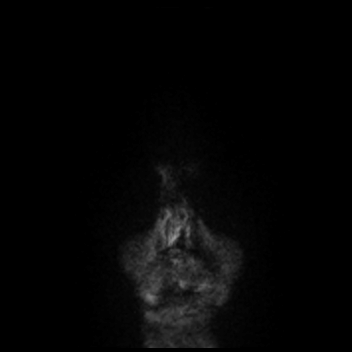
[im 20/40]
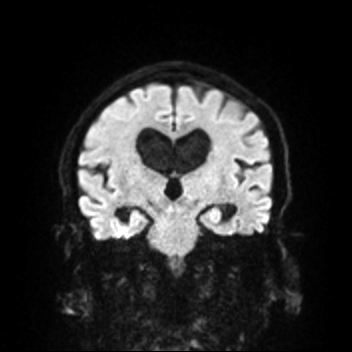
[im 40/40]
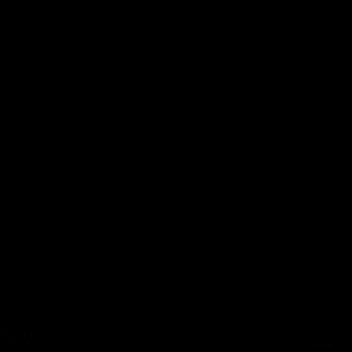

[Series 8: cor dwi_adc · coronal · 5.0mm · 0.65mm/px · 3 of 38 slices shown]
[im 1/38]
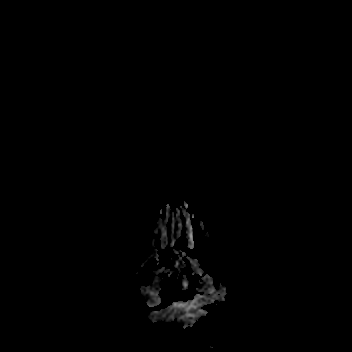
[im 19/38]
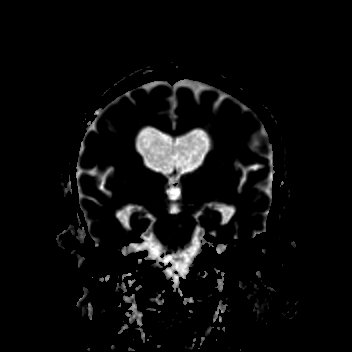
[im 38/38]
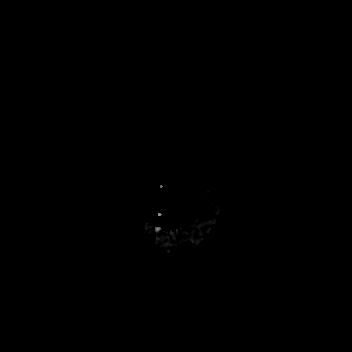

[Series 9: T1 · sagittal · 5.0mm · 0.62mm/px · 1 of 22 slices shown (1 of 2)]
[im 1/22]
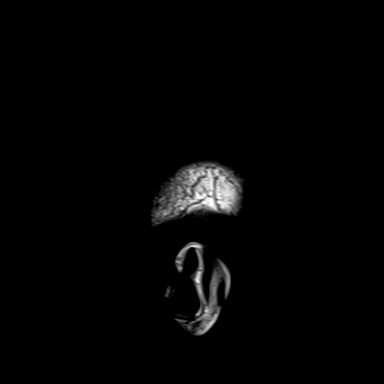

[Series 10: T2 · axial · 5.0mm · 0.53mm/px · z∈[-62,+100]mm · 2 of 28 slices shown (1 of 2)]
[im 1/28]
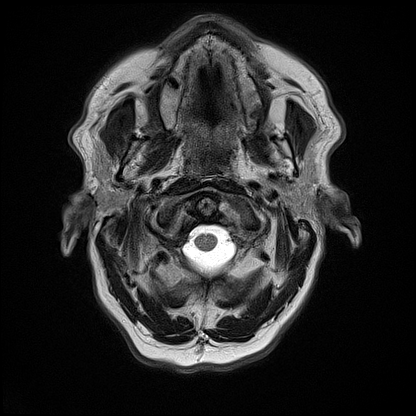
[im 28/28]
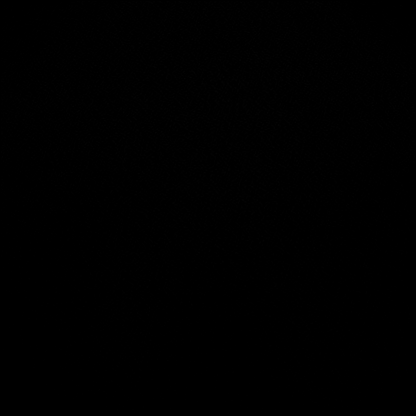

[Series 11: mag_images · axial · 3.0mm · 0.90mm/px · z∈[-69,+107]mm · 4 of 60 slices shown]
[im 1/60]
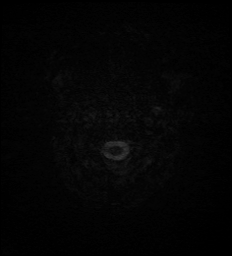
[im 20/60]
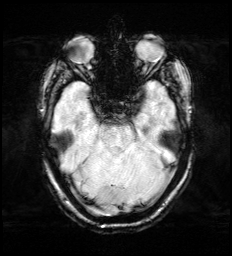
[im 40/60]
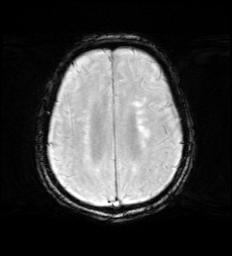
[im 60/60]
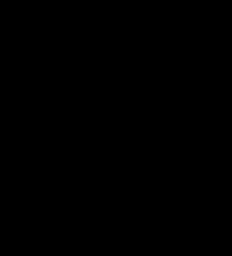

[Series 12: pha_images · axial · 3.0mm · 0.90mm/px · z∈[-69,+101]mm · 4 of 58 slices shown]
[im 1/58]
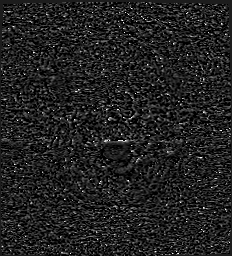
[im 20/58]
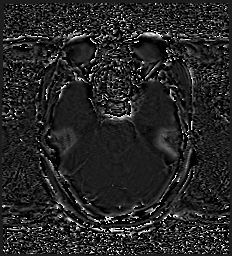
[im 39/58]
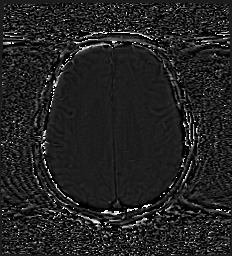
[im 58/58]
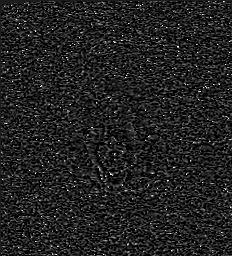

[Series 13: swi_images · axial · 3.0mm · 0.90mm/px · z∈[-69,+107]mm · 4 of 60 slices shown]
[im 1/60]
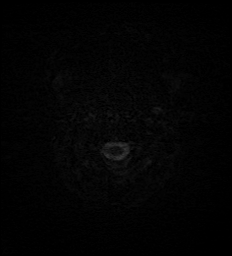
[im 20/60]
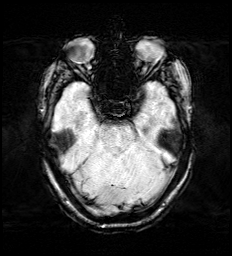
[im 40/60]
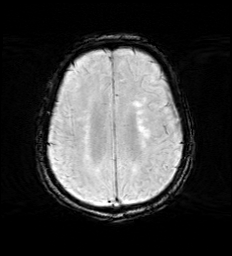
[im 60/60]
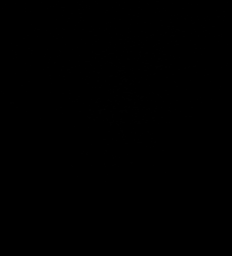

[Series 14: mip_images(sw) · axial · 24.0mm · 0.90mm/px · z∈[-59,+97]mm · 4 of 53 slices shown]
[im 1/53]
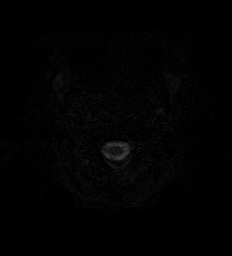
[im 18/53]
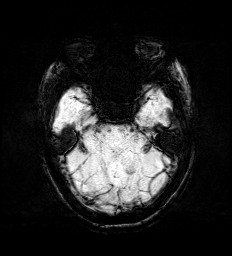
[im 35/53]
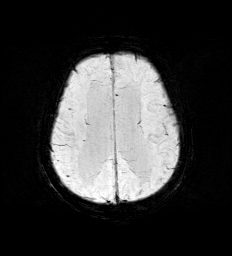
[im 53/53]
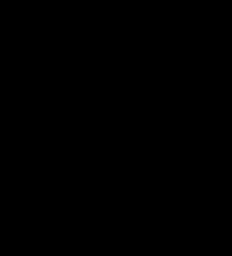

[Series 15: FLAIR · axial · 3.0mm · 0.53mm/px · z∈[-62,+100]mm · 4 of 55 slices shown]
[im 1/55]
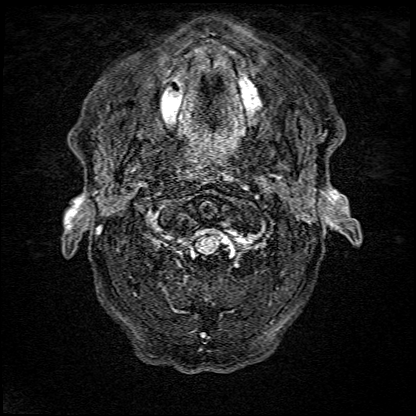
[im 19/55]
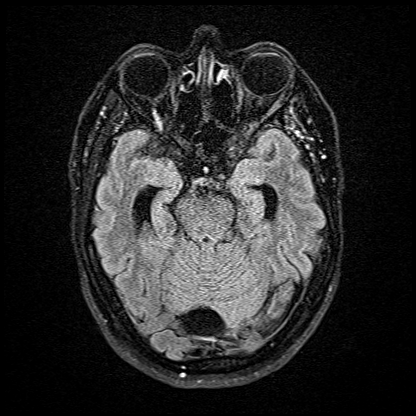
[im 37/55]
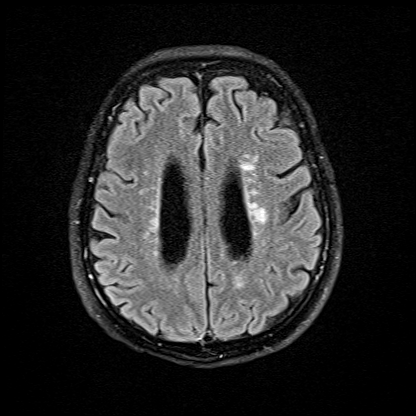
[im 55/55]
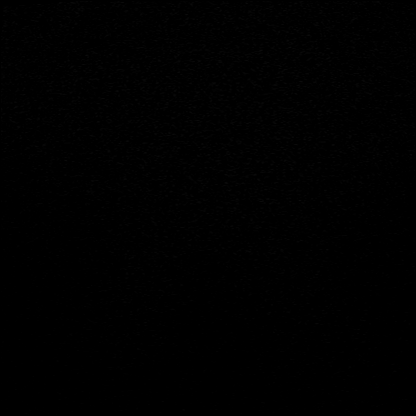

[Series 16: T1 · axial · 1.0mm · 0.98mm/px · z∈[-69,+105]mm · 12 of 174 slices shown (2 of 2)]
[im 1/174]
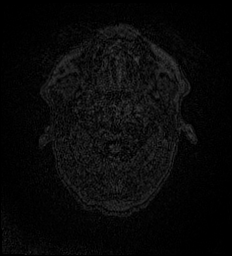
[im 16/174]
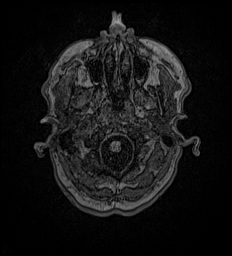
[im 32/174]
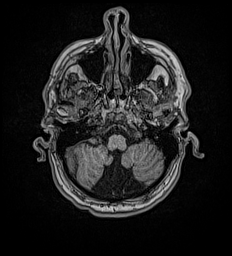
[im 48/174]
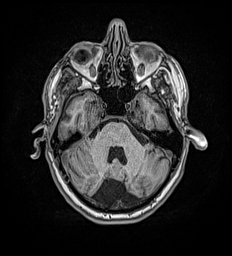
[im 63/174]
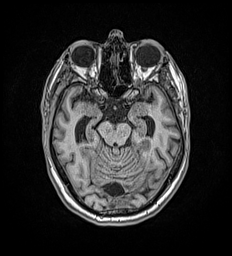
[im 79/174]
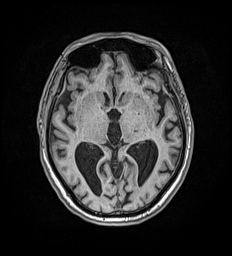
[im 95/174]
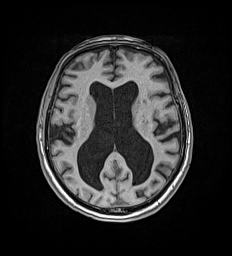
[im 111/174]
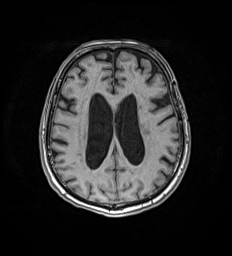
[im 126/174]
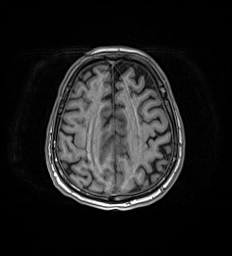
[im 142/174]
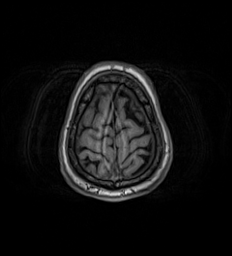
[im 158/174]
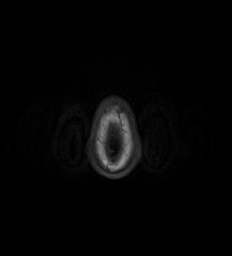
[im 174/174]
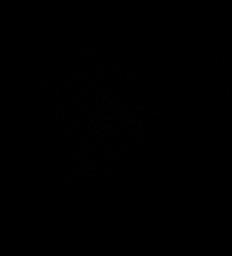

[Series 17: T2 · coronal · 5.0mm · 0.57mm/px · 2 of 29 slices shown (2 of 2)]
[im 1/29]
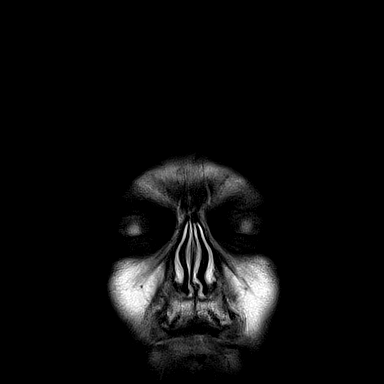
[im 29/29]
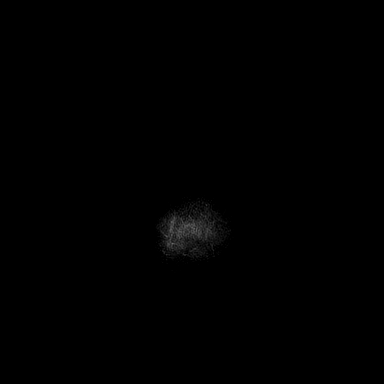

[48 of 48 positions shown; findings below may reference images not displayed]

FINDINGS: Brain: No restricted diffusion to suggest acute or subacute infarct.
No acute hemorrhage, mass, mass effect, or midline shift.
Redemonstrated ventriculomegaly, which is grossly unchanged from the
prior exam. Posterior fossa arachnoid cyst versus mega cisterna
magna. No acute extra-axial collection. T2 hyperintense signal in
the periventricular white matter, likely the sequela of chronic
small vessel ischemic disease. Redemonstrated left thalamic and
basal ganglia lacunar infarcts.

Vascular: Normal flow voids.

Skull and upper cervical spine: Normal marrow signal.

Sinuses/Orbits: No acute finding. Status post bilateral lens
replacements.

Other: Fluid in the right-greater-than-left mastoid air cells.
IMPRESSION: No acute intracranial process.  Unchanged ventriculomegaly.

## 2021-12-03 IMAGING — CT CT HEAD CODE STROKE
4 series · 16 of 47 positions shown, 18 images · non-contrast
Comparison: [DATE] CT head, [DATE] MRI

CLINICAL DATA: Code stroke. Right-sided facial droop, slurred
speech, confusion



[Series 3: head wo · axial · 0.42mm/px · z∈[-146,-26]mm · 7 of 34 slices shown, 9 images]
[im 5/34  brain]
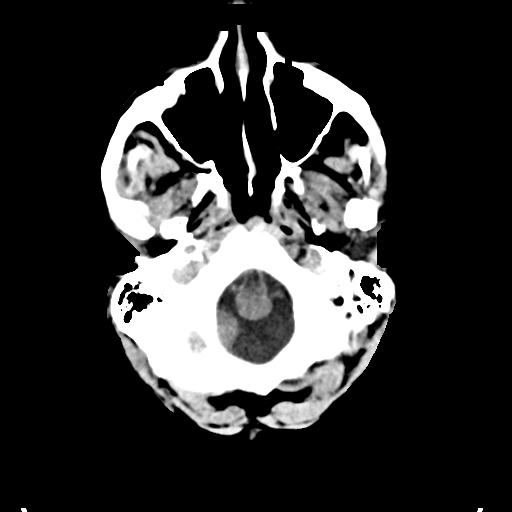
[im 5/34  bone]
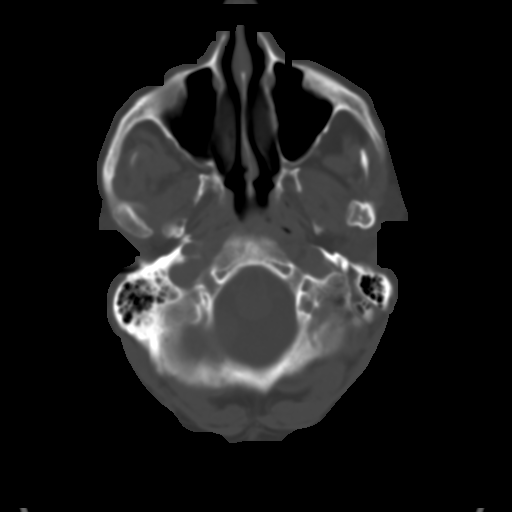
[im 9/34  brain]
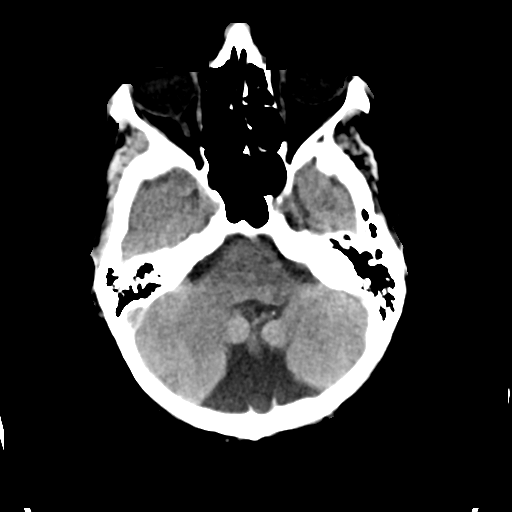
[im 13/34  brain]
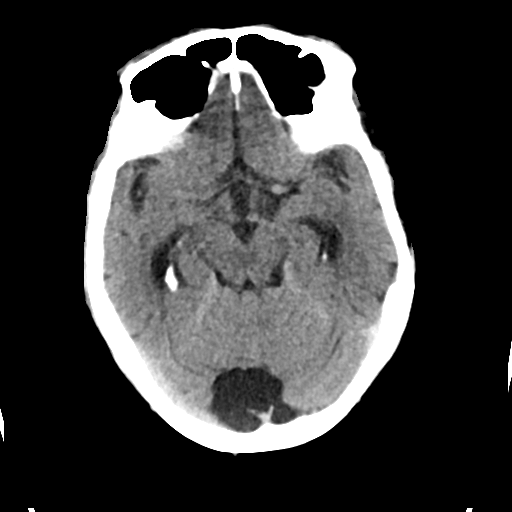
[im 17/34  brain]
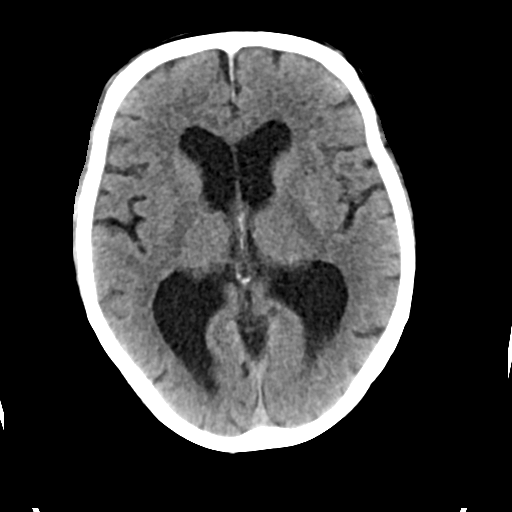
[im 21/34  brain]
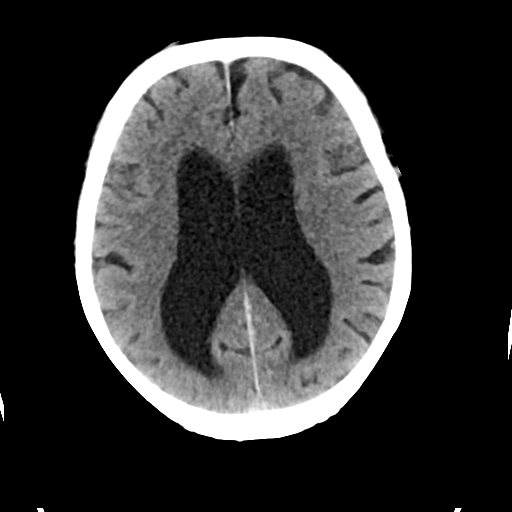
[im 21/34  bone]
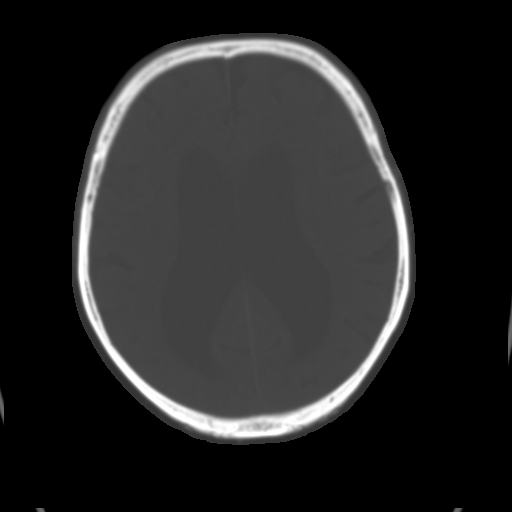
[im 25/34  brain]
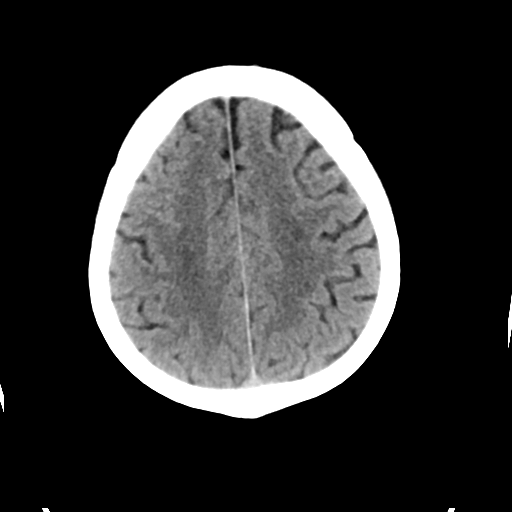
[im 29/34  brain]
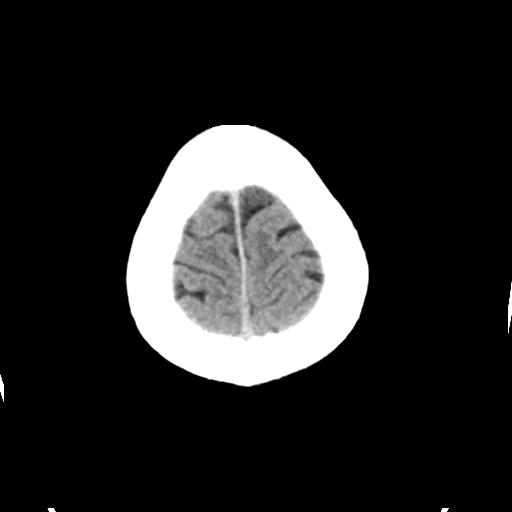

[Series 4: head bone · axial · 0.42mm/px · z∈[-150,-118]mm · 3 of 84 slices shown]
[im 9/84  bone]
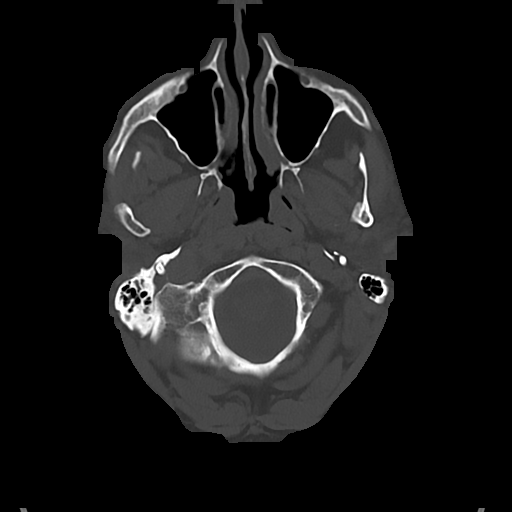
[im 17/84  bone]
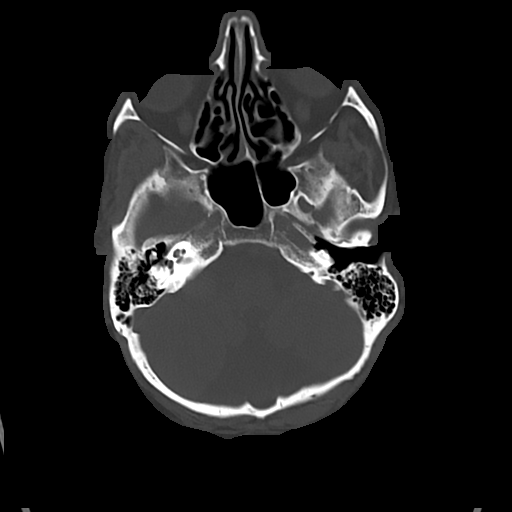
[im 25/84  bone]
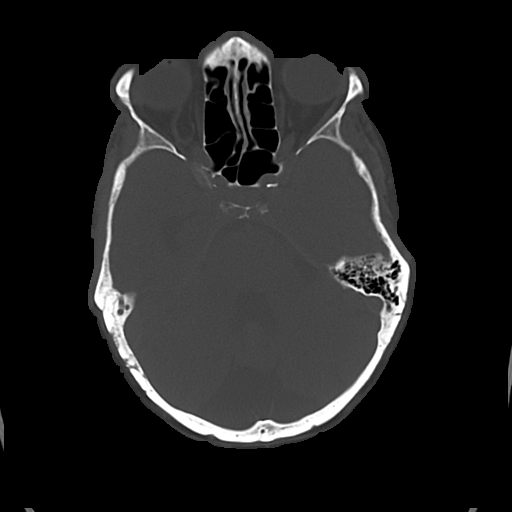

[Series 5: cor soft · coronal · 0.30mm/px · 3 of 62 slices shown]
[im 21/62  brain]
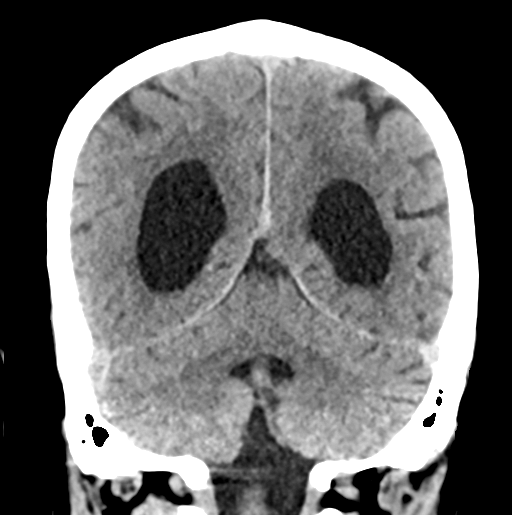
[im 28/62  brain]
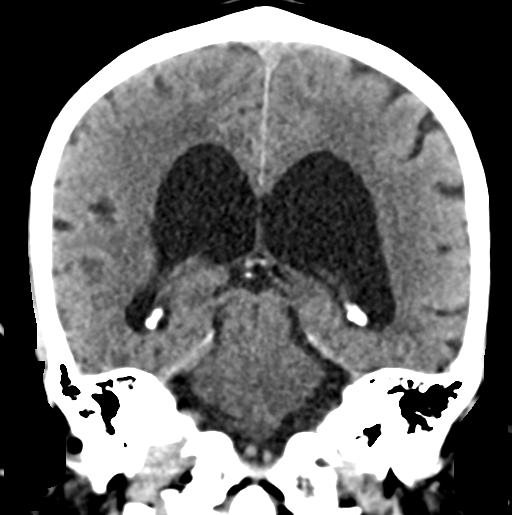
[im 34/62  brain]
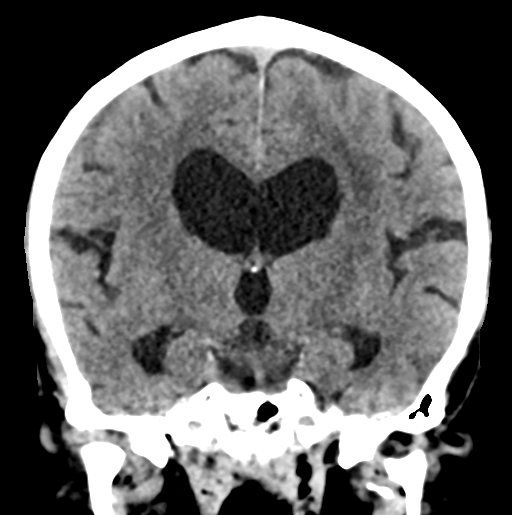

[Series 6: sag soft · sagittal · 0.31mm/px · 3 of 53 slices shown]
[im 18/53  brain]
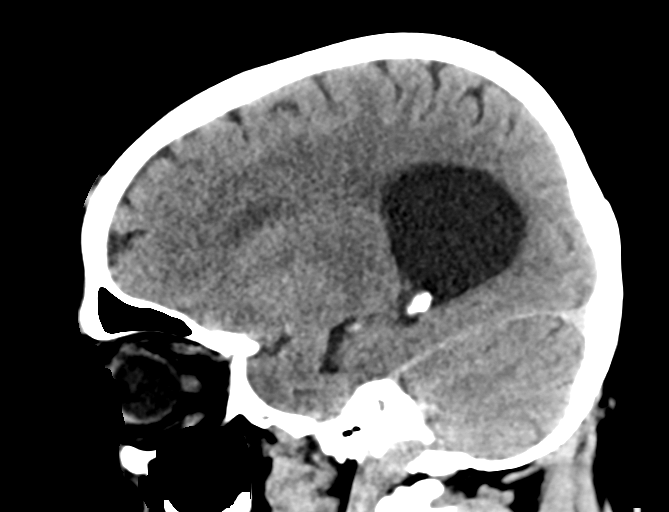
[im 27/53  brain]
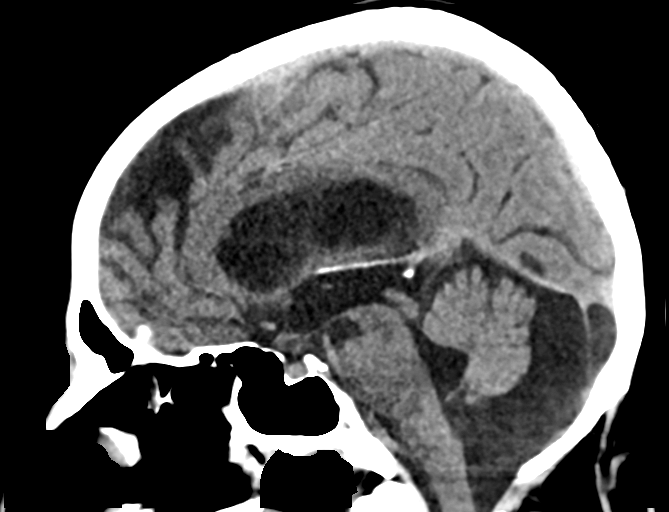
[im 35/53  brain]
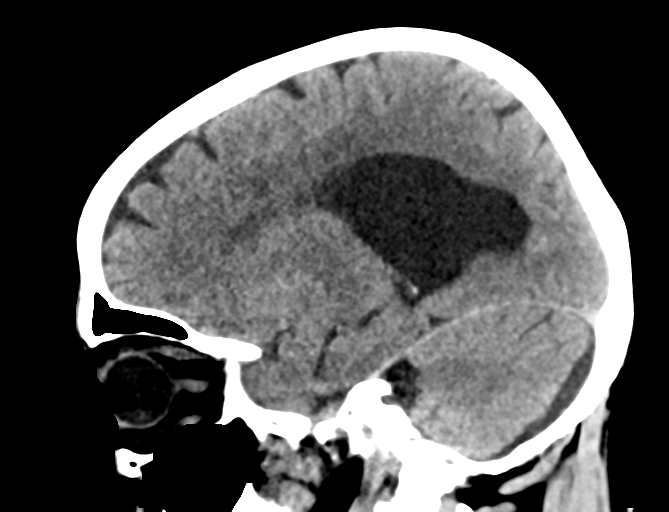

[16 of 47 positions shown; findings below may reference images not displayed]

FINDINGS: Brain: Stable dilatation of the ventricles, similar to the prior CT.
No acute infarct, hemorrhage, mass, mass effect, or midline shift.
Periventricular white matter changes, likely the sequela of chronic
small vessel ischemic disease.

Vascular: No hyperdense vessel.

Skull: Normal. Negative for fracture or focal lesion.

Sinuses/Orbits: No acute finding.

Other: The mastoids are well aerated.

ASPECTS ([place] Stroke Program Early CT Score)

- Ganglionic level infarction (caudate, lentiform nuclei, internal
capsule, insula, M1-M3 cortex): 7

- Supraganglionic infarction (M4-M6 cortex): 3

Total score (0-10 with 10 being normal): 10
IMPRESSION: 1. No acute intracranial process.
2. ASPECTS is 10

These results were called by telephone at the time of interpretation
acknowledged these results.

## 2021-12-03 MED ORDER — SODIUM CHLORIDE 0.9 % IV BOLUS (SEPSIS)
1000.0000 mL | Freq: Once | INTRAVENOUS | Status: AC
  Administered 2021-12-04: 1000 mL via INTRAVENOUS

## 2021-12-03 MED ORDER — SODIUM CHLORIDE 0.9% FLUSH
3.0000 mL | Freq: Once | INTRAVENOUS | Status: AC
  Administered 2021-12-03: 3 mL via INTRAVENOUS

## 2021-12-03 NOTE — ED Notes (Signed)
CODE STROKE CALLED TO Taylor Ochoa [name]

## 2021-12-03 NOTE — ED Notes (Addendum)
Pt to ED via POV with his wife. Pt's wife states pt got choked up on a cracker and became diaphoretic and was able to clear it. Pt's wife states since then pt has been off balance, increased difficulty speaking and slurred speech, R sided facial droop noted in triage that wife ? reports is not baseline. Pt with facial symmetry noted with smiling, however when not smiling R sided facial droop, equal and strong grip strength noted. Pt also noted with balance difficulty upon arrival to triage desk and initially placed in wheelchair upon arrival due to stumbling gait. Pt A&O x4, however some difficulty speaking noted during triage.  ?

## 2021-12-03 NOTE — ED Notes (Signed)
Neuro consult compete.  ?

## 2021-12-03 NOTE — ED Notes (Signed)
Patient transported to MRI 
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
## 2021-12-03 NOTE — Progress Notes (Signed)
Code stroke activated at 2229. Dr Maryan Rued on screen at 2239 to evaluate pt ?

## 2021-12-04 DIAGNOSIS — I6381 Other cerebral infarction due to occlusion or stenosis of small artery: Secondary | ICD-10-CM | POA: Diagnosis not present

## 2021-12-04 LAB — ETHANOL: Alcohol, Ethyl (B): <10 mg/dL (ref <10)

## 2021-12-04 LAB — URINE DRUG SCREEN, QUALITATIVE (ARMC ONLY)
Amphetamines, Ur Screen: NOT DETECTED
Barbiturates, Ur Screen: NOT DETECTED
Benzodiazepine, Ur Scrn: NOT DETECTED
Cannabinoid 50 Ng, Ur ~~LOC~~: POSITIVE — AB
Cocaine Metabolite,Ur ~~LOC~~: NOT DETECTED
MDMA (Ecstasy)Ur Screen: NOT DETECTED
Methadone Scn, Ur: NOT DETECTED
Opiate, Ur Screen: NOT DETECTED
Phencyclidine (PCP) Ur S: NOT DETECTED
Tricyclic, Ur Screen: NOT DETECTED

## 2021-12-04 LAB — URINALYSIS, ROUTINE W REFLEX MICROSCOPIC
Bilirubin Urine: NEGATIVE
Glucose, UA: 50 mg/dL — AB
Hgb urine dipstick: NEGATIVE
Ketones, ur: NEGATIVE mg/dL
Leukocytes,Ua: NEGATIVE
Nitrite: NEGATIVE
Protein, ur: NEGATIVE mg/dL
Specific Gravity, Urine: 1.01 (ref 1.005–1.030)
pH: 5 (ref 5.0–8.0)

## 2021-12-04 NOTE — Progress Notes (Signed)
Niota entered room where patient was alert and oriented. Was eating a sandwhich with head of bed elevated several inches. SO was also present in room. Spiritual care was provided through building rapport. Visit was appreciated.  ?

## 2021-12-19 ENCOUNTER — Other Ambulatory Visit (HOSPITAL_COMMUNITY): Payer: Self-pay

## 2021-12-19 ENCOUNTER — Other Ambulatory Visit: Payer: Self-pay | Admitting: Internal Medicine

## 2021-12-19 DIAGNOSIS — F411 Generalized anxiety disorder: Secondary | ICD-10-CM

## 2021-12-19 MED ORDER — SERTRALINE HCL 100 MG PO TABS
100.0000 mg | ORAL_TABLET | Freq: Every day | ORAL | 1 refills | Status: DC
  Filled 2021-12-19: qty 90, 90d supply, fill #0
  Filled 2022-04-09: qty 90, 90d supply, fill #1

## 2022-01-18 ENCOUNTER — Other Ambulatory Visit (HOSPITAL_COMMUNITY): Payer: Self-pay

## 2022-01-23 ENCOUNTER — Encounter: Payer: Self-pay | Admitting: Internal Medicine

## 2022-01-23 ENCOUNTER — Ambulatory Visit (INDEPENDENT_AMBULATORY_CARE_PROVIDER_SITE_OTHER): Payer: Medicare Other | Admitting: Internal Medicine

## 2022-01-23 VITALS — BP 128/76 | HR 74 | Temp 97.8°F | Ht 67.0 in | Wt 151.0 lb

## 2022-01-23 DIAGNOSIS — Z23 Encounter for immunization: Secondary | ICD-10-CM | POA: Insufficient documentation

## 2022-01-23 DIAGNOSIS — J411 Mucopurulent chronic bronchitis: Secondary | ICD-10-CM

## 2022-01-23 DIAGNOSIS — L602 Onychogryphosis: Secondary | ICD-10-CM

## 2022-01-23 DIAGNOSIS — Z1211 Encounter for screening for malignant neoplasm of colon: Secondary | ICD-10-CM | POA: Insufficient documentation

## 2022-01-23 MED ORDER — SHINGRIX 50 MCG/0.5ML IM SUSR: 0.5000 mL | Freq: Once | INTRAMUSCULAR | 1 refills | Status: AC

## 2022-01-23 MED ORDER — SPIRIVA RESPIMAT 1.25 MCG/ACT IN AERS: 2.0000 | INHALATION_SPRAY | Freq: Every day | RESPIRATORY_TRACT | 5 refills | Status: DC

## 2022-01-23 NOTE — Progress Notes (Signed)
? ?Subjective:  ?Patient ID: Taylor Ochoa, male    DOB: 1955-10-27  Age: 66 y.o. MRN: [id number] ? ?CC: COPD ? ? ?HPI ?Taylor Ochoa presents for f/up - ? ?He is concerned about his feet. His SOB is at baseline. ? ?Outpatient Medications Prior to Visit  ?Medication Sig Dispense Refill  ? acetaminophen (TYLENOL) 500 MG tablet Take 500 mg by mouth every 6 (six) hours as needed.    ? gabapentin (NEURONTIN) 600 MG tablet Take 1 tablet (600 mg total) by mouth 3 (three) times daily. 270 tablet 1  ? pantoprazole (PROTONIX) 40 MG tablet Take 1 tablet (40 mg total) by mouth daily. 90 tablet 1  ? rosuvastatin (CRESTOR) 20 MG tablet Take 1 tablet (20 mg total) by mouth daily. 90 tablet 1  ? sertraline (ZOLOFT) 100 MG tablet Take 1 tablet (100 mg total) by mouth daily. 90 tablet 1  ? tamsulosin (FLOMAX) 0.4 MG CAPS capsule Take 1 capsule (0.4 mg total) by mouth daily. 90 capsule 3  ? Tiotropium Bromide Monohydrate (SPIRIVA RESPIMAT) 1.25 MCG/ACT AERS INHALE 2 PUFFS INTO THE LUNGS DAILY. 4 g 5  ? ?No facility-administered medications prior to visit.  ? ? ?ROS ?Review of Systems  ?Constitutional:  Negative for appetite change, chills, diaphoresis, fatigue and fever.  ?HENT: Negative.    ?Respiratory:  Positive for shortness of breath. Negative for cough, chest tightness and wheezing.   ?Cardiovascular:  Negative for chest pain, palpitations and leg swelling.  ?Gastrointestinal:  Negative for abdominal pain, diarrhea, nausea and vomiting.  ?Musculoskeletal: Negative.   ?Skin: Negative.   ?Neurological:  Negative for dizziness, weakness and light-headedness.  ?Hematological:  Negative for adenopathy. Does not bruise/bleed easily.  ?Psychiatric/Behavioral: Negative.    ? ?Objective:  ?BP 128/76 (BP Location: Left Arm, Patient Position: Sitting, Cuff Size: Large)   Pulse 74   Temp 97.8 ?F (36.6 ?C) (Oral)   Ht '5\' 7"'$  (1.702 m)   Wt 151 lb (68.5 kg)   SpO2 95%   BMI 23.65 kg/m?  ? ?BP Readings from Last 3 Encounters:  ?01/23/22  128/76  ?12/04/21 120/72  ?11/14/21 116/68  ? ? ?Wt Readings from Last 3 Encounters:  ?01/23/22 151 lb (68.5 kg)  ?12/03/21 154 lb 15.7 oz (70.3 kg)  ?11/14/21 154 lb (69.9 kg)  ? ? ?Physical Exam ?Vitals reviewed.  ?HENT:  ?   Mouth/Throat:  ?   Mouth: Mucous membranes are moist.  ?Eyes:  ?   General: No scleral icterus. ?   Conjunctiva/sclera: Conjunctivae normal.  ?Cardiovascular:  ?   Rate and Rhythm: Normal rate.  ?   Pulses:     ?     Dorsalis pedis pulses are 0 on the right side and 0 on the left side.  ?     Posterior tibial pulses are 1+ on the right side and 1+ on the left side.  ?   Heart sounds: No murmur heard. ?  No gallop.  ?Pulmonary:  ?   Effort: Pulmonary effort is normal.  ?   Breath sounds: Examination of the right-upper field reveals decreased breath sounds. Examination of the left-upper field reveals decreased breath sounds. Examination of the right-middle field reveals decreased breath sounds. Examination of the left-middle field reveals decreased breath sounds. Examination of the right-lower field reveals decreased breath sounds. Examination of the left-lower field reveals decreased breath sounds. Decreased breath sounds present. No wheezing, rhonchi or rales.  ?Abdominal:  ?   General: Abdomen is flat.  ?  Palpations: There is no mass.  ?   Tenderness: There is no abdominal tenderness. There is no guarding.  ?   Hernia: No hernia is present.  ?Musculoskeletal:     ?   General: Normal range of motion.  ?   Cervical back: Neck supple.  ?   Right lower leg: No edema.  ?   Left lower leg: No edema.  ?Feet:  ?   Right foot:  ?   Skin integrity: Callus present. No ulcer, blister, skin breakdown or erythema.  ?   Toenail Condition: Right toenails are abnormally thick and long.  ?   Left foot:  ?   Skin integrity: Callus present. No ulcer, blister, skin breakdown or erythema.  ?   Toenail Condition: Left toenails are abnormally thick and long.  ?Lymphadenopathy:  ?   Cervical: No cervical  adenopathy.  ?Skin: ?   General: Skin is warm and dry.  ?   Findings: No rash.  ?Neurological:  ?   General: No focal deficit present.  ?   Mental Status: Mental status is at baseline.  ? ? ?Lab Results  ?Component Value Date  ? WBC 13.3 (H) 12/03/2021  ? HGB 14.6 12/03/2021  ? HCT 44.8 12/03/2021  ? PLT 223 12/03/2021  ? GLUCOSE 150 (H) 12/03/2021  ? CHOL 174 07/25/2021  ? TRIG 160.0 (H) 07/25/2021  ? HDL 51.40 07/25/2021  ? [place] 91 07/25/2021  ? ALT 11 12/03/2021  ? AST 17 12/03/2021  ? NA 140 12/03/2021  ? K 3.9 12/03/2021  ? CL 104 12/03/2021  ? CREATININE 1.36 (H) 12/03/2021  ? BUN 23 12/03/2021  ? CO2 26 12/03/2021  ? TSH 0.65 07/25/2021  ? PSA 2.65 11/14/2021  ? INR 1.0 12/03/2021  ? HGBA1C 6.0 07/25/2021  ? ? ?MR BRAIN WO CONTRAST ? ?Result Date: 12/04/2021 ?CLINICAL DATA:  Slurred speech, stroke suspected, right facial asymmetry EXAM: MRI HEAD WITHOUT CONTRAST TECHNIQUE: Multiplanar, multiecho pulse sequences of the brain and surrounding structures were obtained without intravenous contrast. COMPARISON:  02/29/2020, correlation is also made with CT head 12/03/2021 FINDINGS: Brain: No restricted diffusion to suggest acute or subacute infarct. No acute hemorrhage, mass, mass effect, or midline shift. Redemonstrated ventriculomegaly, which is grossly unchanged from the prior exam. Posterior fossa arachnoid cyst versus mega cisterna magna. No acute extra-axial collection. T2 hyperintense signal in the periventricular white matter, likely the sequela of chronic small vessel ischemic disease. Redemonstrated left thalamic and basal ganglia lacunar infarcts. Vascular: Normal flow voids. Skull and upper cervical spine: Normal marrow signal. Sinuses/Orbits: No acute finding. Status post bilateral lens replacements. Other: Fluid in the right-greater-than-left mastoid air cells. IMPRESSION: No acute intracranial process.  Unchanged ventriculomegaly. Electronically Signed   By: Merilyn Baba M.D.   On: 12/04/2021  00:31  ? ?CT HEAD CODE STROKE WO CONTRAST ? ?Result Date: 12/03/2021 ?CLINICAL DATA:  Code stroke. Right-sided facial droop, slurred speech, confusion EXAM: CT HEAD WITHOUT CONTRAST TECHNIQUE: Contiguous axial images were obtained from the base of the skull through the vertex without intravenous contrast. RADIATION DOSE REDUCTION: This exam was performed according to the departmental dose-optimization program which includes automated exposure control, adjustment of the mA and/or kV according to patient size and/or use of iterative reconstruction technique. COMPARISON:  12/14/2017 CT head, 02/29/2020 MRI FINDINGS: Brain: Stable dilatation of the ventricles, similar to the prior CT. No acute infarct, hemorrhage, mass, mass effect, or midline shift. Periventricular white matter changes, likely the sequela of chronic small vessel ischemic  disease. Vascular: No hyperdense vessel. Skull: Normal. Negative for fracture or focal lesion. Sinuses/Orbits: No acute finding. Other: The mastoids are well aerated. ASPECTS [facility] Stroke Program Early CT Score) - Ganglionic level infarction (caudate, lentiform nuclei, internal capsule, insula, M1-M3 cortex): 7 - Supraganglionic infarction (M4-M6 cortex): 3 Total score (0-10 with 10 being normal): 10 IMPRESSION: 1. No acute intracranial process. 2. ASPECTS is 10 These results were called by telephone at the time of interpretation on 12/03/2021 at 10:54 pm to provider DR. [facility] , who verbally acknowledged these results. Electronically Signed   By: Merilyn Baba M.D.   On: 12/03/2021 22:55  ? ? ?Assessment & Plan:  ? ?Taylor Ochoa was seen today for copd. ? ?Diagnoses and all orders for this visit: ? ?Long toenail ?-     Ambulatory referral to Podiatry ? ?Need for prophylactic vaccination with combined vaccine ?-     Zoster Vaccine Adjuvanted [facility]) injection; Inject 0.5 mLs into the muscle once for 1 dose. ? ?Screen for colon cancer ?-     Ambulatory referral to  Gastroenterology ? ?Mucopurulent chronic bronchitis ([place])- He is doing well on the LAMA. ?-     Tiotropium Bromide Monohydrate (SPIRIVA RESPIMAT) 1.25 MCG/ACT AERS; INHALE 2 PUFFS INTO THE LUNGS DAILY. ? ? ?I am having Taylor Ochoa start on

## 2022-01-23 NOTE — Patient Instructions (Signed)
Chronic Obstructive Pulmonary Disease ? ?Chronic obstructive pulmonary disease (COPD) is a long-term (chronic) condition that affects the lungs. COPD is a general term that can be used to describe many different lung problems that cause lung inflammation and limit airflow, including chronic bronchitis and emphysema. ?If you have COPD, your lung function will probably never return to normal. In most cases, it gets worse over time. However, there are steps you can take to slow the progression of the disease and improve your quality of life. ?What are the causes? ?This condition may be caused by: ?Smoking. This is the most common cause. ?Certain genes passed down through families. ?What increases the risk? ?The following factors may make you more likely to develop this condition: ?Being exposed to secondhand smoke from cigarettes, pipes, or cigars. ?Being exposed to chemicals and other irritants, such as fumes and dust in the work environment. ?Having chronic lung conditions or infections. ?What are the signs or symptoms? ?Symptoms of this condition include: ?Shortness of breath, especially during physical activity. ?Chronic cough with a large amount of thick mucus. Sometimes, the cough may not have any mucus (dry cough). ?Wheezing and rapid breathing. ?[place] Cables or bluish discoloration (cyanosis) of the skin, especially in the fingers, toes, or lips. ?Feeling tired (fatigue). ?Weight loss. ?Chest tightness. ?Frequent infections. ?Episodes when breathing symptoms become much worse (exacerbations). ?At the later stages of this disease, you may have swelling in the ankles, feet, or legs. ?How is this diagnosed? ?This condition is diagnosed based on: ?Your medical history. ?A physical exam. ?You may also have tests, including: ?Lung (pulmonary) function tests. This may include a spirometry test, which measures your ability to exhale properly. ?Chest X-ray. ?CT scan. ?Blood tests. ?How is this treated? ?This condition may be  treated with: ?Medicines. These may include inhaled rescue medicines to treat acute exacerbations as well as medicines that you take long-term (maintenance medicines) to prevent flare-ups of COPD. ?Bronchodilators help treat COPD by dilating the airways to allow increased airflow and make your breathing more comfortable. ?Steroids can reduce airway inflammation and help prevent exacerbations. ?Smoking cessation. If you smoke, your health care provider may ask you to quit, and may also recommend therapy or replacement products to help you quit. ?Pulmonary rehabilitation. This may involve working with a team of health care providers and specialists, such as respiratory, occupational, and physical therapists. ?Exercise and physical activity. These are beneficial for nearly all people with COPD. ?Nutrition therapy to gain weight, if you are underweight. ?Oxygen. Supplemental oxygen therapy is only helpful if you have a low oxygen level in your blood (hypoxemia). ?Lung surgery or transplant. ?Palliative care. This is to help people with COPD feel comfortable when treatment is no longer working. ?Follow these instructions at home: ?Medicines ?Take over-the-counter and prescription medicines only as told by your health care provider. This includes inhaled medicines and pills. ?Talk to your health care provider before taking any cough or allergy medicines. You may need to avoid certain medicines that dry out your airways. ?Lifestyle ?If you smoke, the most important thing that you can do is to stop smoking. Continuing to smoke will cause the disease to progress faster. ?Do not use any products that contain nicotine or tobacco. These products include cigarettes, chewing tobacco, and vaping devices, such as e-cigarettes. If you need help quitting, ask your health care provider. ?Avoid exposure to things that irritate your lungs, such as smoke, chemicals, and fumes. ?Stay active, but balance activity with periods of rest.  Exercise and  physical activity will help you maintain your ability to do things you want to do. ?Learn and use relaxation techniques to manage stress and to control your breathing. ?Get the right amount of sleep and get quality sleep. Most adults need 7 or more hours per night. ?Eat healthy foods. Eating smaller, more frequent meals and resting before meals may help you maintain your strength. ?Controlled breathing ?Learn and use controlled breathing techniques as directed by your health care provider. Controlled breathing techniques include: ?Pursed lip breathing. Start by breathing in (inhaling) through your nose for 1 second. Then, purse your lips as if you were going to whistle and breathe out (exhale) through the pursed lips for 2 seconds. ?Diaphragmatic breathing. Start by putting one hand on your abdomen just above your waist. Inhale slowly through your nose. The hand on your abdomen should move out. Then purse your lips and exhale slowly. You should be able to feel the hand on your abdomen moving in as you exhale. ? ?Controlled coughing ?Learn and use controlled coughing to clear mucus from your lungs. Controlled coughing is a series of short, progressive coughs. The steps of controlled coughing are: ?Lean your head slightly forward. ?Breathe in deeply using diaphragmatic breathing. ?Try to hold your breath for 3 seconds. ?Keep your mouth slightly open while coughing twice. ?Spit any mucus out into a tissue. ?Rest and repeat the steps once or twice as needed. ?General instructions ?Make sure you receive all the vaccines that your health care provider recommends, especially the pneumococcal and influenza vaccines. Preventing infection and hospitalization is very important when you have COPD. ?Drink enough fluid to keep your urine pale yellow, unless you have a medical condition that requires fluid restriction. ?Use oxygen therapy and pulmonary rehabilitation if told by your health care provider. If you  require home oxygen therapy, ask your health care provider whether you should purchase a pulse oximeter to measure your oxygen level at home. ?Work with your health care provider to develop a COPD action plan. This will help you know what steps to take if your condition gets worse. ?Keep other chronic health conditions under control as told by your health care provider. ?Avoid extreme temperature and humidity changes. ?Avoid contact with people who have an illness that spreads from person to person (is contagious), such as viral infections or pneumonia. ?Keep all follow-up visits. This is important. ?Contact a health care provider if: ?You are coughing up more mucus than usual. ?There is a change in the color or thickness of your mucus. ?Your breathing is more labored than usual. ?Your breathing is faster than usual. ?You have difficulty sleeping. ?You need to use your rescue medicines or inhalers more often than expected. ?You have trouble doing routine activities such as getting dressed or walking around the house. ?Get help right away if: ?You have shortness of breath while you are resting. ?You have shortness of breath that prevents you from: ?Being able to talk. ?Performing your usual physical activities. ?You have chest pain lasting longer than 5 minutes. ?Your skin color is more blue (cyanotic) than usual. ?You measure low oxygen saturations for longer than 5 minutes with a pulse oximeter. ?You have a fever. ?You feel too tired to breathe normally. ?These symptoms may represent a serious problem that is an emergency. Do not wait to see if the symptoms will go away. Get medical help right away. Call your local emergency services (911 in the [place] Do not drive yourself to the hospital. ?Summary ?Chronic obstructive  pulmonary disease (COPD) is a long-term (chronic) condition that affects the lungs. ?Your lung function will probably never return to normal. In most cases, it gets worse over time. However, there  are steps you can take to slow the progression of the disease and improve your quality of life. ?Treatment for COPD may include taking medicines, quitting smoking, pulmonary rehabilitation, and changes to diet and

## 2022-01-30 ENCOUNTER — Encounter: Payer: Self-pay | Admitting: Podiatry

## 2022-01-30 ENCOUNTER — Ambulatory Visit: Payer: Medicare Other | Admitting: Podiatry

## 2022-01-30 DIAGNOSIS — B070 Plantar wart: Secondary | ICD-10-CM | POA: Diagnosis not present

## 2022-01-30 DIAGNOSIS — M79675 Pain in left toe(s): Secondary | ICD-10-CM | POA: Diagnosis not present

## 2022-01-30 DIAGNOSIS — B351 Tinea unguium: Secondary | ICD-10-CM

## 2022-01-30 DIAGNOSIS — M79674 Pain in right toe(s): Secondary | ICD-10-CM

## 2022-01-30 NOTE — Progress Notes (Signed)
?  Subjective:  ?Patient ID: Taylor Ochoa, male    DOB: 1956/09/23,  MRN: [id number] ? ?Chief Complaint  ?Patient presents with  ? Nail Problem  ?  Plantar wart/ Long toenail Referring Provider: Janith Lima  ? ? ?66 y.o. male presents with the above complaint. History confirmed with patient.  His nails are significantly thickened elongated and causing quite a bit of pain.  He is unable to cut them.  He has multiple lesions on both feet that are quite painful and thinks that may be warts. ? ?Objective:  ?Physical Exam: ?warm, good capillary refill, no trophic changes or ulcerative lesions, normal DP and PT pulses, and normal sensory exam. ?Left Foot: dystrophic yellowed discolored nail plates with subungual debris and verruca appearing lesions submetatarsal 1 5 and plantar heel ?Right Foot: dystrophic yellowed discolored nail plates with subungual debris and verruca appearing lesions lateral plantar midfoot ? ?Assessment:  ? ?1. Pain due to onychomycosis of toenails of both feet   ?2. Verruca plantaris   ? ? ? ?Plan:  ?Patient was evaluated and treated and all questions answered. ? ?Discussed the etiology and treatment options for the condition in detail with the patient. Educated patient on the topical and oral treatment options for mycotic nails. Recommended debridement of the nails today. Sharp and mechanical debridement performed of all painful and mycotic nails today. Nails debrided in length and thickness using a nail nipper to level of comfort. Discussed treatment options including appropriate shoe gear. Follow up as needed for painful nails. ? ? ?Discussed etiology and treatment of verruca plantaris in detail with the patient as well as multiple treatment options including blistering agents.  Today, recommended treatment with salinocaine as noted in procedure note below.  Recommend he treat with OTC corn and wart remover at home ? ?Procedure: Destruction of Lesion ?Location: Left foot fifth toe  submetatarsal 1 plantar heel, right midfoot ?Instrumentation: 15 blade. ?Technique: Debridement of lesion to petechial bleeding. Aperture pad applied around lesion. Small amount of salinocaine applied to the base of the lesion. ?Dressing: Dry, sterile, compression dressing. ?Disposition: Patient tolerated procedure well. Advised to leave dressing on for 6-8 hours. Thereafter patient to wash the area with soap and water and applied band-aid.  ? ?Return in about 3 months (around 05/02/2022) for painful thick nails.  ? ?

## 2022-02-06 ENCOUNTER — Ambulatory Visit: Payer: Medicare Other | Admitting: Podiatry

## 2022-02-13 ENCOUNTER — Other Ambulatory Visit (HOSPITAL_COMMUNITY): Payer: Self-pay

## 2022-02-13 ENCOUNTER — Other Ambulatory Visit: Payer: Self-pay | Admitting: Internal Medicine

## 2022-02-13 ENCOUNTER — Telehealth: Payer: Self-pay

## 2022-02-13 DIAGNOSIS — J411 Mucopurulent chronic bronchitis: Secondary | ICD-10-CM

## 2022-02-13 DIAGNOSIS — E785 Hyperlipidemia, unspecified: Secondary | ICD-10-CM

## 2022-02-13 MED ORDER — ROSUVASTATIN CALCIUM 20 MG PO TABS
20.0000 mg | ORAL_TABLET | Freq: Every day | ORAL | 1 refills | Status: DC
  Filled 2022-02-13: qty 90, 90d supply, fill #0

## 2022-02-13 MED ORDER — ROSUVASTATIN CALCIUM 20 MG PO TABS
20.0000 mg | ORAL_TABLET | Freq: Every day | ORAL | 1 refills | Status: DC
  Filled 2022-04-09: qty 90, 90d supply, fill #0
  Filled 2022-09-09: qty 90, 90d supply, fill #1

## 2022-02-13 MED ORDER — SPIRIVA RESPIMAT 1.25 MCG/ACT IN AERS
2.0000 | INHALATION_SPRAY | Freq: Every day | RESPIRATORY_TRACT | 5 refills | Status: DC
  Filled 2022-02-13: qty 4, 30d supply, fill #0
  Filled 2022-07-01: qty 4, 30d supply, fill #1
  Filled 2022-12-31: qty 4, 30d supply, fill #2

## 2022-02-13 NOTE — Telephone Encounter (Signed)
Pt wife is requesting a refill for: Tiotropium Bromide Monohydrate (SPIRIVA RESPIMAT) 1.25 MCG/ACT AERS  Pharmacy: [id number] [place] cx due to them only using that pharmacy for different medication.

## 2022-04-09 ENCOUNTER — Other Ambulatory Visit (HOSPITAL_COMMUNITY): Payer: Self-pay

## 2022-04-24 ENCOUNTER — Other Ambulatory Visit (HOSPITAL_COMMUNITY): Payer: Self-pay

## 2022-05-01 ENCOUNTER — Ambulatory Visit: Payer: Medicare Other | Admitting: Podiatry

## 2022-05-01 ENCOUNTER — Encounter: Payer: Self-pay | Admitting: Podiatry

## 2022-05-01 DIAGNOSIS — M79675 Pain in left toe(s): Secondary | ICD-10-CM | POA: Diagnosis not present

## 2022-05-01 DIAGNOSIS — M79674 Pain in right toe(s): Secondary | ICD-10-CM | POA: Diagnosis not present

## 2022-05-01 DIAGNOSIS — L84 Corns and callosities: Secondary | ICD-10-CM | POA: Diagnosis not present

## 2022-05-01 DIAGNOSIS — B351 Tinea unguium: Secondary | ICD-10-CM

## 2022-05-01 NOTE — Progress Notes (Signed)
  Subjective:  Patient ID: Taylor Ochoa, male    DOB: Feb 26, 1956,  MRN: [id number]  Chief Complaint  Patient presents with   Nail Problem    Thick painful toenails, 3 month follow up     66 y.o. male presents with the above complaint. History confirmed with patient.  His nails are significantly thickened elongated and causing quite a bit of pain.  He is unable to cut them.  He has multiple lesions on both feet that are quite painful and thinks that may be warts.  Objective:  Physical Exam: warm, good capillary refill, no trophic changes or ulcerative lesions, normal DP and PT pulses, and normal sensory exam.  Multiple painful hyperkeratotic lesions noted Left Foot: dystrophic yellowed discolored nail plates with subungual debris Right Foot: dystrophic yellowed discolored nail plates with subungual debris  Assessment:   1. Pain due to onychomycosis of toenails of both feet   2. Callus of foot      Plan:  Patient was evaluated and treated and all questions answered.  Discussed the etiology and treatment options for the condition in detail with the patient. Educated patient on the topical and oral treatment options for mycotic nails. Recommended debridement of the nails today. Sharp and mechanical debridement performed of all painful and mycotic nails today. Nails debrided in length and thickness using a nail nipper to level of comfort. Discussed treatment options including appropriate shoe gear. Follow up as needed for painful nails.   Hyperkeratotic lesions still quite painful and present.  I discussed chronic and recurrent treatment of these utilizing a cream such as urea.  A prescription for this was sent for compound cream to [facility]  Return in about 3 months (around 08/01/2022) for [place].

## 2022-07-01 ENCOUNTER — Other Ambulatory Visit (HOSPITAL_COMMUNITY): Payer: Self-pay

## 2022-07-01 ENCOUNTER — Other Ambulatory Visit: Payer: Self-pay | Admitting: Internal Medicine

## 2022-07-01 DIAGNOSIS — F411 Generalized anxiety disorder: Secondary | ICD-10-CM

## 2022-07-01 MED ORDER — SERTRALINE HCL 100 MG PO TABS
100.0000 mg | ORAL_TABLET | Freq: Every day | ORAL | 1 refills | Status: DC
  Filled 2022-07-01: qty 90, 90d supply, fill #0
  Filled 2022-11-11: qty 90, 90d supply, fill #1

## 2022-07-12 ENCOUNTER — Telehealth: Payer: Self-pay | Admitting: *Deleted

## 2022-07-12 NOTE — Telephone Encounter (Signed)
Left message for patient to call back to schedule follow up lung cancer screening CT scan.  

## 2022-07-14 ENCOUNTER — Inpatient Hospital Stay (HOSPITAL_COMMUNITY)
Admission: EM | Admit: 2022-07-14 | Discharge: 2022-07-22 | DRG: 522 | Disposition: A | Discharge status: Skilled Nursing Facility | Payer: Medicare Other | Attending: Internal Medicine | Admitting: Internal Medicine

## 2022-07-14 ENCOUNTER — Other Ambulatory Visit: Payer: Self-pay

## 2022-07-14 ENCOUNTER — Emergency Department (HOSPITAL_COMMUNITY): Payer: Medicare Other

## 2022-07-14 ENCOUNTER — Encounter (HOSPITAL_COMMUNITY): Payer: Self-pay | Admitting: *Deleted

## 2022-07-14 DIAGNOSIS — S72001D Fracture of unspecified part of neck of right femur, subsequent encounter for closed fracture with routine healing: Secondary | ICD-10-CM | POA: Diagnosis not present

## 2022-07-14 DIAGNOSIS — Z96641 Presence of right artificial hip joint: Secondary | ICD-10-CM | POA: Diagnosis not present

## 2022-07-14 DIAGNOSIS — W19XXXA Unspecified fall, initial encounter: Secondary | ICD-10-CM

## 2022-07-14 DIAGNOSIS — Z823 Family history of stroke: Secondary | ICD-10-CM | POA: Diagnosis not present

## 2022-07-14 DIAGNOSIS — K219 Gastro-esophageal reflux disease without esophagitis: Secondary | ICD-10-CM | POA: Diagnosis present

## 2022-07-14 DIAGNOSIS — Z8249 Family history of ischemic heart disease and other diseases of the circulatory system: Secondary | ICD-10-CM | POA: Diagnosis not present

## 2022-07-14 DIAGNOSIS — Z720 Tobacco use: Secondary | ICD-10-CM | POA: Diagnosis not present

## 2022-07-14 DIAGNOSIS — I480 Paroxysmal atrial fibrillation: Secondary | ICD-10-CM | POA: Diagnosis not present

## 2022-07-14 DIAGNOSIS — Z818 Family history of other mental and behavioral disorders: Secondary | ICD-10-CM | POA: Diagnosis not present

## 2022-07-14 DIAGNOSIS — F32A Depression, unspecified: Secondary | ICD-10-CM | POA: Diagnosis present

## 2022-07-14 DIAGNOSIS — Z471 Aftercare following joint replacement surgery: Secondary | ICD-10-CM | POA: Diagnosis not present

## 2022-07-14 DIAGNOSIS — S72001A Fracture of unspecified part of neck of right femur, initial encounter for closed fracture: Principal | ICD-10-CM

## 2022-07-14 DIAGNOSIS — W1830XA Fall on same level, unspecified, initial encounter: Secondary | ICD-10-CM | POA: Diagnosis present

## 2022-07-14 DIAGNOSIS — Y92512 Supermarket, store or market as the place of occurrence of the external cause: Secondary | ICD-10-CM

## 2022-07-14 DIAGNOSIS — E785 Hyperlipidemia, unspecified: Secondary | ICD-10-CM | POA: Diagnosis present

## 2022-07-14 DIAGNOSIS — Z79899 Other long term (current) drug therapy: Secondary | ICD-10-CM | POA: Diagnosis not present

## 2022-07-14 DIAGNOSIS — D72829 Elevated white blood cell count, unspecified: Secondary | ICD-10-CM | POA: Diagnosis not present

## 2022-07-14 DIAGNOSIS — J449 Chronic obstructive pulmonary disease, unspecified: Secondary | ICD-10-CM | POA: Diagnosis present

## 2022-07-14 DIAGNOSIS — G9389 Other specified disorders of brain: Secondary | ICD-10-CM | POA: Diagnosis not present

## 2022-07-14 DIAGNOSIS — M1611 Unilateral primary osteoarthritis, right hip: Secondary | ICD-10-CM | POA: Diagnosis present

## 2022-07-14 DIAGNOSIS — R102 Pelvic and perineal pain: Secondary | ICD-10-CM | POA: Diagnosis not present

## 2022-07-14 DIAGNOSIS — F418 Other specified anxiety disorders: Secondary | ICD-10-CM | POA: Diagnosis not present

## 2022-07-14 DIAGNOSIS — Z808 Family history of malignant neoplasm of other organs or systems: Secondary | ICD-10-CM | POA: Diagnosis not present

## 2022-07-14 DIAGNOSIS — Z841 Family history of disorders of kidney and ureter: Secondary | ICD-10-CM | POA: Diagnosis not present

## 2022-07-14 DIAGNOSIS — N401 Enlarged prostate with lower urinary tract symptoms: Secondary | ICD-10-CM | POA: Diagnosis not present

## 2022-07-14 DIAGNOSIS — E7800 Pure hypercholesterolemia, unspecified: Secondary | ICD-10-CM | POA: Diagnosis not present

## 2022-07-14 DIAGNOSIS — M25551 Pain in right hip: Secondary | ICD-10-CM | POA: Diagnosis not present

## 2022-07-14 DIAGNOSIS — J9690 Respiratory failure, unspecified, unspecified whether with hypoxia or hypercapnia: Secondary | ICD-10-CM | POA: Diagnosis not present

## 2022-07-14 DIAGNOSIS — F17200 Nicotine dependence, unspecified, uncomplicated: Secondary | ICD-10-CM | POA: Diagnosis not present

## 2022-07-14 DIAGNOSIS — Z6372 Alcoholism and drug addiction in family: Secondary | ICD-10-CM

## 2022-07-14 DIAGNOSIS — G8929 Other chronic pain: Secondary | ICD-10-CM | POA: Diagnosis not present

## 2022-07-14 DIAGNOSIS — Z7982 Long term (current) use of aspirin: Secondary | ICD-10-CM | POA: Diagnosis not present

## 2022-07-14 DIAGNOSIS — Z01818 Encounter for other preprocedural examination: Secondary | ICD-10-CM | POA: Diagnosis not present

## 2022-07-14 DIAGNOSIS — R9431 Abnormal electrocardiogram [ECG] [EKG]: Secondary | ICD-10-CM | POA: Diagnosis not present

## 2022-07-14 DIAGNOSIS — S0993XA Unspecified injury of face, initial encounter: Secondary | ICD-10-CM | POA: Diagnosis not present

## 2022-07-14 DIAGNOSIS — K5900 Constipation, unspecified: Secondary | ICD-10-CM | POA: Diagnosis not present

## 2022-07-14 DIAGNOSIS — Z811 Family history of alcohol abuse and dependence: Secondary | ICD-10-CM

## 2022-07-14 DIAGNOSIS — S72091A Other fracture of head and neck of right femur, initial encounter for closed fracture: Secondary | ICD-10-CM | POA: Diagnosis not present

## 2022-07-14 DIAGNOSIS — Z833 Family history of diabetes mellitus: Secondary | ICD-10-CM

## 2022-07-14 DIAGNOSIS — W19XXXD Unspecified fall, subsequent encounter: Secondary | ICD-10-CM | POA: Diagnosis not present

## 2022-07-14 DIAGNOSIS — F17210 Nicotine dependence, cigarettes, uncomplicated: Secondary | ICD-10-CM | POA: Diagnosis present

## 2022-07-14 DIAGNOSIS — R410 Disorientation, unspecified: Secondary | ICD-10-CM | POA: Diagnosis not present

## 2022-07-14 DIAGNOSIS — M80051A Age-related osteoporosis with current pathological fracture, right femur, initial encounter for fracture: Secondary | ICD-10-CM | POA: Diagnosis not present

## 2022-07-14 HISTORY — DX: Chronic obstructive pulmonary disease, unspecified: J44.9

## 2022-07-14 HISTORY — DX: Hyperlipidemia, unspecified: E78.5

## 2022-07-14 LAB — CBC WITH DIFFERENTIAL/PLATELET
Abs Immature Granulocytes: 0.08 10*3/uL — ABNORMAL HIGH (ref 0.00–0.07)
Basophils Absolute: 0 10*3/uL (ref 0.0–0.1)
Basophils Relative: 0 %
Eosinophils Absolute: 0 10*3/uL (ref 0.0–0.5)
Eosinophils Relative: 0 %
HCT: 44.8 % (ref 39.0–52.0)
Hemoglobin: 14.8 g/dL (ref 13.0–17.0)
Immature Granulocytes: 1 %
Lymphocytes Relative: 5 %
Lymphs Abs: 0.9 10*3/uL (ref 0.7–4.0)
MCH: 32.7 pg (ref 26.0–34.0)
MCHC: 33 g/dL (ref 30.0–36.0)
MCV: 99.1 fL (ref 80.0–100.0)
Monocytes Absolute: 0.7 10*3/uL (ref 0.1–1.0)
Monocytes Relative: 4 %
Neutro Abs: 15.4 10*3/uL — ABNORMAL HIGH (ref 1.7–7.7)
Neutrophils Relative %: 90 %
Platelets: 205 10*3/uL (ref 150–400)
RBC: 4.52 MIL/uL (ref 4.22–5.81)
RDW: 14.2 % (ref 11.5–15.5)
WBC: 17.1 10*3/uL — ABNORMAL HIGH (ref 4.0–10.5)
nRBC: 0 % (ref 0.0–0.2)

## 2022-07-14 LAB — BASIC METABOLIC PANEL
Anion gap: 16 — ABNORMAL HIGH (ref 5–15)
BUN: 19 mg/dL (ref 8–23)
CO2: 20 mmol/L — ABNORMAL LOW (ref 22–32)
Calcium: 9.1 mg/dL (ref 8.9–10.3)
Chloride: 102 mmol/L (ref 98–111)
Creatinine, Ser: 0.93 mg/dL (ref 0.61–1.24)
GFR, Estimated: >60 mL/min (ref >60)
Glucose, Bld: 110 mg/dL — ABNORMAL HIGH (ref 70–99)
Potassium: 3.8 mmol/L (ref 3.5–5.1)
Sodium: 138 mmol/L (ref 135–145)

## 2022-07-14 LAB — TYPE AND SCREEN
ABO/RH(D): O POS
Antibody Screen: NEGATIVE

## 2022-07-14 LAB — PROTIME-INR
INR: 1 (ref 0.8–1.2)
Prothrombin Time: 12.8 seconds (ref 11.4–15.2)

## 2022-07-14 MED ORDER — ONDANSETRON HCL 4 MG/2ML IJ SOLN
4.0000 mg | Freq: Once | INTRAMUSCULAR | Status: AC
  Administered 2022-07-14: 4 mg via INTRAVENOUS
  Filled 2022-07-14: qty 2

## 2022-07-14 MED ORDER — SODIUM CHLORIDE 0.9 % IV SOLN: INTRAVENOUS | Status: DC

## 2022-07-14 MED ORDER — ACETAMINOPHEN 325 MG PO TABS
650.0000 mg | ORAL_TABLET | Freq: Four times a day (QID) | ORAL | Status: DC | PRN
  Administered 2022-07-15 – 2022-07-19 (×4): 650 mg via ORAL
  Filled 2022-07-14 (×4): qty 2

## 2022-07-14 MED ORDER — NALOXONE HCL 0.4 MG/ML IJ SOLN: 0.4000 mg | INTRAMUSCULAR | Status: DC | PRN

## 2022-07-14 MED ORDER — HYDROMORPHONE HCL 1 MG/ML IJ SOLN
0.5000 mg | INTRAMUSCULAR | Status: DC | PRN
  Administered 2022-07-14: 0.5 mg via INTRAVENOUS
  Filled 2022-07-14: qty 1

## 2022-07-14 MED ORDER — HYDROMORPHONE HCL 1 MG/ML IJ SOLN
0.5000 mg | INTRAMUSCULAR | Status: DC | PRN
  Administered 2022-07-15 – 2022-07-16 (×12): 0.5 mg via INTRAVENOUS
  Filled 2022-07-14: qty 1
  Filled 2022-07-14 (×2): qty 0.5
  Filled 2022-07-14: qty 1
  Filled 2022-07-14: qty 0.5
  Filled 2022-07-14: qty 1
  Filled 2022-07-14 (×2): qty 0.5
  Filled 2022-07-14: qty 1
  Filled 2022-07-14 (×2): qty 0.5
  Filled 2022-07-14: qty 1

## 2022-07-14 MED ORDER — LACTATED RINGERS IV SOLN: INTRAVENOUS | Status: AC

## 2022-07-14 MED ORDER — ACETAMINOPHEN 650 MG RE SUPP: 650.0000 mg | Freq: Four times a day (QID) | RECTAL | Status: DC | PRN

## 2022-07-14 MED ORDER — ONDANSETRON HCL 4 MG/2ML IJ SOLN: 4.0000 mg | Freq: Four times a day (QID) | INTRAMUSCULAR | Status: DC | PRN
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
## 2022-07-14 NOTE — ED Triage Notes (Signed)
Pt arrived by [facility] after a fall at grocery store. Reports walking down a ramp with the cart and fall face first onto the cement. C/o R hip and leg pain. Dried blood noted to R nare and outside of mouth. GCS 15

## 2022-07-14 NOTE — ED Provider Notes (Signed)
[facility] EMERGENCY DEPARTMENT Provider Note   CSN: [id number] Arrival date & time: 07/14/22  2112     History  Chief Complaint  Patient presents with   Lytle Michaels    Taylor Ochoa is a 66 y.o. male.  Patient is a 66 year old male with a history of anemia, GERD, depression who is presenting today after a fall at the grocery store.  He was food pushing his cart out a [employer] going down a ramp when the cart started going too fast and he fell forward hitting his face and right hip on the ground.  Patient denies any loss of consciousness but did report he had a nosebleed with the fall.  That has since subsided.  He denies any neck pain or numbness or weakness in his upper or lower extremities.  He is complaining of severe pain in the right hip that radiates down to the mid femur.  No knee or ankle pain on that side.  He does report having surgery in the past and has a pin in his pelvis.  He states his left side is normal.  He denies any chest pain, shortness of breath and has no abdominal pain.  He takes no anticoagulation.  The history is provided by the patient and the EMS personnel.  Fall       Home Medications Prior to Admission medications   Medication Sig Start Date End Date Taking? Authorizing Provider  acetaminophen (TYLENOL) 500 MG tablet Take 500 mg by mouth every 6 (six) hours as needed.    [provider]  gabapentin (NEURONTIN) 600 MG tablet Take 1 tablet (600 mg total) by mouth 3 (three) times daily. 10/04/21   Janith Lima, MD  pantoprazole (PROTONIX) 40 MG tablet Take 1 tablet (40 mg total) by mouth daily. 08/29/21   Janith Lima, MD  rosuvastatin (CRESTOR) 20 MG tablet Take 1 tablet (20 mg total) by mouth daily. 02/13/22   Janith Lima, MD  sertraline (ZOLOFT) 100 MG tablet Take 1 tablet (100 mg total) by mouth daily. 07/01/22   Janith Lima, MD  tamsulosin (FLOMAX) 0.4 MG CAPS capsule Take 1 capsule (0.4 mg total) by mouth daily.  11/14/21   Biagio Borg, MD  Tiotropium Bromide Monohydrate (SPIRIVA RESPIMAT) 1.25 MCG/ACT AERS Inhale 2 puffs into the lungs daily. 02/13/22   Janith Lima, MD      Allergies    Patient has no known allergies.    Review of Systems   Review of Systems  Physical Exam Updated Vital Signs BP 113/83 (BP Location: Right Arm)   Pulse 92   Temp 97.9 F (36.6 C) (Oral)   Resp 18   Ht '5\' 7"'$  (1.702 m)   Wt 65.8 kg   SpO2 97%   BMI 22.71 kg/m  Physical Exam Vitals and nursing note reviewed.  Constitutional:      General: He is not in acute distress.    Appearance: He is well-developed.  HENT:     Head: Normocephalic and atraumatic.     Comments: Pain and swelling over the bridge of the nose    Nose:     Comments: Dried blood in bilateral nares without significant septal hematoma.  Dried blood on the lips.  No bleeding in the posterior pharynx Eyes:     Conjunctiva/sclera: Conjunctivae normal.     Pupils: Pupils are equal, round, and reactive to light.  Cardiovascular:     Rate and Rhythm: Normal rate  and regular rhythm.     Heart sounds: No murmur heard. Pulmonary:     Effort: Pulmonary effort is normal. No respiratory distress.     Breath sounds: Normal breath sounds. No wheezing or rales.  Abdominal:     General: There is no distension.     Palpations: Abdomen is soft.     Tenderness: There is no abdominal tenderness. There is no guarding or rebound.  Musculoskeletal:        General: Tenderness present.     Cervical back: Normal range of motion and neck supple. No tenderness.     Right hip: Deformity present. Decreased range of motion.     Left hip: Normal.     Comments: Patient's right hip is angulated externally and bent at the knee.  Unable to move due to pain  Skin:    General: Skin is warm and dry.     Findings: No erythema or rash.  Neurological:     Mental Status: He is alert and oriented to person, place, and time.  Psychiatric:        Behavior: Behavior  normal.     ED Results / Procedures / Treatments   Labs (all labs ordered are listed, but only abnormal results are displayed) Labs Reviewed  BASIC METABOLIC PANEL - Abnormal; Notable for the following components:      Result Value   CO2 20 (*)    Glucose, Bld 110 (*)    Anion gap 16 (*)    All other components within normal limits  CBC WITH DIFFERENTIAL/PLATELET - Abnormal; Notable for the following components:   WBC 17.1 (*)    Neutro Abs 15.4 (*)    Abs Immature Granulocytes 0.08 (*)    All other components within normal limits  PROTIME-INR  TYPE AND SCREEN    EKG None  Radiology CT Head Wo Contrast  Result Date: 07/14/2022 CLINICAL DATA:  Fall injury with face plant onto hard concrete surface. EXAM: CT HEAD WITHOUT CONTRAST CT MAXILLOFACIAL WITHOUT CONTRAST TECHNIQUE: Multidetector CT imaging of the head and maxillofacial structures were performed using the standard protocol without intravenous contrast. Multiplanar CT image reconstructions of the maxillofacial structures were also generated. RADIATION DOSE REDUCTION: This exam was performed according to the departmental dose-optimization program which includes automated exposure control, adjustment of the mA and/or kV according to patient size and/or use of iterative reconstruction technique. COMPARISON:  Head CT 12/03/2021, facial CT 04/14/2016, brain MRI 12/04/2021 FINDINGS: CT HEAD FINDINGS Brain: There is mild cerebral atrophy and small vessel disease and moderate chronic ventriculomegaly out of proportion, unchanged. There is a midline posterior fossa arachnoid cyst versus mega cisterna magna. No midline shift, hemorrhage, mass effect or acute cortical based infarct are seen. No new abnormality. Vascular: There are patchy calcifications of the carotid siphons but no hyperdense central vessel. Skull: No appreciable scalp hematoma. The calvarium, skull base and orbits are intact. Other: Mild scattered right mastoid fluid is  again shown. Left mastoids are clear. No middle ear effusions. CT MAXILLOFACIAL FINDINGS Osseous: No facial fracture or mandibular dislocation is seen. Degenerative arthrosis and mild remodeling both TMJs. The patient is edentulous except for small fragments of the roots of the bilateral maxillary canines and a root fragment of either the first or second right maxillary bicuspid, which again demonstrates a periapical lucency which has increased compared to the prior study with cortical dehiscence lateral to the root. Impacted wisdom teeth are still contained within the mandible. There is facial osteopenia. Orbits:  No acute traumatic or inflammatory findings. Interval lens replacements. Sinuses: There is mild membrane disease in the ethmoid air cells. Rest of the sinuses are clear. The ostiomeatal complexes are patent. Nasal septum mildly deviates right. Soft tissues: No appreciable hematoma. IMPRESSION: 1. No acute intracranial CT findings or interval changes. Stable disproportionate ventriculomegaly to peripheral atrophy and small-vessel disease. 2. 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 Also unchanged. 3. No facial fracture is seen. 4. Bilateral TMJ DJD. 5. The root structure for the first or second right maxillary bicuspid is still in place with a periapical lucency. Follow-up with a dentist is recommended. There are small root fragments of both maxillary canines still seen as well. Rest of the teeth have been removed since 2017. 6. Mild scattered right mastoid fluid, chronic. Electronically Signed   By: Telford Nab M.D.   On: 07/14/2022 22:58   CT Maxillofacial Wo Contrast  Result Date: 07/14/2022 CLINICAL DATA:  Fall injury with face plant onto hard concrete surface. EXAM: CT HEAD WITHOUT CONTRAST CT MAXILLOFACIAL WITHOUT CONTRAST TECHNIQUE: Multidetector CT imaging of the head and maxillofacial structures were performed using the standard protocol without intravenous  contrast. Multiplanar CT image reconstructions of the maxillofacial structures were also generated. RADIATION DOSE REDUCTION: This exam was performed according to the departmental dose-optimization program which includes automated exposure control, adjustment of the mA and/or kV according to patient size and/or use of iterative reconstruction technique. COMPARISON:  Head CT 12/03/2021, facial CT 04/14/2016, brain MRI 12/04/2021 FINDINGS: CT HEAD FINDINGS Brain: There is mild cerebral atrophy and small vessel disease and moderate chronic ventriculomegaly out of proportion, unchanged. There is a midline posterior fossa arachnoid cyst versus mega cisterna magna. No midline shift, hemorrhage, mass effect or acute cortical based infarct are seen. No new abnormality. Vascular: There are patchy calcifications of the carotid siphons but no hyperdense central vessel. Skull: No appreciable scalp hematoma. The calvarium, skull base and orbits are intact. Other: Mild scattered right mastoid fluid is again shown. Left mastoids are clear. No middle ear effusions. CT MAXILLOFACIAL FINDINGS Osseous: No facial fracture or mandibular dislocation is seen. Degenerative arthrosis and mild remodeling both TMJs. The patient is edentulous except for small fragments of the roots of the bilateral maxillary canines and a root fragment of either the first or second right maxillary bicuspid, which again demonstrates a periapical lucency which has increased compared to the prior study with cortical dehiscence lateral to the root. Impacted wisdom teeth are still contained within the mandible. There is facial osteopenia. Orbits: No acute traumatic or inflammatory findings. Interval lens replacements. Sinuses: There is mild membrane disease in the ethmoid air cells. Rest of the sinuses are clear. The ostiomeatal complexes are patent. Nasal septum mildly deviates right. Soft tissues: No appreciable hematoma. IMPRESSION: 1. No acute intracranial CT  findings or interval changes. Stable disproportionate ventriculomegaly to peripheral atrophy and small-vessel disease. 2. 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 Also unchanged. 3. No facial fracture is seen. 4. Bilateral TMJ DJD. 5. The root structure for the first or second right maxillary bicuspid is still in place with a periapical lucency. Follow-up with a dentist is recommended. There are small root fragments of both maxillary canines still seen as well. Rest of the teeth have been removed since 2017. 6. Mild scattered right mastoid fluid, chronic. Electronically Signed   By: Telford Nab M.D.   On: 07/14/2022 22:58   DG Pelvis 1-2 Views  Result Date: 07/14/2022 CLINICAL DATA:  Pain after a fall.  Severe right hip pain. EXAM: RIGHT FEMUR 2 VIEWS; PELVIS - 1-2 VIEW COMPARISON:  09/06/2020 FINDINGS: Pelvis demonstrates previous screw fixations across the right SI joint and right superior pubic ramus. Old ununited fractures of the inferior pubic rami bilaterally without change. No acute pelvic fractures are seen. There is an acute transverse fracture of the right femoral neck with varus angulation of the fracture fragments. Mid and distal right femur appears intact. Mild degenerative changes in the medial compartment of the right knee. Vascular calcifications. IMPRESSION: 1. Old fracture deformities and postoperative changes in the pelvis. 2. Acute fracture of the right femoral neck with varus angulation of the fracture fragments. Electronically Signed   By: Lucienne Capers M.D.   On: 07/14/2022 22:35   DG FEMUR, MIN 2 VIEWS RIGHT  Result Date: 07/14/2022 CLINICAL DATA:  Pain after a fall.  Severe right hip pain. EXAM: RIGHT FEMUR 2 VIEWS; PELVIS - 1-2 VIEW COMPARISON:  09/06/2020 FINDINGS: Pelvis demonstrates previous screw fixations across the right SI joint and right superior pubic ramus. Old ununited fractures of the inferior pubic rami bilaterally without change. No  acute pelvic fractures are seen. There is an acute transverse fracture of the right femoral neck with varus angulation of the fracture fragments. Mid and distal right femur appears intact. Mild degenerative changes in the medial compartment of the right knee. Vascular calcifications. IMPRESSION: 1. Old fracture deformities and postoperative changes in the pelvis. 2. Acute fracture of the right femoral neck with varus angulation of the fracture fragments. Electronically Signed   By: Lucienne Capers M.D.   On: 07/14/2022 22:35   DG Chest 1 View  Result Date: 07/14/2022 CLINICAL DATA:  Fall.  Preop chest radiograph. EXAM: CHEST  1 VIEW COMPARISON:  Chest radiograph dated 07/26/2021. FINDINGS: The lungs are clear. There is no pleural effusion pneumothorax. Top-normal cardiac size. No acute osseous pathology. IMPRESSION: No active disease. Electronically Signed   By: Anner Crete M.D.   On: 07/14/2022 22:34    Procedures Procedures    Medications Ordered in ED Medications  0.9 %  sodium chloride infusion (has no administration in time range)  HYDROmorphone (DILAUDID) injection 0.5 mg (0.5 mg Intravenous Given 07/14/22 2153)  ondansetron (ZOFRAN) injection 4 mg (4 mg Intravenous Given 07/14/22 2152)    ED Course/ Medical Decision Making/ A&P                           Medical Decision Making Amount and/or Complexity of Data Reviewed Labs: ordered. Decision-making details documented in ED Course. Radiology: ordered and independent interpretation performed. Decision-making details documented in ED Course.  Risk Prescription drug management. Decision regarding hospitalization.   Pt with multiple medical problems and comorbidities and presenting today with a complaint that caries a high risk for morbidity and mortality.  Here today after a fall at [employer] when he was going down the ramp and lost his balance.  No LOC and low suspicion for an acute cardiac or pulmonary cause for his fall.   Patient is having significant right hip pain and concern for possible fracture.  Also facial injury.  No neck tenderness and neurovascularly intact.  Hip fracture protocol initiated.  Patient given pain control.  Vital signs are stable and patient takes no anticoagulation.  11:01 PM I independently interpreted patient's labs and patient has a leukocytosis today of 17 which is most likely acute phase reaction, BMP without acute findings.  I have independently visualized and interpreted pt's images today.  Plain films show a fracture of the femoral neck of the femur.  Chest x-ray within normal limits.  Head CT without evidence of acute findings.  On reevaluation patient still having hip pain.  Discussed the findings with the patient and his need for hospitalization.  Orthopedics was consulted.  Patient will need hospitalist admission.          Final Clinical Impression(s) / ED Diagnoses Final diagnoses:  Fall, initial encounter  Closed fracture of neck of right femur, initial encounter [facility])    Rx / DC Orders ED Discharge Orders     None         Blanchie Dessert, MD 07/14/22 [phone]

## 2022-07-15 ENCOUNTER — Other Ambulatory Visit: Payer: Self-pay

## 2022-07-15 ENCOUNTER — Encounter (HOSPITAL_COMMUNITY): Payer: Self-pay | Admitting: Internal Medicine

## 2022-07-15 DIAGNOSIS — J449 Chronic obstructive pulmonary disease, unspecified: Secondary | ICD-10-CM | POA: Diagnosis present

## 2022-07-15 DIAGNOSIS — W19XXXA Unspecified fall, initial encounter: Secondary | ICD-10-CM

## 2022-07-15 DIAGNOSIS — S72001A Fracture of unspecified part of neck of right femur, initial encounter for closed fracture: Secondary | ICD-10-CM | POA: Diagnosis not present

## 2022-07-15 LAB — CBC WITH DIFFERENTIAL/PLATELET
Abs Immature Granulocytes: 0.07 10*3/uL (ref 0.00–0.07)
Basophils Absolute: 0 10*3/uL (ref 0.0–0.1)
Basophils Relative: 0 %
Eosinophils Absolute: 0 10*3/uL (ref 0.0–0.5)
Eosinophils Relative: 0 %
HCT: 40.5 % (ref 39.0–52.0)
Hemoglobin: 13.8 g/dL (ref 13.0–17.0)
Immature Granulocytes: 1 %
Lymphocytes Relative: 6 %
Lymphs Abs: 0.9 10*3/uL (ref 0.7–4.0)
MCH: 32.9 pg (ref 26.0–34.0)
MCHC: 34.1 g/dL (ref 30.0–36.0)
MCV: 96.4 fL (ref 80.0–100.0)
Monocytes Absolute: 0.8 10*3/uL (ref 0.1–1.0)
Monocytes Relative: 5 %
Neutro Abs: 12.7 10*3/uL — ABNORMAL HIGH (ref 1.7–7.7)
Neutrophils Relative %: 88 %
Platelets: 211 10*3/uL (ref 150–400)
RBC: 4.2 MIL/uL — ABNORMAL LOW (ref 4.22–5.81)
RDW: 14.2 % (ref 11.5–15.5)
WBC: 14.5 10*3/uL — ABNORMAL HIGH (ref 4.0–10.5)
nRBC: 0 % (ref 0.0–0.2)

## 2022-07-15 LAB — COMPREHENSIVE METABOLIC PANEL
ALT: 14 U/L (ref 0–44)
AST: 22 U/L (ref 15–41)
Albumin: 3.6 g/dL (ref 3.5–5.0)
Alkaline Phosphatase: 56 U/L (ref 38–126)
Anion gap: 12 (ref 5–15)
BUN: 18 mg/dL (ref 8–23)
CO2: 22 mmol/L (ref 22–32)
Calcium: 8.8 mg/dL — ABNORMAL LOW (ref 8.9–10.3)
Chloride: 104 mmol/L (ref 98–111)
Creatinine, Ser: 0.82 mg/dL (ref 0.61–1.24)
GFR, Estimated: >60 mL/min (ref >60)
Glucose, Bld: 116 mg/dL — ABNORMAL HIGH (ref 70–99)
Potassium: 4.3 mmol/L (ref 3.5–5.1)
Sodium: 138 mmol/L (ref 135–145)
Total Bilirubin: 1.2 mg/dL (ref 0.3–1.2)
Total Protein: 6.4 g/dL — ABNORMAL LOW (ref 6.5–8.1)

## 2022-07-15 LAB — VITAMIN D 25 HYDROXY (VIT D DEFICIENCY, FRACTURES): Vit D, 25-Hydroxy: 24.04 ng/mL — ABNORMAL LOW (ref 30–100)

## 2022-07-15 LAB — SURGICAL PCR SCREEN
MRSA, PCR: NEGATIVE
Staphylococcus aureus: NEGATIVE

## 2022-07-15 LAB — MAGNESIUM: Magnesium: 1.8 mg/dL (ref 1.7–2.4)

## 2022-07-15 MED ORDER — TIOTROPIUM BROMIDE MONOHYDRATE 1.25 MCG/ACT IN AERS: 2.0000 | INHALATION_SPRAY | Freq: Every day | RESPIRATORY_TRACT | Status: DC

## 2022-07-15 MED ORDER — SERTRALINE HCL 100 MG PO TABS: 100.0000 mg | ORAL_TABLET | Freq: Every day | ORAL | Status: DC

## 2022-07-15 MED ORDER — ROSUVASTATIN CALCIUM 20 MG PO TABS: 20.0000 mg | ORAL_TABLET | Freq: Every day | ORAL | Status: DC

## 2022-07-15 MED ORDER — ENOXAPARIN SODIUM 40 MG/0.4ML IJ SOSY
40.0000 mg | PREFILLED_SYRINGE | Freq: Once | INTRAMUSCULAR | Status: AC
  Administered 2022-07-15: 40 mg via SUBCUTANEOUS
  Filled 2022-07-15: qty 0.4

## 2022-07-15 MED ORDER — ALBUTEROL SULFATE (2.5 MG/3ML) 0.083% IN NEBU: 2.5000 mg | INHALATION_SOLUTION | RESPIRATORY_TRACT | Status: DC | PRN

## 2022-07-15 MED ORDER — GABAPENTIN 300 MG PO CAPS: 600.0000 mg | ORAL_CAPSULE | Freq: Three times a day (TID) | ORAL | Status: DC

## 2022-07-15 MED ORDER — UMECLIDINIUM BROMIDE 62.5 MCG/ACT IN AEPB
1.0000 | INHALATION_SPRAY | Freq: Every day | RESPIRATORY_TRACT | Status: DC
  Filled 2022-07-15 (×2): qty 7

## 2022-07-15 MED ORDER — PANTOPRAZOLE SODIUM 40 MG IV SOLR
40.0000 mg | INTRAVENOUS | Status: DC
  Administered 2022-07-15 – 2022-07-17 (×3): 40 mg via INTRAVENOUS
  Filled 2022-07-15 (×3): qty 10
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
## 2022-07-15 NOTE — Anesthesia Preprocedure Evaluation (Addendum)
Anesthesia Evaluation  Patient identified by MRN, date of birth, ID band Patient awake    Reviewed: Allergy & Precautions, NPO status , Patient's Chart, lab work & pertinent test results  Airway Mallampati: II  TM Distance: >3 FB Neck ROM: Full    Dental no notable dental hx. (+) Teeth Intact, Dental Advisory Given   Pulmonary COPD,  COPD inhaler, Current Smoker and Patient abstained from smoking.,    Pulmonary exam normal breath sounds clear to auscultation       Cardiovascular Normal cardiovascular exam Rhythm:Regular Rate:Normal     Neuro/Psych PSYCHIATRIC DISORDERS Anxiety Depression    GI/Hepatic GERD  Medicated,  Endo/Other    Renal/GU Lab Results      Component                Value               Date                      CREATININE               0.82                07/15/2022                 K                        4.3                 07/15/2022                      Musculoskeletal negative musculoskeletal ROS (+)   Abdominal   Peds  Hematology Lab Results      Component                Value               Date                          HGB                      13.8                07/15/2022                HCT                      40.5                07/15/2022                 PLT                      211                 07/15/2022              Anesthesia Other Findings   Reproductive/Obstetrics                            Anesthesia Physical Anesthesia Plan  ASA: 3  Anesthesia Plan: Spinal   Post-op Pain Management: Regional block* and Minimal or no pain anticipated   Induction:   PONV Risk Score and Plan: 2 and Treatment may vary due  to age or medical condition, Midazolam and Ondansetron  Airway Management Planned: Natural Airway and Nasal Cannula  Additional Equipment: None  Intra-op Plan:   Post-operative Plan:   Informed Consent: I have reviewed the patients  History and Physical, chart, labs and discussed the procedure including the risks, benefits and alternatives for the proposed anesthesia with the patient or authorized representative who has indicated his/her understanding and acceptance.     Dental advisory given  Plan Discussed with:   Anesthesia Plan Comments:        Anesthesia Quick Evaluation
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
## 2022-07-15 NOTE — Progress Notes (Signed)
PROGRESS NOTE    Taylor Ochoa  XTA:[id number] DOB: 02-12-1956 DOA: 07/14/2022 PCP: Janith Lima, MD   Brief Narrative:   Taylor Ochoa is a 66 y.o. male with medical history significant for hyperlipidemia, COPD, who is admitted to [facility] on 07/14/2022 with acute right femoral neck fracture after presenting from home to [facility] ED complaining of right hip pain following, mechanical fall earlier in the day.    Assessment & Plan:   Principal Problem:   Closed fracture of neck of right femur (HCC) Active Problems:   Depression   GERD (gastroesophageal reflux disease)   Leukocytosis   Hyperlipidemia with target LDL less than 130   Fall   COPD (chronic obstructive pulmonary disease) (HCC)  Mechanical fall/acute right femoral neck fracture: Orthopedics on board, plan for surgery tomorrow, per orthopedics note today.  He will be transferred to [facility].  Will allow him to eat.  Will defer to orthopedics to place orders for n.p.o. for midnight if they really have solid plans.  Leukocytosis: Likely reactive.  Improving.  No signs of infection.  he is afebrile.  Depression: Continue Zoloft.  GERD: Continue Protonix.  Hyperlipidemia: Continue Crestor.  DVT prophylaxis: SCDs Start: 07/14/22 2315 now that the surgery is not planned until tomorrow morning, I will give him a dose of Lovenox today.   Code Status: Full Code  Family Communication: Wife present at bedside.  Plan of care discussed with patient in length and he/she verbalized understanding and agreed with it.  Status is: Inpatient Remains inpatient appropriate because: Hip replacement surgery planned for tomorrow.   Estimated body mass index is 22.71 kg/m as calculated from the following:   Height as of this encounter: '5\' 7"'$  (1.702 m).   Weight as of this encounter: 65.8 kg.    Nutritional Assessment: Body mass index is 22.71 kg/m.Marland Kitchen Seen by dietician.  I agree with the assessment and plan as outlined  below: Nutrition Status:        . Skin Assessment: I have examined the patient's skin and I agree with the wound assessment as performed by the wound care RN as outlined below:    Consultants:  Orthopedics  Procedures:  None  Antimicrobials:  Anti-infectives (From admission, onward)    None         Subjective: Seen and examined in the ED.  Wife at the bedside.  Complains of hip pain with movement but otherwise feels okay.  No complaints.  Wants to eat.  Objective: Vitals:   07/15/22 0815 07/15/22 0830 07/15/22 0900 07/15/22 0919  BP: (!) 142/76 139/77 (!) 143/90   Pulse: 76 77 74   Resp: '15 17 15   '$ Temp:    98.4 F (36.9 C)  TempSrc:    Oral  SpO2: 94% 95% 95%   Weight:      Height:       No intake or output data in the 24 hours ending 07/15/22 1012 Filed Weights   07/14/22 2120  Weight: 65.8 kg    Examination:  General exam: Appears calm and comfortable  Respiratory system: Clear to auscultation. Respiratory effort normal. Cardiovascular system: S1 & S2 heard, RRR. No JVD, murmurs, rubs, gallops or clicks. No pedal edema. Gastrointestinal system: Abdomen is nondistended, soft and nontender. No organomegaly or masses felt. Normal bowel sounds heard. Central nervous system: Alert and oriented. No focal neurological deficits. Psychiatry: Judgement and insight appear normal. Mood & affect appropriate.    Data Reviewed: I have  personally reviewed following labs and imaging studies  CBC: Recent Labs  Lab 07/14/22 2155 07/15/22 0337  WBC 17.1* 14.5*  NEUTROABS 15.4* 12.7*  HGB 14.8 13.8  HCT 44.8 40.5  MCV 99.1 96.4  PLT 205 409   Basic Metabolic Panel: Recent Labs  Lab 07/14/22 2155 07/15/22 0337  NA 138 138  K 3.8 4.3  CL 102 104  CO2 20* 22  GLUCOSE 110* 116*  BUN 19 18  CREATININE 0.93 0.82  CALCIUM 9.1 8.8*  MG  --  1.8   GFR: Estimated Creatinine Clearance: 82.5 mL/min (by C-G formula based on SCr of 0.82 mg/dL). Liver Function  Tests: Recent Labs  Lab 07/15/22 0337  AST 22  ALT 14  ALKPHOS 56  BILITOT 1.2  PROT 6.4*  ALBUMIN 3.6   No results for input(s): "LIPASE", "AMYLASE" in the last 168 hours. No results for input(s): "AMMONIA" in the last 168 hours. Coagulation Profile: Recent Labs  Lab 07/14/22 2155  INR 1.0   Cardiac Enzymes: No results for input(s): "CKTOTAL", "CKMB", "CKMBINDEX", "TROPONINI" in the last 168 hours. BNP (last 3 results) No results for input(s): "PROBNP" in the last 8760 hours. HbA1C: No results for input(s): "HGBA1C" in the last 72 hours. CBG: No results for input(s): "GLUCAP" in the last 168 hours. Lipid Profile: No results for input(s): "CHOL", "HDL", "LDLCALC", "TRIG", "CHOLHDL", "LDLDIRECT" in the last 72 hours. Thyroid Function Tests: No results for input(s): "TSH", "T4TOTAL", "FREET4", "T3FREE", "THYROIDAB" in the last 72 hours. Anemia Panel: No results for input(s): "VITAMINB12", "FOLATE", "FERRITIN", "TIBC", "IRON", "RETICCTPCT" in the last 72 hours. Sepsis Labs: No results for input(s): "PROCALCITON", "LATICACIDVEN" in the last 168 hours.  No results found for this or any previous visit (from the past 240 hour(s)).   Radiology Studies: CT Head Wo Contrast  Result Date: 07/14/2022 CLINICAL DATA:  Fall injury with face plant onto hard concrete surface. EXAM: CT HEAD WITHOUT CONTRAST CT MAXILLOFACIAL WITHOUT CONTRAST TECHNIQUE: Multidetector CT imaging of the head and maxillofacial structures were performed using the standard protocol without intravenous contrast. Multiplanar CT image reconstructions of the maxillofacial structures were also generated. RADIATION DOSE REDUCTION: This exam was performed according to the departmental dose-optimization program which includes automated exposure control, adjustment of the mA and/or kV according to patient size and/or use of iterative reconstruction technique. COMPARISON:  Head CT 12/03/2021, facial CT 04/14/2016, brain MRI  12/04/2021 FINDINGS: CT HEAD FINDINGS Brain: There is mild cerebral atrophy and small vessel disease and moderate chronic ventriculomegaly out of proportion, unchanged. There is a midline posterior fossa arachnoid cyst versus mega cisterna magna. No midline shift, hemorrhage, mass effect or acute cortical based infarct are seen. No new abnormality. Vascular: There are patchy calcifications of the carotid siphons but no hyperdense central vessel. Skull: No appreciable scalp hematoma. The calvarium, skull base and orbits are intact. Other: Mild scattered right mastoid fluid is again shown. Left mastoids are clear. No middle ear effusions. CT MAXILLOFACIAL FINDINGS Osseous: No facial fracture or mandibular dislocation is seen. Degenerative arthrosis and mild remodeling both TMJs. The patient is edentulous except for small fragments of the roots of the bilateral maxillary canines and a root fragment of either the first or second right maxillary bicuspid, which again demonstrates a periapical lucency which has increased compared to the prior study with cortical dehiscence lateral to the root. Impacted wisdom teeth are still contained within the mandible. There is facial osteopenia. Orbits: No acute traumatic or inflammatory findings. Interval lens replacements. Sinuses: There  is mild membrane disease in the ethmoid air cells. Rest of the sinuses are clear. The ostiomeatal complexes are patent. Nasal septum mildly deviates right. Soft tissues: No appreciable hematoma. IMPRESSION: 1. No acute intracranial CT findings or interval changes. Stable disproportionate ventriculomegaly to peripheral atrophy and small-vessel disease. 2. 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 Also unchanged. 3. No facial fracture is seen. 4. Bilateral TMJ DJD. 5. The root structure for the first or second right maxillary bicuspid is still in place with a periapical lucency. Follow-up with a dentist is recommended. There  are small root fragments of both maxillary canines still seen as well. Rest of the teeth have been removed since 2017. 6. Mild scattered right mastoid fluid, chronic. Electronically Signed   By: Telford Nab M.D.   On: 07/14/2022 22:58   CT Maxillofacial Wo Contrast  Result Date: 07/14/2022 CLINICAL DATA:  Fall injury with face plant onto hard concrete surface. EXAM: CT HEAD WITHOUT CONTRAST CT MAXILLOFACIAL WITHOUT CONTRAST TECHNIQUE: Multidetector CT imaging of the head and maxillofacial structures were performed using the standard protocol without intravenous contrast. Multiplanar CT image reconstructions of the maxillofacial structures were also generated. RADIATION DOSE REDUCTION: This exam was performed according to the departmental dose-optimization program which includes automated exposure control, adjustment of the mA and/or kV according to patient size and/or use of iterative reconstruction technique. COMPARISON:  Head CT 12/03/2021, facial CT 04/14/2016, brain MRI 12/04/2021 FINDINGS: CT HEAD FINDINGS Brain: There is mild cerebral atrophy and small vessel disease and moderate chronic ventriculomegaly out of proportion, unchanged. There is a midline posterior fossa arachnoid cyst versus mega cisterna magna. No midline shift, hemorrhage, mass effect or acute cortical based infarct are seen. No new abnormality. Vascular: There are patchy calcifications of the carotid siphons but no hyperdense central vessel. Skull: No appreciable scalp hematoma. The calvarium, skull base and orbits are intact. Other: Mild scattered right mastoid fluid is again shown. Left mastoids are clear. No middle ear effusions. CT MAXILLOFACIAL FINDINGS Osseous: No facial fracture or mandibular dislocation is seen. Degenerative arthrosis and mild remodeling both TMJs. The patient is edentulous except for small fragments of the roots of the bilateral maxillary canines and a root fragment of either the first or second right  maxillary bicuspid, which again demonstrates a periapical lucency which has increased compared to the prior study with cortical dehiscence lateral to the root. Impacted wisdom teeth are still contained within the mandible. There is facial osteopenia. Orbits: No acute traumatic or inflammatory findings. Interval lens replacements. Sinuses: There is mild membrane disease in the ethmoid air cells. Rest of the sinuses are clear. The ostiomeatal complexes are patent. Nasal septum mildly deviates right. Soft tissues: No appreciable hematoma. IMPRESSION: 1. No acute intracranial CT findings or interval changes. Stable disproportionate ventriculomegaly to peripheral atrophy and small-vessel disease. 2. 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 Also unchanged. 3. No facial fracture is seen. 4. Bilateral TMJ DJD. 5. The root structure for the first or second right maxillary bicuspid is still in place with a periapical lucency. Follow-up with a dentist is recommended. There are small root fragments of both maxillary canines still seen as well. Rest of the teeth have been removed since 2017. 6. Mild scattered right mastoid fluid, chronic. Electronically Signed   By: Telford Nab M.D.   On: 07/14/2022 22:58   DG Pelvis 1-2 Views  Result Date: 07/14/2022 CLINICAL DATA:  Pain after a fall.  Severe right hip pain. EXAM: RIGHT  FEMUR 2 VIEWS; PELVIS - 1-2 VIEW COMPARISON:  09/06/2020 FINDINGS: Pelvis demonstrates previous screw fixations across the right SI joint and right superior pubic ramus. Old ununited fractures of the inferior pubic rami bilaterally without change. No acute pelvic fractures are seen. There is an acute transverse fracture of the right femoral neck with varus angulation of the fracture fragments. Mid and distal right femur appears intact. Mild degenerative changes in the medial compartment of the right knee. Vascular calcifications. IMPRESSION: 1. Old fracture deformities and  postoperative changes in the pelvis. 2. Acute fracture of the right femoral neck with varus angulation of the fracture fragments. Electronically Signed   By: Lucienne Capers M.D.   On: 07/14/2022 22:35   DG FEMUR, MIN 2 VIEWS RIGHT  Result Date: 07/14/2022 CLINICAL DATA:  Pain after a fall.  Severe right hip pain. EXAM: RIGHT FEMUR 2 VIEWS; PELVIS - 1-2 VIEW COMPARISON:  09/06/2020 FINDINGS: Pelvis demonstrates previous screw fixations across the right SI joint and right superior pubic ramus. Old ununited fractures of the inferior pubic rami bilaterally without change. No acute pelvic fractures are seen. There is an acute transverse fracture of the right femoral neck with varus angulation of the fracture fragments. Mid and distal right femur appears intact. Mild degenerative changes in the medial compartment of the right knee. Vascular calcifications. IMPRESSION: 1. Old fracture deformities and postoperative changes in the pelvis. 2. Acute fracture of the right femoral neck with varus angulation of the fracture fragments. Electronically Signed   By: Lucienne Capers M.D.   On: 07/14/2022 22:35   DG Chest 1 View  Result Date: 07/14/2022 CLINICAL DATA:  Fall.  Preop chest radiograph. EXAM: CHEST  1 VIEW COMPARISON:  Chest radiograph dated 07/26/2021. FINDINGS: The lungs are clear. There is no pleural effusion pneumothorax. Top-normal cardiac size. No acute osseous pathology. IMPRESSION: No active disease. Electronically Signed   By: Anner Crete M.D.   On: 07/14/2022 22:34    Scheduled Meds:  pantoprazole (PROTONIX) IV  40 mg Intravenous Q24H   umeclidinium bromide  1 puff Inhalation Daily   Continuous Infusions:  sodium chloride 10 mL/hr at 07/15/22 0754     LOS: 1 day   Darliss Cheney, MD [facility]  07/15/2022, 10:12 AM   *Please note that this is a verbal dictation therefore any spelling or grammatical errors are due to the "[employer]" system interpretation.  Please  page via [place] and do not message via secure chat for urgent patient care matters. Secure chat can be used for non urgent patient care matters.  How to contact the [facility] Attending or Consulting provider [place] or covering provider during after hours [place], for this patient?  Check the care team in [facility] and look for a) attending/consulting [facility] provider listed and b) the [facility] team listed. Page or secure chat [phone]. Log into [email or web address] and use 's universal password to access. If you do not have the password, please contact the hospital operator. Locate the [facility] provider you are looking for under [facility] and page to a number that you can be directly reached. If you still have difficulty reaching the provider, please page the [facility] (Director on Call) for the Hospitalists listed on amion for assistance.

## 2022-07-15 NOTE — ED Notes (Signed)
[facility] at bedside 

## 2022-07-15 NOTE — Plan of Care (Signed)
?  Problem: Clinical Measurements: ?Goal: Will remain free from infection ?Outcome: Progressing ?  ?

## 2022-07-15 NOTE — Hospital Course (Addendum)
Mr. Saha is a 66  yo male with PMH HLD, COPD, depression, GERD who presented after a mechanical fall.  He landed on his right side and was brought to the ER for evaluation. Pelvis imaging revealed acute right femoral neck fracture.  He was evaluated by orthopedic surgery and underwent right total hip replacement on 07/16/2022.

## 2022-07-16 ENCOUNTER — Encounter (HOSPITAL_COMMUNITY): Payer: Self-pay | Admitting: Internal Medicine

## 2022-07-16 ENCOUNTER — Inpatient Hospital Stay (HOSPITAL_COMMUNITY): Payer: Medicare Other

## 2022-07-16 ENCOUNTER — Encounter (HOSPITAL_COMMUNITY)
Admission: EM | Disposition: A | Discharge status: Skilled Nursing Facility | Payer: Self-pay | Source: Home / Self Care | Attending: Internal Medicine

## 2022-07-16 ENCOUNTER — Inpatient Hospital Stay (HOSPITAL_COMMUNITY): Admission: RE | Admit: 2022-07-16 | Payer: Medicare Other | Source: Home / Self Care | Admitting: Orthopedic Surgery

## 2022-07-16 ENCOUNTER — Inpatient Hospital Stay (HOSPITAL_COMMUNITY): Payer: Medicare Other | Admitting: Anesthesiology

## 2022-07-16 ENCOUNTER — Other Ambulatory Visit: Payer: Self-pay

## 2022-07-16 DIAGNOSIS — S72001A Fracture of unspecified part of neck of right femur, initial encounter for closed fracture: Secondary | ICD-10-CM | POA: Diagnosis not present

## 2022-07-16 DIAGNOSIS — M1611 Unilateral primary osteoarthritis, right hip: Secondary | ICD-10-CM | POA: Diagnosis not present

## 2022-07-16 DIAGNOSIS — F17210 Nicotine dependence, cigarettes, uncomplicated: Secondary | ICD-10-CM | POA: Diagnosis not present

## 2022-07-16 DIAGNOSIS — Z96641 Presence of right artificial hip joint: Secondary | ICD-10-CM

## 2022-07-16 DIAGNOSIS — F418 Other specified anxiety disorders: Secondary | ICD-10-CM | POA: Diagnosis not present

## 2022-07-16 DIAGNOSIS — J449 Chronic obstructive pulmonary disease, unspecified: Secondary | ICD-10-CM

## 2022-07-16 HISTORY — PX: TOTAL HIP ARTHROPLASTY: SHX124

## 2022-07-16 LAB — CBC WITH DIFFERENTIAL/PLATELET
Abs Immature Granulocytes: 0.05 10*3/uL (ref 0.00–0.07)
Basophils Absolute: 0 10*3/uL (ref 0.0–0.1)
Basophils Relative: 0 %
Eosinophils Absolute: 0 10*3/uL (ref 0.0–0.5)
Eosinophils Relative: 0 %
HCT: 41.9 % (ref 39.0–52.0)
Hemoglobin: 14.1 g/dL (ref 13.0–17.0)
Immature Granulocytes: 0 %
Lymphocytes Relative: 7 %
Lymphs Abs: 0.9 10*3/uL (ref 0.7–4.0)
MCH: 32.8 pg (ref 26.0–34.0)
MCHC: 33.7 g/dL (ref 30.0–36.0)
MCV: 97.4 fL (ref 80.0–100.0)
Monocytes Absolute: 0.9 10*3/uL (ref 0.1–1.0)
Monocytes Relative: 7 %
Neutro Abs: 10.1 10*3/uL — ABNORMAL HIGH (ref 1.7–7.7)
Neutrophils Relative %: 86 %
Platelets: 193 10*3/uL (ref 150–400)
RBC: 4.3 MIL/uL (ref 4.22–5.81)
RDW: 14.2 % (ref 11.5–15.5)
WBC: 11.9 10*3/uL — ABNORMAL HIGH (ref 4.0–10.5)
nRBC: 0 % (ref 0.0–0.2)

## 2022-07-16 LAB — TYPE AND SCREEN
ABO/RH(D): O POS
Antibody Screen: NEGATIVE

## 2022-07-16 SURGERY — ARTHROPLASTY, HIP, TOTAL, ANTERIOR APPROACH
Anesthesia: Spinal | Site: Hip | Laterality: Right

## 2022-07-16 MED ORDER — ALBUTEROL SULFATE HFA 108 (90 BASE) MCG/ACT IN AERS
INHALATION_SPRAY | RESPIRATORY_TRACT | Status: DC | PRN
  Administered 2022-07-16: 2 via RESPIRATORY_TRACT

## 2022-07-16 MED ORDER — DEXAMETHASONE SODIUM PHOSPHATE 10 MG/ML IJ SOLN
INTRAMUSCULAR | Status: DC | PRN
  Administered 2022-07-16: 10 mg via INTRAVENOUS

## 2022-07-16 MED ORDER — LACTATED RINGERS IV SOLN: INTRAVENOUS | Status: DC | PRN

## 2022-07-16 MED ORDER — SENNA 8.6 MG PO TABS
2.0000 | ORAL_TABLET | Freq: Every day | ORAL | Status: DC
  Administered 2022-07-16 – 2022-07-21 (×6): 17.2 mg via ORAL
  Filled 2022-07-16 (×6): qty 2

## 2022-07-16 MED ORDER — MENTHOL 3 MG MT LOZG: 1.0000 | LOZENGE | OROMUCOSAL | Status: DC | PRN

## 2022-07-16 MED ORDER — MIDAZOLAM HCL 2 MG/2ML IJ SOLN
INTRAMUSCULAR | Status: AC
  Filled 2022-07-16: qty 2

## 2022-07-16 MED ORDER — METOCLOPRAMIDE HCL 5 MG PO TABS: 5.0000 mg | ORAL_TABLET | Freq: Three times a day (TID) | ORAL | Status: DC | PRN

## 2022-07-16 MED ORDER — FENTANYL CITRATE (PF) 100 MCG/2ML IJ SOLN
INTRAMUSCULAR | Status: AC
  Filled 2022-07-16: qty 2

## 2022-07-16 MED ORDER — ACETAMINOPHEN 10 MG/ML IV SOLN: 1000.0000 mg | Freq: Once | INTRAVENOUS | Status: DC | PRN

## 2022-07-16 MED ORDER — POVIDONE-IODINE 10 % EX SWAB
2.0000 | Freq: Once | CUTANEOUS | Status: AC
  Administered 2022-07-16: 2 via TOPICAL

## 2022-07-16 MED ORDER — PHENOL 1.4 % MT LIQD: 1.0000 | OROMUCOSAL | Status: DC | PRN

## 2022-07-16 MED ORDER — METHOCARBAMOL 1000 MG/10ML IJ SOLN: 500.0000 mg | Freq: Four times a day (QID) | INTRAVENOUS | Status: DC | PRN

## 2022-07-16 MED ORDER — FENTANYL CITRATE (PF) 100 MCG/2ML IJ SOLN
INTRAMUSCULAR | Status: DC | PRN
  Administered 2022-07-16 (×2): 50 ug via INTRAVENOUS

## 2022-07-16 MED ORDER — TRANEXAMIC ACID-NACL 1000-0.7 MG/100ML-% IV SOLN
1000.0000 mg | Freq: Once | INTRAVENOUS | Status: AC
  Administered 2022-07-16: 1000 mg via INTRAVENOUS
  Filled 2022-07-16: qty 100

## 2022-07-16 MED ORDER — FERROUS SULFATE 325 (65 FE) MG PO TABS
325.0000 mg | ORAL_TABLET | Freq: Three times a day (TID) | ORAL | Status: DC
  Administered 2022-07-16 – 2022-07-22 (×16): 325 mg via ORAL
  Filled 2022-07-16 (×16): qty 1

## 2022-07-16 MED ORDER — PROPOFOL 1000 MG/100ML IV EMUL
INTRAVENOUS | Status: AC
  Filled 2022-07-16: qty 100

## 2022-07-16 MED ORDER — HYDROCODONE-ACETAMINOPHEN 5-325 MG PO TABS: 1.0000 | ORAL_TABLET | ORAL | Status: DC | PRN

## 2022-07-16 MED ORDER — DEXAMETHASONE SODIUM PHOSPHATE 10 MG/ML IJ SOLN: 8.0000 mg | Freq: Once | INTRAMUSCULAR | Status: DC

## 2022-07-16 MED ORDER — MORPHINE SULFATE (PF) 2 MG/ML IV SOLN
0.5000 mg | INTRAVENOUS | Status: DC | PRN
  Administered 2022-07-17: 1 mg via INTRAVENOUS
  Filled 2022-07-16 (×2): qty 1

## 2022-07-16 MED ORDER — STERILE WATER FOR IRRIGATION IR SOLN
Status: DC | PRN
  Administered 2022-07-16: 2000 mL

## 2022-07-16 MED ORDER — 0.9 % SODIUM CHLORIDE (POUR BTL) OPTIME
TOPICAL | Status: DC | PRN
  Administered 2022-07-16: 1000 mL

## 2022-07-16 MED ORDER — ONDANSETRON HCL 4 MG/2ML IJ SOLN
INTRAMUSCULAR | Status: DC | PRN
  Administered 2022-07-16: 4 mg via INTRAVENOUS

## 2022-07-16 MED ORDER — TRANEXAMIC ACID-NACL 1000-0.7 MG/100ML-% IV SOLN
1000.0000 mg | INTRAVENOUS | Status: AC
  Administered 2022-07-16: 1000 mg via INTRAVENOUS
  Filled 2022-07-16: qty 100

## 2022-07-16 MED ORDER — FENTANYL CITRATE PF 50 MCG/ML IJ SOSY: 25.0000 ug | PREFILLED_SYRINGE | INTRAMUSCULAR | Status: DC | PRN

## 2022-07-16 MED ORDER — CEFAZOLIN SODIUM-DEXTROSE 2-4 GM/100ML-% IV SOLN
2.0000 g | INTRAVENOUS | Status: AC
  Administered 2022-07-16: 2 g via INTRAVENOUS
  Filled 2022-07-16: qty 100

## 2022-07-16 MED ORDER — DOCUSATE SODIUM 100 MG PO CAPS
100.0000 mg | ORAL_CAPSULE | Freq: Two times a day (BID) | ORAL | Status: DC
  Administered 2022-07-16 – 2022-07-22 (×12): 100 mg via ORAL
  Filled 2022-07-16 (×12): qty 1

## 2022-07-16 MED ORDER — ALBUTEROL SULFATE HFA 108 (90 BASE) MCG/ACT IN AERS
INHALATION_SPRAY | RESPIRATORY_TRACT | Status: AC
  Filled 2022-07-16: qty 6.7

## 2022-07-16 MED ORDER — BUPIVACAINE HCL (PF) 0.5 % IJ SOLN
INTRAMUSCULAR | Status: DC | PRN
  Administered 2022-07-16: 11 mg via INTRATHECAL

## 2022-07-16 MED ORDER — BISACODYL 10 MG RE SUPP: 10.0000 mg | Freq: Every day | RECTAL | Status: DC | PRN

## 2022-07-16 MED ORDER — DEXMEDETOMIDINE HCL IN NACL 80 MCG/20ML IV SOLN
INTRAVENOUS | Status: DC | PRN
  Administered 2022-07-16: 12 ug via BUCCAL

## 2022-07-16 MED ORDER — DEXAMETHASONE SODIUM PHOSPHATE 10 MG/ML IJ SOLN
10.0000 mg | Freq: Once | INTRAMUSCULAR | Status: AC
  Administered 2022-07-17: 10 mg via INTRAVENOUS
  Filled 2022-07-16: qty 1

## 2022-07-16 MED ORDER — POLYETHYLENE GLYCOL 3350 17 G PO PACK
17.0000 g | PACK | Freq: Every day | ORAL | Status: DC
  Administered 2022-07-16 – 2022-07-20 (×4): 17 g via ORAL
  Filled 2022-07-16 (×6): qty 1

## 2022-07-16 MED ORDER — CEFAZOLIN SODIUM-DEXTROSE 2-4 GM/100ML-% IV SOLN
2.0000 g | Freq: Four times a day (QID) | INTRAVENOUS | Status: AC
  Administered 2022-07-16 – 2022-07-17 (×2): 2 g via INTRAVENOUS
  Filled 2022-07-16 (×2): qty 100

## 2022-07-16 MED ORDER — PROPOFOL 500 MG/50ML IV EMUL
INTRAVENOUS | Status: DC | PRN
  Administered 2022-07-16: 40 ug/kg/min via INTRAVENOUS

## 2022-07-16 MED ORDER — ONDANSETRON HCL 4 MG/2ML IJ SOLN: 4.0000 mg | Freq: Once | INTRAMUSCULAR | Status: DC | PRN

## 2022-07-16 MED ORDER — METHOCARBAMOL 500 MG PO TABS
500.0000 mg | ORAL_TABLET | Freq: Four times a day (QID) | ORAL | Status: DC | PRN
  Administered 2022-07-17 – 2022-07-21 (×7): 500 mg via ORAL
  Filled 2022-07-16 (×7): qty 1

## 2022-07-16 MED ORDER — DIPHENHYDRAMINE HCL 12.5 MG/5ML PO ELIX: 12.5000 mg | ORAL_SOLUTION | ORAL | Status: DC | PRN

## 2022-07-16 MED ORDER — DEXMEDETOMIDINE HCL IN NACL 80 MCG/20ML IV SOLN
INTRAVENOUS | Status: AC
  Filled 2022-07-16: qty 20

## 2022-07-16 MED ORDER — ASPIRIN 81 MG PO CHEW
81.0000 mg | CHEWABLE_TABLET | Freq: Two times a day (BID) | ORAL | Status: DC
  Administered 2022-07-16 – 2022-07-22 (×12): 81 mg via ORAL
  Filled 2022-07-16 (×12): qty 1

## 2022-07-16 MED ORDER — ONDANSETRON HCL 4 MG/2ML IJ SOLN: 4.0000 mg | Freq: Four times a day (QID) | INTRAMUSCULAR | Status: DC | PRN

## 2022-07-16 MED ORDER — SODIUM CHLORIDE 0.9 % IV SOLN: INTRAVENOUS | Status: DC

## 2022-07-16 MED ORDER — ONDANSETRON HCL 4 MG PO TABS: 4.0000 mg | ORAL_TABLET | Freq: Four times a day (QID) | ORAL | Status: DC | PRN

## 2022-07-16 MED ORDER — CHLORHEXIDINE GLUCONATE 4 % EX LIQD: 60.0000 mL | Freq: Once | CUTANEOUS | Status: DC

## 2022-07-16 MED ORDER — PHENYLEPHRINE 80 MCG/ML (10ML) SYRINGE FOR IV PUSH (FOR BLOOD PRESSURE SUPPORT)
PREFILLED_SYRINGE | INTRAVENOUS | Status: DC | PRN
  Administered 2022-07-16 (×5): 80 ug via INTRAVENOUS

## 2022-07-16 MED ORDER — GLYCOPYRROLATE 0.2 MG/ML IJ SOLN
INTRAMUSCULAR | Status: DC | PRN
  Administered 2022-07-16: .1 mg via INTRAVENOUS

## 2022-07-16 MED ORDER — MIDAZOLAM HCL 2 MG/2ML IJ SOLN
INTRAMUSCULAR | Status: DC | PRN
  Administered 2022-07-16: 2 mg via INTRAVENOUS

## 2022-07-16 MED ORDER — METOCLOPRAMIDE HCL 5 MG/ML IJ SOLN: 5.0000 mg | Freq: Three times a day (TID) | INTRAMUSCULAR | Status: DC | PRN

## 2022-07-16 SURGICAL SUPPLY — 41 items
ADH SKN CLS APL DERMABOND .7 (GAUZE/BANDAGES/DRESSINGS) ×1
BAG COUNTER SPONGE SURGICOUNT (BAG) IMPLANT
BAG DECANTER FOR FLEXI CONT (MISCELLANEOUS) IMPLANT
BAG SPEC THK2 15X12 ZIP CLS (MISCELLANEOUS)
BAG SPNG CNTER NS LX DISP (BAG)
BAG ZIPLOCK 12X15 (MISCELLANEOUS) IMPLANT
BLADE SAG 18X100X1.27 (BLADE) ×1 IMPLANT
COVER PERINEAL POST (MISCELLANEOUS) ×1 IMPLANT
COVER SURGICAL LIGHT HANDLE (MISCELLANEOUS) ×1 IMPLANT
CUP ACETBLR 52 OD PINNACLE (Hips) IMPLANT
DERMABOND ADVANCED .7 DNX12 (GAUZE/BANDAGES/DRESSINGS) ×1 IMPLANT
DRAPE FOOT SWITCH (DRAPES) ×1 IMPLANT
DRAPE STERI IOBAN 125X83 (DRAPES) ×1 IMPLANT
DRAPE U-SHAPE 47X51 STRL (DRAPES) ×2 IMPLANT
DRESSING AQUACEL AG SP 3.5X10 (GAUZE/BANDAGES/DRESSINGS) ×1 IMPLANT
DRSG AQUACEL AG ADV 3.5X10 (GAUZE/BANDAGES/DRESSINGS) IMPLANT
DRSG AQUACEL AG SP 3.5X10 (GAUZE/BANDAGES/DRESSINGS) ×1
DURAPREP 26ML APPLICATOR (WOUND CARE) ×1 IMPLANT
ELECT REM PT RETURN 15FT ADLT (MISCELLANEOUS) ×1 IMPLANT
GLOVE BIO SURGEON STRL SZ 6 (GLOVE) ×1 IMPLANT
GLOVE BIOGEL PI IND STRL 6.5 (GLOVE) ×1 IMPLANT
GLOVE BIOGEL PI IND STRL 7.5 (GLOVE) ×1 IMPLANT
GLOVE ORTHO TXT STRL SZ7.5 (GLOVE) ×2 IMPLANT
GOWN STRL REUS W/ TWL LRG LVL3 (GOWN DISPOSABLE) ×3 IMPLANT
GOWN STRL REUS W/TWL LRG LVL3 (GOWN DISPOSABLE) ×3
HEAD CERAMIC 36 PLUS5 (Hips) IMPLANT
HOLDER FOLEY CATH W/STRAP (MISCELLANEOUS) ×1 IMPLANT
KIT TURNOVER KIT A (KITS) IMPLANT
LINER NEUTRAL 52X36MM PLUS 4 (Liner) IMPLANT
PACK ANTERIOR HIP CUSTOM (KITS) ×1 IMPLANT
SCREW 6.5MMX30MM (Screw) IMPLANT
STEM FEMORAL SZ6 HIGH ACTIS (Stem) IMPLANT
SUT MNCRL AB 4-0 PS2 18 (SUTURE) ×1 IMPLANT
SUT STRATAFIX 0 PDS 27 VIOLET (SUTURE) ×1
SUT VIC AB 1 CT1 36 (SUTURE) ×3 IMPLANT
SUT VIC AB 2-0 CT1 27 (SUTURE) ×2
SUT VIC AB 2-0 CT1 TAPERPNT 27 (SUTURE) ×2 IMPLANT
SUTURE STRATFX 0 PDS 27 VIOLET (SUTURE) ×1 IMPLANT
TRAY FOLEY MTR SLVR 16FR STAT (SET/KITS/TRAYS/PACK) IMPLANT
TUBE SUCTION HIGH CAP CLEAR NV (SUCTIONS) ×1 IMPLANT
WATER STERILE IRR 1000ML POUR (IV SOLUTION) ×1 IMPLANT

## 2022-07-16 NOTE — Progress Notes (Signed)
Progress Note    Taylor Ochoa   HQI:[id number]  DOB: [birth year]  DOA: 07/14/2022     2 PCP: Janith Lima, MD  Initial CC: fall  Hospital Course: Taylor Ochoa is a 66  yo male with PMH HLD, COPD, depression, GERD who presented after a mechanical fall.  He landed on his right side and was brought to the ER for evaluation. Pelvis imaging revealed acute right femoral neck fracture.  He was evaluated by orthopedic surgery and underwent right total hip replacement on 07/16/2022.  Interval History:  Patient seen this afternoon after returning from surgery.  He was awake and alert resting comfortably in bed with family present bedside.  Assessment and Plan:  Acute right femoral neck fracture - s/p mechanical fall -Underwent right total hip replacement with orthopedic surgery on 07/16/2022 - Follow-up assessment with PT   Leukocytosis: Likely reactive.   Depression: Continue Zoloft.   GERD: Continue Protonix.   Hyperlipidemia: Continue Crestor.  Old records reviewed in assessment of this patient  Antimicrobials:   DVT prophylaxis:  SCDs Start: 07/16/22 1445 Place TED hose Start: 07/16/22 1445 SCDs Start: 07/14/22 2315   Code Status:   Code Status: Full Code  Mobility Assessment (last 72 hours)     Mobility Assessment     Row Name 07/16/22 1501 07/16/22 0731 07/15/22 1923 07/15/22 1530     Does patient have an order for bedrest or is patient medically unstable No - Continue assessment Yes- Bedfast (Level 1) - Complete Yes- Bedfast (Level 1) - Complete No - Continue assessment    What is the highest level of mobility based on the progressive mobility assessment? Level 2 (Chairfast) - Balance while sitting on edge of bed and cannot stand Level 1 (Bedfast) - Unable to balance while sitting on edge of bed Level 1 (Bedfast) - Unable to balance while sitting on edge of bed Level 1 (Bedfast) - Unable to balance while sitting on edge of bed    Is the above level different from  baseline mobility prior to current illness? Yes - Recommend PT order Yes - Recommend PT order Yes - Recommend PT order Yes - Recommend PT order             Barriers to discharge:  Disposition Plan:  pending PT eval Status is: Inpt  Objective: Blood pressure 138/86, pulse (!) 102, temperature 98.1 F (36.7 C), temperature source Oral, resp. rate 18, height '5\' 7"'$  (1.702 m), weight 66.3 kg, SpO2 92 %.  Examination:  Physical Exam Constitutional:      General: He is not in acute distress.    Appearance: Normal appearance.  HENT:     Head: Normocephalic and atraumatic.     Mouth/Throat:     Mouth: Mucous membranes are moist.  Eyes:     Extraocular Movements: Extraocular movements intact.  Cardiovascular:     Rate and Rhythm: Normal rate and regular rhythm.     Heart sounds: Normal heart sounds.  Pulmonary:     Effort: Pulmonary effort is normal. No respiratory distress.     Breath sounds: Normal breath sounds. No wheezing.  Abdominal:     General: Bowel sounds are normal. There is no distension.     Palpations: Abdomen is soft.     Tenderness: There is no abdominal tenderness.  Musculoskeletal:        General: Normal range of motion.     Cervical back: Normal range of motion and neck supple.  Comments: Surgical dressing intact in right hip.  Compartments soft  Skin:    General: Skin is warm and dry.  Neurological:     General: No focal deficit present.     Mental Status: He is alert.  Psychiatric:        Mood and Affect: Mood normal.        Behavior: Behavior normal.     Consultants:  Orthopedic surgery  Procedures:  07/16/2022: Right total hip replacement  Data Reviewed: Results for orders placed or performed during the hospital encounter of 07/14/22 (from the past 24 hour(s))  CBC with Differential/Platelet     Status: Abnormal   Collection Time: 07/16/22  3:25 AM  Result Value Ref Range   WBC 11.9 (H) 4.0 - 10.5 K/uL   RBC 4.30 4.22 - 5.81 MIL/uL    Hemoglobin 14.1 13.0 - 17.0 g/dL   HCT 41.9 39.0 - 52.0 %   MCV 97.4 80.0 - 100.0 fL   MCH 32.8 26.0 - 34.0 pg   MCHC 33.7 30.0 - 36.0 g/dL   RDW 14.2 11.5 - 15.5 %   Platelets 193 150 - 400 K/uL   nRBC 0.0 0.0 - 0.2 %   Neutrophils Relative % 86 %   Neutro Abs 10.1 (H) 1.7 - 7.7 K/uL   Lymphocytes Relative 7 %   Lymphs Abs 0.9 0.7 - 4.0 K/uL   Monocytes Relative 7 %   Monocytes Absolute 0.9 0.1 - 1.0 K/uL   Eosinophils Relative 0 %   Eosinophils Absolute 0.0 0.0 - 0.5 K/uL   Basophils Relative 0 %   Basophils Absolute 0.0 0.0 - 0.1 K/uL   Immature Granulocytes 0 %   Abs Immature Granulocytes 0.05 0.00 - 0.07 K/uL  Type and screen [place]     Status: None   Collection Time: 07/16/22  3:17 PM  Result Value Ref Range   ABO/RH(D) O POS    Antibody Screen NEG    Sample Expiration      07/19/2022,2359 Performed at [facility] [address] [place], [place] [address]     I have Reviewed nursing notes, Vitals, and Lab results since pt's last encounter. Pertinent lab results : see above I have ordered test including BMP, CBC, Mg I have reviewed the last note from staff over past 24 hours I have discussed pt's care plan and test results with nursing staff, case manager   LOS: 2 days   Dwyane Dee, MD [facility] 07/16/2022, 5:07 PM

## 2022-07-16 NOTE — Op Note (Addendum)
NAME:  Taylor Ochoa NO.: [id number]      MEDICAL RECORD NO.: [id number]      FACILITY:  [facility]      PHYSICIAN:  Taylor Ochoa  DATE OF BIRTH:  29-Nov-1955     DATE OF PROCEDURE:  07/16/2022                                 OPERATIVE REPORT         PREOPERATIVE DIAGNOSIS: Right  hip femoral neck fracture     POSTOPERATIVE DIAGNOSIS:  Right hip femoral neck fracture.      PROCEDURE:  Right total hip replacement through an anterior approach   utilizing DePuy THR system, component size 52 mm pinnacle cup, a size 36+4 neutral   Altrex liner, a size 6 Hi Actis stem with a 36+5 delta ceramic   ball.      SURGEON:  Pietro Cassis. Alvan Dame, M.D.      ASSISTANT:  Costella Hatcher, PA-C     ANESTHESIA:  Spinal.      SPECIMENS:  None.      COMPLICATIONS:  None.      BLOOD LOSS:  300 cc     DRAINS:  None.      INDICATION OF THE PROCEDURE:  Taylor Ochoa is a 66 y.o. male who presented to the ER after a ground level fall at a grocery store.  He had immediate onset of pain and inability to bear weight. He was brought to the ER where radiographs revealed a displaced femoral neck fracture.  We reviewed treatment options, the pros and cons of the options.  I felt that the best form of treatment was to perform a total hip replacement.    Consent was obtained for   benefit of pain relief.  Specific risks of infection, DVT, component   failure, dislocation, neurovascular injury, and need for revision surgery were reviewed.     PROCEDURE IN DETAIL:  The patient was brought to operative theater.   Once adequate anesthesia, preoperative antibiotics, 2 gm of Ancef, 1 gm of Tranexamic Acid, and 10 mg of Decadron were administered, the patient was positioned supine on the [employer] table.  Once the patient was safely positioned with adequate padding of boney prominences we predraped out the hip, and used fluoroscopy to confirm orientation of the pelvis.      The  right hip was then prepped and draped from proximal iliac crest to   mid thigh with a shower curtain technique.      Time-out was performed identifying the patient, planned procedure, and the appropriate extremity.     An incision was then made 2 cm lateral to the   anterior superior iliac spine extending over the orientation of the   tensor fascia lata muscle and sharp dissection was carried down to the   fascia of the muscle.      The fascia was then incised.  The muscle belly was identified and swept   laterally and retractor placed along the superior neck.  Following   cauterization of the circumflex vessels and removing some pericapsular   fat, a second cobra retractor was placed on the inferior neck.  A T-capsulotomy was made along the line of the   superior neck to the trochanteric fossa, then extended  proximally and   distally.  Tag sutures were placed and the retractors were then placed   intracapsular.  We then identified the trochanteric fossa and   orientation of my neck cut.  I made a distal neck cut and removed the fractured femoral neck segment and the femoral head without issue with the femur on traction. Traction was let   off and retractors were placed posterior and anterior around the   acetabulum.      The labrum and foveal tissue were debrided.  I began reaming with a 47 mm   reamer and reamed up to 51 mm reamer with good bony bed preparation and a 52 mm  cup was chosen.  The final 52 mm Pinnacle cup was then impacted under fluoroscopy to confirm the depth of penetration and orientation with respect to   Abduction and forward flexion.  A screw was placed into the ilium followed by the hole eliminator.  The final   36+4 neutral Altrex liner was impacted with good visualized rim fit.  The cup was positioned anatomically within the acetabular portion of the pelvis.      At this point, the femur was rolled to 100 degrees.  Further capsule was   released off the inferior  aspect of the femoral neck.  I then   released the superior capsule proximally.  With the leg in a neutral position the hook was placed laterally   along the femur under the vastus lateralis origin and elevated manually and then held in position using the hook attachment on the bed.  The leg was then extended and adducted with the leg rolled to 100   degrees of external rotation.  Retractors were placed along the medial calcar and posteriorly over the greater trochanter.  Once the proximal femur was fully   exposed, I used a box osteotome to set orientation.  I then began   broaching with the starting chili pepper broach and passed this by hand and then broached up to 6.  With the 6 broach in place I chose a high offset neck and did several trial reductions.  The offset was appropriate, leg lengths   appeared to be equal best matched with the +5 versus the +1.5 head ball trial confirmed radiographically.   Given these findings, I went ahead and dislocated the hip, repositioned all   retractors and positioned the right hip in the extended and abducted position.  The final 6 Hi Actis stem was   chosen and it was impacted down to the level of neck cut.  Based on this   and the trial reductions, a final 36+5 delta ceramic ball was chosen and   impacted onto a clean and dry trunnion, and the hip was reduced.  The   hip had been irrigated throughout the case again at this point.  I did   reapproximate the superior capsular leaflet to the anterior leaflet   using #1 Vicryl.  The fascia of the   tensor fascia lata muscle was then reapproximated using #1 Vicryl and #0 Stratafix sutures.  The   remaining wound was closed with 2-0 Vicryl and running 4-0 Monocryl.   The hip was cleaned, dried, and dressed sterilely using Dermabond and   Aquacel dressing.  The patient was then brought   to recovery room in stable condition tolerating the procedure well.    Costella Hatcher, PA-C was present for the entirety  of the case involved from   preoperative positioning, perioperative  retractor management, general   facilitation of the case, as well as primary wound closure as assistant.            Pietro Cassis Alvan Dame, M.D.        07/16/2022 1:21 PM

## 2022-07-16 NOTE — Interval H&P Note (Signed)
History and Physical Interval Note:  07/16/2022 10:25 AM  Taylor Ochoa  has presented today for surgery, with the diagnosis of RIGHT FEMORAL NECK FRACTURE.  The various methods of treatment have been discussed with the patient and family. After consideration of risks, benefits and other options for treatment, the patient has consented to  Procedure(s): TOTAL HIP ARTHROPLASTY ANTERIOR APPROACH (Right) as a surgical intervention.  The patient's history has been reviewed, patient examined, no change in status, stable for surgery.  I have reviewed the patient's chart and labs.  Questions were answered to the patient's satisfaction.     Mauri Pole

## 2022-07-16 NOTE — Progress Notes (Signed)
Patient ID: Taylor Ochoa, male   DOB: 14-May-1956, 66 y.o.   MRN: [id number]  Patient is relatively comfortable noting some back pain.  No other significant complaints after the fall during which she sustained a displaced right femoral neck fracture.  Hemoglobin is 14.1 on date of procedure  Assessment right displaced femoral neck fracture following a fall  Plan: Today the patient will go to the operating room for a right total hip arthroplasty.  Risks of infection DVT dislocation neurovascular injury and need for future surgeries were discussed and reviewed.  Consent was obtained for management of his hip fracture pain only. He will likely be in the hospital for 1 to 2 days postoperatively to work on his mobilization and safe gait training to try to go home. We will use aspirin for DVT prophylaxis.

## 2022-07-16 NOTE — Anesthesia Postprocedure Evaluation (Signed)
Anesthesia Post Note  Patient: Taylor Ochoa  Procedure(s) Performed: TOTAL HIP ARTHROPLASTY ANTERIOR APPROACH (Right: Hip)     Patient location during evaluation: Nursing Unit Anesthesia Type: Spinal Level of consciousness: oriented and awake and alert Pain management: pain level controlled Vital Signs Assessment: post-procedure vital signs reviewed and stable Respiratory status: spontaneous breathing and respiratory function stable Cardiovascular status: blood pressure returned to baseline and stable Postop Assessment: no headache, no backache, no apparent nausea or vomiting and patient able to bend at knees Anesthetic complications: no   No notable events documented.  Last Vitals:  Vitals:   07/16/22 1430 07/16/22 1450  BP: 122/78 138/86  Pulse: 95 (!) 102  Resp: 19 18  Temp: 36.4 C 36.7 C  SpO2: 94% 92%    Last Pain:  Vitals:   07/16/22 1450  TempSrc: Oral  PainSc:                  Barnet Glasgow

## 2022-07-16 NOTE — Discharge Instructions (Signed)

## 2022-07-16 NOTE — Anesthesia Procedure Notes (Signed)
Spinal  Patient location during procedure: OR Start time: 07/16/2022 12:00 PM End time: 07/16/2022 12:05 PM Reason for block: surgical anesthesia Staffing Performed: anesthesiologist  Anesthesiologist: Barnet Glasgow, MD Performed by: Barnet Glasgow, MD Authorized by: Barnet Glasgow, MD   Preanesthetic Checklist Completed: patient identified, IV checked, risks and benefits discussed, surgical consent, monitors and equipment checked, pre-op evaluation and timeout performed Spinal Block Patient position: left lateral decubitus Prep: DuraPrep and site prepped and draped Patient monitoring: heart rate, cardiac monitor, continuous pulse ox and blood pressure Approach: midline Location: L3-4 Injection technique: single-shot Needle Needle type: Pencan  Needle gauge: 24 G Needle length: 10 cm Needle insertion depth: 4 cm Assessment Sensory level: T4 Events: CSF return Additional Notes  1 Attempt (s). Pt tolerated procedure well.

## 2022-07-16 NOTE — Transfer of Care (Signed)
Immediate Anesthesia Transfer of Care Note  Patient: Taylor Ochoa  Procedure(s) Performed: Procedure(s): TOTAL HIP ARTHROPLASTY ANTERIOR APPROACH (Right)  Patient Location: PACU  Anesthesia Type:Spinal  Level of Consciousness: awake, alert  and oriented  Airway & Oxygen Therapy: Patient Spontanous Breathing  Post-op Assessment: Report given to RN and Post -op Vital signs reviewed and stable  Post vital signs: Reviewed and stable  Last Vitals:  Vitals:   07/16/22 0628 07/16/22 0945  BP: (!) 153/77 135/72  Pulse: 75 72  Resp:  18  Temp: (!) 36.4 C 37.7 C  SpO2: 02% 11%    Complications: No apparent anesthesia complications

## 2022-07-16 NOTE — Plan of Care (Signed)
  Problem: Pain Managment: Goal: General experience of comfort will improve Outcome: Progressing   Problem: Safety: Goal: Ability to remain free from injury will improve Outcome: Progressing   

## 2022-07-17 ENCOUNTER — Encounter (HOSPITAL_COMMUNITY): Payer: Self-pay | Admitting: Orthopedic Surgery

## 2022-07-17 ENCOUNTER — Inpatient Hospital Stay (HOSPITAL_COMMUNITY): Payer: Medicare Other

## 2022-07-17 DIAGNOSIS — R410 Disorientation, unspecified: Secondary | ICD-10-CM

## 2022-07-17 DIAGNOSIS — S72001A Fracture of unspecified part of neck of right femur, initial encounter for closed fracture: Secondary | ICD-10-CM | POA: Diagnosis not present

## 2022-07-17 LAB — CBC WITH DIFFERENTIAL/PLATELET
Abs Immature Granulocytes: 0.04 10*3/uL (ref 0.00–0.07)
Basophils Absolute: 0 10*3/uL (ref 0.0–0.1)
Basophils Relative: 0 %
Eosinophils Absolute: 0 10*3/uL (ref 0.0–0.5)
Eosinophils Relative: 0 %
HCT: 34.6 % — ABNORMAL LOW (ref 39.0–52.0)
Hemoglobin: 11.8 g/dL — ABNORMAL LOW (ref 13.0–17.0)
Immature Granulocytes: 0 %
Lymphocytes Relative: 6 %
Lymphs Abs: 0.7 10*3/uL (ref 0.7–4.0)
MCH: 33.1 pg (ref 26.0–34.0)
MCHC: 34.1 g/dL (ref 30.0–36.0)
MCV: 96.9 fL (ref 80.0–100.0)
Monocytes Absolute: 1.1 10*3/uL — ABNORMAL HIGH (ref 0.1–1.0)
Monocytes Relative: 9 %
Neutro Abs: 10.6 10*3/uL — ABNORMAL HIGH (ref 1.7–7.7)
Neutrophils Relative %: 85 %
Platelets: 185 10*3/uL (ref 150–400)
RBC: 3.57 MIL/uL — ABNORMAL LOW (ref 4.22–5.81)
RDW: 13.8 % (ref 11.5–15.5)
WBC: 12.5 10*3/uL — ABNORMAL HIGH (ref 4.0–10.5)
nRBC: 0 % (ref 0.0–0.2)

## 2022-07-17 LAB — BASIC METABOLIC PANEL
Anion gap: 9 (ref 5–15)
BUN: 24 mg/dL — ABNORMAL HIGH (ref 8–23)
CO2: 24 mmol/L (ref 22–32)
Calcium: 8.3 mg/dL — ABNORMAL LOW (ref 8.9–10.3)
Chloride: 104 mmol/L (ref 98–111)
Creatinine, Ser: 0.78 mg/dL (ref 0.61–1.24)
GFR, Estimated: >60 mL/min (ref >60)
Glucose, Bld: 139 mg/dL — ABNORMAL HIGH (ref 70–99)
Potassium: 3.6 mmol/L (ref 3.5–5.1)
Sodium: 137 mmol/L (ref 135–145)

## 2022-07-17 LAB — TROPONIN I (HIGH SENSITIVITY): Troponin I (High Sensitivity): 12 ng/L (ref <18)

## 2022-07-17 LAB — MAGNESIUM: Magnesium: 1.9 mg/dL (ref 1.7–2.4)

## 2022-07-17 MED ORDER — PANTOPRAZOLE SODIUM 40 MG PO TBEC
40.0000 mg | DELAYED_RELEASE_TABLET | Freq: Every day | ORAL | Status: DC
  Administered 2022-07-18 – 2022-07-22 (×5): 40 mg via ORAL
  Filled 2022-07-17 (×5): qty 1

## 2022-07-17 MED ORDER — HYDROCODONE-ACETAMINOPHEN 5-325 MG PO TABS
1.0000 | ORAL_TABLET | ORAL | Status: DC | PRN
  Administered 2022-07-18 – 2022-07-22 (×17): 1 via ORAL
  Filled 2022-07-17 (×17): qty 1

## 2022-07-17 MED ORDER — METOPROLOL TARTRATE 5 MG/5ML IV SOLN
5.0000 mg | INTRAVENOUS | Status: DC | PRN
  Administered 2022-07-17: 5 mg via INTRAVENOUS
  Filled 2022-07-17: qty 5

## 2022-07-17 MED ORDER — METOPROLOL TARTRATE 25 MG PO TABS
25.0000 mg | ORAL_TABLET | Freq: Two times a day (BID) | ORAL | Status: DC
  Administered 2022-07-17 – 2022-07-18 (×2): 25 mg via ORAL
  Filled 2022-07-17 (×2): qty 1

## 2022-07-17 NOTE — Progress Notes (Signed)
RN called to room. Patient heart rate in 160's, patient co of pain, needing to have BM and increased confusion. EKG taken, states a fib RVR and MD made aware. Orders for  patient to transfer and lopressor. VSS. Meds given.
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
## 2022-07-17 NOTE — Progress Notes (Signed)
PT Cancellation Note  Patient Details Name: Taylor Ochoa MRN: [id number] DOB: 1956-09-14   Cancelled Treatment:    Reason Eval/Treat Not Completed: Medical issues which prohibited therapy. Pt in afib with HR 164. Defer PT Eval at this time.   [facility] 07/17/2022, 5:19 PM

## 2022-07-17 NOTE — Progress Notes (Signed)
Progress Note    Taylor Ochoa   WSF:[id number]  DOB: August 20, 1956  DOA: 07/14/2022     3 PCP: Taylor Lima, MD  Initial CC: fall  Hospital Course: Taylor Ochoa is a 66  yo male with PMH HLD, COPD, depression, GERD who presented after a mechanical fall.  He landed on his right side and was brought to the ER for evaluation. Pelvis imaging revealed acute right femoral neck fracture.  He was evaluated by orthopedic surgery and underwent right total hip replacement on 07/16/2022.  Interval History:  No events overnight.  He was noted to have delirium this morning.  Able to follow commands but was not oriented other than to name.  Assessment and Plan:  Acute right femoral neck fracture - s/p mechanical fall -Underwent right total hip replacement with orthopedic surgery on 07/16/2022 - Follow-up assessment with PT  Acute delirium - Not unexpected given age, surgery, and hospitalization - Continue delirium precautions   Leukocytosis: Likely reactive   Depression: Continue Zoloft   GERD: Continue Protonix   Hyperlipidemia: Continue Crestor.  Old records reviewed in assessment of this patient  Antimicrobials:   DVT prophylaxis:  SCDs Start: 07/16/22 1445 Place TED hose Start: 07/16/22 1445 SCDs Start: 07/14/22 2315   Code Status:   Code Status: Full Code  Mobility Assessment (last 72 hours)     Mobility Assessment     Row Name 07/16/22 2012 07/16/22 1501 07/16/22 0731 07/15/22 1923 07/15/22 1530   Does patient have an order for bedrest or is patient medically unstable No - Continue assessment No - Continue assessment Yes- Bedfast (Level 1) - Complete Yes- Bedfast (Level 1) - Complete No - Continue assessment   What is the highest level of mobility based on the progressive mobility assessment? Level 2 (Chairfast) - Balance while sitting on edge of bed and cannot stand Level 2 (Chairfast) - Balance while sitting on edge of bed and cannot stand Level 1 (Bedfast) - Unable  to balance while sitting on edge of bed Level 1 (Bedfast) - Unable to balance while sitting on edge of bed Level 1 (Bedfast) - Unable to balance while sitting on edge of bed   Is the above level different from baseline mobility prior to current illness? Yes - Recommend PT order Yes - Recommend PT order Yes - Recommend PT order Yes - Recommend PT order Yes - Recommend PT order            Barriers to discharge:  Disposition Plan:  pending PT eval Status is: Inpt  Objective: Blood pressure 137/76, pulse 92, temperature 99.3 F (37.4 C), temperature source Oral, resp. rate 15, height '5\' 7"'$  (1.702 m), weight 79.7 kg, SpO2 93 %.  Examination:  Physical Exam Constitutional:      General: He is not in acute distress.    Appearance: Normal appearance.  HENT:     Head: Normocephalic and atraumatic.     Mouth/Throat:     Mouth: Mucous membranes are moist.  Eyes:     Extraocular Movements: Extraocular movements intact.  Cardiovascular:     Rate and Rhythm: Normal rate and regular rhythm.     Heart sounds: Normal heart sounds.  Pulmonary:     Effort: Pulmonary effort is normal. No respiratory distress.     Breath sounds: Normal breath sounds. No wheezing.  Abdominal:     General: Bowel sounds are normal. There is no distension.     Palpations: Abdomen is soft.  Tenderness: There is no abdominal tenderness.  Musculoskeletal:        General: Normal range of motion.     Cervical back: Normal range of motion and neck supple.     Comments: Surgical dressing intact in right hip.  Compartments soft  Skin:    General: Skin is warm and dry.  Neurological:     General: No focal deficit present.     Mental Status: He is alert. He is disoriented.     Comments: Oriented to name only  Psychiatric:        Mood and Affect: Mood normal.        Behavior: Behavior normal.     Consultants:  Orthopedic surgery  Procedures:  07/16/2022: Right total hip replacement  Data Reviewed: Results  for orders placed or performed during the hospital encounter of 07/14/22 (from the past 24 hour(s))  Type and screen [place]     Status: None   Collection Time: 07/16/22  3:17 PM  Result Value Ref Range   ABO/RH(D) O POS    Antibody Screen NEG    Sample Expiration      07/19/2022,2359 Performed at [facility], [place] [address], [place], [place] [address]   Basic metabolic panel     Status: Abnormal   Collection Time: 07/17/22  3:15 AM  Result Value Ref Range   Sodium 137 135 - 145 mmol/L   Potassium 3.6 3.5 - 5.1 mmol/L   Chloride 104 98 - 111 mmol/L   CO2 24 22 - 32 mmol/L   Glucose, Bld 139 (H) 70 - 99 mg/dL   BUN 24 (H) 8 - 23 mg/dL   Creatinine, Ser 0.78 0.61 - 1.24 mg/dL   Calcium 8.3 (L) 8.9 - 10.3 mg/dL   GFR, Estimated >60 >60 mL/min   Anion gap 9 5 - 15  CBC with Differential/Platelet     Status: Abnormal   Collection Time: 07/17/22  3:15 AM  Result Value Ref Range   WBC 12.5 (H) 4.0 - 10.5 K/uL   RBC 3.57 (L) 4.22 - 5.81 MIL/uL   Hemoglobin 11.8 (L) 13.0 - 17.0 g/dL   HCT 34.6 (L) 39.0 - 52.0 %   MCV 96.9 80.0 - 100.0 fL   MCH 33.1 26.0 - 34.0 pg   MCHC 34.1 30.0 - 36.0 g/dL   RDW 13.8 11.5 - 15.5 %   Platelets 185 150 - 400 K/uL   nRBC 0.0 0.0 - 0.2 %   Neutrophils Relative % 85 %   Neutro Abs 10.6 (H) 1.7 - 7.7 K/uL   Lymphocytes Relative 6 %   Lymphs Abs 0.7 0.7 - 4.0 K/uL   Monocytes Relative 9 %   Monocytes Absolute 1.1 (H) 0.1 - 1.0 K/uL   Eosinophils Relative 0 %   Eosinophils Absolute 0.0 0.0 - 0.5 K/uL   Basophils Relative 0 %   Basophils Absolute 0.0 0.0 - 0.1 K/uL   Immature Granulocytes 0 %   Abs Immature Granulocytes 0.04 0.00 - 0.07 K/uL  Magnesium     Status: None   Collection Time: 07/17/22  3:15 AM  Result Value Ref Range   Magnesium 1.9 1.7 - 2.4 mg/dL    I have Reviewed nursing notes, Vitals, and Lab results since pt's last encounter. Pertinent lab results : see above I have ordered test  including BMP, CBC, Mg I have reviewed the last note from staff over past 24 hours I have discussed pt's care plan  and test results with nursing staff, case manager   LOS: 3 days   Dwyane Dee, MD [facility] 07/17/2022, 3:02 PM

## 2022-07-18 DIAGNOSIS — I480 Paroxysmal atrial fibrillation: Secondary | ICD-10-CM | POA: Diagnosis not present

## 2022-07-18 DIAGNOSIS — S72001A Fracture of unspecified part of neck of right femur, initial encounter for closed fracture: Secondary | ICD-10-CM | POA: Diagnosis not present

## 2022-07-18 LAB — CBC WITH DIFFERENTIAL/PLATELET
Abs Immature Granulocytes: 0.03 10*3/uL (ref 0.00–0.07)
Basophils Absolute: 0 10*3/uL (ref 0.0–0.1)
Basophils Relative: 0 %
Eosinophils Absolute: 0 10*3/uL (ref 0.0–0.5)
Eosinophils Relative: 0 %
HCT: 33.5 % — ABNORMAL LOW (ref 39.0–52.0)
Hemoglobin: 11 g/dL — ABNORMAL LOW (ref 13.0–17.0)
Immature Granulocytes: 0 %
Lymphocytes Relative: 11 %
Lymphs Abs: 1.1 10*3/uL (ref 0.7–4.0)
MCH: 32.7 pg (ref 26.0–34.0)
MCHC: 32.8 g/dL (ref 30.0–36.0)
MCV: 99.7 fL (ref 80.0–100.0)
Monocytes Absolute: 1.1 10*3/uL — ABNORMAL HIGH (ref 0.1–1.0)
Monocytes Relative: 10 %
Neutro Abs: 8.5 10*3/uL — ABNORMAL HIGH (ref 1.7–7.7)
Neutrophils Relative %: 79 %
Platelets: 186 10*3/uL (ref 150–400)
RBC: 3.36 MIL/uL — ABNORMAL LOW (ref 4.22–5.81)
RDW: 14.1 % (ref 11.5–15.5)
WBC: 10.7 10*3/uL — ABNORMAL HIGH (ref 4.0–10.5)
nRBC: 0 % (ref 0.0–0.2)

## 2022-07-18 LAB — BASIC METABOLIC PANEL
Anion gap: 7 (ref 5–15)
BUN: 29 mg/dL — ABNORMAL HIGH (ref 8–23)
CO2: 27 mmol/L (ref 22–32)
Calcium: 8.4 mg/dL — ABNORMAL LOW (ref 8.9–10.3)
Chloride: 106 mmol/L (ref 98–111)
Creatinine, Ser: 0.79 mg/dL (ref 0.61–1.24)
GFR, Estimated: >60 mL/min (ref >60)
Glucose, Bld: 144 mg/dL — ABNORMAL HIGH (ref 70–99)
Potassium: 3.6 mmol/L (ref 3.5–5.1)
Sodium: 140 mmol/L (ref 135–145)

## 2022-07-18 LAB — MAGNESIUM: Magnesium: 2.2 mg/dL (ref 1.7–2.4)
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
## 2022-07-18 NOTE — Progress Notes (Signed)
Physical Therapy Treatment Patient Details Name: Taylor Ochoa MRN: [id number] DOB: Jan 26, 1956 Today's Date: 07/18/2022   History of Present Illness Pt s/p fall and now s/p R THR by anterior direct approach.  Pt with hx of COPD    PT Comments    Pt continues very cooperative and progressing well with mobility.  Pt up to ambulate increased distance in hall and with noted improvement in stability from am session.   Recommendations for follow up therapy are one component of a multi-disciplinary discharge planning process, led by the attending physician.  Recommendations may be updated based on patient status, additional functional criteria and insurance authorization.  Follow Up Recommendations  Skilled nursing-short term rehab (<3 hours/day) Can patient physically be transported by private vehicle: Yes   Assistance Recommended at Discharge Frequent or constant Supervision/Assistance  Patient can return home with the following A little help with walking and/or transfers;A little help with bathing/dressing/bathroom;Assistance with cooking/housework;Assist for transportation;Help with stairs or ramp for entrance   Equipment Recommendations  None recommended by PT    Recommendations for Other Services       Precautions / Restrictions Precautions Precautions: Fall Restrictions Weight Bearing Restrictions: No RLE Weight Bearing: Weight bearing as tolerated     Mobility  Bed Mobility Overal bed mobility: Needs Assistance Bed Mobility: Sit to Supine     Supine to sit: Min assist, Mod assist Sit to supine: Mod assist   General bed mobility comments: increased time with cues for sequence and assist to bring bil LEs onto bed    Transfers Overall transfer level: Needs assistance Equipment used: Rolling walker (2 wheels) Transfers: Sit to/from Stand Sit to Stand: Min assist           General transfer comment: cues for LE management and use of UEs to self assist     Ambulation/Gait Ambulation/Gait assistance: Min assist Gait Distance (Feet): 48 Feet (48' twice) Assistive device: Rolling walker (2 wheels) Gait Pattern/deviations: Step-to pattern, Decreased step length - right, Decreased step length - left, Shuffle, Trunk flexed Gait velocity: decr     General Gait Details: cues for sequence, posture and position from [employer]             Wheelchair Mobility    Modified Rankin (Stroke Patients Only)       Balance Overall balance assessment: Needs assistance Sitting-balance support: No upper extremity supported, Feet supported Sitting balance-Leahy Scale: Good     Standing balance support: Single extremity supported Standing balance-Leahy Scale: Poor                              Cognition Arousal/Alertness: Awake/alert Behavior During Therapy: WFL for tasks assessed/performed, Impulsive Overall Cognitive Status: Within Functional Limits for tasks assessed                                          Exercises Total Joint Exercises Ankle Circles/Pumps: AROM, Both, 15 reps, Supine Quad Sets: AROM, Both, 10 reps, Supine Heel Slides: AAROM, 20 reps, Supine, Right Hip ABduction/ADduction: AAROM, Right, 15 reps, Supine    General Comments        Pertinent Vitals/Pain Pain Assessment Pain Assessment: 0-10 Pain Score: 5  Faces Pain Scale: Hurts a little bit Pain Location: R hip Pain Descriptors / Indicators: Aching, Sore Pain Intervention(s): Limited activity within patient's  tolerance, Monitored during session, Patient requesting pain meds-RN notified, Ice applied    Home Living Family/patient expects to be discharged to:: Skilled nursing facility                        Prior Function            PT Goals (current goals can now be found in the care plan section) Acute Rehab PT Goals Patient Stated Goal: Regain IND PT Goal Formulation: With patient Time For Goal Achievement:  07/25/22 Potential to Achieve Goals: Good Progress towards PT goals: Progressing toward goals    Frequency    7X/week      PT Plan Current plan remains appropriate    Co-evaluation              AM-PAC PT "6 Clicks" Mobility   Outcome Measure  Help needed turning from your back to your side while in a flat bed without using bedrails?: A Lot Help needed moving from lying on your back to sitting on the side of a flat bed without using bedrails?: A Lot Help needed moving to and from a bed to a chair (including a wheelchair)?: A Lot Help needed standing up from a chair using your arms (e.g., wheelchair or bedside chair)?: A Little Help needed to walk in hospital room?: A Little Help needed climbing 3-5 steps with a railing? : A Lot 6 Click Score: 14    End of Session Equipment Utilized During Treatment: Gait belt Activity Tolerance: Patient tolerated treatment well Patient left: in bed;with call bell/phone within reach;with bed alarm set;with family/visitor present Nurse Communication: Mobility status PT Visit Diagnosis: Unsteadiness on feet (R26.81);Difficulty in walking, not elsewhere classified (R26.2)     Time: 1601-0932 PT Time Calculation (min) (ACUTE ONLY): 24 min  Charges:  $Gait Training: 23-37 mins $Therapeutic Exercise: 8-22 mins                     [place] Pager [phone] Office [phone]    Reco Shonk 07/18/2022, 3:32 PM

## 2022-07-18 NOTE — Care Management Important Message (Signed)
Important Message  Patient Details IM Letter given to the Patient. Name: Taylor Ochoa MRN: [id number] Date of Birth: 1955-10-22   Medicare Important Message Given:  Yes     Kerin Salen 07/18/2022, 11:47 AM

## 2022-07-18 NOTE — TOC Initial Note (Signed)
Transition of Care [facility]) - Initial/Assessment Note    Patient Details  Name: Taylor Ochoa MRN: [id number] Date of Birth: 08-08-1956  [facility] of [facility]) CM/SW Contact:    Lennart Pall, LCSW Phone Number: 07/18/2022, 3:59 PM  Clinical Narrative:                 Met with pt and wife today to review dc planning needs.  Both very pleasant and wife is able to assist, however, she is working full time.  They both feel that SNF rehab will be needed initially and this has been recommended by PT today.  Will begin SNF bed search.  Expected Discharge Plan: Skilled Nursing Facility Barriers to Discharge: Continued Medical Work up, SNF Pending bed offer   Patient Goals and CMS Choice Patient states their goals for this hospitalization and ongoing recovery are:: return home following SNF rehab CMS Medicare.gov Compare Post Acute Care list provided to:: Patient Choice offered to / list presented to : Patient  Expected Discharge Plan and Services Expected Discharge Plan: [place] In-house Referral: Clinical Social Work   Post Acute Care Choice: [place] Living arrangements for the past 2 months: [place]                 DME Arranged: N/A DME Agency: NA                  Prior Living Arrangements/Services Living arrangements for the past 2 months: Single Family Home Lives with:: Spouse Patient language and need for interpreter reviewed:: Yes Do you feel safe going back to the place where you live?: Yes      Need for Family Participation in Patient Care: Yes (Comment) Care giver support system in place?: Yes (comment)   Criminal Activity/Legal Involvement Pertinent to Current Situation/Hospitalization: No - Comment as needed  Activities of Daily Living Home Assistive Devices/Equipment: Cane (specify quad or straight) (straight) ADL Screening (condition at time of admission) Patient's cognitive ability adequate to safely complete  daily activities?: Yes Is the patient deaf or have difficulty hearing?: No Does the patient have difficulty seeing, even when wearing glasses/contacts?: No Does the patient have difficulty concentrating, remembering, or making decisions?: No Patient able to express need for assistance with ADLs?: Yes Does the patient have difficulty dressing or bathing?: No Independently performs ADLs?: Yes (appropriate for developmental age) Does the patient have difficulty walking or climbing stairs?: No Weakness of Legs: Right Weakness of Arms/Hands: None  Permission Sought/Granted Permission sought to share information with : Family Supports Permission granted to share information with : Yes, Verbal Permission Granted  Share Information with NAME: Taylor Ochoa     Permission granted to share info w Relationship: spouse  Permission granted to share info w Contact Information: [phone]  Emotional Assessment Appearance:: Appears stated age Attitude/Demeanor/Rapport: Gracious Affect (typically observed): Accepting Orientation: : Oriented to Self, Oriented to Place, Oriented to Situation, Oriented to  Time Alcohol / Substance Use: Not Applicable Psych Involvement: No (comment)  Admission diagnosis:  Closed right hip fracture (HCC) [S72.001A] Closed fracture of neck of right femur, initial encounter ([place]) [S72.001A] Fall, initial encounter [W19.XXXA] Patient Active Problem List   Diagnosis Date Noted   S/P total right hip arthroplasty 07/16/2022   Fall 07/15/2022   COPD (chronic obstructive pulmonary disease) ([place]) 07/15/2022   Closed fracture of neck of right femur ([place]) 07/14/2022   Need for prophylactic vaccination with combined vaccine 01/23/2022   Screen for colon cancer  01/23/2022   BPH with obstruction/lower urinary tract symptoms 07/26/2021   Cerebral ventriculomegaly 02/29/2020   Mucopurulent chronic bronchitis ([place]) 10/12/2019   Prediabetes 10/12/2019   Routine general medical  examination at a health care facility 04/08/2019   Panlobular emphysema ([place]) 04/08/2019   Hyperlipidemia with target LDL less than 130 04/08/2019   Long toenail 04/08/2019   GAD (generalized anxiety disorder) 04/08/2019   Iliac dissection ([place]) 02/18/2018   Leukocytosis    GERD (gastroesophageal reflux disease) 02/22/2014   Tobacco abuse 02/22/2014   Depression 04/20/2010   PCP:  Janith Lima, MD Pharmacy:   [facility] [address] [place] [address] Phone: [phone] Fax: [place] [address] Phone: [phone] Fax: [phone]  [id number] [place] [address]: [phone]: [phone] [place] [place]: [phone]: [phone]    Social Determinants of Health (SDOH) Interventions Food Insecurity Interventions: Intervention Not Indicated Housing Interventions: Intervention Not Indicated Transportation Interventions: Intervention Not Indicated Utilities Interventions: Intervention Not Indicated  Readmission Risk Interventions    07/18/2022    3:57 PM  Readmission Risk Prevention Plan  Post Dischage Appt Complete  Medication Screening Complete  Transportation Screening Complete

## 2022-07-18 NOTE — Progress Notes (Signed)
Progress Note    Taylor Ochoa   [name]:[id number]  DOB: June 20, 1956  DOA: 07/14/2022     4 PCP: Janith Lima, MD  Initial CC: fall  Hospital Course: [name] is a 66  yo male with PMH HLD, COPD, depression, GERD who presented after a mechanical fall.  He landed on his right side and was brought to the ER for evaluation. Pelvis imaging revealed acute right femoral neck fracture.  He was evaluated by orthopedic surgery and underwent right total hip replacement on 07/16/2022.  Interval History:  No events overnight.  Brief episode of afib with RVR on 10/25, he converted back to NSR within an hour or 2.   Assessment and Plan:  Acute right femoral neck fracture - s/p mechanical fall -Underwent right total hip replacement with orthopedic surgery on 07/16/2022 - Follow-up assessment with PT; plans are for SNF  Atrial fib with RVR - situational and no known history; episode last very briefly on 10/25 - s/p dosing of lopressor; will discontinue for now and monitor off - will obtain echo if recurs and start further workup   Acute delirium - Not unexpected given age, surgery, and hospitalization - Continue delirium precautions   Leukocytosis: Likely reactive   Depression: Continue Zoloft   GERD: Continue Protonix   Hyperlipidemia: Continue Crestor.  Old records reviewed in assessment of this patient  Antimicrobials:   DVT prophylaxis:  SCDs Start: 07/16/22 1445 Place TED hose Start: 07/16/22 1445 SCDs Start: 07/14/22 2315   Code Status:   Code Status: Full Code  Mobility Assessment (last 72 hours)     Mobility Assessment     Row Name 07/18/22 1500 07/18/22 1200 07/18/22 0714 07/16/22 2012 07/16/22 1501   Does patient have an order for bedrest or is patient medically unstable -- -- No - Continue assessment No - Continue assessment No - Continue assessment   What is the highest level of mobility based on the progressive mobility assessment? Level 5 (Walks with  assist in room/hall) - Balance while stepping forward/back and can walk in room with assist - Complete Level 5 (Walks with assist in room/hall) - Balance while stepping forward/back and can walk in room with assist - Complete Level 2 ([place]) - Balance while sitting on edge of bed and cannot stand Level 2 (Chairfast) - Balance while sitting on edge of bed and cannot stand Level 2 (Chairfast) - Balance while sitting on edge of bed and cannot stand   Is the above level different from baseline mobility prior to current illness? -- -- Yes - Recommend PT order Yes - Recommend PT order Yes - Recommend PT order    Row Name 07/16/22 0731 07/15/22 1923         Does patient have an order for bedrest or is patient medically unstable Yes- Bedfast (Level 1) - Complete Yes- Bedfast (Level 1) - Complete      What is the highest level of mobility based on the progressive mobility assessment? Level 1 (Bedfast) - Unable to balance while sitting on edge of bed Level 1 (Bedfast) - Unable to balance while sitting on edge of bed      Is the above level different from baseline mobility prior to current illness? Yes - Recommend PT order Yes - Recommend PT order               Barriers to discharge:  Disposition Plan:  pending PT eval Status is: Inpt  Objective: Blood pressure 112/64, pulse 60,  temperature 98 F (36.7 C), temperature source Oral, resp. rate 18, height '5\' 7"'$  (1.702 m), weight 79.7 kg, SpO2 96 %.  Examination:  Physical Exam Constitutional:      General: He is not in acute distress.    Appearance: Normal appearance.  HENT:     Head: Normocephalic and atraumatic.     Mouth/Throat:     Mouth: Mucous membranes are moist.  Eyes:     Extraocular Movements: Extraocular movements intact.  Cardiovascular:     Rate and Rhythm: Normal rate and regular rhythm.     Heart sounds: Normal heart sounds.  Pulmonary:     Effort: Pulmonary effort is normal. No respiratory distress.     Breath sounds:  Normal breath sounds. No wheezing.  Abdominal:     General: Bowel sounds are normal. There is no distension.     Palpations: Abdomen is soft.     Tenderness: There is no abdominal tenderness.  Musculoskeletal:        General: Normal range of motion.     Cervical back: Normal range of motion and neck supple.     Comments: Surgical dressing intact in right hip.  Compartments soft  Skin:    General: Skin is warm and dry.  Neurological:     General: No focal deficit present.     Mental Status: He is alert. He is disoriented.     Comments: Oriented to name only  Psychiatric:        Mood and Affect: Mood normal.        Behavior: Behavior normal.     Consultants:  Orthopedic surgery  Procedures:  07/16/2022: Right total hip replacement  Data Reviewed: Results for orders placed or performed during the hospital encounter of 07/14/22 (from the past 24 hour(s))  Basic metabolic panel     Status: Abnormal   Collection Time: 07/18/22  3:17 AM  Result Value Ref Range   Sodium 140 135 - 145 mmol/L   Potassium 3.6 3.5 - 5.1 mmol/L   Chloride 106 98 - 111 mmol/L   CO2 27 22 - 32 mmol/L   Glucose, Bld 144 (H) 70 - 99 mg/dL   BUN 29 (H) 8 - 23 mg/dL   Creatinine, Ser 0.79 0.61 - 1.24 mg/dL   Calcium 8.4 (L) 8.9 - 10.3 mg/dL   GFR, Estimated >60 >60 mL/min   Anion gap 7 5 - 15  CBC with Differential/Platelet     Status: Abnormal   Collection Time: 07/18/22  3:17 AM  Result Value Ref Range   WBC 10.7 (H) 4.0 - 10.5 K/uL   RBC 3.36 (L) 4.22 - 5.81 MIL/uL   Hemoglobin 11.0 (L) 13.0 - 17.0 g/dL   HCT 33.5 (L) 39.0 - 52.0 %   MCV 99.7 80.0 - 100.0 fL   MCH 32.7 26.0 - 34.0 pg   MCHC 32.8 30.0 - 36.0 g/dL   RDW 14.1 11.5 - 15.5 %   Platelets 186 150 - 400 K/uL   nRBC 0.0 0.0 - 0.2 %   Neutrophils Relative % 79 %   Neutro Abs 8.5 (H) 1.7 - 7.7 K/uL   Lymphocytes Relative 11 %   Lymphs Abs 1.1 0.7 - 4.0 K/uL   Monocytes Relative 10 %   Monocytes Absolute 1.1 (H) 0.1 - 1.0 K/uL    Eosinophils Relative 0 %   Eosinophils Absolute 0.0 0.0 - 0.5 K/uL   Basophils Relative 0 %   Basophils Absolute 0.0 0.0 - 0.1 K/uL  Immature Granulocytes 0 %   Abs Immature Granulocytes 0.03 0.00 - 0.07 K/uL  Magnesium     Status: None   Collection Time: 07/18/22  3:17 AM  Result Value Ref Range   Magnesium 2.2 1.7 - 2.4 mg/dL    I have Reviewed nursing notes, Vitals, and Lab results since pt's last encounter. Pertinent lab results : see above I have ordered test including BMP, CBC, Mg I have reviewed the last note from staff over past 24 hours I have discussed pt's care plan and test results with nursing staff, case manager   LOS: 4 days   Dwyane Dee, MD [facility] 07/18/2022, 5:26 PM

## 2022-07-18 NOTE — NC FL2 (Signed)
[place] MEDICAID FL2 LEVEL OF CARE SCREENING TOOL     IDENTIFICATION  Patient Name: Taylor Ochoa Birthdate: 02/09/1956 Sex: male Admission Date (Current Location): 07/14/2022  [place] and [place] Number:  Herbalist and Address:  [facility],  [place], [place]      Provider Number: [id number]  Attending Physician Name and Address:  Dwyane Dee, MD  Relative Name and Phone Number:  Dmetrius Ambs [phone]    Current Level of Care: Hospital Recommended Level of Care: [place] Prior Approval Number:    Date Approved/Denied:   PASRR Number: [id number]  Discharge Plan: SNF    Current Diagnoses: Patient Active Problem List   Diagnosis Date Noted   S/P total right hip arthroplasty 07/16/2022   Fall 07/15/2022   COPD (chronic obstructive pulmonary disease) ([place]) 07/15/2022   Closed fracture of neck of right femur ([place]) 07/14/2022   Need for prophylactic vaccination with combined vaccine 01/23/2022   Screen for colon cancer 01/23/2022   BPH with obstruction/lower urinary tract symptoms 07/26/2021   Cerebral ventriculomegaly 02/29/2020   Mucopurulent chronic bronchitis ([place]) 10/12/2019   Prediabetes 10/12/2019   Routine general medical examination at a health care facility 04/08/2019   Panlobular emphysema ([place]) 04/08/2019   Hyperlipidemia with target LDL less than 130 04/08/2019   Long toenail 04/08/2019   GAD (generalized anxiety disorder) 04/08/2019   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   Leukocytosis    GERD (gastroesophageal reflux disease) 02/22/2014   Tobacco abuse 02/22/2014   Depression 04/20/2010    Orientation RESPIRATION BLADDER Height & Weight     Self, Place  Normal Continent, External catheter Weight: 175 lb 11.3 oz (79.7 kg) Height:  '5\' 7"'$  (170.2 cm)  BEHAVIORAL SYMPTOMS/MOOD NEUROLOGICAL BOWEL NUTRITION STATUS      Continent    AMBULATORY STATUS COMMUNICATION OF NEEDS Skin   Limited  Assist Verbally Other (Comment) (surgical incision only)                       Personal Care Assistance Level of Assistance  Bathing, Dressing Bathing Assistance: Limited assistance   Dressing Assistance: Limited assistance     Functional Limitations Info             [place]  PT (By licensed PT), OT (By licensed OT)     PT Frequency: 5x/wk OT Frequency: 5x/wk            Contractures Contractures Info: Not present    Additional Factors Info  Code Status, Allergies Code Status Info: Full Allergies Info: NKDA           Current Medications (07/18/2022):  This is the current hospital active medication list Current Facility-Administered Medications  Medication Dose Route Frequency Provider Last Rate Last Admin   acetaminophen (TYLENOL) tablet 650 mg  650 mg Oral Q6H PRN Dwyane Dee, MD   650 mg at 07/17/22 2115   Or   acetaminophen (TYLENOL) suppository 650 mg  650 mg Rectal Q6H PRN Dwyane Dee, MD       albuterol (PROVENTIL) (2.5 MG/3ML) 0.083% nebulizer solution 2.5 mg  2.5 mg Nebulization Q4H PRN Dwyane Dee, MD       aspirin chewable tablet 81 mg  81 mg Oral BID Dwyane Dee, MD   81 mg at 07/18/22 0803   bisacodyl (DULCOLAX) suppository 10 mg  10 mg Rectal Daily PRN Dwyane Dee, MD       docusate sodium (COLACE)  capsule 100 mg  100 mg Oral BID Dwyane Dee, MD   100 mg at 07/18/22 0802   ferrous sulfate tablet 325 mg  325 mg Oral TID Gari Crown, MD   325 mg at 07/18/22 1207   HYDROcodone-acetaminophen (NORCO/VICODIN) 5-325 MG per tablet 1 tablet  1 tablet Oral Q4H PRN Irving Copas, PA-C   1 tablet at 07/18/22 1513   menthol-cetylpyridinium (CEPACOL) lozenge 3 mg  1 lozenge Oral PRN Dwyane Dee, MD       Or   phenol (CHLORASEPTIC) mouth spray 1 spray  1 spray Mouth/Throat PRN Dwyane Dee, MD       methocarbamol (ROBAXIN) tablet 500 mg  500 mg Oral Q6H PRN Costella Hatcher R, PA-C   500 mg at 07/17/22 5038    Or   methocarbamol (ROBAXIN) 500 mg in dextrose 5 % 50 mL IVPB  500 mg Intravenous Q6H PRN Irving Copas, PA-C       metoCLOPramide (REGLAN) tablet 5-10 mg  5-10 mg Oral Q8H PRN Irving Copas, PA-C       Or   metoCLOPramide (REGLAN) injection 5-10 mg  5-10 mg Intravenous Q8H PRN Costella Hatcher R, PA-C       metoprolol tartrate (LOPRESSOR) injection 5 mg  5 mg Intravenous Q2H PRN Dwyane Dee, MD   5 mg at 07/17/22 1521   metoprolol tartrate (LOPRESSOR) tablet 25 mg  25 mg Oral BID Dwyane Dee, MD   25 mg at 07/18/22 0802   morphine (PF) 2 MG/ML injection 0.5-1 mg  0.5-1 mg Intravenous Q2H PRN Irving Copas, PA-C   1 mg at 07/17/22 1521   naloxone (NARCAN) injection 0.4 mg  0.4 mg Intravenous PRN Dwyane Dee, MD       ondansetron [facility]) tablet 4 mg  4 mg Oral Q6H PRN Irving Copas, PA-C       Or   ondansetron [facility]) injection 4 mg  4 mg Intravenous Q6H PRN Irving Copas, PA-C       pantoprazole (PROTONIX) EC tablet 40 mg  40 mg Oral Daily Dwyane Dee, MD   40 mg at 07/18/22 0803   polyethylene glycol (MIRALAX / GLYCOLAX) packet 17 g  17 g Oral Daily Dwyane Dee, MD   17 g at 07/18/22 0802   senna (SENOKOT) tablet 17.2 mg  2 tablet Oral Standley Brooking, MD   17.2 mg at 07/17/22 2115   umeclidinium bromide (INCRUSE ELLIPTA) 62.5 MCG/ACT 1 puff  1 puff Inhalation Daily Dwyane Dee, MD         Discharge Medications: Please see discharge summary for a list of discharge medications.  Relevant Imaging Results:  Relevant Lab Results:   Additional Information SSN [id number]  Lennart Pall, LCSW

## 2022-07-18 NOTE — Plan of Care (Signed)
  Problem: Activity: Goal: Risk for activity intolerance will decrease Outcome: Progressing   Problem: Pain Managment: Goal: General experience of comfort will improve Outcome: Progressing   Problem: Safety: Goal: Ability to remain free from injury will improve Outcome: Progressing   

## 2022-07-18 NOTE — Evaluation (Signed)
Physical Therapy Evaluation Patient Details Name: Taylor Ochoa MRN: [id number] DOB: 1956-05-11 Today's Date: 07/18/2022  History of Present Illness  Pt s/p fall and now s/p R THR by anterior direct approach.  Pt with hx of COPD  Clinical Impression  Pt admitted as above and presenting with functional mobility limitations 2* decreased R LE strength/ROM, post op pain and questionable safety awareness.  Pt would benefit from follow up rehab at SNF level to maximize IND and safety prior to return home with limited assist.     Recommendations for follow up therapy are one component of a multi-disciplinary discharge planning process, led by the attending physician.  Recommendations may be updated based on patient status, additional functional criteria and insurance authorization.  Follow Up Recommendations Skilled nursing-short term rehab (<3 hours/day) Can patient physically be transported by private vehicle: Yes    Assistance Recommended at Discharge Frequent or constant Supervision/Assistance  Patient can return home with the following  A little help with walking and/or transfers;A little help with bathing/dressing/bathroom;Assistance with cooking/housework;Assist for transportation;Help with stairs or ramp for entrance    Equipment Recommendations None recommended by PT  Recommendations for Other Services       Functional Status Assessment Patient has had a recent decline in their functional status and demonstrates the ability to make significant improvements in function in a reasonable and predictable amount of time.     Precautions / Restrictions Precautions Precautions: Fall Restrictions Weight Bearing Restrictions: No RLE Weight Bearing: Weight bearing as tolerated      Mobility  Bed Mobility Overal bed mobility: Needs Assistance Bed Mobility: Supine to Sit     Supine to sit: Min assist, Mod assist     General bed mobility comments: increased time with cues for  sequence and use of L LE to self assist    Transfers Overall transfer level: Needs assistance Equipment used: Rolling walker (2 wheels) Transfers: Sit to/from Stand Sit to Stand: Min assist, Mod assist           General transfer comment: cues for LE management and use of UEs to self assist    Ambulation/Gait Ambulation/Gait assistance: Min assist Gait Distance (Feet): 48 Feet Assistive device: Rolling walker (2 wheels) Gait Pattern/deviations: Step-to pattern, Decreased step length - right, Decreased step length - left, Shuffle, Trunk flexed Gait velocity: decr     General Gait Details: cues for sequence, posture and position from [employer]            Wheelchair Mobility    Modified Rankin (Stroke Patients Only)       Balance Overall balance assessment: Needs assistance Sitting-balance support: No upper extremity supported, Feet supported Sitting balance-Leahy Scale: Good     Standing balance support: Bilateral upper extremity supported Standing balance-Leahy Scale: Poor                               Pertinent Vitals/Pain Pain Assessment Pain Assessment: Faces Faces Pain Scale: Hurts a little bit Pain Location: R hip Pain Descriptors / Indicators: Aching, Sore Pain Intervention(s): Limited activity within patient's tolerance, Monitored during session, Premedicated before session, Ice applied    Home Living Family/patient expects to be discharged to:: Skilled nursing facility                        Prior Function Prior Level of Function : Independent/Modified Independent  Hand Dominance        Extremity/Trunk Assessment   Upper Extremity Assessment Upper Extremity Assessment: Overall WFL for tasks assessed    Lower Extremity Assessment Lower Extremity Assessment: RLE deficits/detail RLE Deficits / Details: AAROM at hip to 90 flex and 20 abd    Cervical / Trunk Assessment Cervical / Trunk  Assessment: Normal  Communication   Communication: No difficulties  Cognition Arousal/Alertness: Awake/alert Behavior During Therapy: WFL for tasks assessed/performed, Impulsive Overall Cognitive Status: Within Functional Limits for tasks assessed                                          General Comments      Exercises Total Joint Exercises Ankle Circles/Pumps: AROM, Both, 15 reps, Supine Quad Sets: AROM, Both, 10 reps, Supine Heel Slides: AAROM, 20 reps, Supine, Right Hip ABduction/ADduction: AAROM, Right, 15 reps, Supine   Assessment/Plan    PT Assessment Patient needs continued PT services  PT Problem List Decreased strength;Decreased range of motion;Decreased activity tolerance;Decreased balance;Decreased mobility;Decreased knowledge of use of DME;Pain;Decreased safety awareness       PT Treatment Interventions DME instruction;Gait training;Stair training;Functional mobility training;Therapeutic activities;Therapeutic exercise;Balance training;Patient/family education    PT Goals (Current goals can be found in the Care Plan section)  Acute Rehab PT Goals Patient Stated Goal: Regain IND PT Goal Formulation: With patient Time For Goal Achievement: 07/25/22 Potential to Achieve Goals: Good    Frequency 7X/week     Co-evaluation               AM-PAC PT "6 Clicks" Mobility  Outcome Measure Help needed turning from your back to your side while in a flat bed without using bedrails?: A Lot Help needed moving from lying on your back to sitting on the side of a flat bed without using bedrails?: A Lot Help needed moving to and from a bed to a chair (including a wheelchair)?: A Lot Help needed standing up from a chair using your arms (e.g., wheelchair or bedside chair)?: A Lot Help needed to walk in hospital room?: A Little Help needed climbing 3-5 steps with a railing? : A Lot 6 Click Score: 13    End of Session Equipment Utilized During Treatment:  Gait belt Activity Tolerance: Patient tolerated treatment well Patient left: in chair;with call bell/phone within reach;with chair alarm set;with family/visitor present Nurse Communication: Mobility status PT Visit Diagnosis: Unsteadiness on feet (R26.81);Difficulty in walking, not elsewhere classified (R26.2)    Time: 0272-5366 PT Time Calculation (min) (ACUTE ONLY): 27 min   Charges:   PT Evaluation $PT Eval Low Complexity: 1 Low PT Treatments $Therapeutic Exercise: 8-22 mins        Debe Coder PT Acute Rehabilitation Services Pager [phone] Office [phone]   Lakshya Mcgillicuddy 07/18/2022, 1:05 PM

## 2022-07-19 DIAGNOSIS — S72001A Fracture of unspecified part of neck of right femur, initial encounter for closed fracture: Secondary | ICD-10-CM | POA: Diagnosis not present

## 2022-07-19 LAB — CBC WITH DIFFERENTIAL/PLATELET
Abs Immature Granulocytes: 0.04 10*3/uL (ref 0.00–0.07)
Basophils Absolute: 0 10*3/uL (ref 0.0–0.1)
Basophils Relative: 0 %
Eosinophils Absolute: 0 10*3/uL (ref 0.0–0.5)
Eosinophils Relative: 0 %
HCT: 33.5 % — ABNORMAL LOW (ref 39.0–52.0)
Hemoglobin: 11.3 g/dL — ABNORMAL LOW (ref 13.0–17.0)
Immature Granulocytes: 0 %
Lymphocytes Relative: 19 %
Lymphs Abs: 1.9 10*3/uL (ref 0.7–4.0)
MCH: 32.9 pg (ref 26.0–34.0)
MCHC: 33.7 g/dL (ref 30.0–36.0)
MCV: 97.7 fL (ref 80.0–100.0)
Monocytes Absolute: 0.8 10*3/uL (ref 0.1–1.0)
Monocytes Relative: 8 %
Neutro Abs: 7.3 10*3/uL (ref 1.7–7.7)
Neutrophils Relative %: 73 %
Platelets: 194 10*3/uL (ref 150–400)
RBC: 3.43 MIL/uL — ABNORMAL LOW (ref 4.22–5.81)
RDW: 14.2 % (ref 11.5–15.5)
WBC: 10.1 10*3/uL (ref 4.0–10.5)
nRBC: 0 % (ref 0.0–0.2)

## 2022-07-19 LAB — MAGNESIUM: Magnesium: 2.1 mg/dL (ref 1.7–2.4)

## 2022-07-19 LAB — BASIC METABOLIC PANEL
Anion gap: 8 (ref 5–15)
BUN: 29 mg/dL — ABNORMAL HIGH (ref 8–23)
CO2: 26 mmol/L (ref 22–32)
Calcium: 8.4 mg/dL — ABNORMAL LOW (ref 8.9–10.3)
Chloride: 107 mmol/L (ref 98–111)
Creatinine, Ser: 0.75 mg/dL (ref 0.61–1.24)
GFR, Estimated: >60 mL/min (ref >60)
Glucose, Bld: 126 mg/dL — ABNORMAL HIGH (ref 70–99)
Potassium: 3.9 mmol/L (ref 3.5–5.1)
Sodium: 141 mmol/L (ref 135–145)

## 2022-07-19 MED ORDER — NICOTINE 21 MG/24HR TD PT24
21.0000 mg | MEDICATED_PATCH | Freq: Every day | TRANSDERMAL | Status: DC
  Administered 2022-07-19 – 2022-07-22 (×4): 21 mg via TRANSDERMAL
  Filled 2022-07-19 (×4): qty 1

## 2022-07-19 NOTE — Progress Notes (Signed)
Physical Therapy Treatment Patient Details Name: Taylor Ochoa MRN: [id number] DOB: May 11, 1956 Today's Date: 07/19/2022   History of Present Illness Pt s/p fall and now s/p R THR by anterior direct approach.  Pt with hx of COPD    PT Comments    Pt very cooperative and progressing steadily with mobility but also remains mildly impulsive with questionable safety awareness.  Pt up to ambulate increased distance in hall demonstrating improved stability, required decreased assist for all tasks and performed therex program with assist.   Recommendations for follow up therapy are one component of a multi-disciplinary discharge planning process, led by the attending physician.  Recommendations may be updated based on patient status, additional functional criteria and insurance authorization.  Follow Up Recommendations  Skilled nursing-short term rehab (<3 hours/day) Can patient physically be transported by private vehicle: Yes   Assistance Recommended at Discharge Frequent or constant Supervision/Assistance  Patient can return home with the following A little help with walking and/or transfers;A little help with bathing/dressing/bathroom;Assistance with cooking/housework;Assist for transportation;Help with stairs or ramp for entrance   Equipment Recommendations  None recommended by PT    Recommendations for Other Services       Precautions / Restrictions Precautions Precautions: Fall Restrictions Weight Bearing Restrictions: No RLE Weight Bearing: Weight bearing as tolerated     Mobility  Bed Mobility Overal bed mobility: Needs Assistance Bed Mobility: Supine to Sit     Supine to sit: Min assist     General bed mobility comments: increased time with cues for sequence and use of L LE to self assist    Transfers Overall transfer level: Needs assistance Equipment used: Rolling walker (2 wheels) Transfers: Sit to/from Stand Sit to Stand: Min assist, Min guard            General transfer comment: cues for LE management and use of UEs to self assist    Ambulation/Gait Ambulation/Gait assistance: Min assist Gait Distance (Feet): 68 Feet Assistive device: Rolling walker (2 wheels) Gait Pattern/deviations: Step-to pattern, Decreased step length - right, Decreased step length - left, Shuffle, Trunk flexed Gait velocity: decr     General Gait Details: cues for sequence, posture and position from [employer]             Wheelchair Mobility    Modified Rankin (Stroke Patients Only)       Balance Overall balance assessment: Needs assistance Sitting-balance support: No upper extremity supported, Feet supported Sitting balance-Leahy Scale: Good     Standing balance support: Single extremity supported Standing balance-Leahy Scale: Poor                              Cognition Arousal/Alertness: Awake/alert Behavior During Therapy: WFL for tasks assessed/performed, Impulsive Overall Cognitive Status: Within Functional Limits for tasks assessed                                          Exercises Total Joint Exercises Ankle Circles/Pumps: AROM, Both, 15 reps, Supine Quad Sets: AROM, Both, 10 reps, Supine Heel Slides: AAROM, 20 reps, Supine, Right Hip ABduction/ADduction: AAROM, Right, 15 reps, Supine    General Comments        Pertinent Vitals/Pain Pain Assessment Pain Assessment: 0-10 Pain Score: 5  Faces Pain Scale: Hurts a little bit Pain Location: R hip Pain Descriptors /  Indicators: Aching, Sore Pain Intervention(s): Limited activity within patient's tolerance    Home Living                          Prior Function            PT Goals (current goals can now be found in the care plan section) Acute Rehab PT Goals Patient Stated Goal: Regain IND PT Goal Formulation: With patient Time For Goal Achievement: 07/25/22 Potential to Achieve Goals: Good Progress towards PT goals:  Progressing toward goals    Frequency    7X/week      PT Plan Current plan remains appropriate    Co-evaluation              AM-PAC PT "6 Clicks" Mobility   Outcome Measure  Help needed turning from your back to your side while in a flat bed without using bedrails?: A Lot Help needed moving from lying on your back to sitting on the side of a flat bed without using bedrails?: A Little Help needed moving to and from a bed to a chair (including a wheelchair)?: A Little Help needed standing up from a chair using your arms (e.g., wheelchair or bedside chair)?: A Little Help needed to walk in hospital room?: A Little Help needed climbing 3-5 steps with a railing? : A Lot 6 Click Score: 16    End of Session Equipment Utilized During Treatment: Gait belt Activity Tolerance: Patient tolerated treatment well Patient left: in chair;with call bell/phone within reach;with chair alarm set Nurse Communication: Mobility status PT Visit Diagnosis: Unsteadiness on feet (R26.81);Difficulty in walking, not elsewhere classified (R26.2)     Time: 0223-3612 PT Time Calculation (min) (ACUTE ONLY): 30 min  Charges:  $Gait Training: 8-22 mins $Therapeutic Exercise: 8-22 mins                     [facility] Pager [phone] Office [phone]    Puneet Selden 07/19/2022, 12:26 PM

## 2022-07-19 NOTE — Progress Notes (Signed)
Progress Note    DARSHAWN BOATENG   JJH:[id number]  DOB: 08-12-1956  DOA: 07/14/2022     5 PCP: Janith Lima, MD  Initial CC: fall  Hospital Course: Mr. Royster is a 66  yo male with PMH HLD, COPD, depression, GERD who presented after a mechanical fall.  He landed on his right side and was brought to the ER for evaluation. Pelvis imaging revealed acute right femoral neck fracture.  He was evaluated by orthopedic surgery and underwent right total hip replacement on 07/16/2022.  Interval History:  No events overnight.  Brief episode of afib with RVR on 10/25, he converted back to NSR within an hour or 2.   Assessment and Plan:  Acute right femoral neck fracture - s/p mechanical fall -Underwent right total hip replacement with orthopedic surgery on 07/16/2022 - Follow-up assessment with PT; plans are for SNF; [facility]   Atrial fib with RVR - situational and no known history; episode last very briefly on 10/25 - s/p dosing of lopressor; will discontinue for now and monitor off - will obtain echo if recurs and start further workup   Acute delirium - Not unexpected given age, surgery, and hospitalization - Continue delirium precautions   Leukocytosis: Likely reactive   Depression: Continue Zoloft   GERD: Continue Protonix   Hyperlipidemia: Continue Crestor.  Old records reviewed in assessment of this patient  Antimicrobials:   DVT prophylaxis:  SCDs Start: 07/16/22 1445 Place TED hose Start: 07/16/22 1445 SCDs Start: 07/14/22 2315   Code Status:   Code Status: Full Code  Mobility Assessment (last 72 hours)     Mobility Assessment     Row Name 07/19/22 1200 07/19/22 0727 07/18/22 1500 07/18/22 1200 07/18/22 0714   Does patient have an order for bedrest or is patient medically unstable -- No - Continue assessment -- -- No - Continue assessment   What is the highest level of mobility based on the progressive mobility assessment? Level 5 (Walks with assist in  room/hall) - Balance while stepping forward/back and can walk in room with assist - Complete Level 5 (Walks with assist in room/hall) - Balance while stepping forward/back and can walk in room with assist - Complete Level 5 (Walks with assist in room/hall) - Balance while stepping forward/back and can walk in room with assist - Complete Level 5 (Walks with assist in room/hall) - Balance while stepping forward/back and can walk in room with assist - Complete Level 2 (Chairfast) - Balance while sitting on edge of bed and cannot stand   Is the above level different from baseline mobility prior to current illness? -- Yes - Recommend PT order -- -- Yes - Recommend PT order    Row Name 07/16/22 2012 07/16/22 1501         Does patient have an order for bedrest or is patient medically unstable No - Continue assessment No - Continue assessment      What is the highest level of mobility based on the progressive mobility assessment? Level 2 (Chairfast) - Balance while sitting on edge of bed and cannot stand Level 2 (Chairfast) - Balance while sitting on edge of bed and cannot stand      Is the above level different from baseline mobility prior to current illness? Yes - Recommend PT order Yes - Recommend PT order               Barriers to discharge:  Disposition Plan:  pending PT eval Status is:  Inpt  Objective: Blood pressure 118/84, pulse 77, temperature 97.8 F (36.6 C), resp. rate 18, height '5\' 7"'$  (1.702 m), weight 64.1 kg, SpO2 94 %.  Examination:  Physical Exam Constitutional:      General: He is not in acute distress.    Appearance: Normal appearance.  HENT:     Head: Normocephalic and atraumatic.     Mouth/Throat:     Mouth: Mucous membranes are moist.  Eyes:     Extraocular Movements: Extraocular movements intact.  Cardiovascular:     Rate and Rhythm: Normal rate and regular rhythm.     Heart sounds: Normal heart sounds.  Pulmonary:     Effort: Pulmonary effort is normal. No  respiratory distress.     Breath sounds: Normal breath sounds. No wheezing.  Abdominal:     General: Bowel sounds are normal. There is no distension.     Palpations: Abdomen is soft.     Tenderness: There is no abdominal tenderness.  Musculoskeletal:        General: Normal range of motion.     Cervical back: Normal range of motion and neck supple.     Comments: Surgical dressing intact in right hip.  Compartments soft  Skin:    General: Skin is warm and dry.  Neurological:     General: No focal deficit present.     Mental Status: He is alert. He is disoriented.     Comments: Oriented to name only  Psychiatric:        Mood and Affect: Mood normal.        Behavior: Behavior normal.    Consultants:  Orthopedic surgery  Procedures:  07/16/2022: Right total hip replacement  Data Reviewed: Results for orders placed or performed during the hospital encounter of 07/14/22 (from the past 24 hour(s))  Basic metabolic panel     Status: Abnormal   Collection Time: 07/19/22  3:30 AM  Result Value Ref Range   Sodium 141 135 - 145 mmol/L   Potassium 3.9 3.5 - 5.1 mmol/L   Chloride 107 98 - 111 mmol/L   CO2 26 22 - 32 mmol/L   Glucose, Bld 126 (H) 70 - 99 mg/dL   BUN 29 (H) 8 - 23 mg/dL   Creatinine, Ser 0.75 0.61 - 1.24 mg/dL   Calcium 8.4 (L) 8.9 - 10.3 mg/dL   GFR, Estimated >60 >60 mL/min   Anion gap 8 5 - 15  CBC with Differential/Platelet     Status: Abnormal   Collection Time: 07/19/22  3:30 AM  Result Value Ref Range   WBC 10.1 4.0 - 10.5 K/uL   RBC 3.43 (L) 4.22 - 5.81 MIL/uL   Hemoglobin 11.3 (L) 13.0 - 17.0 g/dL   HCT 33.5 (L) 39.0 - 52.0 %   MCV 97.7 80.0 - 100.0 fL   MCH 32.9 26.0 - 34.0 pg   MCHC 33.7 30.0 - 36.0 g/dL   RDW 14.2 11.5 - 15.5 %   Platelets 194 150 - 400 K/uL   nRBC 0.0 0.0 - 0.2 %   Neutrophils Relative % 73 %   Neutro Abs 7.3 1.7 - 7.7 K/uL   Lymphocytes Relative 19 %   Lymphs Abs 1.9 0.7 - 4.0 K/uL   Monocytes Relative 8 %   Monocytes Absolute  0.8 0.1 - 1.0 K/uL   Eosinophils Relative 0 %   Eosinophils Absolute 0.0 0.0 - 0.5 K/uL   Basophils Relative 0 %   Basophils Absolute 0.0 0.0 - 0.1  K/uL   Immature Granulocytes 0 %   Abs Immature Granulocytes 0.04 0.00 - 0.07 K/uL  Magnesium     Status: None   Collection Time: 07/19/22  3:30 AM  Result Value Ref Range   Magnesium 2.1 1.7 - 2.4 mg/dL    I have Reviewed nursing notes, Vitals, and Lab results since pt's last encounter. Pertinent lab results : see above I have ordered test including BMP, CBC, Mg I have reviewed the last note from staff over past 24 hours I have discussed pt's care plan and test results with nursing staff, case manager   LOS: 5 days   Dwyane Dee, MD [facility] 07/19/2022, 2:55 PM

## 2022-07-19 NOTE — Progress Notes (Signed)
Physical Therapy Treatment Patient Details Name: Taylor Ochoa MRN: [id number] DOB: Oct 30, 1955 Today's Date: 07/19/2022   History of Present Illness Pt s/p fall and now s/p R THR by anterior direct approach.  Pt with hx of COPD    PT Comments    Pt continues very cooperative and progressing steadily with mobility - noted increase in distance ambualted and decreasing level of assist and cueing for most tasks.   Recommendations for follow up therapy are one component of a multi-disciplinary discharge planning process, led by the attending physician.  Recommendations may be updated based on patient status, additional functional criteria and insurance authorization.  Follow Up Recommendations  Skilled nursing-short term rehab (<3 hours/day) Can patient physically be transported by private vehicle: Yes   Assistance Recommended at Discharge Frequent or constant Supervision/Assistance  Patient can return home with the following A little help with walking and/or transfers;A little help with bathing/dressing/bathroom;Assistance with cooking/housework;Assist for transportation;Help with stairs or ramp for entrance   Equipment Recommendations  None recommended by PT    Recommendations for Other Services       Precautions / Restrictions Precautions Precautions: Fall Restrictions Weight Bearing Restrictions: No RLE Weight Bearing: Weight bearing as tolerated     Mobility  Bed Mobility Overal bed mobility: Needs Assistance Bed Mobility: Sit to Supine     Supine to sit: Min assist Sit to supine: Min assist   General bed mobility comments: increased time with cues for sequence and use of L LE to self assist    Transfers Overall transfer level: Needs assistance Equipment used: Rolling walker (2 wheels) Transfers: Sit to/from Stand Sit to Stand: Min assist, Min guard           General transfer comment: cues for LE management and use of UEs to self assist     Ambulation/Gait Ambulation/Gait assistance: Min assist, Min guard Gait Distance (Feet): 64 Feet (64' twice) Assistive device: Rolling walker (2 wheels) Gait Pattern/deviations: Step-to pattern, Decreased step length - right, Decreased step length - left, Shuffle, Trunk flexed Gait velocity: decr     General Gait Details: cues for sequence, posture and position from [employer]             Wheelchair Mobility    Modified Rankin (Stroke Patients Only)       Balance Overall balance assessment: Needs assistance Sitting-balance support: No upper extremity supported, Feet supported Sitting balance-Leahy Scale: Good     Standing balance support: Single extremity supported Standing balance-Leahy Scale: Poor                              Cognition Arousal/Alertness: Awake/alert Behavior During Therapy: WFL for tasks assessed/performed, Impulsive Overall Cognitive Status: Within Functional Limits for tasks assessed                                          Exercises Total Joint Exercises Ankle Circles/Pumps: AROM, Both, 15 reps, Supine Quad Sets: AROM, Both, 10 reps, Supine Heel Slides: AAROM, 20 reps, Supine, Right Hip ABduction/ADduction: AAROM, Right, 15 reps, Supine    General Comments        Pertinent Vitals/Pain Pain Assessment Pain Assessment: 0-10 Pain Score: 4  Faces Pain Scale: Hurts a little bit Pain Location: R hip Pain Descriptors / Indicators: Aching, Sore Pain Intervention(s): Limited activity within patient's  tolerance    Home Living                          Prior Function            PT Goals (current goals can now be found in the care plan section) Acute Rehab PT Goals Patient Stated Goal: Regain IND PT Goal Formulation: With patient Time For Goal Achievement: 07/25/22 Potential to Achieve Goals: Good Progress towards PT goals: Progressing toward goals    Frequency    7X/week      PT  Plan Current plan remains appropriate    Co-evaluation              AM-PAC PT "6 Clicks" Mobility   Outcome Measure  Help needed turning from your back to your side while in a flat bed without using bedrails?: A Lot Help needed moving from lying on your back to sitting on the side of a flat bed without using bedrails?: A Little Help needed moving to and from a bed to a chair (including a wheelchair)?: A Little Help needed standing up from a chair using your arms (e.g., wheelchair or bedside chair)?: A Little Help needed to walk in hospital room?: A Little Help needed climbing 3-5 steps with a railing? : A Lot 6 Click Score: 16    End of Session Equipment Utilized During Treatment: Gait belt Activity Tolerance: Patient tolerated treatment well Patient left: in bed;with call bell/phone within reach;with family/visitor present Nurse Communication: Mobility status PT Visit Diagnosis: Unsteadiness on feet (R26.81);Difficulty in walking, not elsewhere classified (R26.2)     Time: 6213-0865 PT Time Calculation (min) (ACUTE ONLY): 28 min  Charges:  $Gait Training: 23-37 mins                     [place] Pager [phone] Office [phone]    Ashni Lonzo 07/19/2022, 4:54 PM

## 2022-07-19 NOTE — TOC Progression Note (Signed)
Transition of Care [facility]) - Progression Note    Patient Details  Name: Taylor Ochoa MRN: [id number] Date of Birth: 1956/08/18  Transition of Care [facility]) CM/SW Contact  Lennart Pall, LCSW Phone Number: 07/19/2022, 2:55 PM  Clinical Narrative:    Pt and wife have accepted SNF bed offer with [facility] and insurance authorization begun.  Facility cannot admit until Monday when bed available.  TOC will continue to follow.   Expected Discharge Plan: [place] Barriers to Discharge: Continued Medical Work up, SNF Pending bed offer  Expected Discharge Plan and Services Expected Discharge Plan: [place] In-house Referral: Clinical Social Work   Post Acute Care Choice: [place] Living arrangements for the past 2 months: Single Family Home                 DME Arranged: N/A DME Agency: NA                   Social Determinants of Health (SDOH) Interventions Food Insecurity Interventions: Intervention Not Indicated Housing Interventions: Intervention Not Indicated Transportation Interventions: Intervention Not Indicated Utilities Interventions: Intervention Not Indicated  Readmission Risk Interventions    07/18/2022    3:57 PM  Readmission Risk Prevention Plan  Post Dischage Appt Complete  Medication Screening Complete  Transportation Screening Complete

## 2022-07-19 NOTE — Plan of Care (Signed)
  Problem: Activity: Goal: Risk for activity intolerance will decrease Outcome: Progressing   Problem: Pain Managment: Goal: General experience of comfort will improve Outcome: Progressing   Problem: Safety: Goal: Ability to remain free from injury will improve Outcome: Progressing   

## 2022-07-20 DIAGNOSIS — S72001A Fracture of unspecified part of neck of right femur, initial encounter for closed fracture: Secondary | ICD-10-CM | POA: Diagnosis not present

## 2022-07-20 NOTE — TOC Progression Note (Signed)
Transition of Care [facility]) - Progression Note    Patient Details  Name: Taylor Ochoa MRN: [id number] Date of Birth: 11/09/1955  Transition of Care [facility]) CM/SW Contact  Ross Ludwig, [place] Phone Number: 07/20/2022, 5:45 PM  Clinical Narrative:     CSW received insurance authorization for patient to go to [facility].  Authorization number [id number], approved for 3 days, next review, 07/24/2022.  SNF can not accept until Monday.  CSW to continue to follow patient's progress throughout discharge planning.    Expected Discharge Plan: [place] Barriers to Discharge: Continued Medical Work up, SNF Pending bed offer  Expected Discharge Plan and Services Expected Discharge Plan: [place] In-house Referral: Clinical Social Work   Post Acute Care Choice: [place] Living arrangements for the past 2 months: Single Family Home                 DME Arranged: N/A DME Agency: NA                   Social Determinants of Health (SDOH) Interventions Food Insecurity Interventions: Intervention Not Indicated Housing Interventions: Intervention Not Indicated Transportation Interventions: Intervention Not Indicated Utilities Interventions: Intervention Not Indicated  Readmission Risk Interventions    07/18/2022    3:57 PM  Readmission Risk Prevention Plan  Post Dischage Appt Complete  Medication Screening Complete  Transportation Screening Complete

## 2022-07-20 NOTE — Progress Notes (Signed)
Physical Therapy Treatment Patient Details Name: Taylor Ochoa MRN: [id number] DOB: May 02, 1956 Today's Date: 07/20/2022   History of Present Illness Pt s/p fall and now s/p R THR by anterior direct approach.  Pt with hx of COPD    PT Comments    Pt continues steady improvement with activity tolerance, stability and safety awareness.   Recommendations for follow up therapy are one component of a multi-disciplinary discharge planning process, led by the attending physician.  Recommendations may be updated based on patient status, additional functional criteria and insurance authorization.  Follow Up Recommendations  Skilled nursing-short term rehab (<3 hours/day) Can patient physically be transported by private vehicle: Yes   Assistance Recommended at Discharge Intermittent Supervision/Assistance  Patient can return home with the following A little help with walking and/or transfers;A little help with bathing/dressing/bathroom;Assistance with cooking/housework;Assist for transportation;Help with stairs or ramp for entrance   Equipment Recommendations  None recommended by PT    Recommendations for Other Services       Precautions / Restrictions Precautions Precautions: Fall Restrictions Weight Bearing Restrictions: No RLE Weight Bearing: Weight bearing as tolerated     Mobility  Bed Mobility Overal bed mobility: Needs Assistance Bed Mobility: Supine to Sit     Supine to sit: Min assist     General bed mobility comments: Pt up in chair and requests back to same    Transfers Overall transfer level: Needs assistance Equipment used: Rolling walker (2 wheels) Transfers: Sit to/from Stand Sit to Stand: Min guard           General transfer comment: cues for LE management and use of UEs to self assist    Ambulation/Gait Ambulation/Gait assistance: Min guard Gait Distance (Feet): 75 Feet (75' twice) Assistive device: Rolling walker (2 wheels) Gait  Pattern/deviations: Step-to pattern, Decreased step length - right, Decreased step length - left, Shuffle, Trunk flexed Gait velocity: decr     General Gait Details: min cues for posture and position from [employer]             Wheelchair Mobility    Modified Rankin (Stroke Patients Only)       Balance Overall balance assessment: Needs assistance Sitting-balance support: No upper extremity supported, Feet supported Sitting balance-Leahy Scale: Good     Standing balance support: No upper extremity supported Standing balance-Leahy Scale: Fair                              Cognition Arousal/Alertness: Awake/alert Behavior During Therapy: WFL for tasks assessed/performed, Impulsive Overall Cognitive Status: Within Functional Limits for tasks assessed                                          Exercises      General Comments        Pertinent Vitals/Pain Pain Assessment Pain Assessment: 0-10 Pain Score: 5  Pain Location: R hip Pain Descriptors / Indicators: Aching, Sore Pain Intervention(s): Limited activity within patient's tolerance, Monitored during session, Patient requesting pain meds-RN notified (pt declines ice)    Home Living                          Prior Function            PT Goals (current goals can now be found  in the care plan section) Acute Rehab PT Goals Patient Stated Goal: Regain IND PT Goal Formulation: With patient Time For Goal Achievement: 07/25/22 Potential to Achieve Goals: Good Progress towards PT goals: Progressing toward goals    Frequency    7X/week      PT Plan Current plan remains appropriate    Co-evaluation              AM-PAC PT "6 Clicks" Mobility   Outcome Measure  Help needed turning from your back to your side while in a flat bed without using bedrails?: A Little Help needed moving from lying on your back to sitting on the side of a flat bed without using  bedrails?: A Little Help needed moving to and from a bed to a chair (including a wheelchair)?: A Little Help needed standing up from a chair using your arms (e.g., wheelchair or bedside chair)?: A Little Help needed to walk in hospital room?: A Little Help needed climbing 3-5 steps with a railing? : A Little 6 Click Score: 18    End of Session Equipment Utilized During Treatment: Gait belt Activity Tolerance: Patient tolerated treatment well Patient left: in chair;with call bell/phone within reach;with chair alarm set Nurse Communication: Mobility status PT Visit Diagnosis: Unsteadiness on feet (R26.81);Difficulty in walking, not elsewhere classified (R26.2)     Time: 6063-0160 PT Time Calculation (min) (ACUTE ONLY): 27 min  Charges:  $Gait Training: 23-37 mins                     [place] Pager [phone] Office [phone]    Chet Greenley 07/20/2022, 3:51 PM

## 2022-07-20 NOTE — Progress Notes (Signed)
Progress Note    Taylor Ochoa   ZDG:[id number]  DOB: 06/07/1956  DOA: 07/14/2022     6 PCP: Janith Lima, MD  Initial CC: fall  Hospital Course: Taylor Ochoa is a 66  yo male with PMH HLD, COPD, depression, GERD who presented after a mechanical fall.  He landed on his right side and was brought to the ER for evaluation. Pelvis imaging revealed acute right femoral neck fracture.  He was evaluated by orthopedic surgery and underwent right total hip replacement on 07/16/2022.  Interval History:  No events overnight.  Doing well this morning and eating breakfast.  Plan remains for rehab on Monday.  Assessment and Plan:  Acute right femoral neck fracture - s/p mechanical fall -Underwent right total hip replacement with orthopedic surgery on 07/16/2022 - Follow-up assessment with PT; plans are for SNF; [facility]   Atrial fib with RVR - situational and no known history; episode last very briefly on 10/25 - s/p dosing of lopressor; will discontinue for now and monitor off - will obtain echo if recurs and start further workup   Acute delirium - Not unexpected given age, surgery, and hospitalization - Continue delirium precautions   Leukocytosis: Likely reactive   Depression: Continue Zoloft   GERD: Continue Protonix   Hyperlipidemia: Continue Crestor.   Old records reviewed in assessment of this patient  Antimicrobials:   DVT prophylaxis:  SCDs Start: 07/16/22 1445 Place TED hose Start: 07/16/22 1445 SCDs Start: 07/14/22 2315   Code Status:   Code Status: Full Code  Mobility Assessment (last 72 hours)     Mobility Assessment     Row Name 07/20/22 0718 07/19/22 1934 07/19/22 1600 07/19/22 1200 07/19/22 0727   Does patient have an order for bedrest or is patient medically unstable No - Continue assessment No - Continue assessment -- -- No - Continue assessment   What is the highest level of mobility based on the progressive mobility assessment? Level 5  (Walks with assist in room/hall) - Balance while stepping forward/back and can walk in room with assist - Complete Level 5 (Walks with assist in room/hall) - Balance while stepping forward/back and can walk in room with assist - Complete Level 5 (Walks with assist in room/hall) - Balance while stepping forward/back and can walk in room with assist - Complete Level 5 (Walks with assist in room/hall) - Balance while stepping forward/back and can walk in room with assist - Complete Level 5 (Walks with assist in room/hall) - Balance while stepping forward/back and can walk in room with assist - Complete   Is the above level different from baseline mobility prior to current illness? Yes - Recommend PT order Yes - Recommend PT order -- -- Yes - Recommend PT order    [place] Name 07/18/22 1500 07/18/22 1200 07/18/22 0714       Does patient have an order for bedrest or is patient medically unstable -- -- No - Continue assessment     What is the highest level of mobility based on the progressive mobility assessment? Level 5 (Walks with assist in room/hall) - Balance while stepping forward/back and can walk in room with assist - Complete Level 5 (Walks with assist in room/hall) - Balance while stepping forward/back and can walk in room with assist - Complete Level 2 ([place]) - Balance while sitting on edge of bed and cannot stand     Is the above level different from baseline mobility prior to current illness? -- --  Yes - Recommend PT order              Barriers to discharge:  Disposition Plan:  [place] Status is: Inpt  Objective: Blood pressure (!) 141/74, pulse 69, temperature 98 F (36.7 C), temperature source Oral, resp. rate 18, height '5\' 7"'$  (1.702 m), weight 65.5 kg, SpO2 98 %.  Examination:  Physical Exam Constitutional:      General: He is not in acute distress.    Appearance: Normal appearance.  HENT:     Head: Normocephalic and atraumatic.     Mouth/Throat:     Mouth: Mucous  membranes are moist.  Eyes:     Extraocular Movements: Extraocular movements intact.  Cardiovascular:     Rate and Rhythm: Normal rate and regular rhythm.     Heart sounds: Normal heart sounds.  Pulmonary:     Effort: Pulmonary effort is normal. No respiratory distress.     Breath sounds: Normal breath sounds. No wheezing.  Abdominal:     General: Bowel sounds are normal. There is no distension.     Palpations: Abdomen is soft.     Tenderness: There is no abdominal tenderness.  Musculoskeletal:        General: Normal range of motion.     Cervical back: Normal range of motion and neck supple.     Comments: Surgical dressing intact in right hip.  Compartments soft  Skin:    General: Skin is warm and dry.  Neurological:     General: No focal deficit present.     Mental Status: He is alert. Mental status is at baseline.  Psychiatric:        Mood and Affect: Mood normal.        Behavior: Behavior normal.     Consultants:  Orthopedic surgery  Procedures:  07/16/2022: Right total hip replacement  Data Reviewed: No results found for this or any previous visit (from the past 24 hour(s)).   I have Reviewed nursing notes, Vitals, and Lab results since pt's last encounter. Pertinent lab results : see above I have reviewed the last note from staff over past 24 hours I have discussed pt's care plan and test results with nursing staff, case manager   LOS: 6 days   Dwyane Dee, MD [facility] 07/20/2022, 12:38 PM

## 2022-07-20 NOTE — Progress Notes (Signed)
Physical Therapy Treatment Patient Details Name: Taylor Ochoa MRN: [id number] DOB: [birth year] Today's Date: 07/20/2022   History of Present Illness Pt s/p fall and now s/p R THR by anterior direct approach.  Pt with hx of COPD    PT Comments    Pt continues motivated and progressing steadily with mobility, pt up to ambulate increased distance in hall with noted improvement in stability and safety awareness and decreased impulsivity.     Recommendations for follow up therapy are one component of a multi-disciplinary discharge planning process, led by the attending physician.  Recommendations may be updated based on patient status, additional functional criteria and insurance authorization.  Follow Up Recommendations  Skilled nursing-short term rehab (<3 hours/day) Can patient physically be transported by private vehicle: Yes   Assistance Recommended at Discharge Frequent or constant Supervision/Assistance  Patient can return home with the following A little help with walking and/or transfers;A little help with bathing/dressing/bathroom;Assistance with cooking/housework;Assist for transportation;Help with stairs or ramp for entrance   Equipment Recommendations  None recommended by PT    Recommendations for Other Services       Precautions / Restrictions Precautions Precautions: Fall Restrictions Weight Bearing Restrictions: No RLE Weight Bearing: Weight bearing as tolerated     Mobility  Bed Mobility Overal bed mobility: Needs Assistance Bed Mobility: Supine to Sit     Supine to sit: Min assist     General bed mobility comments: increased time with cues for sequence and use of L LE to self assist    Transfers Overall transfer level: Needs assistance Equipment used: Rolling walker (2 wheels) Transfers: Sit to/from Stand Sit to Stand: Min assist, Min guard           General transfer comment: cues for LE management and use of UEs to self assist     Ambulation/Gait Ambulation/Gait assistance: Min guard Gait Distance (Feet): 75 Feet (75' twice) Assistive device: Rolling walker (2 wheels) Gait Pattern/deviations: Step-to pattern, Decreased step length - right, Decreased step length - left, Shuffle, Trunk flexed Gait velocity: decr     General Gait Details: min cues for posture and position from [employer]             Wheelchair Mobility    Modified Rankin (Stroke Patients Only)       Balance Overall balance assessment: Needs assistance Sitting-balance support: No upper extremity supported, Feet supported Sitting balance-Leahy Scale: Good     Standing balance support: No upper extremity supported Standing balance-Leahy Scale: Fair                              Cognition Arousal/Alertness: Awake/alert Behavior During Therapy: WFL for tasks assessed/performed, Impulsive Overall Cognitive Status: Within Functional Limits for tasks assessed                                          Exercises      General Comments        Pertinent Vitals/Pain Pain Assessment Pain Assessment: 0-10 Pain Score: 3  Pain Location: R hip Pain Descriptors / Indicators: Aching, Sore Pain Intervention(s): Limited activity within patient's tolerance, Monitored during session, Premedicated before session, Ice applied    Home Living  Prior Function            PT Goals (current goals can now be found in the care plan section) Acute Rehab PT Goals Patient Stated Goal: Regain IND PT Goal Formulation: With patient Time For Goal Achievement: 07/25/22 Potential to Achieve Goals: Good Progress towards PT goals: Progressing toward goals    Frequency    7X/week      PT Plan Current plan remains appropriate    Co-evaluation              AM-PAC PT "6 Clicks" Mobility   Outcome Measure  Help needed turning from your back to your side while in a flat  bed without using bedrails?: A Little Help needed moving from lying on your back to sitting on the side of a flat bed without using bedrails?: A Little Help needed moving to and from a bed to a chair (including a wheelchair)?: A Little Help needed standing up from a chair using your arms (e.g., wheelchair or bedside chair)?: A Little Help needed to walk in hospital room?: A Little Help needed climbing 3-5 steps with a railing? : A Lot 6 Click Score: 17    End of Session Equipment Utilized During Treatment: Gait belt Activity Tolerance: Patient tolerated treatment well Patient left: Other (comment) (bathroom) Nurse Communication: Mobility status PT Visit Diagnosis: Unsteadiness on feet (R26.81);Difficulty in walking, not elsewhere classified (R26.2)     Time: 4765-4650 PT Time Calculation (min) (ACUTE ONLY): 26 min  Charges:  $Gait Training: 23-37 mins                     [place] Pager [phone] Office [phone]    [place] 07/20/2022, 1:56 PM

## 2022-07-21 DIAGNOSIS — I480 Paroxysmal atrial fibrillation: Secondary | ICD-10-CM

## 2022-07-21 DIAGNOSIS — R410 Disorientation, unspecified: Secondary | ICD-10-CM | POA: Diagnosis not present

## 2022-07-21 DIAGNOSIS — S72001A Fracture of unspecified part of neck of right femur, initial encounter for closed fracture: Secondary | ICD-10-CM | POA: Diagnosis not present

## 2022-07-21 LAB — GLUCOSE, CAPILLARY: Glucose-Capillary: 175 mg/dL — ABNORMAL HIGH (ref 70–99)

## 2022-07-21 MED ORDER — ACETAMINOPHEN 325 MG PO TABS: 650.0000 mg | ORAL_TABLET | ORAL | Status: DC | PRN

## 2022-07-21 NOTE — Progress Notes (Signed)
Physical Therapy Treatment Patient Details Name: Taylor Ochoa MRN: [id number] DOB: 1956-01-01 Today's Date: 07/21/2022   History of Present Illness Pt s/p fall and now s/p R THR by anterior direct approach.  Pt with hx of COPD    PT Comments    Pt continues very motivated and progressing well with mobility including increased distance ambulated, improved stability with gait, and decreased impulsivity with improved safety awareness.   Recommendations for follow up therapy are one component of a multi-disciplinary discharge planning process, led by the attending physician.  Recommendations may be updated based on patient status, additional functional criteria and insurance authorization.  Follow Up Recommendations  Skilled nursing-short term rehab (<3 hours/day) Can patient physically be transported by private vehicle: Yes   Assistance Recommended at Discharge Intermittent Supervision/Assistance  Patient can return home with the following A little help with walking and/or transfers;A little help with bathing/dressing/bathroom;Assistance with cooking/housework;Assist for transportation;Help with stairs or ramp for entrance   Equipment Recommendations  None recommended by PT    Recommendations for Other Services       Precautions / Restrictions Precautions Precautions: Fall Restrictions Weight Bearing Restrictions: No RLE Weight Bearing: Weight bearing as tolerated     Mobility  Bed Mobility               General bed mobility comments: Pt up in chair and requests back to same    Transfers Overall transfer level: Needs assistance Equipment used: Rolling walker (2 wheels) Transfers: Sit to/from Stand Sit to Stand: Min guard, Supervision           General transfer comment: min cues for LE management and use of UEs to self assist    Ambulation/Gait Ambulation/Gait assistance: Min guard, Supervision Gait Distance (Feet): 100 Feet (100' twice) Assistive device:  Rolling walker (2 wheels) Gait Pattern/deviations: Decreased step length - right, Decreased step length - left, Shuffle, Trunk flexed, Step-through pattern Gait velocity: decr     General Gait Details: min cues for posture and position from RW   Stairs Stairs: Yes Stairs assistance: Min assist Stair Management: Step to pattern, Two rails, Forwards Number of Stairs: 8 General stair comments: 4 steps twice with cues for sequence   Wheelchair Mobility    Modified Rankin (Stroke Patients Only)       Balance Overall balance assessment: Needs assistance Sitting-balance support: No upper extremity supported, Feet supported Sitting balance-Leahy Scale: Good     Standing balance support: No upper extremity supported Standing balance-Leahy Scale: Fair                              Cognition Arousal/Alertness: Awake/alert Behavior During Therapy: WFL for tasks assessed/performed, Impulsive Overall Cognitive Status: Within Functional Limits for tasks assessed                                          Exercises      General Comments        Pertinent Vitals/Pain Pain Assessment Pain Assessment: 0-10 Pain Score: 2  Pain Location: R hip Pain Descriptors / Indicators: Aching, Sore Pain Intervention(s): Limited activity within patient's tolerance, Monitored during session, Premedicated before session, Ice applied    Home Living  Prior Function            PT Goals (current goals can now be found in the care plan section) Acute Rehab PT Goals Patient Stated Goal: Regain IND PT Goal Formulation: With patient Time For Goal Achievement: 07/25/22 Potential to Achieve Goals: Good Progress towards PT goals: Progressing toward goals    Frequency    7X/week      PT Plan Current plan remains appropriate    Co-evaluation              AM-PAC PT "6 Clicks" Mobility   Outcome Measure  Help needed  turning from your back to your side while in a flat bed without using bedrails?: A Little Help needed moving from lying on your back to sitting on the side of a flat bed without using bedrails?: A Little Help needed moving to and from a bed to a chair (including a wheelchair)?: A Little Help needed standing up from a chair using your arms (e.g., wheelchair or bedside chair)?: A Little Help needed to walk in hospital room?: A Little Help needed climbing 3-5 steps with a railing? : A Little 6 Click Score: 18    End of Session Equipment Utilized During Treatment: Gait belt Activity Tolerance: Patient tolerated treatment well Patient left: in chair;with call bell/phone within reach;with chair alarm set Nurse Communication: Mobility status PT Visit Diagnosis: Difficulty in walking, not elsewhere classified (R26.2)     Time: 4503-8882 PT Time Calculation (min) (ACUTE ONLY): 24 min  Charges:  $Gait Training: 23-37 mins                     [place] Pager [phone] Office [phone]    Osiris Charles 07/21/2022, 3:58 PM

## 2022-07-21 NOTE — Plan of Care (Signed)
Plan of care reviewed and discussed. °

## 2022-07-21 NOTE — Progress Notes (Signed)
Progress Note    ICHAEL Ochoa   WCB:[id number]  DOB: [birth year]  DOA: 07/14/2022     7 PCP: Janith Lima, MD  Initial CC: fall  Hospital Course: [name] is a 66  yo male with PMH HLD, COPD, depression, GERD who presented after a mechanical fall.  He landed on his right side and was brought to the ER for evaluation. Pelvis imaging revealed acute right femoral neck fracture.  He was evaluated by orthopedic surgery and underwent right total hip replacement on 07/16/2022.  Interval History:  No events overnight.  Doing well this morning and eating breakfast.  Plan remains for rehab on Monday. Pain controlled.  Ambulating with therapy.  Assessment and Plan:  Acute right femoral neck fracture - s/p mechanical fall -Underwent right total hip replacement with orthopedic surgery on 07/16/2022 - Follow-up assessment with PT; plans are for SNF; [facility] Monday  Atrial fib with RVR -resolved - situational and no known history; episode last very briefly on 10/25 - s/p dosing of lopressor; will discontinue for now and monitor off - will obtain echo if recurs and start further workup  -Remains in NSR.  Okay to DC telemetry  Acute delirium -resolved - Not unexpected given age, surgery, and hospitalization - Continue delirium precautions   Leukocytosis: Resolved -Considered reactive   Depression: Continue Zoloft   GERD: Continue Protonix   Hyperlipidemia: Continue Crestor.   Old records reviewed in assessment of this patient  Antimicrobials:   DVT prophylaxis:  SCDs Start: 07/16/22 1445 Place TED hose Start: 07/16/22 1445 SCDs Start: 07/14/22 2315   Code Status:   Code Status: Full Code  Mobility Assessment (last 72 hours)     Mobility Assessment     Row Name 07/21/22 0810 07/20/22 1949 07/20/22 1500 07/20/22 1300 07/20/22 0718   Does patient have an order for bedrest or is patient medically unstable -- No - Continue assessment -- -- No - Continue  assessment   What is the highest level of mobility based on the progressive mobility assessment? Level 5 (Walks with assist in room/hall) - Balance while stepping forward/back and can walk in room with assist - Complete Level 5 (Walks with assist in room/hall) - Balance while stepping forward/back and can walk in room with assist - Complete Level 5 (Walks with assist in room/hall) - Balance while stepping forward/back and can walk in room with assist - Complete Level 5 (Walks with assist in room/hall) - Balance while stepping forward/back and can walk in room with assist - Complete Level 5 (Walks with assist in room/hall) - Balance while stepping forward/back and can walk in room with assist - Complete   Is the above level different from baseline mobility prior to current illness? -- Yes - Recommend PT order -- -- Yes - Recommend PT order    Row Name 07/19/22 1934 07/19/22 1600 07/19/22 1200 07/19/22 0727     Does patient have an order for bedrest or is patient medically unstable No - Continue assessment -- -- No - Continue assessment    What is the highest level of mobility based on the progressive mobility assessment? Level 5 (Walks with assist in room/hall) - Balance while stepping forward/back and can walk in room with assist - Complete Level 5 (Walks with assist in room/hall) - Balance while stepping forward/back and can walk in room with assist - Complete Level 5 (Walks with assist in room/hall) - Balance while stepping forward/back and can walk in room with assist -  Complete Level 5 (Walks with assist in room/hall) - Balance while stepping forward/back and can walk in room with assist - Complete    Is the above level different from baseline mobility prior to current illness? Yes - Recommend PT order -- -- Yes - Recommend PT order             Barriers to discharge:  Disposition Plan:  [place] Status is: Inpt  Objective: Blood pressure 131/77, pulse 88, temperature 98.4 F (36.9  C), resp. rate 18, height '5\' 7"'$  (1.702 m), weight 65.5 kg, SpO2 98 %.  Examination:  Physical Exam Constitutional:      General: He is not in acute distress.    Appearance: Normal appearance.  HENT:     Head: Normocephalic and atraumatic.     Mouth/Throat:     Mouth: Mucous membranes are moist.  Eyes:     Extraocular Movements: Extraocular movements intact.  Cardiovascular:     Rate and Rhythm: Normal rate and regular rhythm.     Heart sounds: Normal heart sounds.  Pulmonary:     Effort: Pulmonary effort is normal. No respiratory distress.     Breath sounds: Normal breath sounds. No wheezing.  Abdominal:     General: Bowel sounds are normal. There is no distension.     Palpations: Abdomen is soft.     Tenderness: There is no abdominal tenderness.  Musculoskeletal:        General: Normal range of motion.     Cervical back: Normal range of motion and neck supple.     Comments: Surgical dressing intact in right hip.  Compartments soft  Skin:    General: Skin is warm and dry.  Neurological:     General: No focal deficit present.     Mental Status: He is alert. Mental status is at baseline.  Psychiatric:        Mood and Affect: Mood normal.        Behavior: Behavior normal.     Consultants:  Orthopedic surgery  Procedures:  07/16/2022: Right total hip replacement  Data Reviewed: Results for orders placed or performed during the hospital encounter of 07/14/22 (from the past 24 hour(s))  Glucose, capillary     Status: Abnormal   Collection Time: 07/21/22  2:18 AM  Result Value Ref Range   Glucose-Capillary 175 (H) 70 - 99 mg/dL     I have Reviewed nursing notes, Vitals, and Lab results since pt's last encounter. Pertinent lab results : see above I have reviewed the last note from staff over past 24 hours I have discussed pt's care plan and test results with nursing staff, case manager   LOS: 7 days   Dwyane Dee, MD [facility] 07/21/2022, 3:32 PM

## 2022-07-21 NOTE — Progress Notes (Signed)
Orthopedics Progress Note  Subjective: Patient feeling better today. He is looking forward to SNF transfer tomorrow.  Pain is controlled.   Objective:  Vitals:   07/20/22 2201 07/21/22 0531  BP: 131/81 131/75  Pulse: 80 71  Resp: 17 18  Temp: 98.1 F (36.7 C) 98.5 F (36.9 C)  SpO2: 97% 97%    General: Awake and alert  Musculoskeletal: Right hip dressing CDI. Minimal leg/thigh swelling. No pain with AROM of the right ankle with good strength Neurovascularly intact  Lab Results  Component Value Date   WBC 10.1 07/19/2022   HGB 11.3 (L) 07/19/2022   HCT 33.5 (L) 07/19/2022   MCV 97.7 07/19/2022   PLT 194 07/19/2022       Component Value Date/Time   NA 141 07/19/2022 0330   NA 141 07/04/2016 1115   K 3.9 07/19/2022 0330   CL 107 07/19/2022 0330   CO2 26 07/19/2022 0330   GLUCOSE 126 (H) 07/19/2022 0330   BUN 29 (H) 07/19/2022 0330   BUN 24 07/04/2016 1115   CREATININE 0.75 07/19/2022 0330   CREATININE 0.82 12/28/2014 1610   CALCIUM 8.4 (L) 07/19/2022 0330   GFRNONAA >60 07/19/2022 0330   GFRNONAA >89 12/28/2014 1610   GFRAA >60 01/20/2018 0555   GFRAA >89 12/28/2014 1610    Lab Results  Component Value Date   INR 1.0 07/14/2022   INR 1.0 12/03/2021   INR 1.13 01/13/2018    Assessment/Plan: s/p Procedure(s): TOTAL HIP ARTHROPLASTY ANTERIOR APPROACH Stable from ortho standpoint. Continue mobilization and discharge planning for SNF tomorrow.  DVT prophylaxis ASA plus mechanical  Doran Heater. Veverly Fells, MD 07/21/2022 1:17 PM

## 2022-07-21 NOTE — Progress Notes (Signed)
Physical Therapy Treatment Patient Details Name: Taylor Ochoa MRN: [id number] DOB: 01/16/1956 Today's Date: 07/21/2022   History of Present Illness Pt s/p fall and now s/p R THR by anterior direct approach.  Pt with hx of COPD    PT Comments    Pt continues motivated and up to perform first session standing therex - mild difficulty but good performance overall and good balance with RW.  Pt hopeful for dc tomorrow.  Recommendations for follow up therapy are one component of a multi-disciplinary discharge planning process, led by the attending physician.  Recommendations may be updated based on patient status, additional functional criteria and insurance authorization.  Follow Up Recommendations  Skilled nursing-short term rehab (<3 hours/day) Can patient physically be transported by private vehicle: Yes   Assistance Recommended at Discharge Intermittent Supervision/Assistance  Patient can return home with the following A little help with walking and/or transfers;A little help with bathing/dressing/bathroom;Assistance with cooking/housework;Assist for transportation;Help with stairs or ramp for entrance   Equipment Recommendations  None recommended by PT    Recommendations for Other Services       Precautions / Restrictions Precautions Precautions: Fall Restrictions Weight Bearing Restrictions: No RLE Weight Bearing: Weight bearing as tolerated     Mobility  Bed Mobility               General bed mobility comments: Pt up in chair and requests back to same    Transfers Overall transfer level: Needs assistance Equipment used: Rolling walker (2 wheels) Transfers: Sit to/from Stand Sit to Stand: Supervision           General transfer comment: min cues for LE management and use of UEs to self assist    Ambulation/Gait Ambulation/Gait assistance: Min guard, Supervision Gait Distance (Feet): 100 Feet (100' twice) Assistive device: Rolling walker (2  wheels) Gait Pattern/deviations: Decreased step length - right, Decreased step length - left, Shuffle, Trunk flexed, Step-through pattern Gait velocity: decr     General Gait Details: therex only   Stairs Stairs: Yes Stairs assistance: Min assist Stair Management: Step to pattern, Two rails, Forwards Number of Stairs: 8 General stair comments: 4 steps twice with cues for sequence   Wheelchair Mobility    Modified Rankin (Stroke Patients Only)       Balance Overall balance assessment: Needs assistance Sitting-balance support: No upper extremity supported, Feet supported Sitting balance-Leahy Scale: Good     Standing balance support: No upper extremity supported Standing balance-Leahy Scale: Fair                              Cognition Arousal/Alertness: Awake/alert Behavior During Therapy: WFL for tasks assessed/performed, Impulsive Overall Cognitive Status: Within Functional Limits for tasks assessed                                          Exercises Total Joint Exercises Ankle Circles/Pumps: AROM, Both, 15 reps, Supine Hip ABduction/ADduction: AAROM, Right, 20 reps, Standing Long Arc Quad: AROM, Right, 15 reps, Seated Knee Flexion: AROM, Right, 10 reps, Standing Marching in Standing: AROM, Right, 20 reps, Standing Standing Hip Extension: AROM, Right, 15 reps, Standing    General Comments        Pertinent Vitals/Pain Pain Assessment Pain Assessment: 0-10 Pain Score: 4  Pain Location: R hip Pain Descriptors / Indicators: Aching, Sore, Burning Pain  Intervention(s): Limited activity within patient's tolerance, Monitored during session, Premedicated before session, Ice applied    Home Living                          Prior Function            PT Goals (current goals can now be found in the care plan section) Acute Rehab PT Goals Patient Stated Goal: Regain IND PT Goal Formulation: With patient Time For Goal  Achievement: 07/25/22 Potential to Achieve Goals: Good Progress towards PT goals: Progressing toward goals    Frequency    7X/week      PT Plan Current plan remains appropriate    Co-evaluation              AM-PAC PT "6 Clicks" Mobility   Outcome Measure  Help needed turning from your back to your side while in a flat bed without using bedrails?: A Little Help needed moving from lying on your back to sitting on the side of a flat bed without using bedrails?: A Little Help needed moving to and from a bed to a chair (including a wheelchair)?: A Little Help needed standing up from a chair using your arms (e.g., wheelchair or bedside chair)?: A Little Help needed to walk in hospital room?: A Little Help needed climbing 3-5 steps with a railing? : A Little 6 Click Score: 18    End of Session Equipment Utilized During Treatment: Gait belt Activity Tolerance: Patient tolerated treatment well Patient left: in chair;with call bell/phone within reach;with chair alarm set Nurse Communication: Mobility status PT Visit Diagnosis: Difficulty in walking, not elsewhere classified (R26.2)     Time: 2637-8588 PT Time Calculation (min) (ACUTE ONLY): 17 min  Charges:  $Gait Training: 23-37 mins $Therapeutic Exercise: 8-22 mins                     [place] Pager [phone] Office [phone]    Khalfani Weideman 07/21/2022, 4:03 PM

## 2022-07-22 ENCOUNTER — Other Ambulatory Visit (HOSPITAL_COMMUNITY): Payer: Self-pay

## 2022-07-22 DIAGNOSIS — F32A Depression, unspecified: Secondary | ICD-10-CM | POA: Diagnosis not present

## 2022-07-22 DIAGNOSIS — R296 Repeated falls: Secondary | ICD-10-CM | POA: Diagnosis not present

## 2022-07-22 DIAGNOSIS — W19XXXD Unspecified fall, subsequent encounter: Secondary | ICD-10-CM | POA: Diagnosis not present

## 2022-07-22 DIAGNOSIS — I480 Paroxysmal atrial fibrillation: Secondary | ICD-10-CM | POA: Diagnosis not present

## 2022-07-22 DIAGNOSIS — K5900 Constipation, unspecified: Secondary | ICD-10-CM | POA: Diagnosis not present

## 2022-07-22 DIAGNOSIS — E7800 Pure hypercholesterolemia, unspecified: Secondary | ICD-10-CM | POA: Diagnosis not present

## 2022-07-22 DIAGNOSIS — N401 Enlarged prostate with lower urinary tract symptoms: Secondary | ICD-10-CM | POA: Diagnosis not present

## 2022-07-22 DIAGNOSIS — S72001A Fracture of unspecified part of neck of right femur, initial encounter for closed fracture: Secondary | ICD-10-CM | POA: Diagnosis not present

## 2022-07-22 DIAGNOSIS — K219 Gastro-esophageal reflux disease without esophagitis: Secondary | ICD-10-CM | POA: Diagnosis not present

## 2022-07-22 DIAGNOSIS — Z720 Tobacco use: Secondary | ICD-10-CM | POA: Diagnosis not present

## 2022-07-22 DIAGNOSIS — Z7982 Long term (current) use of aspirin: Secondary | ICD-10-CM | POA: Diagnosis not present

## 2022-07-22 DIAGNOSIS — Z96641 Presence of right artificial hip joint: Secondary | ICD-10-CM | POA: Diagnosis not present

## 2022-07-22 DIAGNOSIS — S72001D Fracture of unspecified part of neck of right femur, subsequent encounter for closed fracture with routine healing: Secondary | ICD-10-CM | POA: Diagnosis not present

## 2022-07-22 DIAGNOSIS — J449 Chronic obstructive pulmonary disease, unspecified: Secondary | ICD-10-CM | POA: Diagnosis not present

## 2022-07-22 DIAGNOSIS — R4182 Altered mental status, unspecified: Secondary | ICD-10-CM | POA: Diagnosis not present

## 2022-07-22 MED ORDER — FERROUS SULFATE 325 (65 FE) MG PO TABS: 325.0000 mg | ORAL_TABLET | Freq: Every day | ORAL | 3 refills | Status: DC

## 2022-07-22 MED ORDER — ACETAMINOPHEN 325 MG PO TABS: 650.0000 mg | ORAL_TABLET | ORAL | Status: DC | PRN

## 2022-07-22 MED ORDER — SENNOSIDES-DOCUSATE SODIUM 8.6-50 MG PO TABS: 1.0000 | ORAL_TABLET | Freq: Two times a day (BID) | ORAL | Status: DC | PRN

## 2022-07-22 MED ORDER — ASPIRIN 81 MG PO CHEW: 81.0000 mg | CHEWABLE_TABLET | Freq: Two times a day (BID) | ORAL | 0 refills | Status: AC

## 2022-07-22 MED ORDER — POLYETHYLENE GLYCOL 3350 17 G PO PACK
17.0000 g | PACK | Freq: Every day | ORAL | 0 refills | Status: DC
  Filled 2022-07-22: qty 14, 14d supply, fill #0

## 2022-07-22 MED ORDER — OXYCODONE HCL 5 MG PO TABS: 5.0000 mg | ORAL_TABLET | ORAL | 0 refills | Status: DC | PRN

## 2022-07-22 NOTE — Care Management Important Message (Signed)
Important Message  Patient Details IM Letter given Name: Taylor Ochoa MRN: [id number] Date of Birth: Sep 15, 1956   Medicare Important Message Given:  Yes     Kerin Salen 07/22/2022, 11:24 AM

## 2022-07-22 NOTE — TOC Transition Note (Signed)
Transition of Care [facility]) - CM/SW Discharge Note   Patient Details  Name: Taylor Ochoa MRN: [id number] Date of Birth: 06/14/1956  Transition of [facility]) CM/SW Contact:  Lennart Pall, LCSW Phone Number: 07/22/2022, 11:28 AM   Clinical Narrative:    Pt medically cleared for dc today and have received insurance authorization for SNF bed at [facility].  Daughter will provide the dc transportation.  RN to call report to [phone].  No further TOC needs.   Final next level of care: Skilled Nursing Facility Barriers to Discharge: Barriers Resolved   Patient Goals and CMS Choice Patient states their goals for this hospitalization and ongoing recovery are:: return home following SNF rehab CMS Medicare.gov Compare Post Acute Care list provided to:: Patient Choice offered to / list presented to : Patient  Discharge Placement   Existing PASRR number confirmed : 07/18/22          Patient [facility] bed at: [facility] Patient to be transferred to facility by: daughter to transport Name of family member notified: daughter, Drue Dun Patient and family notified of of transfer: 07/22/22  Discharge Plan and Services In-house Referral: Clinical Social Work   Post Acute Care Choice: [facility]          DME Arranged: N/A DME Agency: NA                  Social Determinants of Health (SDOH) Interventions Food Insecurity Interventions: Intervention Not Indicated Housing Interventions: Intervention Not Indicated Transportation Interventions: Intervention Not Indicated Utilities Interventions: Intervention Not Indicated   Readmission Risk Interventions    07/18/2022    3:57 PM  Readmission Risk Prevention Plan  Post Dischage Appt Complete  Medication Screening Complete  Transportation Screening Complete

## 2022-07-22 NOTE — Discharge Summary (Signed)
Physician Discharge Summary   Taylor Ochoa FVC:[id number] DOB: 1956/02/17 DOA: 07/14/2022  PCP: Janith Lima, MD  Admit date: 07/14/2022 Discharge date: 07/22/2022 Barriers to discharge: Awaiting SNF over weekend  Admitted From: Home Disposition:  [facility] Discharging physician: Dwyane Dee, MD  Recommendations for Outpatient Follow-up:  Follow up with orthopedic surgery  Home Health:  Equipment/Devices:   Discharge Condition: stable CODE STATUS: Full Diet recommendation:  Diet Orders (From admission, onward)     Start     Ordered   07/22/22 0000  Diet general        07/22/22 1103   07/16/22 1445  Diet regular Room service appropriate? Yes; Fluid consistency: Thin  Diet effective now       Question Answer Comment  Room service appropriate? Yes   Fluid consistency: Thin      07/16/22 1444            Hospital Course: Taylor Ochoa is a 66  yo male with PMH HLD, COPD, depression, GERD who presented after a mechanical fall.  He landed on his right side and was brought to the ER for evaluation. Pelvis imaging revealed acute right femoral neck fracture.  He was evaluated by orthopedic surgery and underwent right total hip replacement on 07/16/2022.  Assessment and Plan:  Acute right femoral neck fracture - s/p mechanical fall -Underwent right total hip replacement with orthopedic surgery on 07/16/2022 - s/p PT eval; d/c to [facility] - follow up with orthopedic surgery - continue pain control - continue asa for DVT ppx   Atrial fib with RVR -resolved - situational and no known history; episode last very briefly on 10/25 - s/p dosing of lopressor; will discontinue for now and monitor off; no further recurrence of afib; hold off on any further workup unless were to recur -Remains in NSR.  Okay to DC telemetry   Acute delirium -resolved - Not unexpected given age, surgery, and hospitalization - Continue delirium precautions   Leukocytosis:  Resolved -Considered reactive   Depression: Continue Zoloft   GERD: Continue Protonix   Hyperlipidemia: Continue Crestor.  Principal Diagnosis: Closed fracture of neck of right femur [facility])  Discharge Diagnoses: Active Hospital Problems   Diagnosis Date Noted   Closed fracture of neck of right femur ([place]) 07/14/2022    Priority: Low   Paroxysmal atrial fibrillation with RVR ([place]) 07/21/2022   Delirium 07/21/2022   S/P total right hip arthroplasty 07/16/2022   Fall 07/15/2022   COPD (chronic obstructive pulmonary disease) ([place]) 07/15/2022   Hyperlipidemia with target LDL less than 130 04/08/2019   Leukocytosis    GERD (gastroesophageal reflux disease) 02/22/2014   Depression 04/20/2010    Resolved Hospital Problems  No resolved problems to display.     Discharge Instructions     Diet general   Complete by: As directed    Increase activity slowly   Complete by: As directed    Leave dressing on - Keep it clean, dry, and intact until clinic visit   Complete by: As directed       Allergies as of 07/22/2022   No Known Allergies      Medication List     STOP taking these medications    tamsulosin 0.4 MG Caps capsule Commonly known as: FLOMAX       TAKE these medications    acetaminophen 325 MG tablet Commonly known as: TYLENOL Take 2 tablets (650 mg total) by mouth every 4 (four) hours as needed for mild pain  or moderate pain (or Fever >/= 101). What changed:  medication strength how much to take when to take this reasons to take this   aspirin 81 MG chewable tablet Chew 1 tablet (81 mg total) by mouth 2 (two) times daily for 21 days.   ferrous sulfate 325 (65 FE) MG tablet Take 1 tablet (325 mg total) by mouth daily with breakfast.   gabapentin 600 MG tablet Commonly known as: NEURONTIN Take 1 tablet (600 mg total) by mouth 3 (three) times daily.   oxyCODONE 5 MG immediate release tablet Commonly known as: Roxicodone Take 1 tablet (5 mg total)  by mouth every 4 (four) hours as needed.   pantoprazole 40 MG tablet Commonly known as: PROTONIX Take 1 tablet (40 mg total) by mouth daily.   polyethylene glycol 17 g packet Commonly known as: MIRALAX / GLYCOLAX Take 17 g by mouth daily.   rosuvastatin 20 MG tablet Commonly known as: CRESTOR Take 1 tablet (20 mg total) by mouth daily.   senna-docusate 8.6-50 MG tablet Commonly known as: Senokot-S Take 1 tablet by mouth 2 (two) times daily as needed for mild constipation.   sertraline 100 MG tablet Commonly known as: ZOLOFT Take 1 tablet (100 mg total) by mouth daily.   Spiriva Respimat 1.25 MCG/ACT Aers Generic drug: Tiotropium Bromide Monohydrate Inhale 2 puffs into the lungs daily.               Discharge Care Instructions  (From admission, onward)           Start     Ordered   07/22/22 0000  Leave dressing on - Keep it clean, dry, and intact until clinic visit        07/22/22 1103            Contact information for follow-up providers     Paralee Cancel, MD. Schedule an appointment as soon as possible for a visit in 2 week(s).   Specialty: Orthopedic Surgery Contact information: [address] [place] [address] [phone]              Contact information for after-discharge care     [facility] Preferred SNF .   Service: Skilled Nursing Contact information: [place] [phone]                    No Known Allergies  Consultations: Orthopedic surgery  Procedures: 07/16/2022: Right total hip replacement  Discharge Exam: BP 123/75 (BP Location: Left Arm)   Pulse 73   Temp 98.1 F (36.7 C) (Oral)   Resp 18   Ht '5\' 7"'$  (1.702 m)   Wt 65.5 kg   SpO2 96%   BMI 22.62 kg/m  Physical Exam Constitutional:      General: He is not in acute distress.    Appearance:  Normal appearance.  HENT:     Head: Normocephalic and atraumatic.     Mouth/Throat:     Mouth: Mucous membranes are moist.  Eyes:     Extraocular Movements: Extraocular movements intact.  Cardiovascular:     Rate and Rhythm: Normal rate and regular rhythm.     Heart sounds: Normal heart sounds.  Pulmonary:     Effort: Pulmonary effort is normal. No respiratory distress.     Breath sounds: Normal breath sounds. No wheezing.  Abdominal:     General: Bowel  sounds are normal. There is no distension.     Palpations: Abdomen is soft.     Tenderness: There is no abdominal tenderness.  Musculoskeletal:        General: Normal range of motion.     Cervical back: Normal range of motion and neck supple.     Comments: Surgical dressing intact in right hip.  Compartments soft  Skin:    General: Skin is warm and dry.  Neurological:     General: No focal deficit present.     Mental Status: He is alert. Mental status is at baseline.  Psychiatric:        Mood and Affect: Mood normal.        Behavior: Behavior normal.      The results of significant diagnostics from this hospitalization (including imaging, microbiology, ancillary and laboratory) are listed below for reference.   Microbiology: Recent Results (from the past 240 hour(s))  Surgical pcr screen     Status: None   Collection Time: 07/15/22  2:00 PM   Specimen: Nasal Mucosa; Nasal Swab  Result Value Ref Range Status   MRSA, PCR NEGATIVE NEGATIVE Final   Staphylococcus aureus NEGATIVE NEGATIVE Final    Comment: (NOTE) The Xpert SA Assay (FDA approved for NASAL specimens in patients 59 years of age and older), is one component of a comprehensive surveillance program. It is not intended to diagnose infection nor to guide or monitor treatment. Performed at [facility], [place] [address], [place], [place] [address]      Labs: BNP (last 3 results) No results for input(s): "BNP" in the last 8760 hours. Basic  Metabolic Panel: Recent Labs  Lab 07/17/22 0315 07/18/22 0317 07/19/22 0330  NA 137 140 141  K 3.6 3.6 3.9  CL 104 106 107  CO2 '24 27 26  '$ GLUCOSE 139* 144* 126*  BUN 24* 29* 29*  CREATININE 0.78 0.79 0.75  CALCIUM 8.3* 8.4* 8.4*  MG 1.9 2.2 2.1   Liver Function Tests: No results for input(s): "AST", "ALT", "ALKPHOS", "BILITOT", "PROT", "ALBUMIN" in the last 168 hours. No results for input(s): "LIPASE", "AMYLASE" in the last 168 hours. No results for input(s): "AMMONIA" in the last 168 hours. CBC: Recent Labs  Lab 07/16/22 0325 07/17/22 0315 07/18/22 0317 07/19/22 0330  WBC 11.9* 12.5* 10.7* 10.1  NEUTROABS 10.1* 10.6* 8.5* 7.3  HGB 14.1 11.8* 11.0* 11.3*  HCT 41.9 34.6* 33.5* 33.5*  MCV 97.4 96.9 99.7 97.7  PLT 193 185 186 194   Cardiac Enzymes: No results for input(s): "CKTOTAL", "CKMB", "CKMBINDEX", "TROPONINI" in the last 168 hours. BNP: Invalid input(s): "POCBNP" CBG: Recent Labs  Lab 07/21/22 0218  GLUCAP 175*   D-Dimer No results for input(s): "DDIMER" in the last 72 hours. Hgb A1c No results for input(s): "HGBA1C" in the last 72 hours. Lipid Profile No results for input(s): "CHOL", "HDL", "LDLCALC", "TRIG", "CHOLHDL", "LDLDIRECT" in the last 72 hours. Thyroid function studies No results for input(s): "TSH", "T4TOTAL", "T3FREE", "THYROIDAB" in the last 72 hours.  Invalid input(s): "FREET3" Anemia work up No results for input(s): "VITAMINB12", "FOLATE", "FERRITIN", "TIBC", "IRON", "RETICCTPCT" in the last 72 hours. Urinalysis    Component Value Date/Time   COLORURINE YELLOW (A) 12/03/2021 2234   APPEARANCEUR CLEAR (A) 12/03/2021 2234   APPEARANCEUR Clear 10/24/2016 1438   LABSPEC 1.010 12/03/2021 2234   PHURINE 5.0 12/03/2021 2234   GLUCOSEU 50 (A) 12/03/2021 2234   GLUCOSEU NEGATIVE 11/14/2021 0902   HGBUR NEGATIVE 12/03/2021 2234  BILIRUBINUR NEGATIVE 12/03/2021 2234   BILIRUBINUR Negative 10/24/2016 1438   KETONESUR NEGATIVE 12/03/2021  2234   PROTEINUR NEGATIVE 12/03/2021 2234   UROBILINOGEN 1.0 11/14/2021 0902   NITRITE NEGATIVE 12/03/2021 2234   LEUKOCYTESUR NEGATIVE 12/03/2021 2234   Sepsis Labs Recent Labs  Lab 07/16/22 0325 07/17/22 0315 07/18/22 0317 07/19/22 0330  WBC 11.9* 12.5* 10.7* 10.1   Microbiology Recent Results (from the past 240 hour(s))  Surgical pcr screen     Status: None   Collection Time: 07/15/22  2:00 PM   Specimen: Nasal Mucosa; Nasal Swab  Result Value Ref Range Status   MRSA, PCR NEGATIVE NEGATIVE Final   Staphylococcus aureus NEGATIVE NEGATIVE Final    Comment: (NOTE) The Xpert SA Assay (FDA approved for NASAL specimens in patients 37 years of age and older), is one component of a comprehensive surveillance program. It is not intended to diagnose infection nor to guide or monitor treatment. Performed at [facility], [place] [address], [place], [place] [address]     Procedures/Studies: DG CHEST PORT 1 VIEW  Result Date: 07/17/2022 CLINICAL DATA:  Respiratory failure. EXAM: PORTABLE CHEST 1 VIEW COMPARISON:  07/14/2022 FINDINGS: 1542 hours. The cardio pericardial silhouette is enlarged. Interstitial markings are diffusely coarsened with chronic features. No focal consolidation or pleural effusion. The visualized bony structures of the thorax are unremarkable. Telemetry leads overlie the chest. IMPRESSION: Chronic interstitial coarsening. No acute cardiopulmonary findings. Electronically Signed   By: Misty Stanley M.D.   On: 07/17/2022 16:15   DG Pelvis Portable  Result Date: 07/16/2022 CLINICAL DATA:  Post right total hip replacement. EXAM: PORTABLE PELVIS 1-2 VIEWS COMPARISON:  Intraoperative radiographs of the right hip-earlier same day Right femur radiographs-07/14/2022 FINDINGS: Post right total hip replacement. Alignment appears anatomic given AP projection. Expected adjacent subcutaneous emphysema. No radiopaque foreign body. Sequela of prior cancellous  screw fixation of the right superior pubic ramus as well as cancellous screw fixation of the right SI joint, incompletely evaluated. Old fractures of the bilateral inferior pubic rami, unchanged. IMPRESSION: Post right total hip replacement without evidence of complication. Electronically Signed   By: Sandi Mariscal M.D.   On: 07/16/2022 14:13   DG C-Arm 1-60 Min-No Report  Result Date: 07/16/2022 CLINICAL DATA:  Post right total hip replacement EXAM: DG HIP (WITH OR WITHOUT PELVIS) 2-3V RIGHT; DG C-ARM 1-60 MIN-NO REPORT FLUOROSCOPY TIME: FLUOROSCOPY TIME 12 seconds (1.1 mGy) COMPARISON:  Right femur radiographs-07/14/2022 FINDINGS: Twelve spot fluoroscopic images of the right hip and lower pelvis are provided for review and demonstrate the sequela ongoing right total hip replacement. Alignment appears anatomic. Redemonstrated sequela of previous lag screw fixation of the right superior pubic ramus as well as the right SI joint, incompletely evaluated. Expected adjacent subcutaneous emphysema. No radiopaque foreign body. IMPRESSION: Post right total hip replacement without evidence of complication. Electronically Signed   By: Sandi Mariscal M.D.   On: 07/16/2022 13:43   DG HIP UNILAT WITH PELVIS 2-3 VIEWS RIGHT  Result Date: 07/16/2022 CLINICAL DATA:  Post right total hip replacement EXAM: DG HIP (WITH OR WITHOUT PELVIS) 2-3V RIGHT; DG C-ARM 1-60 MIN-NO REPORT FLUOROSCOPY TIME: FLUOROSCOPY TIME 12 seconds (1.1 mGy) COMPARISON:  Right femur radiographs-07/14/2022 FINDINGS: Twelve spot fluoroscopic images of the right hip and lower pelvis are provided for review and demonstrate the sequela ongoing right total hip replacement. Alignment appears anatomic. Redemonstrated sequela of previous lag screw fixation of the right superior pubic ramus as well as the right SI joint, incompletely  evaluated. Expected adjacent subcutaneous emphysema. No radiopaque foreign body. IMPRESSION: Post right total hip replacement  without evidence of complication. Electronically Signed   By: Sandi Mariscal M.D.   On: 07/16/2022 13:43   CT Head Wo Contrast  Result Date: 07/14/2022 CLINICAL DATA:  Fall injury with face plant onto hard concrete surface. EXAM: CT HEAD WITHOUT CONTRAST CT MAXILLOFACIAL WITHOUT CONTRAST TECHNIQUE: Multidetector CT imaging of the head and maxillofacial structures were performed using the standard protocol without intravenous contrast. Multiplanar CT image reconstructions of the maxillofacial structures were also generated. RADIATION DOSE REDUCTION: This exam was performed according to the departmental dose-optimization program which includes automated exposure control, adjustment of the mA and/or kV according to patient size and/or use of iterative reconstruction technique. COMPARISON:  Head CT 12/03/2021, facial CT 04/14/2016, brain MRI 12/04/2021 FINDINGS: CT HEAD FINDINGS Brain: There is mild cerebral atrophy and small vessel disease and moderate chronic ventriculomegaly out of proportion, unchanged. There is a midline posterior fossa arachnoid cyst versus mega cisterna magna. No midline shift, hemorrhage, mass effect or acute cortical based infarct are seen. No new abnormality. Vascular: There are patchy calcifications of the carotid siphons but no hyperdense central vessel. Skull: No appreciable scalp hematoma. The calvarium, skull base and orbits are intact. Other: Mild scattered right mastoid fluid is again shown. Left mastoids are clear. No middle ear effusions. CT MAXILLOFACIAL FINDINGS Osseous: No facial fracture or mandibular dislocation is seen. Degenerative arthrosis and mild remodeling both TMJs. The patient is edentulous except for small fragments of the roots of the bilateral maxillary canines and a root fragment of either the first or second right maxillary bicuspid, which again demonstrates a periapical lucency which has increased compared to the prior study with cortical dehiscence lateral to  the root. Impacted wisdom teeth are still contained within the mandible. There is facial osteopenia. Orbits: No acute traumatic or inflammatory findings. Interval lens replacements. Sinuses: There is mild membrane disease in the ethmoid air cells. Rest of the sinuses are clear. The ostiomeatal complexes are patent. Nasal septum mildly deviates right. Soft tissues: No appreciable hematoma. IMPRESSION: 1. No acute intracranial CT findings or interval changes. Stable disproportionate ventriculomegaly to peripheral atrophy and small-vessel disease. 2. 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 Also unchanged. 3. No facial fracture is seen. 4. Bilateral TMJ DJD. 5. The root structure for the first or second right maxillary bicuspid is still in place with a periapical lucency. Follow-up with a dentist is recommended. There are small root fragments of both maxillary canines still seen as well. Rest of the teeth have been removed since 2017. 6. Mild scattered right mastoid fluid, chronic. Electronically Signed   By: Telford Nab M.D.   On: 07/14/2022 22:58   CT Maxillofacial Wo Contrast  Result Date: 07/14/2022 CLINICAL DATA:  Fall injury with face plant onto hard concrete surface. EXAM: CT HEAD WITHOUT CONTRAST CT MAXILLOFACIAL WITHOUT CONTRAST TECHNIQUE: Multidetector CT imaging of the head and maxillofacial structures were performed using the standard protocol without intravenous contrast. Multiplanar CT image reconstructions of the maxillofacial structures were also generated. RADIATION DOSE REDUCTION: This exam was performed according to the departmental dose-optimization program which includes automated exposure control, adjustment of the mA and/or kV according to patient size and/or use of iterative reconstruction technique. COMPARISON:  Head CT 12/03/2021, facial CT 04/14/2016, brain MRI 12/04/2021 FINDINGS: CT HEAD FINDINGS Brain: There is mild cerebral atrophy and small vessel  disease and moderate chronic ventriculomegaly out of proportion, unchanged. There is  a midline posterior fossa arachnoid cyst versus mega cisterna magna. No midline shift, hemorrhage, mass effect or acute cortical based infarct are seen. No new abnormality. Vascular: There are patchy calcifications of the carotid siphons but no hyperdense central vessel. Skull: No appreciable scalp hematoma. The calvarium, skull base and orbits are intact. Other: Mild scattered right mastoid fluid is again shown. Left mastoids are clear. No middle ear effusions. CT MAXILLOFACIAL FINDINGS Osseous: No facial fracture or mandibular dislocation is seen. Degenerative arthrosis and mild remodeling both TMJs. The patient is edentulous except for small fragments of the roots of the bilateral maxillary canines and a root fragment of either the first or second right maxillary bicuspid, which again demonstrates a periapical lucency which has increased compared to the prior study with cortical dehiscence lateral to the root. Impacted wisdom teeth are still contained within the mandible. There is facial osteopenia. Orbits: No acute traumatic or inflammatory findings. Interval lens replacements. Sinuses: There is mild membrane disease in the ethmoid air cells. Rest of the sinuses are clear. The ostiomeatal complexes are patent. Nasal septum mildly deviates right. Soft tissues: No appreciable hematoma. IMPRESSION: 1. No acute intracranial CT findings or interval changes. Stable disproportionate ventriculomegaly to peripheral atrophy and small-vessel disease. 2. 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 Also unchanged. 3. No facial fracture is seen. 4. Bilateral TMJ DJD. 5. The root structure for the first or second right maxillary bicuspid is still in place with a periapical lucency. Follow-up with a dentist is recommended. There are small root fragments of both maxillary canines still seen as well. Rest of the teeth have  been removed since 2017. 6. Mild scattered right mastoid fluid, chronic. Electronically Signed   By: Telford Nab M.D.   On: 07/14/2022 22:58   DG Pelvis 1-2 Views  Result Date: 07/14/2022 CLINICAL DATA:  Pain after a fall.  Severe right hip pain. EXAM: RIGHT FEMUR 2 VIEWS; PELVIS - 1-2 VIEW COMPARISON:  09/06/2020 FINDINGS: Pelvis demonstrates previous screw fixations across the right SI joint and right superior pubic ramus. Old ununited fractures of the inferior pubic rami bilaterally without change. No acute pelvic fractures are seen. There is an acute transverse fracture of the right femoral neck with varus angulation of the fracture fragments. Mid and distal right femur appears intact. Mild degenerative changes in the medial compartment of the right knee. Vascular calcifications. IMPRESSION: 1. Old fracture deformities and postoperative changes in the pelvis. 2. Acute fracture of the right femoral neck with varus angulation of the fracture fragments. Electronically Signed   By: Lucienne Capers M.D.   On: 07/14/2022 22:35   DG FEMUR, MIN 2 VIEWS RIGHT  Result Date: 07/14/2022 CLINICAL DATA:  Pain after a fall.  Severe right hip pain. EXAM: RIGHT FEMUR 2 VIEWS; PELVIS - 1-2 VIEW COMPARISON:  09/06/2020 FINDINGS: Pelvis demonstrates previous screw fixations across the right SI joint and right superior pubic ramus. Old ununited fractures of the inferior pubic rami bilaterally without change. No acute pelvic fractures are seen. There is an acute transverse fracture of the right femoral neck with varus angulation of the fracture fragments. Mid and distal right femur appears intact. Mild degenerative changes in the medial compartment of the right knee. Vascular calcifications. IMPRESSION: 1. Old fracture deformities and postoperative changes in the pelvis. 2. Acute fracture of the right femoral neck with varus angulation of the fracture fragments. Electronically Signed   By: Lucienne Capers M.D.   On:  07/14/2022 22:35  DG Chest 1 View  Result Date: 07/14/2022 CLINICAL DATA:  Fall.  Preop chest radiograph. EXAM: CHEST  1 VIEW COMPARISON:  Chest radiograph dated 07/26/2021. FINDINGS: The lungs are clear. There is no pleural effusion pneumothorax. Top-normal cardiac size. No acute osseous pathology. IMPRESSION: No active disease. Electronically Signed   By: Anner Crete M.D.   On: 07/14/2022 22:34     Time coordinating discharge: Over 30 minutes    Dwyane Dee, MD  [facility] 07/22/2022, 11:04 AM

## 2022-07-22 NOTE — Plan of Care (Signed)
Pt ready to DC to SNF with his daughter to transport.

## 2022-07-26 ENCOUNTER — Telehealth: Payer: Self-pay

## 2022-07-26 DIAGNOSIS — R296 Repeated falls: Secondary | ICD-10-CM | POA: Diagnosis not present

## 2022-07-26 DIAGNOSIS — S72001D Fracture of unspecified part of neck of right femur, subsequent encounter for closed fracture with routine healing: Secondary | ICD-10-CM | POA: Diagnosis not present

## 2022-07-26 DIAGNOSIS — I480 Paroxysmal atrial fibrillation: Secondary | ICD-10-CM | POA: Diagnosis not present

## 2022-07-26 DIAGNOSIS — R4182 Altered mental status, unspecified: Secondary | ICD-10-CM | POA: Diagnosis not present

## 2022-07-26 NOTE — Telephone Encounter (Signed)
Pt has already scheduled ED f/u with PCP on 11/15 for fall encounter on 10/22.

## 2022-07-29 DIAGNOSIS — R4182 Altered mental status, unspecified: Secondary | ICD-10-CM | POA: Diagnosis not present

## 2022-07-29 DIAGNOSIS — I480 Paroxysmal atrial fibrillation: Secondary | ICD-10-CM | POA: Diagnosis not present

## 2022-07-29 DIAGNOSIS — S72001D Fracture of unspecified part of neck of right femur, subsequent encounter for closed fracture with routine healing: Secondary | ICD-10-CM | POA: Diagnosis not present

## 2022-07-29 DIAGNOSIS — R296 Repeated falls: Secondary | ICD-10-CM | POA: Diagnosis not present

## 2022-07-30 DIAGNOSIS — I480 Paroxysmal atrial fibrillation: Secondary | ICD-10-CM | POA: Diagnosis not present

## 2022-07-30 DIAGNOSIS — R4182 Altered mental status, unspecified: Secondary | ICD-10-CM | POA: Diagnosis not present

## 2022-07-30 DIAGNOSIS — R296 Repeated falls: Secondary | ICD-10-CM | POA: Diagnosis not present

## 2022-07-30 DIAGNOSIS — S72001D Fracture of unspecified part of neck of right femur, subsequent encounter for closed fracture with routine healing: Secondary | ICD-10-CM | POA: Diagnosis not present

## 2022-08-01 ENCOUNTER — Ambulatory Visit: Payer: Medicare Other | Admitting: Podiatry

## 2022-08-01 ENCOUNTER — Telehealth: Payer: Self-pay | Admitting: Internal Medicine

## 2022-08-01 DIAGNOSIS — S72001D Fracture of unspecified part of neck of right femur, subsequent encounter for closed fracture with routine healing: Secondary | ICD-10-CM | POA: Diagnosis not present

## 2022-08-01 DIAGNOSIS — I480 Paroxysmal atrial fibrillation: Secondary | ICD-10-CM | POA: Diagnosis not present

## 2022-08-01 DIAGNOSIS — Z7951 Long term (current) use of inhaled steroids: Secondary | ICD-10-CM | POA: Diagnosis not present

## 2022-08-01 DIAGNOSIS — N400 Enlarged prostate without lower urinary tract symptoms: Secondary | ICD-10-CM | POA: Diagnosis not present

## 2022-08-01 DIAGNOSIS — F17210 Nicotine dependence, cigarettes, uncomplicated: Secondary | ICD-10-CM | POA: Diagnosis not present

## 2022-08-01 DIAGNOSIS — E7800 Pure hypercholesterolemia, unspecified: Secondary | ICD-10-CM | POA: Diagnosis not present

## 2022-08-01 DIAGNOSIS — Z96641 Presence of right artificial hip joint: Secondary | ICD-10-CM | POA: Diagnosis not present

## 2022-08-01 DIAGNOSIS — J449 Chronic obstructive pulmonary disease, unspecified: Secondary | ICD-10-CM | POA: Diagnosis not present

## 2022-08-01 DIAGNOSIS — F32A Depression, unspecified: Secondary | ICD-10-CM | POA: Diagnosis not present

## 2022-08-01 DIAGNOSIS — K5904 Chronic idiopathic constipation: Secondary | ICD-10-CM | POA: Diagnosis not present

## 2022-08-01 DIAGNOSIS — Z9181 History of falling: Secondary | ICD-10-CM | POA: Diagnosis not present

## 2022-08-01 DIAGNOSIS — K219 Gastro-esophageal reflux disease without esophagitis: Secondary | ICD-10-CM | POA: Diagnosis not present

## 2022-08-01 NOTE — Telephone Encounter (Signed)
Verbal orders given to [facility] for PT   2x for 3w 1x for 5w

## 2022-08-01 NOTE — Telephone Encounter (Signed)
Kelly from [facility] called for verbal orders for home PT for: 2X for 3 weeks 1X for 5 weeks   Please call [facility] to confirm: [phone]

## 2022-08-07 ENCOUNTER — Other Ambulatory Visit (HOSPITAL_COMMUNITY): Payer: Self-pay

## 2022-08-07 ENCOUNTER — Telehealth: Payer: Self-pay | Admitting: Internal Medicine

## 2022-08-07 ENCOUNTER — Ambulatory Visit: Payer: Medicare Other | Admitting: Internal Medicine

## 2022-08-07 ENCOUNTER — Other Ambulatory Visit: Payer: Self-pay | Admitting: Internal Medicine

## 2022-08-07 MED ORDER — PANTOPRAZOLE SODIUM 40 MG PO TBEC
40.0000 mg | DELAYED_RELEASE_TABLET | Freq: Every day | ORAL | 0 refills | Status: DC
  Filled 2022-08-07: qty 90, 90d supply, fill #0

## 2022-08-07 NOTE — Telephone Encounter (Signed)
Shireen Miller called from [facility] about needing verbal orders on the patients occupational therapy, says the frequency of 1 week 7. She can be reached at [phone].

## 2022-08-08 ENCOUNTER — Other Ambulatory Visit (HOSPITAL_COMMUNITY): Payer: Self-pay

## 2022-08-08 NOTE — Telephone Encounter (Signed)
LVM for Shireen for VO for OT for frequency 1x week for 7w

## 2022-08-14 ENCOUNTER — Other Ambulatory Visit (HOSPITAL_COMMUNITY): Payer: Self-pay

## 2022-08-22 ENCOUNTER — Telehealth: Payer: Self-pay | Admitting: Internal Medicine

## 2022-08-22 NOTE — Telephone Encounter (Signed)
Missed occupational therapy visit today.

## 2022-08-28 DIAGNOSIS — Z5189 Encounter for other specified aftercare: Secondary | ICD-10-CM | POA: Diagnosis not present

## 2022-08-29 ENCOUNTER — Telehealth: Payer: Self-pay | Admitting: Internal Medicine

## 2022-08-29 NOTE — Telephone Encounter (Signed)
Shareen from [facility] states the pt has met physical therapy goals and is going to be discharged early.  For any questions please call Shareen:  [phone] 

## 2022-08-29 NOTE — Telephone Encounter (Signed)
Noted  

## 2022-09-04 ENCOUNTER — Telehealth: Payer: Self-pay | Admitting: Internal Medicine

## 2022-09-04 NOTE — Telephone Encounter (Signed)
LVM informing patient that I had to reschedule his AWV fro 09/11/22 at 8:30 to 09/20/22 at 8:84 am. Because [facility] is no longer with our office,

## 2022-09-09 ENCOUNTER — Other Ambulatory Visit: Payer: Self-pay | Admitting: Internal Medicine

## 2022-09-09 ENCOUNTER — Other Ambulatory Visit (HOSPITAL_COMMUNITY): Payer: Self-pay

## 2022-09-09 DIAGNOSIS — M5416 Radiculopathy, lumbar region: Secondary | ICD-10-CM

## 2022-09-09 MED ORDER — GABAPENTIN 600 MG PO TABS
600.0000 mg | ORAL_TABLET | Freq: Three times a day (TID) | ORAL | 0 refills | Status: DC
  Filled 2022-09-09: qty 270, 90d supply, fill #0

## 2022-09-11 ENCOUNTER — Ambulatory Visit: Payer: Medicare Other

## 2022-09-18 ENCOUNTER — Other Ambulatory Visit (HOSPITAL_COMMUNITY): Payer: Self-pay

## 2022-09-18 ENCOUNTER — Encounter: Payer: Self-pay | Admitting: Internal Medicine

## 2022-09-18 ENCOUNTER — Ambulatory Visit (INDEPENDENT_AMBULATORY_CARE_PROVIDER_SITE_OTHER): Payer: Medicare Other | Admitting: Internal Medicine

## 2022-09-18 VITALS — BP 118/66 | HR 79 | Temp 98.5°F | Ht 67.0 in | Wt 151.0 lb

## 2022-09-18 DIAGNOSIS — D510 Vitamin B12 deficiency anemia due to intrinsic factor deficiency: Secondary | ICD-10-CM

## 2022-09-18 DIAGNOSIS — N401 Enlarged prostate with lower urinary tract symptoms: Secondary | ICD-10-CM | POA: Diagnosis not present

## 2022-09-18 DIAGNOSIS — M8000XD Age-related osteoporosis with current pathological fracture, unspecified site, subsequent encounter for fracture with routine healing: Secondary | ICD-10-CM

## 2022-09-18 DIAGNOSIS — E785 Hyperlipidemia, unspecified: Secondary | ICD-10-CM | POA: Diagnosis not present

## 2022-09-18 DIAGNOSIS — D539 Nutritional anemia, unspecified: Secondary | ICD-10-CM | POA: Diagnosis not present

## 2022-09-18 DIAGNOSIS — N138 Other obstructive and reflux uropathy: Secondary | ICD-10-CM | POA: Diagnosis not present

## 2022-09-18 DIAGNOSIS — Z23 Encounter for immunization: Secondary | ICD-10-CM

## 2022-09-18 DIAGNOSIS — Z0001 Encounter for general adult medical examination with abnormal findings: Secondary | ICD-10-CM

## 2022-09-18 DIAGNOSIS — Z0000 Encounter for general adult medical examination without abnormal findings: Secondary | ICD-10-CM

## 2022-09-18 DIAGNOSIS — E559 Vitamin D deficiency, unspecified: Secondary | ICD-10-CM

## 2022-09-18 DIAGNOSIS — R7303 Prediabetes: Secondary | ICD-10-CM

## 2022-09-18 DIAGNOSIS — M8000XA Age-related osteoporosis with current pathological fracture, unspecified site, initial encounter for fracture: Secondary | ICD-10-CM | POA: Insufficient documentation

## 2022-09-18 DIAGNOSIS — E519 Thiamine deficiency, unspecified: Secondary | ICD-10-CM

## 2022-09-18 DIAGNOSIS — Z720 Tobacco use: Secondary | ICD-10-CM

## 2022-09-18 LAB — CBC WITH DIFFERENTIAL/PLATELET
Basophils Absolute: 0.1 10*3/uL (ref 0.0–0.1)
Basophils Relative: 0.9 % (ref 0.0–3.0)
Eosinophils Absolute: 0.1 10*3/uL (ref 0.0–0.7)
Eosinophils Relative: 1 % (ref 0.0–5.0)
HCT: 43.1 % (ref 39.0–52.0)
Hemoglobin: 14.4 g/dL (ref 13.0–17.0)
Lymphocytes Relative: 25.6 % (ref 12.0–46.0)
Lymphs Abs: 2.4 10*3/uL (ref 0.7–4.0)
MCHC: 33.3 g/dL (ref 30.0–36.0)
MCV: 97 fl (ref 78.0–100.0)
Monocytes Absolute: 0.6 10*3/uL (ref 0.1–1.0)
Monocytes Relative: 6.5 % (ref 3.0–12.0)
Neutro Abs: 6.2 10*3/uL (ref 1.4–7.7)
Neutrophils Relative %: 66 % (ref 43.0–77.0)
Platelets: 241 10*3/uL (ref 150.0–400.0)
RBC: 4.44 Mil/uL (ref 4.22–5.81)
RDW: 13.8 % (ref 11.5–15.5)
WBC: 9.4 10*3/uL (ref 4.0–10.5)

## 2022-09-18 LAB — HEMOGLOBIN A1C: Hgb A1c MFr Bld: 5.8 % (ref 4.6–6.5)

## 2022-09-18 LAB — BASIC METABOLIC PANEL
BUN: 21 mg/dL (ref 6–23)
CO2: 28 mEq/L (ref 19–32)
Calcium: 9.4 mg/dL (ref 8.4–10.5)
Chloride: 105 mEq/L (ref 96–112)
Creatinine, Ser: 1.02 mg/dL (ref 0.40–1.50)
GFR: 76.67 mL/min (ref >60.00)
Glucose, Bld: 113 mg/dL — ABNORMAL HIGH (ref 70–99)
Potassium: 4.3 mEq/L (ref 3.5–5.1)
Sodium: 142 mEq/L (ref 135–145)

## 2022-09-18 LAB — TSH: TSH: 0.96 u[IU]/mL (ref 0.35–5.50)

## 2022-09-18 LAB — PSA: PSA: 3.2 ng/mL (ref 0.10–4.00)

## 2022-09-18 LAB — IBC + FERRITIN
Ferritin: 135.4 ng/mL (ref 22.0–322.0)
Iron: 113 ug/dL (ref 42–165)
Saturation Ratios: 38.8 % (ref 20.0–50.0)
TIBC: 291.2 ug/dL (ref 250.0–450.0)
Transferrin: 208 mg/dL — ABNORMAL LOW (ref 212.0–360.0)

## 2022-09-18 LAB — VITAMIN B12: Vitamin B-12: 158 pg/mL — ABNORMAL LOW (ref 211–911)

## 2022-09-18 LAB — FOLATE: Folate: 10.5 ng/mL (ref >5.9)

## 2022-09-18 MED ORDER — CHOLECALCIFEROL 50 MCG (2000 UT) PO TABS: 1.0000 | ORAL_TABLET | Freq: Every day | ORAL | 1 refills | Status: AC

## 2022-09-18 NOTE — Progress Notes (Unsigned)
Subjective:  Patient ID: Taylor Ochoa, male    DOB: 27-Jul-1956  Age: 66 y.o. MRN: [id number]  CC: Annual Exam and Anemia   HPI [name] presents for a CPX and f/up -    His wife complains that he is showing signs of cognitive decline. He has recovered from a recent hip fracture. He was anemic after surgery.   Outpatient Medications Prior to Visit  Medication Sig Dispense Refill   acetaminophen (TYLENOL) 325 MG tablet Take 2 tablets (650 mg total) by mouth every 4 (four) hours as needed for mild pain or moderate pain (or Fever >/= 101).     Aspirin 81 MG CAPS Take 1 capsule every day by oral route.     gabapentin (NEURONTIN) 600 MG tablet Take 1 tablet (600 mg total) by mouth 3 (three) times daily. 270 tablet 0   pantoprazole (PROTONIX) 40 MG tablet Take 1 tablet (40 mg total) by mouth daily. 90 tablet 0   sertraline (ZOLOFT) 100 MG tablet Take 1 tablet (100 mg total) by mouth daily. 90 tablet 1   Tiotropium Bromide Monohydrate (SPIRIVA RESPIMAT) 1.25 MCG/ACT AERS Inhale 2 puffs into the lungs daily. 4 g 5   ferrous sulfate 325 (65 FE) MG tablet Take 1 tablet (325 mg total) by mouth daily with breakfast.  3   rosuvastatin (CRESTOR) 20 MG tablet Take 1 tablet (20 mg total) by mouth daily. 90 tablet 1   senna-docusate (SENOKOT-S) 8.6-50 MG tablet Take 1 tablet by mouth 2 (two) times daily as needed for mild constipation.     oxyCODONE (ROXICODONE) 5 MG immediate release tablet Take 1 tablet (5 mg total) by mouth every 4 (four) hours as needed. (Patient not taking: Reported on 09/18/2022) 20 tablet 0   polyethylene glycol (MIRALAX / GLYCOLAX) 17 g packet Take 17 g by mouth daily. (Patient not taking: Reported on 09/18/2022) 14 each 0   No facility-administered medications prior to visit.    ROS Review of Systems  Constitutional: Negative.  Negative for appetite change, diaphoresis, fatigue and unexpected weight change.  HENT: Negative.    Eyes: Negative.   Respiratory:   Positive for cough and shortness of breath. Negative for chest tightness, wheezing and stridor.   Cardiovascular:  Negative for chest pain, palpitations and leg swelling.  Gastrointestinal:  Negative for abdominal pain, anal bleeding, blood in stool, constipation, diarrhea, nausea and vomiting.  Endocrine: Negative.   Genitourinary: Negative.  Negative for difficulty urinating.  Musculoskeletal:  Positive for arthralgias. Negative for myalgias.  Skin: Negative.  Negative for color change and pallor.  Neurological: Negative.  Negative for dizziness, weakness and light-headedness.  Hematological:  Negative for adenopathy. Does not bruise/bleed easily.  Psychiatric/Behavioral:  Positive for confusion and decreased concentration. Negative for agitation, behavioral problems, self-injury and suicidal ideas. The patient is not nervous/anxious.     Objective:  BP 118/66 (BP Location: Right Arm, Patient Position: Sitting, Cuff Size: Large)   Pulse 79   Temp 98.5 F (36.9 C) (Oral)   Ht '5\' 7"'$  (1.702 m)   Wt 151 lb (68.5 kg)   SpO2 95%   BMI 23.65 kg/m   BP Readings from Last 3 Encounters:  09/18/22 118/66  07/22/22 123/75  01/23/22 128/76    Wt Readings from Last 3 Encounters:  09/18/22 151 lb (68.5 kg)  07/20/22 144 lb 6.4 oz (65.5 kg)  01/23/22 151 lb (68.5 kg)    Physical Exam Vitals reviewed.  Constitutional:  Appearance: He is ill-appearing.  HENT:     Mouth/Throat:     Mouth: Mucous membranes are moist.  Eyes:     General: No scleral icterus.    Conjunctiva/sclera: Conjunctivae normal.  Cardiovascular:     Rate and Rhythm: Normal rate and regular rhythm.     Heart sounds: No murmur heard. Pulmonary:     Effort: Pulmonary effort is normal. No tachypnea or respiratory distress.     Breath sounds: Examination of the right-upper field reveals decreased breath sounds. Examination of the left-upper field reveals decreased breath sounds. Examination of the right-middle  field reveals decreased breath sounds. Examination of the left-middle field reveals decreased breath sounds. Examination of the right-lower field reveals decreased breath sounds. Examination of the left-lower field reveals decreased breath sounds and rhonchi. Decreased breath sounds and rhonchi present. No wheezing or rales.  Abdominal:     General: Abdomen is flat.     Palpations: There is no mass.     Tenderness: There is no abdominal tenderness. There is no guarding or rebound.     Hernia: No hernia is present. There is no hernia in the left inguinal area or right inguinal area.  Genitourinary:    Pubic Area: No rash.      Penis: Normal and circumcised.      Testes: Normal.     Epididymis:     Right: Normal.     Left: Normal.     Prostate: Enlarged. Not tender and no nodules present.     Rectum: Normal. Guaiac result negative. No mass, tenderness, anal fissure, external hemorrhoid or internal hemorrhoid. Normal anal tone.  Musculoskeletal:        General: Normal range of motion.     Cervical back: Neck supple.     Right lower leg: No edema.     Left lower leg: No edema.  Lymphadenopathy:     Cervical: No cervical adenopathy.     Lower Body: No right inguinal adenopathy. No left inguinal adenopathy.  Skin:    General: Skin is warm and dry.  Neurological:     General: No focal deficit present.     Mental Status: He is alert. Mental status is at baseline.  Psychiatric:        Mood and Affect: Mood normal.        Behavior: Behavior normal.        Thought Content: Thought content normal.     Lab Results  Component Value Date   WBC 9.4 09/18/2022   HGB 14.4 09/18/2022   HCT 43.1 09/18/2022   PLT 241.0 09/18/2022   GLUCOSE 113 (H) 09/18/2022   CHOL 174 07/25/2021   TRIG 160.0 (H) 07/25/2021   HDL 51.40 07/25/2021   LDLCALC 91 07/25/2021   ALT 14 07/15/2022   AST 22 07/15/2022   NA 142 09/18/2022   K 4.3 09/18/2022   CL 105 09/18/2022   CREATININE 1.02 09/18/2022   BUN  21 09/18/2022   CO2 28 09/18/2022   TSH 0.96 09/18/2022   PSA 3.20 09/18/2022   INR 1.0 07/14/2022   HGBA1C 5.8 09/18/2022    CT Head Wo Contrast  Result Date: 07/14/2022 CLINICAL DATA:  Fall injury with face plant onto hard concrete surface. EXAM: CT HEAD WITHOUT CONTRAST CT MAXILLOFACIAL WITHOUT CONTRAST TECHNIQUE: Multidetector CT imaging of the head and maxillofacial structures were performed using the standard protocol without intravenous contrast. Multiplanar CT image reconstructions of the maxillofacial structures were also generated. RADIATION DOSE REDUCTION: This  exam was performed according to the departmental dose-optimization program which includes automated exposure control, adjustment of the mA and/or kV according to patient size and/or use of iterative reconstruction technique. COMPARISON:  Head CT 12/03/2021, facial CT 04/14/2016, brain MRI 12/04/2021 FINDINGS: CT HEAD FINDINGS Brain: There is mild cerebral atrophy and small vessel disease and moderate chronic ventriculomegaly out of proportion, unchanged. There is a midline posterior fossa arachnoid cyst versus mega cisterna magna. No midline shift, hemorrhage, mass effect or acute cortical based infarct are seen. No new abnormality. Vascular: There are patchy calcifications of the carotid siphons but no hyperdense central vessel. Skull: No appreciable scalp hematoma. The calvarium, skull base and orbits are intact. Other: Mild scattered right mastoid fluid is again shown. Left mastoids are clear. No middle ear effusions. CT MAXILLOFACIAL FINDINGS Osseous: No facial fracture or mandibular dislocation is seen. Degenerative arthrosis and mild remodeling both TMJs. The patient is edentulous except for small fragments of the roots of the bilateral maxillary canines and a root fragment of either the first or second right maxillary bicuspid, which again demonstrates a periapical lucency which has increased compared to the prior study with  cortical dehiscence lateral to the root. Impacted wisdom teeth are still contained within the mandible. There is facial osteopenia. Orbits: No acute traumatic or inflammatory findings. Interval lens replacements. Sinuses: There is mild membrane disease in the ethmoid air cells. Rest of the sinuses are clear. The ostiomeatal complexes are patent. Nasal septum mildly deviates right. Soft tissues: No appreciable hematoma. IMPRESSION: 1. No acute intracranial CT findings or interval changes. Stable disproportionate ventriculomegaly to peripheral atrophy and small-vessel disease. 2. 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 Also unchanged. 3. No facial fracture is seen. 4. Bilateral TMJ DJD. 5. The root structure for the first or second right maxillary bicuspid is still in place with a periapical lucency. Follow-up with a dentist is recommended. There are small root fragments of both maxillary canines still seen as well. Rest of the teeth have been removed since 2017. 6. Mild scattered right mastoid fluid, chronic. Electronically Signed   By: Telford Nab M.D.   On: 07/14/2022 22:58   CT Maxillofacial Wo Contrast  Result Date: 07/14/2022 CLINICAL DATA:  Fall injury with face plant onto hard concrete surface. EXAM: CT HEAD WITHOUT CONTRAST CT MAXILLOFACIAL WITHOUT CONTRAST TECHNIQUE: Multidetector CT imaging of the head and maxillofacial structures were performed using the standard protocol without intravenous contrast. Multiplanar CT image reconstructions of the maxillofacial structures were also generated. RADIATION DOSE REDUCTION: This exam was performed according to the departmental dose-optimization program which includes automated exposure control, adjustment of the mA and/or kV according to patient size and/or use of iterative reconstruction technique. COMPARISON:  Head CT 12/03/2021, facial CT 04/14/2016, brain MRI 12/04/2021 FINDINGS: CT HEAD FINDINGS Brain: There is mild cerebral  atrophy and small vessel disease and moderate chronic ventriculomegaly out of proportion, unchanged. There is a midline posterior fossa arachnoid cyst versus mega cisterna magna. No midline shift, hemorrhage, mass effect or acute cortical based infarct are seen. No new abnormality. Vascular: There are patchy calcifications of the carotid siphons but no hyperdense central vessel. Skull: No appreciable scalp hematoma. The calvarium, skull base and orbits are intact. Other: Mild scattered right mastoid fluid is again shown. Left mastoids are clear. No middle ear effusions. CT MAXILLOFACIAL FINDINGS Osseous: No facial fracture or mandibular dislocation is seen. Degenerative arthrosis and mild remodeling both TMJs. The patient is edentulous except for small fragments of the  roots of the bilateral maxillary canines and a root fragment of either the first or second right maxillary bicuspid, which again demonstrates a periapical lucency which has increased compared to the prior study with cortical dehiscence lateral to the root. Impacted wisdom teeth are still contained within the mandible. There is facial osteopenia. Orbits: No acute traumatic or inflammatory findings. Interval lens replacements. Sinuses: There is mild membrane disease in the ethmoid air cells. Rest of the sinuses are clear. The ostiomeatal complexes are patent. Nasal septum mildly deviates right. Soft tissues: No appreciable hematoma. IMPRESSION: 1. No acute intracranial CT findings or interval changes. Stable disproportionate ventriculomegaly to peripheral atrophy and small-vessel disease. 2. 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 Also unchanged. 3. No facial fracture is seen. 4. Bilateral TMJ DJD. 5. The root structure for the first or second right maxillary bicuspid is still in place with a periapical lucency. Follow-up with a dentist is recommended. There are small root fragments of both maxillary canines still seen as  well. Rest of the teeth have been removed since 2017. 6. Mild scattered right mastoid fluid, chronic. Electronically Signed   By: Telford Nab M.D.   On: 07/14/2022 22:58   DG Pelvis 1-2 Views  Result Date: 07/14/2022 CLINICAL DATA:  Pain after a fall.  Severe right hip pain. EXAM: RIGHT FEMUR 2 VIEWS; PELVIS - 1-2 VIEW COMPARISON:  09/06/2020 FINDINGS: Pelvis demonstrates previous screw fixations across the right SI joint and right superior pubic ramus. Old ununited fractures of the inferior pubic rami bilaterally without change. No acute pelvic fractures are seen. There is an acute transverse fracture of the right femoral neck with varus angulation of the fracture fragments. Mid and distal right femur appears intact. Mild degenerative changes in the medial compartment of the right knee. Vascular calcifications. IMPRESSION: 1. Old fracture deformities and postoperative changes in the pelvis. 2. Acute fracture of the right femoral neck with varus angulation of the fracture fragments. Electronically Signed   By: Lucienne Capers M.D.   On: 07/14/2022 22:35   DG FEMUR, MIN 2 VIEWS RIGHT  Result Date: 07/14/2022 CLINICAL DATA:  Pain after a fall.  Severe right hip pain. EXAM: RIGHT FEMUR 2 VIEWS; PELVIS - 1-2 VIEW COMPARISON:  09/06/2020 FINDINGS: Pelvis demonstrates previous screw fixations across the right SI joint and right superior pubic ramus. Old ununited fractures of the inferior pubic rami bilaterally without change. No acute pelvic fractures are seen. There is an acute transverse fracture of the right femoral neck with varus angulation of the fracture fragments. Mid and distal right femur appears intact. Mild degenerative changes in the medial compartment of the right knee. Vascular calcifications. IMPRESSION: 1. Old fracture deformities and postoperative changes in the pelvis. 2. Acute fracture of the right femoral neck with varus angulation of the fracture fragments. Electronically Signed   By:  Lucienne Capers M.D.   On: 07/14/2022 22:35   DG Chest 1 View  Result Date: 07/14/2022 CLINICAL DATA:  Fall.  Preop chest radiograph. EXAM: CHEST  1 VIEW COMPARISON:  Chest radiograph dated 07/26/2021. FINDINGS: The lungs are clear. There is no pleural effusion pneumothorax. Top-normal cardiac size. No acute osseous pathology. IMPRESSION: No active disease. Electronically Signed   By: Anner Crete M.D.   On: 07/14/2022 22:34    Assessment & Plan:   [facility] was seen today for annual exam and anemia.  Diagnoses and all orders for this visit:  Age-related osteoporosis with current pathological fracture with routine healing, subsequent  encounter -     DG Bone Density; Future -     Basic metabolic panel; Future -     Basic metabolic panel  Vitamin D deficiency disease -     Cholecalciferol 50 MCG (2000 UT) TABS; Take 1 tablet (2,000 Units total) by mouth daily.  Prediabetes- His A1C is 5.8%. -     Basic metabolic panel; Future -     Hemoglobin A1c; Future -     Hemoglobin A1c -     Basic metabolic panel  Hyperlipidemia with target LDL less than 130- LDL goal achieved. Doing well on the statin  -     TSH; Future -     TSH -     rosuvastatin (CRESTOR) 20 MG tablet; Take 1 tablet (20 mg total) by mouth daily.  BPH with obstruction/lower urinary tract symptoms- PSA is normal. -     PSA; Future -     PSA  Tobacco abuse -     Ambulatory Referral for Lung Cancer Scre  Deficiency anemia- H/H are normal now. -     IBC + Ferritin; Future -     Reticulocytes; Future -     Vitamin B1; Future -     Zinc; Future -     Folate; Future -     Vitamin B12; Future -     CBC with Differential/Platelet; Future -     CBC with Differential/Platelet -     Vitamin B12 -     Folate -     Zinc -     Vitamin B1 -     Reticulocytes -     IBC + Ferritin  Flu vaccine need -     Flu Vaccine QUAD High Dose(Fluad)  Vitamin B12 deficiency anemia due to intrinsic factor deficiency- I have asked  him to start parenteral B12 replcement tx.  Manifestations of thiamine deficiency -     thiamine (VITAMIN B1) 100 MG tablet; Take 1 tablet (100 mg total) by mouth daily.   I have discontinued Jen[employer] L[employer] Sc[employer]ens's ferrous sulfate, polyethylene glycol, senna-docusate, and oxyCODONE. I am also having him start on Cholecalciferol and thiamine. Additionally, I am having him maintain his Spiriva Respimat, sertraline, acetaminophen, pantoprazole, gabapentin, Aspirin, and rosuvastatin.  Meds ordered this encounter  Medications   Cholecalciferol 50 MCG (2000 UT) TABS    Sig: Take 1 tablet (2,000 Units total) by mouth daily.    Dispense:  90 tablet    Refill:  1   thiamine (VITAMIN B1) 100 MG tablet    Sig: Take 1 tablet (100 mg total) by mouth daily.    Dispense:  90 tablet    Refill:  0   rosuvastatin (CRESTOR) 20 MG tablet    Sig: Take 1 tablet (20 mg total) by mouth daily.    Dispense:  90 tablet    Refill:  1     Follow-up: Return in about 3 months (around 12/18/2022).  Scarlette Calico, MD

## 2022-09-18 NOTE — Patient Instructions (Signed)

## 2022-09-20 ENCOUNTER — Ambulatory Visit: Payer: Medicare Other

## 2022-09-22 DIAGNOSIS — E519 Thiamine deficiency, unspecified: Secondary | ICD-10-CM | POA: Insufficient documentation

## 2022-09-22 LAB — RETICULOCYTES
ABS Retic: 59920 cells/uL (ref 25000–90000)
Retic Ct Pct: 1.4 %

## 2022-09-22 LAB — VITAMIN B1: Vitamin B1 (Thiamine): 7 nmol/L — ABNORMAL LOW (ref 8–30)

## 2022-09-22 LAB — ZINC: Zinc: 79 ug/dL (ref 60–130)

## 2022-09-22 MED ORDER — THIAMINE HCL 100 MG PO TABS: 100.0000 mg | ORAL_TABLET | Freq: Every day | ORAL | 0 refills | Status: DC

## 2022-09-23 ENCOUNTER — Encounter: Payer: Self-pay | Admitting: Internal Medicine

## 2022-09-23 DIAGNOSIS — E785 Hyperlipidemia, unspecified: Secondary | ICD-10-CM

## 2022-09-23 DIAGNOSIS — Z0001 Encounter for general adult medical examination with abnormal findings: Secondary | ICD-10-CM | POA: Insufficient documentation

## 2022-09-23 MED ORDER — ROSUVASTATIN CALCIUM 20 MG PO TABS: 20.0000 mg | ORAL_TABLET | Freq: Every day | ORAL | 1 refills | Status: DC

## 2022-09-23 NOTE — Assessment & Plan Note (Signed)
Exam completed. Labs reviewed. Vaccines reviewed and updated. Cancer screenings addressed. Pt ed material was given.

## 2022-09-24 ENCOUNTER — Other Ambulatory Visit (HOSPITAL_COMMUNITY): Payer: Self-pay

## 2022-09-24 MED ORDER — ROSUVASTATIN CALCIUM 20 MG PO TABS
20.0000 mg | ORAL_TABLET | Freq: Every day | ORAL | 1 refills | Status: DC
  Filled 2022-09-24: qty 90, 90d supply, fill #0

## 2022-09-25 ENCOUNTER — Ambulatory Visit (INDEPENDENT_AMBULATORY_CARE_PROVIDER_SITE_OTHER): Payer: Medicare Other

## 2022-09-25 VITALS — Ht 67.0 in | Wt 151.0 lb

## 2022-09-25 DIAGNOSIS — Z1211 Encounter for screening for malignant neoplasm of colon: Secondary | ICD-10-CM

## 2022-09-25 DIAGNOSIS — Z0000 Encounter for general adult medical examination without abnormal findings: Secondary | ICD-10-CM

## 2022-09-25 NOTE — Patient Instructions (Signed)
Taylor Ochoa , Thank you for taking time to come for your Medicare Wellness Visit. I appreciate your ongoing commitment to your health goals. Please review the following plan we discussed and let me know if I can assist you in the future.   These are the goals we discussed:  Goals      Client understands the importance of follow-up with providers by attending scheduled visits.        This is a list of the screening recommended for you and due dates:  Health Maintenance  Topic Date Due   Cologuard (Stool DNA test)  Never done   Zoster (Shingles) Vaccine (1 of 2) Never done   Screening for Lung Cancer  05/10/2020   COVID-19 Vaccine (2 - 2023-24 season) 05/24/2022   Medicare Annual Wellness Visit  09/26/2023   Pneumonia Vaccine (3 - PPSV23 or PCV20) 04/07/2024   DTaP/Tdap/Td vaccine (2 - Td or Tdap) 04/07/2029   Flu Shot  Completed   Hepatitis C Screening: USPSTF Recommendation to screen - Ages 18-79 yo.  Completed   HPV Vaccine  Aged Out    Advanced directives: No  Conditions/risks identified: Yes  Next appointment: Follow up in one year for your annual wellness visit.   Preventive Care 29 Years and Older, Male  Preventive care refers to lifestyle choices and visits with your health care provider that can promote health and wellness. What does preventive care include? A yearly physical exam. This is also called an annual well check. Dental exams once or twice a year. Routine eye exams. Ask your health care provider how often you should have your eyes checked. Personal lifestyle choices, including: Daily care of your teeth and gums. Regular physical activity. Eating a healthy diet. Avoiding tobacco and drug use. Limiting alcohol use. Practicing safe sex. Taking low doses of aspirin every day. Taking vitamin and mineral supplements as recommended by your health care provider. What happens during an annual well check? The services and screenings done by your health care  provider during your annual well check will depend on your age, overall health, lifestyle risk factors, and family history of disease. Counseling  Your health care provider may ask you questions about your: Alcohol use. Tobacco use. Drug use. Emotional well-being. Home and relationship well-being. Sexual activity. Eating habits. History of falls. Memory and ability to understand (cognition). Work and work Statistician. Screening  You may have the following tests or measurements: Height, weight, and BMI. Blood pressure. Lipid and cholesterol levels. These may be checked every 5 years, or more frequently if you are over 57 years old. Skin check. Lung cancer screening. You may have this screening every year starting at age 66 if you have a 30-pack-year history of smoking and currently smoke or have quit within the past 15 years. Fecal occult blood test (FOBT) of the stool. You may have this test every year starting at age 71. Flexible sigmoidoscopy or colonoscopy. You may have a sigmoidoscopy every 5 years or a colonoscopy every 10 years starting at age 7. Prostate cancer screening. Recommendations will vary depending on your family history and other risks. Hepatitis C blood test. Hepatitis B blood test. Sexually transmitted disease (STD) testing. Diabetes screening. This is done by checking your blood sugar (glucose) after you have not eaten for a while (fasting). You may have this done every 1-3 years. Abdominal aortic aneurysm (AAA) screening. You may need this if you are a current or former smoker. Osteoporosis. You may be screened starting at  age 59 if you are at high risk. Talk with your health care provider about your test results, treatment options, and if necessary, the need for more tests. Vaccines  Your health care provider may recommend certain vaccines, such as: Influenza vaccine. This is recommended every year. Tetanus, diphtheria, and acellular pertussis (Tdap, Td)  vaccine. You may need a Td booster every 10 years. Zoster vaccine. You may need this after age 30. Pneumococcal 13-valent conjugate (PCV13) vaccine. One dose is recommended after age 13. Pneumococcal polysaccharide (PPSV23) vaccine. One dose is recommended after age 65. Talk to your health care provider about which screenings and vaccines you need and how often you need them. This information is not intended to replace advice given to you by your health care provider. Make sure you discuss any questions you have with your health care provider. Document Released: 10/06/2015 Document Revised: 05/29/2016 Document Reviewed: 07/11/2015 Elsevier Interactive Patient Education  2017 [employer] Prevention in the Home Falls can cause injuries. They can happen to people of all ages. There are many things you can do to make your home safe and to help prevent falls. What can I do on the outside of my home? Regularly fix the edges of walkways and driveways and fix any cracks. Remove anything that might make you trip as you walk through a door, such as a raised step or threshold. Trim any bushes or trees on the path to your home. Use bright outdoor lighting. Clear any walking paths of anything that might make someone trip, such as rocks or tools. Regularly check to see if handrails are loose or broken. Make sure that both sides of any steps have handrails. Any raised decks and porches should have guardrails on the edges. Have any leaves, snow, or ice cleared regularly. Use sand or salt on walking paths during winter. Clean up any spills in your garage right away. This includes oil or grease spills. What can I do in the bathroom? Use night lights. Install grab bars by the toilet and in the tub and shower. Do not use towel bars as grab bars. Use non-skid mats or decals in the tub or shower. If you need to sit down in the shower, use a plastic, non-slip stool. Keep the floor dry. Clean up any  water that spills on the floor as soon as it happens. Remove soap buildup in the tub or shower regularly. Attach bath mats securely with double-sided non-slip rug tape. Do not have throw rugs and other things on the floor that can make you trip. What can I do in the bedroom? Use night lights. Make sure that you have a light by your bed that is easy to reach. Do not use any sheets or blankets that are too big for your bed. They should not hang down onto the floor. Have a firm chair that has side arms. You can use this for support while you get dressed. Do not have throw rugs and other things on the floor that can make you trip. What can I do in the kitchen? Clean up any spills right away. Avoid walking on wet floors. Keep items that you use a lot in easy-to-reach places. If you need to reach something above you, use a strong step stool that has a grab bar. Keep electrical cords out of the way. Do not use floor polish or wax that makes floors slippery. If you must use wax, use non-skid floor wax. Do not have throw rugs and  other things on the floor that can make you trip. What can I do with my stairs? Do not leave any items on the stairs. Make sure that there are handrails on both sides of the stairs and use them. Fix handrails that are broken or loose. Make sure that handrails are as long as the stairways. Check any carpeting to make sure that it is firmly attached to the stairs. Fix any carpet that is loose or worn. Avoid having throw rugs at the top or bottom of the stairs. If you do have throw rugs, attach them to the floor with carpet tape. Make sure that you have a light switch at the top of the stairs and the bottom of the stairs. If you do not have them, ask someone to add them for you. What else can I do to help prevent falls? Wear shoes that: Do not have high heels. Have rubber bottoms. Are comfortable and fit you well. Are closed at the toe. Do not wear sandals. If you use a  stepladder: Make sure that it is fully opened. Do not climb a closed stepladder. Make sure that both sides of the stepladder are locked into place. Ask someone to hold it for you, if possible. Clearly mark and make sure that you can see: Any grab bars or handrails. First and last steps. Where the edge of each step is. Use tools that help you move around (mobility aids) if they are needed. These include: Canes. Walkers. Scooters. Crutches. Turn on the lights when you go into a dark area. Replace any light bulbs as soon as they burn out. Set up your furniture so you have a clear path. Avoid moving your furniture around. If any of your floors are uneven, fix them. If there are any pets around you, be aware of where they are. Review your medicines with your doctor. Some medicines can make you feel dizzy. This can increase your chance of falling. Ask your doctor what other things that you can do to help prevent falls. This information is not intended to replace advice given to you by your health care provider. Make sure you discuss any questions you have with your health care provider. Document Released: 07/06/2009 Document Revised: 02/15/2016 Document Reviewed: 10/14/2014 Elsevier Interactive Patient Education  2017 [employer].
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
## 2022-09-26 ENCOUNTER — Ambulatory Visit (INDEPENDENT_AMBULATORY_CARE_PROVIDER_SITE_OTHER): Payer: Medicare Other | Admitting: *Deleted

## 2022-09-26 ENCOUNTER — Ambulatory Visit
Admission: RE | Admit: 2022-09-26 | Discharge: 2022-09-26 | Disposition: A | Discharge status: Home / Self Care | Payer: Medicare Other | Source: Ambulatory Visit | Attending: Internal Medicine | Admitting: Internal Medicine

## 2022-09-26 DIAGNOSIS — M810 Age-related osteoporosis without current pathological fracture: Secondary | ICD-10-CM | POA: Diagnosis not present

## 2022-09-26 DIAGNOSIS — D510 Vitamin B12 deficiency anemia due to intrinsic factor deficiency: Secondary | ICD-10-CM

## 2022-09-26 DIAGNOSIS — M8588 Other specified disorders of bone density and structure, other site: Secondary | ICD-10-CM | POA: Diagnosis not present

## 2022-09-26 DIAGNOSIS — M8000XD Age-related osteoporosis with current pathological fracture, unspecified site, subsequent encounter for fracture with routine healing: Secondary | ICD-10-CM

## 2022-09-26 MED ORDER — CYANOCOBALAMIN 1000 MCG/ML IJ SOLN
1000.0000 ug | Freq: Once | INTRAMUSCULAR | Status: AC
  Administered 2022-09-26: 1000 ug via INTRAMUSCULAR

## 2022-09-26 NOTE — Progress Notes (Signed)
Pls cosign for B12 inj../lmb  

## 2022-10-03 ENCOUNTER — Ambulatory Visit (INDEPENDENT_AMBULATORY_CARE_PROVIDER_SITE_OTHER): Payer: Medicare Other | Admitting: *Deleted

## 2022-10-03 DIAGNOSIS — D510 Vitamin B12 deficiency anemia due to intrinsic factor deficiency: Secondary | ICD-10-CM

## 2022-10-03 MED ORDER — CYANOCOBALAMIN 1000 MCG/ML IJ SOLN
1000.0000 ug | Freq: Once | INTRAMUSCULAR | Status: AC
  Administered 2022-10-03: 1000 ug via INTRAMUSCULAR

## 2022-10-03 NOTE — Progress Notes (Signed)
Pls cosign for B12 inj../lmb  

## 2022-10-04 ENCOUNTER — Telehealth: Payer: Self-pay

## 2022-10-04 NOTE — Telephone Encounter (Signed)
Janith Lima, MD  Jasper Loser, CMA Please set him up for prolia

## 2022-10-04 NOTE — Telephone Encounter (Signed)
Prolia VOB initiated via [email or web address] ? ?New start ? ?

## 2022-10-10 ENCOUNTER — Ambulatory Visit (INDEPENDENT_AMBULATORY_CARE_PROVIDER_SITE_OTHER): Payer: Medicare Other | Admitting: *Deleted

## 2022-10-10 DIAGNOSIS — D510 Vitamin B12 deficiency anemia due to intrinsic factor deficiency: Secondary | ICD-10-CM

## 2022-10-10 MED ORDER — CYANOCOBALAMIN 1000 MCG/ML IJ SOLN
1000.0000 ug | Freq: Once | INTRAMUSCULAR | Status: AC
  Administered 2022-10-10: 1000 ug via INTRAMUSCULAR

## 2022-10-10 NOTE — Progress Notes (Signed)
Pls cosign for B12 inj../lmb  

## 2022-10-16 NOTE — Telephone Encounter (Signed)
Prior auth required for PROLIA  PA PROCESS DETAILS: PA is required for (Buy and Bill/ Specialty Pharmacy) & is not on file. Providers may call Medical Utilization at [phone] to initiate. Forms may be accessed online at [email or web address] & faxed back to [phone] .  

## 2022-10-23 ENCOUNTER — Other Ambulatory Visit (HOSPITAL_COMMUNITY): Payer: Self-pay

## 2022-10-23 DIAGNOSIS — R351 Nocturia: Secondary | ICD-10-CM | POA: Diagnosis not present

## 2022-10-23 DIAGNOSIS — R3911 Hesitancy of micturition: Secondary | ICD-10-CM | POA: Diagnosis not present

## 2022-10-23 DIAGNOSIS — R3912 Poor urinary stream: Secondary | ICD-10-CM | POA: Diagnosis not present

## 2022-10-23 DIAGNOSIS — N401 Enlarged prostate with lower urinary tract symptoms: Secondary | ICD-10-CM | POA: Diagnosis not present

## 2022-10-23 MED ORDER — TAMSULOSIN HCL 0.4 MG PO CAPS
0.4000 mg | ORAL_CAPSULE | Freq: Every day | ORAL | 11 refills | Status: DC
  Filled 2022-10-23: qty 30, 30d supply, fill #1
  Filled 2022-10-23: qty 30, 30d supply, fill #0

## 2022-10-29 ENCOUNTER — Encounter: Payer: Self-pay | Admitting: Internal Medicine

## 2022-10-30 ENCOUNTER — Other Ambulatory Visit (HOSPITAL_COMMUNITY): Payer: Self-pay

## 2022-10-30 NOTE — Telephone Encounter (Signed)
Pharmacy Patient Advocate Encounter   Received notification that prior authorization for Prolia is required/requested.  Per Test Claim: Prior authorization required   PA submitted on 10/30/22 to (ins) [employer] Medicare Part D via CoverMyMeds Key [id number] Status is pending

## 2022-11-01 ENCOUNTER — Other Ambulatory Visit (HOSPITAL_COMMUNITY): Payer: Self-pay

## 2022-11-01 ENCOUNTER — Other Ambulatory Visit: Payer: Self-pay | Admitting: Internal Medicine

## 2022-11-01 DIAGNOSIS — E785 Hyperlipidemia, unspecified: Secondary | ICD-10-CM

## 2022-11-01 MED ORDER — ROSUVASTATIN CALCIUM 20 MG PO TABS
20.0000 mg | ORAL_TABLET | Freq: Every day | ORAL | 1 refills | Status: DC
  Filled 2022-11-01 – 2023-02-03 (×2): qty 90, 90d supply, fill #0
  Filled 2023-06-24: qty 90, 90d supply, fill #1

## 2022-11-04 NOTE — Telephone Encounter (Signed)
Pharmacy Patient Advocate Encounter  Prior Authorization for PROLIA 60MG/ML [place] SOSY has been approved.     Effective dates: 02/07/20024 through 10/31/2023  Selinda Orion CPhT Rx Patient Advocate LETTER OF APPROVAL IS IN MEDIA

## 2022-11-06 ENCOUNTER — Ambulatory Visit: Payer: Medicare Other | Admitting: Podiatry

## 2022-11-06 ENCOUNTER — Encounter: Payer: Self-pay | Admitting: Podiatry

## 2022-11-06 VITALS — BP 124/74 | HR 75

## 2022-11-06 DIAGNOSIS — D2372 Other benign neoplasm of skin of left lower limb, including hip: Secondary | ICD-10-CM

## 2022-11-06 DIAGNOSIS — M79675 Pain in left toe(s): Secondary | ICD-10-CM | POA: Diagnosis not present

## 2022-11-06 DIAGNOSIS — B351 Tinea unguium: Secondary | ICD-10-CM

## 2022-11-06 DIAGNOSIS — M79674 Pain in right toe(s): Secondary | ICD-10-CM

## 2022-11-06 NOTE — Progress Notes (Signed)
  Subjective:  Patient ID: Taylor Ochoa, male    DOB: 1956/09/08,  MRN: [id number]  Chief Complaint  Patient presents with   Nail Problem    "Trim my toenails and my plantars wart is getting bigger."    67 y.o. male presents with the above complaint. History confirmed with patient.  His nails are significantly thickened elongated and causing quite a bit of pain.  He is unable to cut them.  He has multiple lesions on both feet that are quite painful, these have returned and are more painful than ever.  He had a recent hip fracture and replacement  Objective:  Physical Exam: warm, good capillary refill, no trophic changes or ulcerative lesions, normal DP and PT pulses, and normal sensory exam.  Multiple left foot painful hyperkeratotic lesions noted, benign-appearing in nature, tender to touch, lesions present on lateral fifth toe, submetatarsal 1, posterior lateral heel Left Foot: dystrophic yellowed discolored nail plates with subungual debris Right Foot: dystrophic yellowed discolored nail plates with subungual debris  Assessment:   1. Pain due to onychomycosis of toenails of both feet   2. Benign neoplasm of skin of left lower extremity      Plan:  Patient was evaluated and treated and all questions answered.  Discussed the etiology and treatment options for the condition in detail with the patient. Educated patient on the topical and oral treatment options for mycotic nails. Recommended debridement of the nails today. Sharp and mechanical debridement performed of all painful and mycotic nails today. Nails debrided in length and thickness using a nail nipper to level of comfort. Discussed treatment options including appropriate shoe gear. Follow up as needed for painful nails.  Discussed etiology and treatment of the skin lesions in detail with the patient as well as multiple treatment options including blistering agents, chemotherapeutic agents, surgical excision, laser therapy and  the indications and roles of the above.  Today, recommended treatment with salicylic acid as noted in procedure note below.  Recommend he use salicylic acid 32-67% at home  Procedure: Destruction of Lesion Location: Left foot Instrumentation: 15 blade. Technique: Debridement of lesion to petechial bleeding. Aperture pad applied around lesion. Small amount of salinocaine applied to the base of the lesion. Dressing: Dry, sterile, compression dressing. Disposition: Patient tolerated procedure well. Advised to leave dressing on for 12 hours. Thereafter patient to wash the area with soap and water and applied band-aid.    Return in about 3 months (around 02/04/2023) for painful thick fungal nails, wart treatment.

## 2022-11-06 NOTE — Patient Instructions (Signed)
Buy salicylic acid [id number] strength cream and apply to the lesions every day until they go away

## 2022-11-11 ENCOUNTER — Other Ambulatory Visit: Payer: Self-pay | Admitting: Internal Medicine

## 2022-11-11 ENCOUNTER — Other Ambulatory Visit (HOSPITAL_COMMUNITY): Payer: Self-pay

## 2022-11-11 MED ORDER — PANTOPRAZOLE SODIUM 40 MG PO TBEC
40.0000 mg | DELAYED_RELEASE_TABLET | Freq: Every day | ORAL | 0 refills | Status: DC
  Filled 2022-11-11: qty 90, 90d supply, fill #0

## 2022-11-13 ENCOUNTER — Ambulatory Visit (AMBULATORY_SURGERY_CENTER): Payer: Medicare Other | Admitting: *Deleted

## 2022-11-13 VITALS — Ht 67.0 in | Wt 155.0 lb

## 2022-11-13 DIAGNOSIS — Z1211 Encounter for screening for malignant neoplasm of colon: Secondary | ICD-10-CM

## 2022-11-13 NOTE — Progress Notes (Signed)
No egg or soy allergy known to patient  No issues known to pt with past sedation with any surgeries or procedures Patient denies ever being told they had issues or difficulty with intubation  No FH of Malignant Hyperthermia Pt is not on diet pills Pt is not on  home 02  Pt is not on blood thinners  Pt denies issues with constipation  Pt is not on dialysis Pt denies any upcoming cardiac testing Pt encouraged to use to use Singlecare or Goodrx to reduce cost  Patient's chart reviewed by Osvaldo Angst CNRA prior to previsit and patient appropriate for the [place].  Previsit completed and red dot placed by patient's name on their procedure day (on provider's schedule).  . Visit by phone Instructions reviewed with pt and pt states understanding. Instructed to review again prior to procedure. Pt states they will.

## 2022-11-14 ENCOUNTER — Ambulatory Visit (INDEPENDENT_AMBULATORY_CARE_PROVIDER_SITE_OTHER): Payer: Medicare Other

## 2022-11-14 DIAGNOSIS — D510 Vitamin B12 deficiency anemia due to intrinsic factor deficiency: Secondary | ICD-10-CM | POA: Diagnosis not present

## 2022-11-14 MED ORDER — CYANOCOBALAMIN 1000 MCG/ML IJ SOLN
1000.0000 ug | Freq: Once | INTRAMUSCULAR | Status: AC
  Administered 2022-11-14: 1000 ug via INTRAMUSCULAR

## 2022-11-14 NOTE — Progress Notes (Signed)
Pt here for monthly B12 injection per Dr. Ronnald Ramp  B12 1048mg given IM, and pt tolerated injection well.  Next B12 injection scheduled for 12/13/22

## 2022-11-19 ENCOUNTER — Encounter: Payer: Self-pay | Admitting: Internal Medicine

## 2022-12-04 ENCOUNTER — Encounter: Payer: Self-pay | Admitting: Internal Medicine

## 2022-12-04 ENCOUNTER — Ambulatory Visit (AMBULATORY_SURGERY_CENTER): Payer: Medicare Other | Admitting: Internal Medicine

## 2022-12-04 VITALS — BP 123/81 | HR 79 | Temp 97.3°F | Resp 16 | Ht 67.0 in | Wt 155.0 lb

## 2022-12-04 DIAGNOSIS — D125 Benign neoplasm of sigmoid colon: Secondary | ICD-10-CM

## 2022-12-04 DIAGNOSIS — K5520 Angiodysplasia of colon without hemorrhage: Secondary | ICD-10-CM

## 2022-12-04 DIAGNOSIS — D123 Benign neoplasm of transverse colon: Secondary | ICD-10-CM

## 2022-12-04 DIAGNOSIS — K635 Polyp of colon: Secondary | ICD-10-CM | POA: Diagnosis not present

## 2022-12-04 DIAGNOSIS — D128 Benign neoplasm of rectum: Secondary | ICD-10-CM | POA: Diagnosis not present

## 2022-12-04 DIAGNOSIS — Z1211 Encounter for screening for malignant neoplasm of colon: Secondary | ICD-10-CM

## 2022-12-04 HISTORY — DX: Angiodysplasia of colon without hemorrhage: K55.20

## 2022-12-04 MED ORDER — SODIUM CHLORIDE 0.9 % IV SOLN: 500.0000 mL | Freq: Once | INTRAVENOUS | Status: DC

## 2022-12-04 NOTE — Patient Instructions (Addendum)
I removed one medium and three tiny polyps - all look benign. There was a single lesion called angiodysplasia or an AVM - these usually do not cause problems. Can be treated if cause bleeding problems. All else ok.  I will let you know pathology results and when to have another routine colonoscopy by mail and/or My Chart.  I appreciate the opportunity to care for you. Gatha Mayer, MD, FACG   NO ASPIRIN,IBUPROFEN,OR OTHER NON STEROIDAL  ANTIINFLAMMATORY DRUGS FOR 2 WEEKS AFTER  POLYP REMOVAL   Handout on polyps given to you today  Await pathology results on polyps removed   CONTINUE PRESENT MEDICATIONS AND USUAL DIET     YOU HAD AN ENDOSCOPIC PROCEDURE TODAY AT [facility]:   Refer to the procedure report that was given to you for any specific questions about what was found during the examination.  If the procedure report does not answer your questions, please call your gastroenterologist to clarify.  If you requested that your care partner not be given the details of your procedure findings, then the procedure report has been included in a sealed envelope for you to review at your convenience later.  YOU SHOULD EXPECT: Some feelings of bloating in the abdomen. Passage of more gas than usual.  Walking can help get rid of the air that was put into your GI tract during the procedure and reduce the bloating. If you had a lower endoscopy (such as a colonoscopy or flexible sigmoidoscopy) you may notice spotting of blood in your stool or on the toilet paper. If you underwent a bowel prep for your procedure, you may not have a normal bowel movement for a few days.  Please Note:  You might notice some irritation and congestion in your nose or some drainage.  This is from the oxygen used during your procedure.  There is no need for concern and it should clear up in a day or so.  SYMPTOMS TO REPORT IMMEDIATELY:  Following lower endoscopy (colonoscopy or flexible  sigmoidoscopy):  Excessive amounts of blood in the stool  Significant tenderness or worsening of abdominal pains  Swelling of the abdomen that is new, acute  Fever of 100F or higher   For urgent or emergent issues, a gastroenterologist can be reached at any hour by calling [phone]. Do not use MyChart messaging for urgent concerns.    DIET:  We do recommend a small meal at first, but then you may proceed to your regular diet.  Drink plenty of fluids but you should avoid alcoholic beverages for 24 hours.  ACTIVITY:  You should plan to take it easy for the rest of today and you should NOT DRIVE or use heavy machinery until tomorrow (because of the sedation medicines used during the test).    FOLLOW UP: Our staff will call the number listed on your records the next business day following your procedure.  We will call around 7:15- 8:00 am to check on you and address any questions or concerns that you may have regarding the information given to you following your procedure. If we do not reach you, we will leave a message.     If any biopsies were taken you will be contacted by phone or by letter within the next 1-3 weeks.  Please call us at [phone] if you have not heard about the biopsies in 3 weeks.    SIGNATURES/CONFIDENTIALITY: You and/or your care partner have signed paperwork which will be entered  into your electronic medical record.  These signatures attest to the fact that that the information above on your After Visit Summary has been reviewed and is understood.  Full responsibility of the confidentiality of this discharge information lies with you and/or your care-partner.
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
## 2022-12-04 NOTE — Progress Notes (Signed)
VS by [facility]  Pt's states no medical or surgical changes since previsit or office visit.

## 2022-12-04 NOTE — Op Note (Signed)
[facility] Patient Name: Taylor Ochoa Procedure Date: 12/04/2022 1:31 PM MRN: [id number] Endoscopist: Gatha Mayer , MD, [id number] Age: 67 Referring MD:  Date of Birth: 12-Nov-1955 Gender: Male Account #: [id number] Procedure:                Colonoscopy Medicines:                Monitored Anesthesia Care Procedure:                Pre-Anesthesia Assessment:                           - Prior to the procedure, a History and Physical                            was performed, and patient medications and                            allergies were reviewed. The patient's tolerance of                            previous anesthesia was also reviewed. The risks                            and benefits of the procedure and the sedation                            options and risks were discussed with the patient.                            All questions were answered, and informed consent                            was obtained. Prior Anticoagulants: The patient has                            taken no anticoagulant or antiplatelet agents. ASA                            Grade Assessment: III - A patient with severe                            systemic disease. After reviewing the risks and                            benefits, the patient was deemed in satisfactory                            condition to undergo the procedure.                           After obtaining informed consent, the colonoscope                            was passed under direct vision. Throughout the  procedure, the patient's blood pressure, pulse, and                            oxygen saturations were monitored continuously. The                            Olympus SN [id number] was introduced through the anus                            and advanced to the the cecum, identified by                            appendiceal orifice and ileocecal valve. The                            colonoscopy was  performed without difficulty. The                            patient tolerated the procedure well. The quality                            of the bowel preparation was good. The ileocecal                            valve, appendiceal orifice, and rectum were                            photographed. The bowel preparation used was                            Miralax via split dose instruction. Scope In: 1:37:47 PM Scope Out: 1:55:38 PM Scope Withdrawal Time: 0 hours 15 minutes 2 seconds  Total Procedure Duration: 0 hours 17 minutes 51 seconds  Findings:                 The perianal and digital rectal examinations were                            normal. Pertinent negatives include normal prostate                            (size, shape, and consistency).                           A 15 to 20 mm polyp was found in the rectum. The                            polyp was sessile. The polyp was removed with a                            piecemeal technique using a hot snare. Resection                            and retrieval were complete. Verification  of                            patient identification for the specimen was done.                            Estimated blood loss was minimal.                           Three sessile polyps were found in the sigmoid                            colon and transverse colon. The polyps were                            diminutive in size. These polyps were removed with                            a cold snare. Resection and retrieval were                            complete. Verification of patient identification                            for the specimen was done. Estimated blood loss was                            minimal.                           A single small angiodysplastic lesion without                            bleeding was found in the cecum.                           The exam was otherwise without abnormality on                            direct and  retroflexion views. Complications:            No immediate complications. Estimated Blood Loss:     Estimated blood loss was minimal. Impression:               - One 15 to 20 mm polyp in the rectum, removed                            piecemeal using a hot snare. Resected and retrieved.                           - Three diminutive polyps in the sigmoid colon and                            in the transverse colon, removed with a cold snare.  Resected and retrieved.                           - A single non-bleeding colonic angiodysplastic                            lesion.                           - The examination was otherwise normal on direct                            and retroflexion views. Recommendation:           - Patient has a contact number available for                            emergencies. The signs and symptoms of potential                            delayed complications were discussed with the                            patient. Return to normal activities tomorrow.                            Written discharge instructions were provided to the                            patient.                           - Resume previous diet.                           - Continue present medications.                           - No aspirin, ibuprofen, naproxen, or other                            non-steroidal anti-inflammatory drugs for 2 weeks                            after polyp removal. Gatha Mayer, MD 12/04/2022 2:05:15 PM This report has been signed electronically.

## 2022-12-04 NOTE — Progress Notes (Signed)
Uneventful anesthetic. Report to pacu rn. Vss. Care resumed by rn. 

## 2022-12-05 ENCOUNTER — Telehealth: Payer: Self-pay

## 2022-12-05 NOTE — Telephone Encounter (Signed)
  Follow up Call-     12/04/2022   12:46 PM  Call back number  Post procedure Call Back phone  # [phone]  Permission to leave phone message Yes     Patient questions:  Do you have a fever, pain , or abdominal swelling? No. Pain Score  0 *  Have you tolerated food without any problems? Yes.    Have you been able to return to your normal activities? Yes.    Do you have any questions about your discharge instructions: Diet   No. Medications  No. Follow up visit  No.  Do you have questions or concerns about your Care? No.  Actions: * If pain score is 4 or above: No action needed, pain <4.

## 2022-12-13 ENCOUNTER — Ambulatory Visit: Payer: Medicare Other

## 2022-12-17 ENCOUNTER — Encounter: Payer: Self-pay | Admitting: Internal Medicine

## 2022-12-17 DIAGNOSIS — Z860101 Personal history of adenomatous and serrated colon polyps: Secondary | ICD-10-CM

## 2022-12-17 DIAGNOSIS — Z86010 Personal history of colonic polyps: Secondary | ICD-10-CM | POA: Insufficient documentation

## 2022-12-17 HISTORY — DX: Personal history of adenomatous and serrated colon polyps: Z86.0101

## 2022-12-18 ENCOUNTER — Ambulatory Visit (INDEPENDENT_AMBULATORY_CARE_PROVIDER_SITE_OTHER): Payer: Medicare Other | Admitting: Internal Medicine

## 2022-12-18 ENCOUNTER — Other Ambulatory Visit (HOSPITAL_COMMUNITY): Payer: Self-pay

## 2022-12-18 ENCOUNTER — Other Ambulatory Visit: Payer: Self-pay

## 2022-12-18 ENCOUNTER — Encounter: Payer: Self-pay | Admitting: Internal Medicine

## 2022-12-18 VITALS — BP 118/72 | HR 69 | Temp 98.7°F | Ht 67.0 in | Wt 156.0 lb

## 2022-12-18 DIAGNOSIS — Z720 Tobacco use: Secondary | ICD-10-CM | POA: Diagnosis not present

## 2022-12-18 DIAGNOSIS — D539 Nutritional anemia, unspecified: Secondary | ICD-10-CM

## 2022-12-18 DIAGNOSIS — M8000XA Age-related osteoporosis with current pathological fracture, unspecified site, initial encounter for fracture: Secondary | ICD-10-CM

## 2022-12-18 DIAGNOSIS — D510 Vitamin B12 deficiency anemia due to intrinsic factor deficiency: Secondary | ICD-10-CM

## 2022-12-18 DIAGNOSIS — L230 Allergic contact dermatitis due to metals: Secondary | ICD-10-CM | POA: Diagnosis not present

## 2022-12-18 MED ORDER — HYDROXYZINE HCL 10 MG PO TABS
10.0000 mg | ORAL_TABLET | Freq: Three times a day (TID) | ORAL | 0 refills | Status: DC | PRN
  Filled 2022-12-18: qty 30, 10d supply, fill #0

## 2022-12-18 MED ORDER — FLUOCINONIDE EMULSIFIED BASE 0.05 % EX CREA
1.0000 | TOPICAL_CREAM | Freq: Two times a day (BID) | CUTANEOUS | 1 refills | Status: DC
  Filled 2022-12-18: qty 60, 15d supply, fill #0

## 2022-12-18 MED ORDER — CYANOCOBALAMIN 1000 MCG/ML IJ SOLN
1000.0000 ug | Freq: Once | INTRAMUSCULAR | Status: AC
  Administered 2022-12-18: 1000 ug via INTRAMUSCULAR

## 2022-12-18 NOTE — Progress Notes (Signed)
Subjective:  Patient ID: Taylor Ochoa, male    DOB: 09/28/1955  Age: 67 y.o. MRN: [id number]  CC: No chief complaint on file.      HPI Taylor Ochoa presents for f/up ----  He complains of a 3 week history of red/itchy rash over his left wrist where he wears his watch.  Outpatient Medications Prior to Visit  Medication Sig Dispense Refill   acetaminophen (TYLENOL) 325 MG tablet Take 2 tablets (650 mg total) by mouth every 4 (four) hours as needed for mild pain or moderate pain (or Fever >/= 101).     Aspirin 81 MG CAPS Take 1 capsule every day by oral route.     Cholecalciferol 50 MCG (2000 UT) TABS Take 1 tablet (2,000 Units total) by mouth daily. 90 tablet 1   gabapentin (NEURONTIN) 600 MG tablet Take 1 tablet (600 mg total) by mouth 3 (three) times daily. 270 tablet 0   pantoprazole (PROTONIX) 40 MG tablet Take 1 tablet (40 mg total) by mouth daily. 90 tablet 0   rosuvastatin (CRESTOR) 20 MG tablet Take 1 tablet (20 mg total) by mouth daily. 90 tablet 1   sertraline (ZOLOFT) 100 MG tablet Take 1 tablet (100 mg total) by mouth daily. 90 tablet 1   tamsulosin (FLOMAX) 0.4 MG CAPS capsule Take 1 capsule (0.4 mg total) by mouth at bedtime. 30 capsule 11   thiamine (VITAMIN B1) 100 MG tablet Take 1 tablet (100 mg total) by mouth daily. 90 tablet 0   Tiotropium Bromide Monohydrate (SPIRIVA RESPIMAT) 1.25 MCG/ACT AERS Inhale 2 puffs into the lungs daily. 4 g 5   No facility-administered medications prior to visit.    ROS Review of Systems  Constitutional: Negative.  Negative for diaphoresis and fatigue.  HENT: Negative.    Eyes: Negative.   Respiratory:  Positive for cough and shortness of breath. Negative for chest tightness and wheezing.   Cardiovascular:  Negative for chest pain, palpitations and leg swelling.  Gastrointestinal:  Negative for abdominal pain, constipation, diarrhea, nausea and vomiting.  Endocrine: Negative.   Genitourinary: Negative.  Negative for  difficulty urinating.  Musculoskeletal: Negative.   Skin:  Positive for color change and rash.  Neurological:  Negative for dizziness, weakness and numbness.  Hematological:  Negative for adenopathy. Does not bruise/bleed easily.  Psychiatric/Behavioral: Negative.      Objective:  BP 118/72 (BP Location: Left Arm, Patient Position: Sitting, Cuff Size: Large)   Pulse 69   Temp 98.7 F (37.1 C) (Oral)   Ht 5\' 7"  (1.702 m)   Wt 156 lb (70.8 kg)   SpO2 95%   BMI 24.43 kg/m   BP Readings from Last 3 Encounters:  12/18/22 118/72  12/04/22 123/81  11/06/22 124/74    Wt Readings from Last 3 Encounters:  12/18/22 156 lb (70.8 kg)  12/04/22 155 lb (70.3 kg)  11/13/22 155 lb (70.3 kg)    Physical Exam Constitutional:      General: He is not in acute distress.    Appearance: He is ill-appearing. He is not toxic-appearing or diaphoretic.  HENT:     Nose: Nose normal.     Mouth/Throat:     Mouth: Mucous membranes are moist.  Eyes:     General: No scleral icterus.    Conjunctiva/sclera: Conjunctivae normal.  Cardiovascular:     Rate and Rhythm: Normal rate and regular rhythm.     Heart sounds: No murmur heard. Pulmonary:     Effort: Pulmonary effort  is normal.     Breath sounds: No stridor. No wheezing, rhonchi or rales.  Abdominal:     General: Abdomen is flat.     Palpations: There is no mass.     Tenderness: There is no abdominal tenderness. There is no guarding.     Hernia: No hernia is present.  Musculoskeletal:        General: Normal range of motion.     Right lower leg: No edema.     Left lower leg: No edema.  Lymphadenopathy:     Cervical: No cervical adenopathy.  Skin:    Findings: Erythema and rash present.  Neurological:     General: No focal deficit present.     Mental Status: He is alert.  Psychiatric:        Mood and Affect: Mood normal.        Behavior: Behavior normal.     Lab Results  Component Value Date   WBC 9.4 09/18/2022   HGB 14.4  09/18/2022   HCT 43.1 09/18/2022   PLT 241.0 09/18/2022   GLUCOSE 113 (H) 09/18/2022   CHOL 174 07/25/2021   TRIG 160.0 (H) 07/25/2021   HDL 51.40 07/25/2021   LDLCALC 91 07/25/2021   ALT 14 07/15/2022   AST 22 07/15/2022   NA 142 09/18/2022   K 4.3 09/18/2022   CL 105 09/18/2022   CREATININE 1.02 09/18/2022   BUN 21 09/18/2022   CO2 28 09/18/2022   TSH 0.96 09/18/2022   PSA 3.20 09/18/2022   INR 1.0 07/14/2022   HGBA1C 5.8 09/18/2022    DG Bone Density  Result Date: 09/26/2022 EXAM: DUAL X-RAY ABSORPTIOMETRY (DXA) FOR BONE MINERAL DENSITY IMPRESSION: Referring Physician:  Janith Lima Your patient completed a bone mineral density test using GE Lunar iDXA system (analysis version: 16). Technologist: lmn PATIENT: Name: Taylor Ochoa, Taylor Ochoa Patient ID: [id number] Birth Date: Aug 26, 1956 Height: 67.0 in. Sex: Male Measured: 09/26/2022 Weight: 151.0 lbs. Indications: COPD, Gabapentin, Pantoprazole, Right hip replacement, Tobacco User (Current Smoker) Fractures: Clavicle Treatments: Vitamin D (E933.5) ASSESSMENT: The BMD measured at Femur Total is 0.662 g/cm2 with a T-score of -2.7. This patient is considered osteoporotic according to [facility]) criteria The quality of the exam is good. Right hip excluded due to surgical hardware. Site Region Measured Date Measured Age [age]A T-score BMD Significant CHANGE AP Spine L1-L4 09/26/2022 66.3 -2.1 0.927 g/cm2 Left Femur Total 09/26/2022 66.3 -2.7 0.662 g/cm2 World [employer] [facility]) criteria for post-menopausal, Caucasian Women: Normal T-score at or above -1 SD Osteopenia T-score between -1 and -2.5 SD Osteoporosis T-score at or below -2.5 SD RECOMMENDATION: [place] recommends that FDA-approved medical therapies be considered in postmenopausal women and men age 25 or older with a: 1. Hip or vertebral (clinical or morphometric) fracture. 2. T-score of less than or equal to -2.5 at the spine or hip. 3. Ten-year  fracture probability by FRAX of 3% or greater for hip fracture or 20% or greater for major osteoporotic fracture. All treatment decisions require clinical judgment and consideration of individual patient factors, including patient preferences, co-morbidities, previous drug use, risk factors not captured in the FRAX model (e.g. falls, vitamin D deficiency, increased bone turnover, interval significant decline in bone density) and possible under- or over-estimation of fracture risk by FRAX. All patients should ensure an adequate intake of dietary calcium (1200 mg/d) and vitamin D (800 IU daily) unless contraindicated. FOLLOW-UP: People with diagnosed cases of osteoporosis or osteopenia should be regularly  tested for bone mineral density. For patients eligible for Medicare, routine testing is allowed once every 2 years. The testing frequency can be increased to one year for patients who have rapidly progressing disease, or for those who are receiving medical therapy to restore bone mass. I have reviewed this study and agree with the findings. [facility] Electronically Signed   By: Zerita Boers M.D.   On: 09/26/2022 08:01    Assessment & Plan:  Allergic contact dermatitis due to metals -     Fluocinonide Emulsified Base; Apply 1 Application topically 2 (two) times daily.  Dispense: 60 g; Refill: 1 -     hydrOXYzine HCl; Take 1 tablet (10 mg total) by mouth every 8 (eight) hours as needed.  Dispense: 30 tablet; Refill: 0  Age-related osteoporosis with current pathological fracture, initial encounter - He is not yet willing to start prolia.  Deficiency anemia -     Cyanocobalamin  Tobacco abuse -     Ambulatory Referral for Lung Cancer Scre  Vitamin B12 deficiency anemia due to intrinsic factor deficiency- Will continue parenteral B12 replacement therapy.     Follow-up: Return in about 3 months (around 03/20/2023).  Scarlette Calico, MD

## 2022-12-18 NOTE — Patient Instructions (Signed)
Contact Dermatitis Dermatitis is redness, soreness, and swelling (inflammation) of the skin. Contact dermatitis is a reaction to certain substances that touch the skin. There are two types of this condition: Irritant contact dermatitis. This is the most common type. It happens when something irritates your skin, such as when your hands get dry from washing them too often with soap. You can get this type of reaction even if you have not been exposed to the irritant before. Allergic contact dermatitis. This type is caused by a substance that you are allergic to, such as poison ivy. It occurs when you have been exposed to the substance (allergen) and form a sensitivity to it. In some cases, the reaction may start soon after your first exposure to the allergen. In other cases, it may not start until you are exposed to the allergen again. It may then occur every time you are exposed to the allergen in the future. What are the causes? Irritant contact dermatitis is often caused by exposure to: Makeup. Soaps, detergents, and bleaches. Acids. Metal salts, such as nickel. Allergic contact dermatitis is often caused by exposure to: Poisonous plants. Chemicals. Jewelry. Latex. Medicines. Preservatives in products, such as clothes. What increases the risk? You are more likely to get this condition if you have: A job that exposes you to irritants or allergens. Certain medical conditions. These include asthma and eczema. What are the signs or symptoms? Symptoms of this condition may occur in any place on your body that has been touched by the irritant. Symptoms include: Dryness, flaking, or cracking. Redness. Itching. Pain or a burning feeling. Blisters. Drainage of small amounts of blood or clear fluid from skin cracks. With allergic contact dermatitis, there may also be swelling in areas such as the eyelids, mouth, or genitals. How is this diagnosed? This condition is diagnosed with a medical  history and physical exam. A patch skin test may be done to help figure out the cause. If the condition is related to your job, you may need to see an expert in health problems in the workplace (occupational medicine specialist). How is this treated? This condition is treated by staying away from the cause of the reaction and protecting your skin from further contact. Treatment may also include: Steroid creams or ointments. Steroid medicines may need be taken by mouth (orally) in more severe cases. Antibiotics or medicines applied to the skin to kill bacteria (antibacterial ointments). These may be needed if a skin infection is present. Antihistamines. These may be taken orally or put on as a lotion to ease itching. A bandage (dressing). Follow these instructions at home: Skin care Moisturize your skin as needed. Put cool, wet cloths (cool compresses) on the affected areas. Try applying baking soda paste to your skin. Stir water into baking soda until it has the consistency of a paste. Do not scratch your skin. Avoid friction to the affected area. Avoid the use of soaps, perfumes, and dyes. Check the affected areas every day for signs of infection. Check for: More redness, swelling, or pain. More fluid or blood. Warmth. Pus or a bad smell. Medicines Take or apply over-the-counter and prescription medicines only as told by your health care provider. If you were prescribed antibiotics, take or apply them as told by your health care provider. Do not stop using the antibiotic even if you start to feel better. Bathing Try taking a bath with: Epsom salts. Follow the instructions on the packaging. You can get these at your local pharmacy   or grocery store. Baking soda. Pour a small amount into the bath as told by your health care provider. Colloidal oatmeal. Follow the instructions on the packaging. You can get this at your local pharmacy or grocery store. Bathe less often. This may mean bathing  every other day. Bathe in lukewarm water. Avoid using hot water. Bandage care If you were given a dressing, change it as told by your health care provider. Wash your hands with soap and water for at least 20 seconds before and after you change your dressing. If soap and water are not available, use hand sanitizer. General instructions Avoid the substance that caused your reaction. If you do not know what caused it, keep a journal to try to track what caused it. Write down: What you eat and drink. What cosmetics you use. What you wear in the affected area. This includes jewelry. Contact a health care provider if: Your condition does not get better with treatment. Your condition gets worse. You have any signs of infection. You have a fever. You have new symptoms. Your bone or joint under the affected area becomes painful after the skin has healed. Get help right away if: You notice red streaks coming from the affected area. The affected area turns darker. You have trouble breathing. This information is not intended to replace advice given to you by your health care provider. Make sure you discuss any questions you have with your health care provider. Document Revised: 03/15/2022 Document Reviewed: 03/15/2022 Elsevier Patient Education  2023 [employer].  

## 2022-12-31 ENCOUNTER — Other Ambulatory Visit (HOSPITAL_COMMUNITY): Payer: Self-pay

## 2022-12-31 ENCOUNTER — Other Ambulatory Visit: Payer: Self-pay | Admitting: Internal Medicine

## 2022-12-31 DIAGNOSIS — M5416 Radiculopathy, lumbar region: Secondary | ICD-10-CM

## 2022-12-31 MED ORDER — GABAPENTIN 600 MG PO TABS
600.0000 mg | ORAL_TABLET | Freq: Three times a day (TID) | ORAL | 0 refills | Status: DC
  Filled 2022-12-31: qty 270, 90d supply, fill #0

## 2023-01-17 ENCOUNTER — Ambulatory Visit (INDEPENDENT_AMBULATORY_CARE_PROVIDER_SITE_OTHER): Payer: Medicare Other

## 2023-01-17 DIAGNOSIS — D510 Vitamin B12 deficiency anemia due to intrinsic factor deficiency: Secondary | ICD-10-CM

## 2023-01-17 MED ORDER — CYANOCOBALAMIN 1000 MCG/ML IJ SOLN
1000.0000 ug | Freq: Once | INTRAMUSCULAR | Status: AC
  Administered 2023-01-17: 1000 ug via INTRAMUSCULAR

## 2023-01-17 NOTE — Progress Notes (Signed)
Pt was given B12 injection with no complications. 

## 2023-01-22 DIAGNOSIS — R3912 Poor urinary stream: Secondary | ICD-10-CM | POA: Diagnosis not present

## 2023-01-22 DIAGNOSIS — R351 Nocturia: Secondary | ICD-10-CM | POA: Diagnosis not present

## 2023-01-22 DIAGNOSIS — N401 Enlarged prostate with lower urinary tract symptoms: Secondary | ICD-10-CM | POA: Diagnosis not present

## 2023-02-03 ENCOUNTER — Other Ambulatory Visit: Payer: Self-pay | Admitting: Internal Medicine

## 2023-02-03 ENCOUNTER — Other Ambulatory Visit (HOSPITAL_COMMUNITY): Payer: Self-pay

## 2023-02-03 ENCOUNTER — Other Ambulatory Visit: Payer: Self-pay

## 2023-02-03 DIAGNOSIS — F411 Generalized anxiety disorder: Secondary | ICD-10-CM

## 2023-02-03 MED ORDER — SERTRALINE HCL 100 MG PO TABS
100.0000 mg | ORAL_TABLET | Freq: Every day | ORAL | 0 refills | Status: DC
  Filled 2023-02-03: qty 90, 90d supply, fill #0

## 2023-02-03 MED ORDER — PANTOPRAZOLE SODIUM 40 MG PO TBEC
40.0000 mg | DELAYED_RELEASE_TABLET | Freq: Every day | ORAL | 0 refills | Status: DC
  Filled 2023-02-03: qty 90, 90d supply, fill #0

## 2023-02-05 ENCOUNTER — Ambulatory Visit: Payer: Medicare Other | Admitting: Podiatry

## 2023-02-05 DIAGNOSIS — M79674 Pain in right toe(s): Secondary | ICD-10-CM

## 2023-02-05 DIAGNOSIS — B070 Plantar wart: Secondary | ICD-10-CM | POA: Diagnosis not present

## 2023-02-05 DIAGNOSIS — M79675 Pain in left toe(s): Secondary | ICD-10-CM

## 2023-02-05 DIAGNOSIS — B351 Tinea unguium: Secondary | ICD-10-CM

## 2023-02-05 NOTE — Progress Notes (Signed)
  Subjective:  Patient ID: Taylor Ochoa, male    DOB: 05-24-1956,  MRN: [id number]  Chief Complaint  Patient presents with   Nail Problem    Thick painful toenails, 3 month follow up   Plantar Warts    67 y.o. male presents with the above complaint. History confirmed with patient.  His nails are significantly thickened elongated and causing quite a bit of pain.  He is unable to cut them.  He has multiple lesions on both feet that are quite painful, these have returned and are more painful than ever.  He had a hard time finding the salicylic acid ointment  Objective:  Physical Exam: warm, good capillary refill, no trophic changes or ulcerative lesions, normal DP and PT pulses, and normal sensory exam.  Multiple left foot painful hyperkeratotic lesions noted, benign-appearing in nature, tender to touch, lesions present on lateral fifth toe, submetatarsal 1, posterior lateral heel Left Foot: dystrophic yellowed discolored nail plates with subungual debris Right Foot: dystrophic yellowed discolored nail plates with subungual debris  Assessment:   1. Verruca plantaris   2. Pain due to onychomycosis of toenails of both feet      Plan:  Patient was evaluated and treated and all questions answered.  Discussed the etiology and treatment options for the condition in detail with the patient. Educated patient on the topical and oral treatment options for mycotic nails. Recommended debridement of the nails today. Sharp and mechanical debridement performed of all painful and mycotic nails today. Nails debrided in length and thickness using a nail nipper to level of comfort. Discussed treatment options including appropriate shoe gear. Follow up as needed for painful nails.  Discussed etiology and treatment of the skin lesions in detail with the patient as well as multiple treatment options including blistering agents, chemotherapeutic agents, surgical excision, laser therapy and the indications and  roles of the above.  Today, recommended treatment with salicylic acid as noted in procedure note below.  Recommend he use salicylic acid at home, I recommended he look for this on [employer]  Procedure: Destruction of Lesion Location: Left foot Instrumentation: 15 blade. Technique: Debridement of lesion to petechial bleeding. Aperture pad applied around lesion. Small amount of salinocaine applied to the base of the lesion. Dressing: Dry, sterile, compression dressing. Disposition: Patient tolerated procedure well. Advised to leave dressing on for 12 hours. Thereafter patient to wash the area with soap and water and applied band-aid.    Return in about 3 months (around 05/08/2023) for painful thick fungal nails, wart.

## 2023-02-05 NOTE — Patient Instructions (Signed)
Look for salicylic acid 40% cream or ointment or medicated pads and apply to the thickened dry skin / calluses. This can be bought over the counter, at a pharmacy or online such as [employer]. This may also be called corn or wart remover

## 2023-02-19 ENCOUNTER — Ambulatory Visit (INDEPENDENT_AMBULATORY_CARE_PROVIDER_SITE_OTHER): Payer: Medicare Other

## 2023-02-19 DIAGNOSIS — E538 Deficiency of other specified B group vitamins: Secondary | ICD-10-CM | POA: Diagnosis not present

## 2023-02-19 MED ORDER — CYANOCOBALAMIN 1000 MCG/ML IJ SOLN
1000.0000 ug | Freq: Once | INTRAMUSCULAR | Status: AC
  Administered 2023-02-19: 1000 ug via INTRAMUSCULAR

## 2023-02-19 NOTE — Progress Notes (Signed)
After obtaining consent, and per orders of Dr. Jones, injection of B12 given by Dashonda Bonneau P Diella Gillingham. Patient instructed to report any adverse reaction to me immediately.  

## 2023-03-19 ENCOUNTER — Ambulatory Visit (INDEPENDENT_AMBULATORY_CARE_PROVIDER_SITE_OTHER): Payer: Medicare Other | Admitting: Internal Medicine

## 2023-03-19 ENCOUNTER — Encounter: Payer: Self-pay | Admitting: Internal Medicine

## 2023-03-19 ENCOUNTER — Other Ambulatory Visit (HOSPITAL_COMMUNITY): Payer: Self-pay

## 2023-03-19 VITALS — BP 124/60 | HR 74 | Temp 98.1°F | Ht 67.0 in | Wt 155.0 lb

## 2023-03-19 DIAGNOSIS — J411 Mucopurulent chronic bronchitis: Secondary | ICD-10-CM

## 2023-03-19 DIAGNOSIS — I480 Paroxysmal atrial fibrillation: Secondary | ICD-10-CM

## 2023-03-19 DIAGNOSIS — D510 Vitamin B12 deficiency anemia due to intrinsic factor deficiency: Secondary | ICD-10-CM | POA: Diagnosis not present

## 2023-03-19 DIAGNOSIS — J431 Panlobular emphysema: Secondary | ICD-10-CM

## 2023-03-19 DIAGNOSIS — E559 Vitamin D deficiency, unspecified: Secondary | ICD-10-CM

## 2023-03-19 DIAGNOSIS — E785 Hyperlipidemia, unspecified: Secondary | ICD-10-CM | POA: Diagnosis not present

## 2023-03-19 DIAGNOSIS — N401 Enlarged prostate with lower urinary tract symptoms: Secondary | ICD-10-CM

## 2023-03-19 DIAGNOSIS — M8000XA Age-related osteoporosis with current pathological fracture, unspecified site, initial encounter for fracture: Secondary | ICD-10-CM | POA: Diagnosis not present

## 2023-03-19 DIAGNOSIS — J449 Chronic obstructive pulmonary disease, unspecified: Secondary | ICD-10-CM

## 2023-03-19 DIAGNOSIS — N1831 Chronic kidney disease, stage 3a: Secondary | ICD-10-CM | POA: Insufficient documentation

## 2023-03-19 DIAGNOSIS — E519 Thiamine deficiency, unspecified: Secondary | ICD-10-CM

## 2023-03-19 DIAGNOSIS — R7303 Prediabetes: Secondary | ICD-10-CM

## 2023-03-19 DIAGNOSIS — N138 Other obstructive and reflux uropathy: Secondary | ICD-10-CM

## 2023-03-19 LAB — BASIC METABOLIC PANEL
BUN: 16 mg/dL (ref 6–23)
CO2: 28 mEq/L (ref 19–32)
Calcium: 9.2 mg/dL (ref 8.4–10.5)
Chloride: 105 mEq/L (ref 96–112)
Creatinine, Ser: 1.15 mg/dL (ref 0.40–1.50)
GFR: 66.16 mL/min (ref >60.00)
Glucose, Bld: 119 mg/dL — ABNORMAL HIGH (ref 70–99)
Potassium: 4.5 mEq/L (ref 3.5–5.1)
Sodium: 142 mEq/L (ref 135–145)

## 2023-03-19 LAB — LIPID PANEL
Cholesterol: 124 mg/dL (ref 0–200)
HDL: 45.8 mg/dL (ref >39.00)
LDL Cholesterol: 58 mg/dL (ref 0–99)
NonHDL: 78.38
Total CHOL/HDL Ratio: 3
Triglycerides: 104 mg/dL (ref 0.0–149.0)
VLDL: 20.8 mg/dL (ref 0.0–40.0)

## 2023-03-19 LAB — HEMOGLOBIN A1C: Hgb A1c MFr Bld: 6.1 % (ref 4.6–6.5)

## 2023-03-19 LAB — PHOSPHORUS: Phosphorus: 3.1 mg/dL (ref 2.3–4.6)

## 2023-03-19 LAB — VITAMIN D 25 HYDROXY (VIT D DEFICIENCY, FRACTURES): VITD: 45.61 ng/mL (ref 30.00–100.00)

## 2023-03-19 LAB — PSA: PSA: 3.13 ng/mL (ref 0.10–4.00)

## 2023-03-19 MED ORDER — CYANOCOBALAMIN 1000 MCG/ML IJ SOLN
1000.0000 ug | Freq: Once | INTRAMUSCULAR | Status: AC
Start: 2023-03-19 — End: 2023-03-19
  Administered 2023-03-19: 1000 ug via INTRAMUSCULAR

## 2023-03-19 MED ORDER — THIAMINE HCL 100 MG PO TABS
100.0000 mg | ORAL_TABLET | Freq: Every day | ORAL | 0 refills | Status: DC
Start: 2023-03-19 — End: 2024-06-09
  Filled 2023-03-19: qty 90, 90d supply, fill #0

## 2023-03-19 MED ORDER — SPIRIVA RESPIMAT 1.25 MCG/ACT IN AERS
2.0000 | INHALATION_SPRAY | Freq: Every day | RESPIRATORY_TRACT | 5 refills | Status: DC
Start: 2023-03-19 — End: 2024-06-09
  Filled 2023-03-19: qty 4, 30d supply, fill #0

## 2023-03-19 NOTE — Patient Instructions (Signed)
Chronic Obstructive Pulmonary Disease  Chronic obstructive pulmonary disease (COPD) is a long-term (chronic) condition that affects the lungs. COPD is a general term that can be used to describe many different lung problems that cause lung inflammation and limit airflow, including chronic bronchitis and emphysema. If you have COPD, your lung function will probably never return to normal. In most cases, it gets worse over time. However, there are steps you can take to slow the progression of the disease and improve your quality of life. What are the causes? This condition may be caused by: Smoking. This is the most common cause. Certain genes passed down through families. What increases the risk? The following factors may make you more likely to develop this condition: Being exposed to secondhand smoke from cigarettes, pipes, or cigars. Being exposed to chemicals and other irritants, such as fumes and dust in the work environment. Having chronic lung conditions or infections. What are the signs or symptoms? Symptoms of this condition include: Shortness of breath, especially during physical activity. Chronic cough with a large amount of thick mucus. Sometimes, the cough may not have any mucus (dry cough). Wheezing and rapid breathing. Gray or bluish discoloration (cyanosis) of the skin, especially in the fingers, toes, or lips. Feeling tired (fatigue). Weight loss. Chest tightness. Frequent infections. Episodes when breathing symptoms become much worse (exacerbations). At the later stages of this disease, you may have swelling in the ankles, feet, or legs. How is this diagnosed? This condition is diagnosed based on: Your medical history. A physical exam. You may also have tests, including: Lung (pulmonary) function tests. This may include a spirometry test, which measures your ability to exhale properly. Chest X-ray. CT scan. Blood tests. How is this treated? This condition may be  treated with: Medicines. These may include inhaled rescue medicines to treat acute exacerbations as well as medicines that you take long-term (maintenance medicines) to prevent flare-ups of COPD. Bronchodilators help treat COPD by dilating the airways to allow increased airflow and make your breathing more comfortable. Steroids can reduce airway inflammation and help prevent exacerbations. Smoking cessation. If you smoke, your health care provider may ask you to quit, and may also recommend therapy or replacement products to help you quit. Pulmonary rehabilitation. This may involve working with a team of health care providers and specialists, such as respiratory, occupational, and physical therapists. Exercise and physical activity. These are beneficial for nearly all people with COPD. Nutrition therapy to gain weight, if you are underweight. Oxygen. Supplemental oxygen therapy is only helpful if you have a low oxygen level in your blood (hypoxemia). Lung surgery or transplant. Palliative care. This is to help people with COPD feel comfortable when treatment is no longer working. Follow these instructions at home: Medicines Take over-the-counter and prescription medicines only as told by your health care provider. This includes inhaled medicines and pills. Talk to your health care provider before taking any cough or allergy medicines. You may need to avoid certain medicines that dry out your airways. Lifestyle If you smoke, the most important thing that you can do is to stop smoking. Continuing to smoke will cause the disease to progress faster. Do not use any products that contain nicotine or tobacco. These products include cigarettes, chewing tobacco, and vaping devices, such as e-cigarettes. If you need help quitting, ask your health care provider. Avoid exposure to things that irritate your lungs, such as smoke, chemicals, and fumes. Stay active, but balance activity with periods of rest.  Exercise and   physical activity will help you maintain your ability to do things you want to do. Learn and use relaxation techniques to manage stress and to control your breathing. Get the right amount of sleep and get quality sleep. Most adults need 7 or more hours per night. Eat healthy foods. Eating smaller, more frequent meals and resting before meals may help you maintain your strength. Controlled breathing Learn and use controlled breathing techniques as directed by your health care provider. Controlled breathing techniques include: Pursed lip breathing. Start by breathing in (inhaling) through your nose for 1 second. Then, purse your lips as if you were going to whistle and breathe out (exhale) through the pursed lips for 2 seconds. Diaphragmatic breathing. Start by putting one hand on your abdomen just above your waist. Inhale slowly through your nose. The hand on your abdomen should move out. Then purse your lips and exhale slowly. You should be able to feel the hand on your abdomen moving in as you exhale.  Controlled coughing Learn and use controlled coughing to clear mucus from your lungs. Controlled coughing is a series of short, progressive coughs. The steps of controlled coughing are: Lean your head slightly forward. Breathe in deeply using diaphragmatic breathing. Try to hold your breath for 3 seconds. Keep your mouth slightly open while coughing twice. Spit any mucus out into a tissue. Rest and repeat the steps once or twice as needed. General instructions Make sure you receive all the vaccines that your health care provider recommends, especially the pneumococcal and influenza vaccines. Preventing infection and hospitalization is very important when you have COPD. Drink enough fluid to keep your urine pale yellow, unless you have a medical condition that requires fluid restriction. Use oxygen therapy and pulmonary rehabilitation if told by your health care provider. If you  require home oxygen therapy, ask your health care provider whether you should purchase a pulse oximeter to measure your oxygen level at home. Work with your health care provider to develop a COPD action plan. This will help you know what steps to take if your condition gets worse. Keep other chronic health conditions under control as told by your health care provider. Avoid extreme temperature and humidity changes. Avoid contact with people who have an illness that spreads from person to person (is contagious), such as viral infections or pneumonia. Keep all follow-up visits. This is important. Contact a health care provider if: You are coughing up more mucus than usual. There is a change in the color or thickness of your mucus. Your breathing is more labored than usual. Your breathing is faster than usual. You have difficulty sleeping. You need to use your rescue medicines or inhalers more often than expected. You have trouble doing routine activities such as getting dressed or walking around the house. Get help right away if: You have shortness of breath while you are resting. You have shortness of breath that prevents you from: Being able to talk. Performing your usual physical activities. You have chest pain lasting longer than 5 minutes. Your skin color is more blue (cyanotic) than usual. You measure low oxygen saturations for longer than 5 minutes with a pulse oximeter. You have a fever. You feel too tired to breathe normally. These symptoms may represent a serious problem that is an emergency. Do not wait to see if the symptoms will go away. Get medical help right away. Call your local emergency services (911 in the [place] Do not drive yourself to the hospital. Summary Chronic obstructive   pulmonary disease (COPD) is a long-term (chronic) condition that affects the lungs. Your lung function will probably never return to normal. In most cases, it gets worse over time. However, there  are steps you can take to slow the progression of the disease and improve your quality of life. Treatment for COPD may include taking medicines, quitting smoking, pulmonary rehabilitation, and changes to diet and exercise. As the disease progresses, you may need oxygen therapy, a lung transplant, or palliative care. To help manage your condition, do not smoke, avoid exposure to things that irritate your lungs, stay up to date on all vaccines, and follow your health care provider's instructions for taking medicines. This information is not intended to replace advice given to you by your health care provider. Make sure you discuss any questions you have with your health care provider. Document Revised: 07/17/2020 Document Reviewed: 07/18/2020 Elsevier Patient Education  2024 [employer]  

## 2023-03-19 NOTE — Progress Notes (Signed)
Subjective:  Patient ID: Taylor Ochoa, male    DOB: 09-08-1956  Age: 67 y.o. MRN: [id number]  CC: COPD   HPI Taylor Ochoa presents for f/up ------  Discussed the use of AI scribe software for clinical note transcription with the patient, who gave verbal consent to proceed.  History of Present Illness   The patient, with a known history of osteoporosis and COPD, presents for a follow-up visit. He reports no recent exacerbation of symptoms, including no chest pain or palpitations, even while engaging in regular walking exercise of five to six miles. He denies any calf pain during these walks. He also denies any gastrointestinal symptoms such as nausea or vomiting.  He denies any recent symptoms of broken bones.  He reports coughing up clear phlegm, but no blood. He confirms that his inhalers are helping with his respiratory symptoms. However, it was noted that he does experience shortness of breath, which is usual for him, and has not worsened recently. He often needs to sit down to catch his breath after walking out the door at home.       Outpatient Medications Prior to Visit  Medication Sig Dispense Refill   acetaminophen (TYLENOL) 325 MG tablet Take 2 tablets (650 mg total) by mouth every 4 (four) hours as needed for mild pain or moderate pain (or Fever >/= 101).     Aspirin 81 MG CAPS Take 1 capsule every day by oral route.     Cholecalciferol 50 MCG (2000 UT) TABS Take 1 tablet (2,000 Units total) by mouth daily. 90 tablet 1   fluocinonide-emollient (LIDEX-E) 0.05 % cream Apply 1 Application topically 2 (two) times daily. 60 g 1   gabapentin (NEURONTIN) 600 MG tablet Take 1 tablet (600 mg total) by mouth 3 (three) times daily. 270 tablet 0   hydrOXYzine (ATARAX) 10 MG tablet Take 1 tablet (10 mg total) by mouth every 8 (eight) hours as needed. 30 tablet 0   pantoprazole (PROTONIX) 40 MG tablet Take 1 tablet (40 mg total) by mouth daily. 90 tablet 0   rosuvastatin (CRESTOR) 20 MG  tablet Take 1 tablet (20 mg total) by mouth daily. 90 tablet 1   sertraline (ZOLOFT) 100 MG tablet Take 1 tablet (100 mg total) by mouth daily. 90 tablet 0   tamsulosin (FLOMAX) 0.4 MG CAPS capsule Take 1 capsule (0.4 mg total) by mouth at bedtime. 30 capsule 11   thiamine (VITAMIN B1) 100 MG tablet Take 1 tablet (100 mg total) by mouth daily. 90 tablet 0   Tiotropium Bromide Monohydrate (SPIRIVA RESPIMAT) 1.25 MCG/ACT AERS Inhale 2 puffs into the lungs daily. 4 g 5   No facility-administered medications prior to visit.    ROS Review of Systems  Constitutional: Negative.  Negative for diaphoresis and fatigue.  HENT: Negative.    Eyes: Negative.   Respiratory:  Positive for cough and shortness of breath. Negative for chest tightness and wheezing.   Cardiovascular:  Negative for chest pain, palpitations and leg swelling.  Gastrointestinal: Negative.  Negative for constipation, diarrhea, nausea and vomiting.  Endocrine: Negative.   Genitourinary: Negative.  Negative for difficulty urinating.  Musculoskeletal: Negative.  Negative for arthralgias and myalgias.  Skin: Negative.  Negative for color change.  Neurological: Negative.  Negative for dizziness and weakness.  Hematological:  Negative for adenopathy. Does not bruise/bleed easily.  Psychiatric/Behavioral: Negative.      Objective:  BP 124/60 (BP Location: Right Arm, Patient Position: Sitting, Cuff Size: Large)  Pulse 74   Temp 98.1 F (36.7 C) (Oral)   Ht 5\' 7"  (1.702 m)   Wt 155 lb (70.3 kg)   SpO2 94%   BMI 24.28 kg/m   BP Readings from Last 3 Encounters:  03/19/23 124/60  12/18/22 118/72  12/04/22 123/81    Wt Readings from Last 3 Encounters:  03/19/23 155 lb (70.3 kg)  12/18/22 156 lb (70.8 kg)  12/04/22 155 lb (70.3 kg)    Physical Exam Vitals reviewed.  Constitutional:      General: He is not in acute distress.    Appearance: He is ill-appearing. He is not toxic-appearing or diaphoretic.  HENT:      Mouth/Throat:     Mouth: Mucous membranes are moist.  Eyes:     General: No scleral icterus.    Conjunctiva/sclera: Conjunctivae normal.  Cardiovascular:     Rate and Rhythm: Normal rate and regular rhythm.     Heart sounds: Normal heart sounds, S1 normal and S2 normal. No murmur heard.    No friction rub. No gallop.     Comments: EKG- NSR, 66 bpm Normal EKG Pulmonary:     Effort: Pulmonary effort is normal.     Breath sounds: Examination of the right-upper field reveals decreased breath sounds and rhonchi. Examination of the left-upper field reveals decreased breath sounds and rhonchi. Examination of the right-middle field reveals decreased breath sounds, wheezing and rhonchi. Examination of the left-middle field reveals decreased breath sounds, wheezing and rhonchi. Examination of the right-lower field reveals decreased breath sounds and rhonchi. Examination of the left-lower field reveals decreased breath sounds and rhonchi. Decreased breath sounds, wheezing and rhonchi present. No rales.     Comments: Faint exp wheezes/rhonchi Abdominal:     General: Abdomen is flat.     Palpations: There is no mass.     Tenderness: There is no abdominal tenderness. There is no guarding.     Hernia: No hernia is present.  Musculoskeletal:     Cervical back: Neck supple.     Right lower leg: No edema.     Left lower leg: No edema.  Lymphadenopathy:     Cervical: No cervical adenopathy.  Skin:    General: Skin is warm and dry.     Coloration: Skin is not pale.  Neurological:     General: No focal deficit present.     Mental Status: He is alert. Mental status is at baseline.  Psychiatric:        Mood and Affect: Mood normal.        Behavior: Behavior normal.     Lab Results  Component Value Date   WBC 9.4 09/18/2022   HGB 14.4 09/18/2022   HCT 43.1 09/18/2022   PLT 241.0 09/18/2022   GLUCOSE 119 (H) 03/19/2023   CHOL 124 03/19/2023   TRIG 104.0 03/19/2023   HDL 45.80 03/19/2023    LDLCALC 58 03/19/2023   ALT 14 07/15/2022   AST 22 07/15/2022   NA 142 03/19/2023   K 4.5 03/19/2023   CL 105 03/19/2023   CREATININE 1.15 03/19/2023   BUN 16 03/19/2023   CO2 28 03/19/2023   TSH 0.96 09/18/2022   PSA 3.13 03/19/2023   INR 1.0 07/14/2022   HGBA1C 6.1 03/19/2023    DG Bone Density  Result Date: 09/26/2022 EXAM: DUAL X-RAY ABSORPTIOMETRY (DXA) FOR BONE MINERAL DENSITY IMPRESSION: Referring Physician:  Etta Grandchild Your patient completed a bone mineral density test using [employer] system (analysis  version: 16). Technologist: [email or web address] PATIENT: Name: Taylor Ochoa, Taylor Ochoa Patient ID: [id number] Birth Date: Dec 19, 1955 Height: 67.0 in. Sex: Male Measured: 09/26/2022 Weight: 151.0 lbs. Indications: COPD, Gabapentin, Pantoprazole, Right hip replacement, Tobacco User (Current Smoker) Fractures: Clavicle Treatments: Vitamin D (E933.5) ASSESSMENT: The BMD measured at Femur Total is 0.662 g/cm2 with a T-score of -2.7. This patient is considered osteoporotic according to World Health Organization [facility]) criteria The quality of the exam is good. Right hip excluded due to surgical hardware. Site Region Measured Date Measured Age YA T-score BMD Significant CHANGE AP Spine L1-L4 09/26/2022 66.3 -2.1 0.927 g/cm2 Left Femur Total 09[age]26/2022 66.3 -2.7 0.662 g/cm2 World Health Organization [facility]) criteria for post-menopausal, Caucasian Women: Normal T-score at or above -1 SD Osteopenia T-score between -1 and -2.5 SD Osteoporosis T-score at or below -2.5 SD RECOMMENDATION: National Osteoporosis Foundation recommends that FDA-approved medical therapies be considered in postmenopausal women and men age 73 or older with a: 1. Hip or vertebral (clinical or morphometric) fracture. 2. T-score of less than or equal to -2.5 at the spine or hip. 3. Ten-year fracture probability by FRAX of 3% or greater for hip fracture or 20% or greater for major osteoporotic fracture. All treatment decisions require clinical judgment and  consideration of individual patient factors, including patient preferences, co-morbidities, previous drug use, risk factors not captured in the FRAX model (e.g. falls, vitamin D deficiency, increased bone turnover, interval significant decline in bone density) and possible under- or over-estimation of fracture risk by FRAX. All patients should ensure an adequate intake of dietary calcium (1200 mg/d) and vitamin D (800 IU daily) unless contraindicated. FOLLOW-UP: People with diagnosed cases of osteoporosis or osteopenia should be regularly tested for bone mineral density. For patients eligible for Medicare, routine testing is allowed once every 2 years. The testing frequency can be increased to one year for patients who have rapidly progressing disease, or for those who are receiving medical therapy to restore bone mass. I have reviewed this study and agree with the findings. [facility] Electronically Signed   By: Romona Curls M.D.   On: 09/26/2022 08:01    Assessment & Plan:   Hyperlipidemia with target LDL less than 130 - LDL goal achieved. Doing well on the statin  -     Lipid panel; Future -     Lipoprotein A (LPA); Future  Prediabetes -     Hemoglobin A1c; Future -     Basic metabolic panel; Future  Vitamin B12 deficiency anemia due to intrinsic factor deficiency -     Cyanocobalamin  Vitamin D deficiency disease -     VITAMIN D 25 Hydroxy (Vit-D Deficiency, Fractures); Future  Age-related osteoporosis with current pathological fracture, initial encounter- He is not willing to start prolia. -     Phosphorus; Future -     VITAMIN D 25 Hydroxy (Vit-D Deficiency, Fractures); Future -     Basic metabolic panel; Future  Chronic obstructive pulmonary disease, unspecified COPD type (HCC)  Paroxysmal atrial fibrillation with RVR (HCC)- He has good R/R control. -     EKG 12-Lead  BPH with obstruction/lower urinary tract symptoms -     PSA; Future  Manifestations of  thiamine deficiency -     Thiamine HCl; Take 1 tablet (100 mg total) by mouth daily.  Dispense: 90 tablet; Refill: 0  Mucopurulent chronic bronchitis (HCC)- Will restart the LAMA -     Spiriva Respimat; Inhale 2 puffs into the lungs daily.  Dispense: 4 g; Refill:  5  Panlobular emphysema (HCC)- Will continue the LAMA  Stage 3a chronic kidney disease (HCC)- Will avoid nephrotoxic agents.     Follow-up: Return in about 6 months (around 09/18/2023).  Sanda Linger, MD

## 2023-03-24 LAB — LIPOPROTEIN A (LPA): Lipoprotein (a): 30 nmol/L (ref <75)

## 2023-03-25 ENCOUNTER — Other Ambulatory Visit (HOSPITAL_COMMUNITY): Payer: Self-pay

## 2023-03-26 ENCOUNTER — Inpatient Hospital Stay (HOSPITAL_COMMUNITY)
Admission: EM | Admit: 2023-03-26 | Discharge: 2023-04-04 | DRG: 481 | Disposition: A | Discharge status: Skilled Nursing Facility | Payer: Medicare Other | Attending: Internal Medicine | Admitting: Internal Medicine

## 2023-03-26 ENCOUNTER — Ambulatory Visit: Payer: Medicare Other

## 2023-03-26 ENCOUNTER — Other Ambulatory Visit: Payer: Self-pay

## 2023-03-26 ENCOUNTER — Encounter (HOSPITAL_COMMUNITY): Payer: Self-pay

## 2023-03-26 ENCOUNTER — Emergency Department
Admission: EM | Admit: 2023-03-26 | Discharge: 2023-03-26 | Disposition: A | Discharge status: Other Acute Inpatient Hospital | Payer: Medicare Other | Attending: Emergency Medicine | Admitting: Emergency Medicine

## 2023-03-26 ENCOUNTER — Emergency Department (HOSPITAL_COMMUNITY): Payer: Medicare Other

## 2023-03-26 ENCOUNTER — Emergency Department: Payer: Medicare Other

## 2023-03-26 DIAGNOSIS — W1839XA Other fall on same level, initial encounter: Secondary | ICD-10-CM | POA: Diagnosis not present

## 2023-03-26 DIAGNOSIS — M25572 Pain in left ankle and joints of left foot: Secondary | ICD-10-CM | POA: Diagnosis not present

## 2023-03-26 DIAGNOSIS — S72111A Displaced fracture of greater trochanter of right femur, initial encounter for closed fracture: Principal | ICD-10-CM | POA: Diagnosis present

## 2023-03-26 DIAGNOSIS — Z818 Family history of other mental and behavioral disorders: Secondary | ICD-10-CM

## 2023-03-26 DIAGNOSIS — K219 Gastro-esophageal reflux disease without esophagitis: Secondary | ICD-10-CM | POA: Diagnosis present

## 2023-03-26 DIAGNOSIS — S72001A Fracture of unspecified part of neck of right femur, initial encounter for closed fracture: Secondary | ICD-10-CM | POA: Diagnosis not present

## 2023-03-26 DIAGNOSIS — Z4789 Encounter for other orthopedic aftercare: Secondary | ICD-10-CM | POA: Diagnosis not present

## 2023-03-26 DIAGNOSIS — S72009A Fracture of unspecified part of neck of unspecified femur, initial encounter for closed fracture: Secondary | ICD-10-CM | POA: Diagnosis not present

## 2023-03-26 DIAGNOSIS — N138 Other obstructive and reflux uropathy: Secondary | ICD-10-CM | POA: Diagnosis present

## 2023-03-26 DIAGNOSIS — F05 Delirium due to known physiological condition: Secondary | ICD-10-CM | POA: Diagnosis not present

## 2023-03-26 DIAGNOSIS — J449 Chronic obstructive pulmonary disease, unspecified: Secondary | ICD-10-CM | POA: Diagnosis present

## 2023-03-26 DIAGNOSIS — Z811 Family history of alcohol abuse and dependence: Secondary | ICD-10-CM | POA: Diagnosis not present

## 2023-03-26 DIAGNOSIS — M6259 Muscle wasting and atrophy, not elsewhere classified, multiple sites: Secondary | ICD-10-CM | POA: Diagnosis not present

## 2023-03-26 DIAGNOSIS — I480 Paroxysmal atrial fibrillation: Secondary | ICD-10-CM | POA: Diagnosis not present

## 2023-03-26 DIAGNOSIS — J4489 Other specified chronic obstructive pulmonary disease: Secondary | ICD-10-CM | POA: Diagnosis present

## 2023-03-26 DIAGNOSIS — F17210 Nicotine dependence, cigarettes, uncomplicated: Secondary | ICD-10-CM | POA: Diagnosis not present

## 2023-03-26 DIAGNOSIS — Z96641 Presence of right artificial hip joint: Secondary | ICD-10-CM | POA: Insufficient documentation

## 2023-03-26 DIAGNOSIS — S79001A Unspecified physeal fracture of upper end of right femur, initial encounter for closed fracture: Secondary | ICD-10-CM | POA: Diagnosis not present

## 2023-03-26 DIAGNOSIS — S7291XA Unspecified fracture of right femur, initial encounter for closed fracture: Secondary | ICD-10-CM | POA: Diagnosis not present

## 2023-03-26 DIAGNOSIS — N401 Enlarged prostate with lower urinary tract symptoms: Secondary | ICD-10-CM | POA: Diagnosis not present

## 2023-03-26 DIAGNOSIS — M978XXD Periprosthetic fracture around other internal prosthetic joint, subsequent encounter: Secondary | ICD-10-CM | POA: Diagnosis not present

## 2023-03-26 DIAGNOSIS — S72301A Unspecified fracture of shaft of right femur, initial encounter for closed fracture: Secondary | ICD-10-CM | POA: Diagnosis not present

## 2023-03-26 DIAGNOSIS — Z79899 Other long term (current) drug therapy: Secondary | ICD-10-CM | POA: Diagnosis not present

## 2023-03-26 DIAGNOSIS — Z808 Family history of malignant neoplasm of other organs or systems: Secondary | ICD-10-CM | POA: Diagnosis not present

## 2023-03-26 DIAGNOSIS — Z86010 Personal history of colonic polyps: Secondary | ICD-10-CM

## 2023-03-26 DIAGNOSIS — W1830XA Fall on same level, unspecified, initial encounter: Secondary | ICD-10-CM | POA: Diagnosis not present

## 2023-03-26 DIAGNOSIS — G9009 Other idiopathic peripheral autonomic neuropathy: Secondary | ICD-10-CM | POA: Diagnosis not present

## 2023-03-26 DIAGNOSIS — S72009D Fracture of unspecified part of neck of unspecified femur, subsequent encounter for closed fracture with routine healing: Secondary | ICD-10-CM | POA: Diagnosis not present

## 2023-03-26 DIAGNOSIS — Z8249 Family history of ischemic heart disease and other diseases of the circulatory system: Secondary | ICD-10-CM | POA: Diagnosis not present

## 2023-03-26 DIAGNOSIS — S72401A Unspecified fracture of lower end of right femur, initial encounter for closed fracture: Secondary | ICD-10-CM | POA: Diagnosis not present

## 2023-03-26 DIAGNOSIS — S79911A Unspecified injury of right hip, initial encounter: Secondary | ICD-10-CM | POA: Diagnosis not present

## 2023-03-26 DIAGNOSIS — Z7401 Bed confinement status: Secondary | ICD-10-CM | POA: Diagnosis not present

## 2023-03-26 DIAGNOSIS — R41841 Cognitive communication deficit: Secondary | ICD-10-CM | POA: Diagnosis not present

## 2023-03-26 DIAGNOSIS — Z6372 Alcoholism and drug addiction in family: Secondary | ICD-10-CM | POA: Diagnosis not present

## 2023-03-26 DIAGNOSIS — Z823 Family history of stroke: Secondary | ICD-10-CM | POA: Diagnosis not present

## 2023-03-26 DIAGNOSIS — Z833 Family history of diabetes mellitus: Secondary | ICD-10-CM | POA: Diagnosis not present

## 2023-03-26 DIAGNOSIS — Z720 Tobacco use: Secondary | ICD-10-CM | POA: Diagnosis present

## 2023-03-26 DIAGNOSIS — M978XXA Periprosthetic fracture around other internal prosthetic joint, initial encounter: Secondary | ICD-10-CM | POA: Diagnosis present

## 2023-03-26 DIAGNOSIS — Z7982 Long term (current) use of aspirin: Secondary | ICD-10-CM | POA: Insufficient documentation

## 2023-03-26 DIAGNOSIS — N189 Chronic kidney disease, unspecified: Secondary | ICD-10-CM | POA: Diagnosis not present

## 2023-03-26 DIAGNOSIS — R2681 Unsteadiness on feet: Secondary | ICD-10-CM | POA: Diagnosis not present

## 2023-03-26 DIAGNOSIS — E785 Hyperlipidemia, unspecified: Secondary | ICD-10-CM | POA: Diagnosis not present

## 2023-03-26 DIAGNOSIS — Z96649 Presence of unspecified artificial hip joint: Secondary | ICD-10-CM

## 2023-03-26 DIAGNOSIS — F419 Anxiety disorder, unspecified: Secondary | ICD-10-CM | POA: Diagnosis present

## 2023-03-26 DIAGNOSIS — M9701XA Periprosthetic fracture around internal prosthetic right hip joint, initial encounter: Secondary | ICD-10-CM | POA: Diagnosis not present

## 2023-03-26 DIAGNOSIS — Y9289 Other specified places as the place of occurrence of the external cause: Secondary | ICD-10-CM | POA: Diagnosis not present

## 2023-03-26 DIAGNOSIS — M25551 Pain in right hip: Secondary | ICD-10-CM | POA: Diagnosis not present

## 2023-03-26 DIAGNOSIS — E559 Vitamin D deficiency, unspecified: Secondary | ICD-10-CM | POA: Diagnosis not present

## 2023-03-26 DIAGNOSIS — F32A Depression, unspecified: Secondary | ICD-10-CM | POA: Diagnosis present

## 2023-03-26 DIAGNOSIS — R9431 Abnormal electrocardiogram [ECG] [EKG]: Secondary | ICD-10-CM | POA: Diagnosis not present

## 2023-03-26 DIAGNOSIS — I517 Cardiomegaly: Secondary | ICD-10-CM | POA: Diagnosis not present

## 2023-03-26 DIAGNOSIS — N183 Chronic kidney disease, stage 3 unspecified: Secondary | ICD-10-CM | POA: Diagnosis not present

## 2023-03-26 DIAGNOSIS — Z841 Family history of disorders of kidney and ureter: Secondary | ICD-10-CM

## 2023-03-26 DIAGNOSIS — Z471 Aftercare following joint replacement surgery: Secondary | ICD-10-CM | POA: Diagnosis not present

## 2023-03-26 DIAGNOSIS — S72001S Fracture of unspecified part of neck of right femur, sequela: Secondary | ICD-10-CM | POA: Diagnosis not present

## 2023-03-26 DIAGNOSIS — F3289 Other specified depressive episodes: Secondary | ICD-10-CM | POA: Diagnosis not present

## 2023-03-26 DIAGNOSIS — R531 Weakness: Secondary | ICD-10-CM | POA: Diagnosis not present

## 2023-03-26 LAB — CBC
HCT: 38.1 % — ABNORMAL LOW (ref 39.0–52.0)
Hemoglobin: 12.7 g/dL — ABNORMAL LOW (ref 13.0–17.0)
MCH: 31.9 pg (ref 26.0–34.0)
MCHC: 33.3 g/dL (ref 30.0–36.0)
MCV: 95.7 fL (ref 80.0–100.0)
Platelets: 215 10*3/uL (ref 150–400)
RBC: 3.98 MIL/uL — ABNORMAL LOW (ref 4.22–5.81)
RDW: 13.4 % (ref 11.5–15.5)
WBC: 12 10*3/uL — ABNORMAL HIGH (ref 4.0–10.5)
nRBC: 0 % (ref 0.0–0.2)

## 2023-03-26 LAB — BASIC METABOLIC PANEL
Anion gap: 10 (ref 5–15)
BUN: 15 mg/dL (ref 8–23)
CO2: 21 mmol/L — ABNORMAL LOW (ref 22–32)
Calcium: 8.6 mg/dL — ABNORMAL LOW (ref 8.9–10.3)
Chloride: 101 mmol/L (ref 98–111)
Creatinine, Ser: 0.95 mg/dL (ref 0.61–1.24)
GFR, Estimated: >60 mL/min (ref >60)
Glucose, Bld: 108 mg/dL — ABNORMAL HIGH (ref 70–99)
Potassium: 3.4 mmol/L — ABNORMAL LOW (ref 3.5–5.1)
Sodium: 132 mmol/L — ABNORMAL LOW (ref 135–145)

## 2023-03-26 LAB — PROTIME-INR
INR: 1 (ref 0.8–1.2)
Prothrombin Time: 13.8 seconds (ref 11.4–15.2)

## 2023-03-26 MED ORDER — HYDROMORPHONE HCL 1 MG/ML IJ SOLN: 0.5000 mg | Freq: Once | INTRAMUSCULAR | Status: DC

## 2023-03-26 MED ORDER — HYDROCODONE-ACETAMINOPHEN 5-325 MG PO TABS
2.0000 | ORAL_TABLET | Freq: Once | ORAL | Status: AC
  Administered 2023-03-26: 2 via ORAL
  Filled 2023-03-26: qty 2

## 2023-03-26 NOTE — ED Triage Notes (Signed)
Pt was picked up from bus stop due to a fall. Pt reports R hip pain, and has skin tear to R elbow. Pt denies LOC, denies thinners, denies head strike.

## 2023-03-26 NOTE — ED Provider Notes (Signed)
Labs interpreted as mild leukocytosis, very mild anemia.  Platelet count appropriate  Mild hyponatremia and very mild hypokalemia.  INR normal   Sharyn Creamer, MD 03/26/23 2006
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
## 2023-03-26 NOTE — ED Triage Notes (Signed)
Pt arrives via [facility] from [facility], pt had a fall approx. 1700 today, and has fracture of right femur around hardware pt had placed in October for hip replacement that was reported by [facility] crew. GCS 15 at this time.

## 2023-03-26 NOTE — ED Notes (Signed)
[place] reviewed by this RN 
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
## 2023-03-26 NOTE — ED Notes (Signed)
Report given to Engineer, drilling at [facility] ED
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
## 2023-03-26 NOTE — ED Provider Notes (Signed)
Dr. Audelia Acton at bedside, recommends obtain CT hip   Sharyn Creamer, MD 03/26/23 1820

## 2023-03-27 DIAGNOSIS — Z79899 Other long term (current) drug therapy: Secondary | ICD-10-CM | POA: Diagnosis not present

## 2023-03-27 DIAGNOSIS — F17210 Nicotine dependence, cigarettes, uncomplicated: Secondary | ICD-10-CM | POA: Diagnosis present

## 2023-03-27 DIAGNOSIS — S72301A Unspecified fracture of shaft of right femur, initial encounter for closed fracture: Secondary | ICD-10-CM | POA: Diagnosis present

## 2023-03-27 DIAGNOSIS — J449 Chronic obstructive pulmonary disease, unspecified: Secondary | ICD-10-CM

## 2023-03-27 DIAGNOSIS — Z7401 Bed confinement status: Secondary | ICD-10-CM | POA: Diagnosis not present

## 2023-03-27 DIAGNOSIS — S72111A Displaced fracture of greater trochanter of right femur, initial encounter for closed fracture: Secondary | ICD-10-CM | POA: Diagnosis present

## 2023-03-27 DIAGNOSIS — Z833 Family history of diabetes mellitus: Secondary | ICD-10-CM | POA: Diagnosis not present

## 2023-03-27 DIAGNOSIS — E785 Hyperlipidemia, unspecified: Secondary | ICD-10-CM

## 2023-03-27 DIAGNOSIS — K219 Gastro-esophageal reflux disease without esophagitis: Secondary | ICD-10-CM

## 2023-03-27 DIAGNOSIS — S72009A Fracture of unspecified part of neck of unspecified femur, initial encounter for closed fracture: Secondary | ICD-10-CM | POA: Diagnosis present

## 2023-03-27 DIAGNOSIS — S72001A Fracture of unspecified part of neck of right femur, initial encounter for closed fracture: Secondary | ICD-10-CM | POA: Diagnosis not present

## 2023-03-27 DIAGNOSIS — N183 Chronic kidney disease, stage 3 unspecified: Secondary | ICD-10-CM | POA: Diagnosis not present

## 2023-03-27 DIAGNOSIS — Z823 Family history of stroke: Secondary | ICD-10-CM | POA: Diagnosis not present

## 2023-03-27 DIAGNOSIS — Z818 Family history of other mental and behavioral disorders: Secondary | ICD-10-CM | POA: Diagnosis not present

## 2023-03-27 DIAGNOSIS — F05 Delirium due to known physiological condition: Secondary | ICD-10-CM | POA: Diagnosis not present

## 2023-03-27 DIAGNOSIS — N189 Chronic kidney disease, unspecified: Secondary | ICD-10-CM | POA: Diagnosis present

## 2023-03-27 DIAGNOSIS — M978XXA Periprosthetic fracture around other internal prosthetic joint, initial encounter: Secondary | ICD-10-CM | POA: Diagnosis present

## 2023-03-27 DIAGNOSIS — F32A Depression, unspecified: Secondary | ICD-10-CM | POA: Diagnosis present

## 2023-03-27 DIAGNOSIS — Y9289 Other specified places as the place of occurrence of the external cause: Secondary | ICD-10-CM | POA: Diagnosis not present

## 2023-03-27 DIAGNOSIS — R531 Weakness: Secondary | ICD-10-CM | POA: Diagnosis not present

## 2023-03-27 DIAGNOSIS — N401 Enlarged prostate with lower urinary tract symptoms: Secondary | ICD-10-CM

## 2023-03-27 DIAGNOSIS — J4489 Other specified chronic obstructive pulmonary disease: Secondary | ICD-10-CM | POA: Diagnosis present

## 2023-03-27 DIAGNOSIS — M9701XA Periprosthetic fracture around internal prosthetic right hip joint, initial encounter: Secondary | ICD-10-CM | POA: Diagnosis present

## 2023-03-27 DIAGNOSIS — Z6372 Alcoholism and drug addiction in family: Secondary | ICD-10-CM | POA: Diagnosis not present

## 2023-03-27 DIAGNOSIS — N138 Other obstructive and reflux uropathy: Secondary | ICD-10-CM | POA: Diagnosis present

## 2023-03-27 DIAGNOSIS — Z8249 Family history of ischemic heart disease and other diseases of the circulatory system: Secondary | ICD-10-CM | POA: Diagnosis not present

## 2023-03-27 DIAGNOSIS — Z841 Family history of disorders of kidney and ureter: Secondary | ICD-10-CM | POA: Diagnosis not present

## 2023-03-27 DIAGNOSIS — Z720 Tobacco use: Secondary | ICD-10-CM

## 2023-03-27 DIAGNOSIS — Z96649 Presence of unspecified artificial hip joint: Secondary | ICD-10-CM | POA: Diagnosis not present

## 2023-03-27 DIAGNOSIS — Z7982 Long term (current) use of aspirin: Secondary | ICD-10-CM | POA: Diagnosis not present

## 2023-03-27 DIAGNOSIS — Z808 Family history of malignant neoplasm of other organs or systems: Secondary | ICD-10-CM | POA: Diagnosis not present

## 2023-03-27 DIAGNOSIS — Z811 Family history of alcohol abuse and dependence: Secondary | ICD-10-CM | POA: Diagnosis not present

## 2023-03-27 DIAGNOSIS — I480 Paroxysmal atrial fibrillation: Secondary | ICD-10-CM | POA: Diagnosis present

## 2023-03-27 DIAGNOSIS — W1830XA Fall on same level, unspecified, initial encounter: Secondary | ICD-10-CM | POA: Diagnosis present

## 2023-03-27 LAB — URINALYSIS, COMPLETE (UACMP) WITH MICROSCOPIC
Bacteria, UA: NONE SEEN
Bilirubin Urine: NEGATIVE
Glucose, UA: NEGATIVE mg/dL
Hgb urine dipstick: NEGATIVE
Ketones, ur: NEGATIVE mg/dL
Leukocytes,Ua: NEGATIVE
Nitrite: NEGATIVE
Protein, ur: NEGATIVE mg/dL
Specific Gravity, Urine: 1.002 — ABNORMAL LOW (ref 1.005–1.030)
pH: 6 (ref 5.0–8.0)

## 2023-03-27 LAB — COMPREHENSIVE METABOLIC PANEL
ALT: 13 U/L (ref 0–44)
ALT: 14 U/L (ref 0–44)
AST: 18 U/L (ref 15–41)
AST: 18 U/L (ref 15–41)
Albumin: 3.2 g/dL — ABNORMAL LOW (ref 3.5–5.0)
Albumin: 3.5 g/dL (ref 3.5–5.0)
Alkaline Phosphatase: 50 U/L (ref 38–126)
Alkaline Phosphatase: 54 U/L (ref 38–126)
Anion gap: 7 (ref 5–15)
Anion gap: 8 (ref 5–15)
BUN: 15 mg/dL (ref 8–23)
BUN: 16 mg/dL (ref 8–23)
CO2: 26 mmol/L (ref 22–32)
CO2: 26 mmol/L (ref 22–32)
Calcium: 8.5 mg/dL — ABNORMAL LOW (ref 8.9–10.3)
Calcium: 8.7 mg/dL — ABNORMAL LOW (ref 8.9–10.3)
Chloride: 104 mmol/L (ref 98–111)
Chloride: 104 mmol/L (ref 98–111)
Creatinine, Ser: 0.95 mg/dL (ref 0.61–1.24)
Creatinine, Ser: 1.04 mg/dL (ref 0.61–1.24)
GFR, Estimated: >60 mL/min (ref >60)
GFR, Estimated: >60 mL/min (ref >60)
Glucose, Bld: 121 mg/dL — ABNORMAL HIGH (ref 70–99)
Glucose, Bld: 121 mg/dL — ABNORMAL HIGH (ref 70–99)
Potassium: 4 mmol/L (ref 3.5–5.1)
Potassium: 4.4 mmol/L (ref 3.5–5.1)
Sodium: 137 mmol/L (ref 135–145)
Sodium: 138 mmol/L (ref 135–145)
Total Bilirubin: 0.6 mg/dL (ref 0.3–1.2)
Total Bilirubin: 0.9 mg/dL (ref 0.3–1.2)
Total Protein: 6 g/dL — ABNORMAL LOW (ref 6.5–8.1)
Total Protein: 6.5 g/dL (ref 6.5–8.1)

## 2023-03-27 LAB — PROTIME-INR
INR: 1 (ref 0.8–1.2)
Prothrombin Time: 13 seconds (ref 11.4–15.2)

## 2023-03-27 LAB — CBC
HCT: 42.1 % (ref 39.0–52.0)
Hemoglobin: 13.8 g/dL (ref 13.0–17.0)
MCH: 31.8 pg (ref 26.0–34.0)
MCHC: 32.8 g/dL (ref 30.0–36.0)
MCV: 97 fL (ref 80.0–100.0)
Platelets: 232 10*3/uL (ref 150–400)
RBC: 4.34 MIL/uL (ref 4.22–5.81)
RDW: 13.8 % (ref 11.5–15.5)
WBC: 9.2 10*3/uL (ref 4.0–10.5)
nRBC: 0 % (ref 0.0–0.2)

## 2023-03-27 LAB — TYPE AND SCREEN
ABO/RH(D): O POS
Antibody Screen: NEGATIVE

## 2023-03-27 LAB — TSH: TSH: 1.019 u[IU]/mL (ref 0.350–4.500)

## 2023-03-27 LAB — TROPONIN I (HIGH SENSITIVITY)
Troponin I (High Sensitivity): 4 ng/L (ref <18)
Troponin I (High Sensitivity): 5 ng/L (ref <18)

## 2023-03-27 LAB — ETHANOL: Alcohol, Ethyl (B): <10 mg/dL (ref <10)

## 2023-03-27 LAB — CK: Total CK: 244 U/L (ref 49–397)

## 2023-03-27 LAB — HIV ANTIBODY (ROUTINE TESTING W REFLEX): HIV Screen 4th Generation wRfx: NONREACTIVE

## 2023-03-27 LAB — VITAMIN D 25 HYDROXY (VIT D DEFICIENCY, FRACTURES): Vit D, 25-Hydroxy: 42.18 ng/mL (ref 30–100)

## 2023-03-27 LAB — PHOSPHORUS: Phosphorus: 4.2 mg/dL (ref 2.5–4.6)

## 2023-03-27 LAB — MAGNESIUM: Magnesium: 2 mg/dL (ref 1.7–2.4)

## 2023-03-27 MED ORDER — HYDROMORPHONE HCL 1 MG/ML IJ SOLN
1.0000 mg | INTRAMUSCULAR | Status: DC | PRN
  Administered 2023-03-27 – 2023-03-30 (×8): 1 mg via INTRAVENOUS
  Filled 2023-03-27 (×10): qty 1

## 2023-03-27 MED ORDER — ENSURE MAX PROTEIN PO LIQD
11.0000 [oz_av] | Freq: Every day | ORAL | Status: DC
  Administered 2023-03-28 – 2023-04-04 (×7): 11 [oz_av] via ORAL
  Filled 2023-03-27: qty 330

## 2023-03-27 MED ORDER — MORPHINE SULFATE (PF) 2 MG/ML IV SOLN: 0.5000 mg | INTRAVENOUS | Status: DC | PRN

## 2023-03-27 MED ORDER — NICOTINE 14 MG/24HR TD PT24
14.0000 mg | MEDICATED_PATCH | Freq: Every day | TRANSDERMAL | Status: DC
  Administered 2023-03-27 – 2023-04-04 (×8): 14 mg via TRANSDERMAL
  Filled 2023-03-27 (×8): qty 1

## 2023-03-27 MED ORDER — METHOCARBAMOL 500 MG PO TABS
500.0000 mg | ORAL_TABLET | Freq: Four times a day (QID) | ORAL | Status: DC | PRN
  Administered 2023-03-30: 500 mg via ORAL
  Filled 2023-03-27 (×2): qty 1

## 2023-03-27 MED ORDER — OXYCODONE HCL 5 MG PO TABS
10.0000 mg | ORAL_TABLET | Freq: Four times a day (QID) | ORAL | Status: DC | PRN
  Administered 2023-03-27 – 2023-04-01 (×12): 10 mg via ORAL
  Filled 2023-03-27 (×12): qty 2

## 2023-03-27 MED ORDER — SERTRALINE HCL 100 MG PO TABS
100.0000 mg | ORAL_TABLET | Freq: Every day | ORAL | Status: DC
  Administered 2023-03-27 – 2023-04-04 (×9): 100 mg via ORAL
  Filled 2023-03-27 (×9): qty 1

## 2023-03-27 MED ORDER — METHOCARBAMOL 1000 MG/10ML IJ SOLN: 500.0000 mg | Freq: Four times a day (QID) | INTRAVENOUS | Status: DC | PRN

## 2023-03-27 MED ORDER — GABAPENTIN 300 MG PO CAPS
600.0000 mg | ORAL_CAPSULE | Freq: Three times a day (TID) | ORAL | Status: DC
  Administered 2023-03-27 – 2023-04-04 (×25): 600 mg via ORAL
  Filled 2023-03-27 (×25): qty 2

## 2023-03-27 MED ORDER — OXYCODONE-ACETAMINOPHEN 5-325 MG PO TABS
1.0000 | ORAL_TABLET | Freq: Once | ORAL | Status: AC
  Administered 2023-03-27: 1 via ORAL
  Filled 2023-03-27: qty 1

## 2023-03-27 MED ORDER — ADULT MULTIVITAMIN W/MINERALS CH
1.0000 | ORAL_TABLET | Freq: Every day | ORAL | Status: DC
  Administered 2023-03-27 – 2023-04-04 (×8): 1 via ORAL
  Filled 2023-03-27 (×7): qty 1

## 2023-03-27 MED ORDER — ENOXAPARIN SODIUM 40 MG/0.4ML IJ SOSY
40.0000 mg | PREFILLED_SYRINGE | INTRAMUSCULAR | Status: AC
  Administered 2023-03-27 – 2023-03-30 (×4): 40 mg via SUBCUTANEOUS
  Filled 2023-03-27 (×4): qty 0.4

## 2023-03-27 MED ORDER — UMECLIDINIUM BROMIDE 62.5 MCG/ACT IN AEPB
1.0000 | INHALATION_SPRAY | Freq: Every day | RESPIRATORY_TRACT | Status: DC
  Administered 2023-03-27 – 2023-04-04 (×9): 1 via RESPIRATORY_TRACT
  Filled 2023-03-27 (×2): qty 7

## 2023-03-27 MED ORDER — TIOTROPIUM BROMIDE MONOHYDRATE 1.25 MCG/ACT IN AERS: 2.0000 | INHALATION_SPRAY | Freq: Every day | RESPIRATORY_TRACT | Status: DC

## 2023-03-27 MED ORDER — PANTOPRAZOLE SODIUM 40 MG PO TBEC
40.0000 mg | DELAYED_RELEASE_TABLET | Freq: Every day | ORAL | Status: DC
  Administered 2023-03-27 – 2023-04-04 (×9): 40 mg via ORAL
  Filled 2023-03-27 (×9): qty 1

## 2023-03-27 MED ORDER — HYDROCODONE-ACETAMINOPHEN 5-325 MG PO TABS: 1.0000 | ORAL_TABLET | Freq: Four times a day (QID) | ORAL | Status: DC | PRN

## 2023-03-27 MED ORDER — ROSUVASTATIN CALCIUM 20 MG PO TABS
20.0000 mg | ORAL_TABLET | Freq: Every day | ORAL | Status: DC
  Administered 2023-03-27 – 2023-04-04 (×9): 20 mg via ORAL
  Filled 2023-03-27 (×9): qty 1

## 2023-03-27 NOTE — Assessment & Plan Note (Addendum)
-   management as per orthopedics,  plan to operate   in  a.m.  Keep nothing by mouth post midnight. Patient  not on anticoagulation or antiplatelet agents   on hold Ordered type and screen  order a vitamin D level  Patient at baseline  able to walk a flight of stairs or 100 feet    Patient denies any chest pain or shortness of breath currently and/or with exertion,   ECG showing no evidence of acute ischemia  no known history of coronary artery disease,   Liver failure or CKD  Given advanced age patient is at least moderate  risk  which has been discussed withpatient but at this point no furthther cardiac workup is indicated. COPD is mild continue to provide support and as needed medications

## 2023-03-27 NOTE — Assessment & Plan Note (Signed)
Chronic stable continue Crestor 20 mg a day

## 2023-03-27 NOTE — Assessment & Plan Note (Signed)
Continue Protonix 40 mg daily

## 2023-03-27 NOTE — Progress Notes (Signed)
Initial Nutrition Assessment  DOCUMENTATION CODES:   Not applicable  INTERVENTION:  - Regular diet.  - Ensure Max po once daily, provides 150 kcal and 30 grams of protein.  - Encourage intake at all meals of protein rich food sources.  - Multivitamin with minerals daily - Monitor weight trends.   NUTRITION DIAGNOSIS:   Increased nutrient needs related to acute illness (right hip fracture) as evidenced by estimated needs.  GOAL:   Patient will meet greater than or equal to 90% of their needs  MONITOR:   PO intake, Supplement acceptance, Weight trends  REASON FOR ASSESSMENT:   Consult Hip fracture protocol  ASSESSMENT:   67 y.o. male with PMH significant of COPD, right hip replacement in October 2023 who presented after mechanical fall. Admitted for right hip fracture.  Patient reports UBW of 156# and denies and recent changes in weight. Per EMR weight stable the past 6 months.   He endorses eating 2 meals a day at home, usually lunch and dinner. Appetite good at baseline. Does not consume nutrition supplements at home.   Current appetite normal and patient reports he is waiting for breakfast (eggs and bacon) to arrive. Agreeable to try Ensure during admission to support healing.   Patient needs ORIF but surgeon out of town until Monday so scheduled for surgery at that time.  Medications reviewed and include: -  Labs reviewed:  -   NUTRITION - FOCUSED PHYSICAL EXAM:  Flowsheet Row Most Recent Value  Orbital Region No depletion  Upper Arm Region No depletion  Thoracic and Lumbar Region No depletion  Buccal Region No depletion  Temple Region No depletion  Clavicle Bone Region No depletion  Clavicle and Acromion Bone Region No depletion  Scapular Bone Region Unable to assess  Dorsal Hand No depletion  Patellar Region No depletion  Anterior Thigh Region No depletion  Posterior Calf Region No depletion  Edema (RD Assessment) None  Hair Reviewed  Eyes  Reviewed  Mouth Reviewed  Skin Reviewed  Nails Reviewed       Diet Order:   Diet Order             Diet regular Room service appropriate? Yes; Fluid consistency: Thin  Diet effective now                   EDUCATION NEEDS:  Education needs have been addressed  Skin:  Skin Assessment: Reviewed RN Assessment  Last BM:  7/3  Height:  Ht Readings from Last 1 Encounters:  03/26/23 5\' 7"  (1.702 m)   Weight:  Wt Readings from Last 1 Encounters:  03/26/23 70 kg    BMI:  Body mass index is 24.17 kg/m.  Estimated Nutritional Needs:  Kcal:  1950-2100 kcals Protein:  75-90 grams Fluid:  >/= 1.9L    Shelle Iron RD, LDN For contact information, refer to [facility].
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
## 2023-03-27 NOTE — Assessment & Plan Note (Addendum)
Patient no longer takes Flomax need to make sure able to urinate postoperative and resume if needed

## 2023-03-27 NOTE — Assessment & Plan Note (Signed)
-   Spoke about importance of quitting spent 5 minutes discussing options for treatment, prior attempts at quitting, and dangers of smoking  -At this point patient is     interested in quitting  - order nicotine patch   - nursing tobacco cessation protocol  
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
## 2023-03-27 NOTE — Progress Notes (Signed)
Subjective: Patient admitted this morning, see detailed H&P by Dr Adela Glimpse 67 y.o. male with medical history significant of COPD, right hip replacement in October 2023  Presented with   mechanical fall. Pt was waiting on the bus and stumble falling gon the right side hitting right elbow and right hip but denies head injury  Hx of A.fib but only on aspirin CT right hip showed right hip arthroplasty, associated periprosthetic fracture involving greater trochanter and anterior proximal femoral shaft.  Orthopedics was consulted  Vitals:   03/27/23 0242 03/27/23 0859  BP: 114/75 (!) 136/97  Pulse: 64 65  Resp: 19 18  Temp: 98.3 F (36.8 C) 98.5 F (36.9 C)  SpO2: 96% 98%      A/P  Periprosthetic fracture -Orthopedics consulted, plan for surgery on Monday -Continue Lovenox for DVT prophylaxis, Percocet, Robaxin as needed -Continue nonweightbearing right lower extremity  Preop evaluation -Patient baseline able to walk a flight of stairs 100 feet -No cardiac history -Patient is moderate cardiac risk for surgery; no further workup indicated  History of atrial fibrillation -EKG showed normal sinus rhythm -Patient had brief episode of A-fib last year in October -Was not started on anticoagulation   GERD (gastroesophageal reflux disease) Continue Protonix 40 mg daily   Hyperlipidemia with target LDL less than 130 Chronic stable continue Crestor 20 mg a day   BPH with obstruction/lower urinary tract symptoms Continue Flomax   COPD (chronic obstructive pulmonary disease) (HCC) Chronic mild not on oxygen support as needed       [name] [facility]

## 2023-03-27 NOTE — Assessment & Plan Note (Signed)
Chronic mild not on oxygen support as needed

## 2023-03-27 NOTE — Progress Notes (Signed)
Attempted to call report. Nurse unavailable at this time. Nurse to return call.

## 2023-03-28 DIAGNOSIS — J449 Chronic obstructive pulmonary disease, unspecified: Secondary | ICD-10-CM | POA: Diagnosis not present

## 2023-03-28 DIAGNOSIS — S72001A Fracture of unspecified part of neck of right femur, initial encounter for closed fracture: Secondary | ICD-10-CM | POA: Diagnosis not present

## 2023-03-28 DIAGNOSIS — N401 Enlarged prostate with lower urinary tract symptoms: Secondary | ICD-10-CM | POA: Diagnosis not present

## 2023-03-28 DIAGNOSIS — K219 Gastro-esophageal reflux disease without esophagitis: Secondary | ICD-10-CM | POA: Diagnosis not present

## 2023-03-28 NOTE — Progress Notes (Signed)
[facility]  PROGRESS NOTE  Taylor Ochoa [id number] DOB: Sep 03, 1956 DOA: 03/26/2023 PCP: Etta Grandchild, MD   Brief HPI:   67 y.o. male with medical history significant of COPD, right hip replacement in October 2023  Presented with   mechanical fall. Pt was waiting on the bus and stumble falling gon the right side hitting right elbow and right hip but denies head injury  Hx of A.fib but only on aspirin CT right hip showed right hip arthroplasty, associated periprosthetic fracture involving greater trochanter and anterior proximal femoral shaft.  Orthopedics was consulted.    Assessment/Plan:   Periprosthetic fracture -Orthopedics consulted, plan for surgery on Monday -Continue Lovenox for DVT prophylaxis, Percocet, Robaxin as needed -Continue nonweightbearing right lower extremity   Preop evaluation -Patient baseline able to walk a flight of stairs 100 feet -No cardiac history -Patient is moderate cardiac risk for surgery; no further workup indicated   History of atrial fibrillation -EKG showed normal sinus rhythm -Patient had brief episode of A-fib last year in October -Was not started on anticoagulation     GERD (gastroesophageal reflux disease) Continue Protonix 40 mg daily   Hyperlipidemia with target LDL less than 130 Chronic stable continue Crestor 20 mg a day   BPH with obstruction/lower urinary tract symptoms Continue Flomax   COPD (chronic obstructive pulmonary disease) (HCC) Chronic mild not on oxygen support as needed    Medications     enoxaparin (LOVENOX) injection  40 mg Subcutaneous Q24H   gabapentin  600 mg Oral TID   multivitamin with minerals  1 tablet Oral Daily   nicotine  14 mg Transdermal Daily   pantoprazole  40 mg Oral Daily   Ensure Max Protein  11 oz Oral Daily   rosuvastatin  20 mg Oral Daily   sertraline  100 mg Oral Daily   umeclidinium bromide  1 puff Inhalation Daily     Data Reviewed:   CBG:  No results for  input(s): "GLUCAP" in the last 168 hours.  SpO2: 94 %    Vitals:   03/27/23 2053 03/28/23 0110 03/28/23 0506 03/28/23 0819  BP: 127/84 (!) 147/79 116/67   Pulse: 80 72 71   Resp: 16 18 17    Temp: 98 F (36.7 C) 97.8 F (36.6 C) 97.7 F (36.5 C)   TempSrc: Oral Oral Oral   SpO2: 94% 95% 93% 94%  Weight:      Height:          Data Reviewed:  Basic Metabolic Panel: Recent Labs  Lab 03/26/23 1937 03/27/23 0013 03/27/23 1010  NA 132* 137 138  K 3.4* 4.0 4.4  CL 101 104 104  CO2 21* 26 26  GLUCOSE 108* 121* 121*  BUN 15 15 16   CREATININE 0.95 1.04 0.95  CALCIUM 8.6* 8.5* 8.7*  MG  --  2.0  --   PHOS  --  4.2  --     CBC: Recent Labs  Lab 03/26/23 1937 03/27/23 1010  WBC 12.0* 9.2  HGB 12.7* 13.8  HCT 38.1* 42.1  MCV 95.7 97.0  PLT 215 232    LFT Recent Labs  Lab 03/27/23 0013 03/27/23 1010  AST 18 18  ALT 13 14  ALKPHOS 50 54  BILITOT 0.6 0.9  PROT 6.0* 6.5  ALBUMIN 3.2* 3.5     Antibiotics: Anti-infectives (From admission, onward)    None        DVT prophylaxis: Lovenox  Code Status: Full code  Family Communication:  CONSULTS Orthopedics   Subjective   Denies pain   Objective    Physical Examination:   General-appears in no acute distress Heart-S1-S2, regular, no murmur auscultated Lungs-clear to auscultation bilaterally, no wheezing or crackles auscultated Abdomen-soft, nontender, no organomegaly Extremities-no edema in the lower extremities Neuro-alert, oriented x3, no focal deficit noted  Status is: Inpatient:             [name]   [facility] If 7PM-7AM, please contact night-coverage at [email or web address], Office  [phone]   03/28/2023, 8:46 AM  LOS: 1 day

## 2023-03-28 NOTE — Progress Notes (Signed)
   03/28/23 1528  TOC Brief Assessment  Insurance and Status Reviewed  Patient has primary care physician Yes  Home environment has been reviewed Home  Prior level of function: Independent  Social Determinants of Health Reivew SDOH reviewed no interventions necessary  Readmission risk has been reviewed Yes  Transition of care needs no transition of care needs at this time   Assessment completed. No TOC needs noted at this time.

## 2023-03-29 ENCOUNTER — Inpatient Hospital Stay (HOSPITAL_COMMUNITY): Payer: Medicare Other

## 2023-03-29 DIAGNOSIS — N401 Enlarged prostate with lower urinary tract symptoms: Secondary | ICD-10-CM | POA: Diagnosis not present

## 2023-03-29 DIAGNOSIS — J449 Chronic obstructive pulmonary disease, unspecified: Secondary | ICD-10-CM | POA: Diagnosis not present

## 2023-03-29 DIAGNOSIS — K219 Gastro-esophageal reflux disease without esophagitis: Secondary | ICD-10-CM | POA: Diagnosis not present

## 2023-03-29 DIAGNOSIS — S72001A Fracture of unspecified part of neck of right femur, initial encounter for closed fracture: Secondary | ICD-10-CM | POA: Diagnosis not present

## 2023-03-29 LAB — URINE DRUGS OF ABUSE SCREEN W ALC, ROUTINE (REF LAB)
Amphetamines, Urine: NEGATIVE ng/mL
Barbiturate, Ur: NEGATIVE ng/mL
Benzodiazepine Quant, Ur: NEGATIVE ng/mL
Cannabinoid Quant, Ur: NEGATIVE ng/mL
Cocaine (Metab.): NEGATIVE ng/mL
Ethanol U, Quan: NEGATIVE %
Methadone Screen, Urine: NEGATIVE ng/mL
Opiate Quant, Ur: NEGATIVE ng/mL
Phencyclidine, Ur: NEGATIVE ng/mL
Propoxyphene, Urine: NEGATIVE ng/mL

## 2023-03-29 MED ORDER — BENZONATATE 100 MG PO CAPS
100.0000 mg | ORAL_CAPSULE | Freq: Three times a day (TID) | ORAL | Status: DC | PRN
  Administered 2023-03-29: 100 mg via ORAL
  Filled 2023-03-29 (×2): qty 1

## 2023-03-29 NOTE — Progress Notes (Signed)
[facility]  PROGRESS NOTE  Taylor Ochoa:[id number] DOB: 19-Sep-1956 DOA: 03/26/2023 PCP: Etta Grandchild, MD   Brief HPI:   67 y.o. male with medical history significant of COPD, right hip replacement in October 2023  Presented with   mechanical fall. Pt was waiting on the bus and stumble falling gon the right side hitting right elbow and right hip but denies head injury  Hx of A.fib but only on aspirin CT right hip showed right hip arthroplasty, associated periprosthetic fracture involving greater trochanter and anterior proximal femoral shaft.  Orthopedics was consulted.    Assessment/Plan:   Periprosthetic fracture -Orthopedics consulted, plan for surgery on Monday -Continue Lovenox for DVT prophylaxis, Percocet, Robaxin as needed -Continue nonweightbearing right lower extremity   Preop evaluation -Patient baseline able to walk a flight of stairs 100 feet -No cardiac history -Patient is moderate cardiac risk for surgery; no further workup indicated   History of atrial fibrillation -EKG showed normal sinus rhythm -Patient had brief episode of A-fib last year in October -Was not started on anticoagulation     GERD (gastroesophageal reflux disease) Continue Protonix 40 mg daily   Hyperlipidemia with target LDL less than 130 Chronic stable continue Crestor 20 mg a day   BPH with obstruction/lower urinary tract symptoms Continue Flomax   COPD (chronic obstructive pulmonary disease) (HCC) Chronic mild not on oxygen support as needed    Medications     enoxaparin (LOVENOX) injection  40 mg Subcutaneous Q24H   gabapentin  600 mg Oral TID   multivitamin with minerals  1 tablet Oral Daily   nicotine  14 mg Transdermal Daily   pantoprazole  40 mg Oral Daily   Ensure Max Protein  11 oz Oral Daily   rosuvastatin  20 mg Oral Daily   sertraline  100 mg Oral Daily   umeclidinium bromide  1 puff Inhalation Daily     Data Reviewed:   CBG:  No results for  input(s): "GLUCAP" in the last 168 hours.  SpO2: 94 %    Vitals:   03/28/23 0506 03/28/23 0819 03/28/23 2008 03/29/23 0632  BP: 116/67 129/83 129/78 103/64  Pulse: 71 88 87 73  Resp: 17 18 18 18   Temp: 97.7 F (36.5 C) 98.3 F (36.8 C) 98.8 F (37.1 C) (!) 97.4 F (36.3 C)  TempSrc: Oral Oral Oral Oral  SpO2: 93% 94% 92% 94%  Weight:      Height:          Data Reviewed:  Basic Metabolic Panel: Recent Labs  Lab 03/26/23 1937 03/27/23 0013 03/27/23 1010  NA 132* 137 138  K 3.4* 4.0 4.4  CL 101 104 104  CO2 21* 26 26  GLUCOSE 108* 121* 121*  BUN 15 15 16   CREATININE 0.95 1.04 0.95  CALCIUM 8.6* 8.5* 8.7*  MG  --  2.0  --   PHOS  --  4.2  --     CBC: Recent Labs  Lab 03/26/23 1937 03/27/23 1010  WBC 12.0* 9.2  HGB 12.7* 13.8  HCT 38.1* 42.1  MCV 95.7 97.0  PLT 215 232    LFT Recent Labs  Lab 03/27/23 0013 03/27/23 1010  AST 18 18  ALT 13 14  ALKPHOS 50 54  BILITOT 0.6 0.9  PROT 6.0* 6.5  ALBUMIN 3.2* 3.5     Antibiotics: Anti-infectives (From admission, onward)    None        DVT prophylaxis: Lovenox  Code Status: Full  code  Family Communication:    CONSULTS Orthopedics   Subjective   Patient seen and examined, denies complaints.  No pain   Objective    Physical Examination:  Appears in no acute distress S1-S2, regular Lungs clear to auscultation bilaterally Abdomen is soft, nontender, no organomegaly   Status is: Inpatient:             Taylor Ochoa   [facility] If 7PM-7AM, please contact night-coverage at [email or web address], Office  [phone]   03/29/2023, 8:41 AM  LOS: 2 days

## 2023-03-30 DIAGNOSIS — N401 Enlarged prostate with lower urinary tract symptoms: Secondary | ICD-10-CM | POA: Diagnosis not present

## 2023-03-30 DIAGNOSIS — K219 Gastro-esophageal reflux disease without esophagitis: Secondary | ICD-10-CM | POA: Diagnosis not present

## 2023-03-30 DIAGNOSIS — J449 Chronic obstructive pulmonary disease, unspecified: Secondary | ICD-10-CM | POA: Diagnosis not present

## 2023-03-30 DIAGNOSIS — S72001A Fracture of unspecified part of neck of right femur, initial encounter for closed fracture: Secondary | ICD-10-CM | POA: Diagnosis not present

## 2023-03-30 MED ORDER — CHLORHEXIDINE GLUCONATE 4 % EX SOLN
60.0000 mL | Freq: Once | CUTANEOUS | Status: AC
  Administered 2023-03-31: 4 via TOPICAL
  Filled 2023-03-30: qty 60

## 2023-03-30 MED ORDER — ENSURE PRE-SURGERY PO LIQD
296.0000 mL | Freq: Once | ORAL | Status: AC
  Administered 2023-03-31: 296 mL via ORAL
  Filled 2023-03-30: qty 296

## 2023-03-30 MED ORDER — TRANEXAMIC ACID-NACL 1000-0.7 MG/100ML-% IV SOLN
1000.0000 mg | INTRAVENOUS | Status: DC
  Filled 2023-03-30: qty 100

## 2023-03-30 MED ORDER — SODIUM CHLORIDE 0.9 % IV SOLN: INTRAVENOUS | Status: DC

## 2023-03-30 MED ORDER — POVIDONE-IODINE 10 % EX SWAB: 2.0000 | Freq: Once | CUTANEOUS | Status: DC

## 2023-03-30 MED ORDER — CEFAZOLIN SODIUM-DEXTROSE 2-4 GM/100ML-% IV SOLN
2.0000 g | INTRAVENOUS | Status: AC
  Administered 2023-03-31: 2 g via INTRAVENOUS
  Filled 2023-03-30: qty 100

## 2023-03-30 NOTE — Progress Notes (Signed)
[facility]  PROGRESS NOTE  Taylor Ochoa [id number] DOB: Jan 24, 1956 DOA: 03/26/2023 PCP: Etta Grandchild, MD   Brief HPI:   67 y.o. male with medical history significant of COPD, right hip replacement in October 2023  Presented with   mechanical fall. Pt was waiting on the bus and stumble falling gon the right side hitting right elbow and right hip but denies head injury  Hx of A.fib but only on aspirin CT right hip showed right hip arthroplasty, associated periprosthetic fracture involving greater trochanter and anterior proximal femoral shaft.  Orthopedics was consulted.    Assessment/Plan:   Periprosthetic fracture -Orthopedics consulted, plan for surgery on Monday -Continue Lovenox for DVT prophylaxis, Percocet, Robaxin as needed -Continue nonweightbearing right lower extremity -Will keep n.p.o. after midnight   Preop evaluation -Patient baseline able to walk a flight of stairs 100 feet -No cardiac history -Patient is moderate cardiac risk for surgery; no further workup indicated   History of atrial fibrillation -EKG showed normal sinus rhythm -Patient had brief episode of A-fib last year in October -Was not started on anticoagulation     GERD (gastroesophageal reflux disease) Continue Protonix 40 mg daily   Hyperlipidemia with target LDL less than 130 Chronic stable continue Crestor 20 mg a day   BPH with obstruction/lower urinary tract symptoms Continue Flomax   COPD (chronic obstructive pulmonary disease) (HCC) Chronic mild not on oxygen support as needed    Medications     [START ON 03/31/2023] chlorhexidine  60 mL Topical Once   enoxaparin (LOVENOX) injection  40 mg Subcutaneous Q24H   [START ON 03/31/2023] feeding supplement  296 mL Oral Once   gabapentin  600 mg Oral TID   multivitamin with minerals  1 tablet Oral Daily   nicotine  14 mg Transdermal Daily   pantoprazole  40 mg Oral Daily   [START ON 03/31/2023] povidone-iodine  2 Application  Topical Once   Ensure Max Protein  11 oz Oral Daily   rosuvastatin  20 mg Oral Daily   sertraline  100 mg Oral Daily   umeclidinium bromide  1 puff Inhalation Daily     Data Reviewed:   CBG:  No results for input(s): "GLUCAP" in the last 168 hours.  SpO2: 95 %    Vitals:   03/29/23 0855 03/29/23 1158 03/29/23 2222 03/30/23 0442  BP:  104/88 135/74 (!) 126/90  Pulse:  67 78 75  Resp:  16 16 16   Temp:  98.7 F (37.1 C) 98.6 F (37 C) 98.7 F (37.1 C)  TempSrc:  Oral Oral Oral  SpO2: 92% 97% 97% 95%  Weight:      Height:          Data Reviewed:  Basic Metabolic Panel: Recent Labs  Lab 03/26/23 1937 03/27/23 0013 03/27/23 1010  NA 132* 137 138  K 3.4* 4.0 4.4  CL 101 104 104  CO2 21* 26 26  GLUCOSE 108* 121* 121*  BUN 15 15 16   CREATININE 0.95 1.04 0.95  CALCIUM 8.6* 8.5* 8.7*  MG  --  2.0  --   PHOS  --  4.2  --     CBC: Recent Labs  Lab 03/26/23 1937 03/27/23 1010  WBC 12.0* 9.2  HGB 12.7* 13.8  HCT 38.1* 42.1  MCV 95.7 97.0  PLT 215 232    LFT Recent Labs  Lab 03/27/23 0013 03/27/23 1010  AST 18 18  ALT 13 14  ALKPHOS 50 54  BILITOT 0.6  0.9  PROT 6.0* 6.5  ALBUMIN 3.2* 3.5     Antibiotics: Anti-infectives (From admission, onward)    Start     Dose/Rate Route Frequency Ordered Stop   03/31/23 0600  ceFAZolin (ANCEF) IVPB 2g/100 mL premix        2 g 200 mL/hr over 30 Minutes Intravenous On call to O.R. 03/30/23 0009 04/01/23 0559        DVT prophylaxis: Lovenox  Code Status: Full code  Family Communication:    CONSULTS Orthopedics   Subjective    Patient seen and examined, denies pain.  Objective    Physical Examination:  General-appears in no acute distress Heart-S1-S2, regular, no murmur auscultated Lungs-clear to auscultation bilaterally, no wheezing or crackles auscultated Abdomen-soft, nontender, no organomegaly Extremities-no edema in the lower extremities Neuro-alert, oriented x3, no focal deficit  noted   Status is: Inpatient:             [name]   [facility] If 7PM-7AM, please contact night-coverage at [email or web address], Office  [phone]   03/30/2023, 8:40 AM  LOS: 3 days

## 2023-03-30 NOTE — Progress Notes (Signed)
Patient's orders for surgery on Monday verified with Pharmacy. Spoke with Azucena Freed, RPH. Please verify active orders/ orders released to prepare patient for the procedure on  Monday 03/31/2023
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
## 2023-03-31 ENCOUNTER — Inpatient Hospital Stay (HOSPITAL_COMMUNITY): Payer: Medicare Other | Admitting: Anesthesiology

## 2023-03-31 ENCOUNTER — Encounter (HOSPITAL_COMMUNITY)
Admission: EM | Disposition: A | Discharge status: Skilled Nursing Facility | Payer: Self-pay | Source: Home / Self Care | Attending: Family Medicine

## 2023-03-31 ENCOUNTER — Encounter (HOSPITAL_COMMUNITY): Payer: Self-pay | Admitting: Internal Medicine

## 2023-03-31 ENCOUNTER — Inpatient Hospital Stay (HOSPITAL_COMMUNITY): Payer: Medicare Other

## 2023-03-31 ENCOUNTER — Other Ambulatory Visit: Payer: Self-pay

## 2023-03-31 DIAGNOSIS — M9701XA Periprosthetic fracture around internal prosthetic right hip joint, initial encounter: Secondary | ICD-10-CM

## 2023-03-31 DIAGNOSIS — K219 Gastro-esophageal reflux disease without esophagitis: Secondary | ICD-10-CM | POA: Diagnosis not present

## 2023-03-31 DIAGNOSIS — N401 Enlarged prostate with lower urinary tract symptoms: Secondary | ICD-10-CM | POA: Diagnosis not present

## 2023-03-31 DIAGNOSIS — N183 Chronic kidney disease, stage 3 unspecified: Secondary | ICD-10-CM

## 2023-03-31 DIAGNOSIS — S72001A Fracture of unspecified part of neck of right femur, initial encounter for closed fracture: Secondary | ICD-10-CM | POA: Diagnosis not present

## 2023-03-31 DIAGNOSIS — I480 Paroxysmal atrial fibrillation: Secondary | ICD-10-CM

## 2023-03-31 DIAGNOSIS — J449 Chronic obstructive pulmonary disease, unspecified: Secondary | ICD-10-CM | POA: Diagnosis not present

## 2023-03-31 DIAGNOSIS — E785 Hyperlipidemia, unspecified: Secondary | ICD-10-CM

## 2023-03-31 HISTORY — PX: ORIF PERIPROSTHETIC FRACTURE: SHX5034

## 2023-03-31 LAB — TYPE AND SCREEN
ABO/RH(D): O POS
Antibody Screen: NEGATIVE

## 2023-03-31 LAB — SURGICAL PCR SCREEN
MRSA, PCR: NEGATIVE
Staphylococcus aureus: NEGATIVE

## 2023-03-31 SURGERY — OPEN REDUCTION INTERNAL FIXATION (ORIF) PERIPROSTHETIC FRACTURE
Anesthesia: General | Laterality: Right

## 2023-03-31 MED ORDER — ROCURONIUM BROMIDE 10 MG/ML (PF) SYRINGE
PREFILLED_SYRINGE | INTRAVENOUS | Status: DC | PRN
  Administered 2023-03-31: 20 mg via INTRAVENOUS
  Administered 2023-03-31: 60 mg via INTRAVENOUS

## 2023-03-31 MED ORDER — PROPOFOL 10 MG/ML IV BOLUS
INTRAVENOUS | Status: AC
  Filled 2023-03-31: qty 20

## 2023-03-31 MED ORDER — PHENYLEPHRINE HCL-NACL 20-0.9 MG/250ML-% IV SOLN
INTRAVENOUS | Status: DC | PRN
  Administered 2023-03-31: 50 ug/min via INTRAVENOUS

## 2023-03-31 MED ORDER — AMISULPRIDE (ANTIEMETIC) 5 MG/2ML IV SOLN: 10.0000 mg | Freq: Once | INTRAVENOUS | Status: DC | PRN

## 2023-03-31 MED ORDER — DOCUSATE SODIUM 100 MG PO CAPS
100.0000 mg | ORAL_CAPSULE | Freq: Two times a day (BID) | ORAL | Status: DC
  Administered 2023-03-31 – 2023-04-03 (×7): 100 mg via ORAL
  Filled 2023-03-31 (×8): qty 1

## 2023-03-31 MED ORDER — FENTANYL CITRATE (PF) 100 MCG/2ML IJ SOLN
INTRAMUSCULAR | Status: DC | PRN
  Administered 2023-03-31: 75 ug via INTRAVENOUS
  Administered 2023-03-31: 100 ug via INTRAVENOUS

## 2023-03-31 MED ORDER — HYDROCODONE-ACETAMINOPHEN 7.5-325 MG PO TABS
1.0000 | ORAL_TABLET | ORAL | Status: DC | PRN
  Administered 2023-04-01 (×2): 1 via ORAL
  Filled 2023-03-31 (×2): qty 1

## 2023-03-31 MED ORDER — HYDROCODONE-ACETAMINOPHEN 5-325 MG PO TABS
1.0000 | ORAL_TABLET | ORAL | Status: DC | PRN
  Administered 2023-04-01: 1 via ORAL
  Administered 2023-04-02: 2 via ORAL
  Filled 2023-03-31: qty 2
  Filled 2023-03-31: qty 1

## 2023-03-31 MED ORDER — FENTANYL CITRATE (PF) 100 MCG/2ML IJ SOLN
INTRAMUSCULAR | Status: AC
  Filled 2023-03-31: qty 2

## 2023-03-31 MED ORDER — PHENYLEPHRINE HCL (PRESSORS) 10 MG/ML IV SOLN
INTRAVENOUS | Status: AC
  Filled 2023-03-31: qty 1

## 2023-03-31 MED ORDER — DIPHENHYDRAMINE HCL 12.5 MG/5ML PO ELIX: 12.5000 mg | ORAL_SOLUTION | ORAL | Status: DC | PRN

## 2023-03-31 MED ORDER — PROPOFOL 10 MG/ML IV BOLUS
INTRAVENOUS | Status: DC | PRN
  Administered 2023-03-31: 130 mg via INTRAVENOUS

## 2023-03-31 MED ORDER — FENTANYL CITRATE PF 50 MCG/ML IJ SOSY: 25.0000 ug | PREFILLED_SYRINGE | INTRAMUSCULAR | Status: DC | PRN

## 2023-03-31 MED ORDER — MENTHOL 3 MG MT LOZG: 1.0000 | LOZENGE | OROMUCOSAL | Status: DC | PRN

## 2023-03-31 MED ORDER — CHLORHEXIDINE GLUCONATE 0.12 % MT SOLN
15.0000 mL | Freq: Once | OROMUCOSAL | Status: AC
  Administered 2023-03-31: 15 mL via OROMUCOSAL

## 2023-03-31 MED ORDER — DEXAMETHASONE SODIUM PHOSPHATE 10 MG/ML IJ SOLN
INTRAMUSCULAR | Status: AC
  Filled 2023-03-31: qty 1

## 2023-03-31 MED ORDER — ONDANSETRON HCL 4 MG/2ML IJ SOLN: 4.0000 mg | Freq: Four times a day (QID) | INTRAMUSCULAR | Status: DC | PRN

## 2023-03-31 MED ORDER — ASPIRIN 81 MG PO CHEW
81.0000 mg | CHEWABLE_TABLET | Freq: Two times a day (BID) | ORAL | Status: DC
  Administered 2023-04-01 – 2023-04-04 (×7): 81 mg via ORAL
  Filled 2023-03-31 (×7): qty 1

## 2023-03-31 MED ORDER — FENTANYL CITRATE (PF) 250 MCG/5ML IJ SOLN
INTRAMUSCULAR | Status: AC
  Filled 2023-03-31: qty 5

## 2023-03-31 MED ORDER — MORPHINE SULFATE (PF) 2 MG/ML IV SOLN
0.5000 mg | INTRAVENOUS | Status: DC | PRN
  Administered 2023-04-02 (×2): 1 mg via INTRAVENOUS
  Filled 2023-03-31 (×2): qty 1

## 2023-03-31 MED ORDER — ONDANSETRON HCL 4 MG PO TABS: 4.0000 mg | ORAL_TABLET | Freq: Four times a day (QID) | ORAL | Status: DC | PRN

## 2023-03-31 MED ORDER — SUGAMMADEX SODIUM 200 MG/2ML IV SOLN
INTRAVENOUS | Status: DC | PRN
  Administered 2023-03-31: 200 mg via INTRAVENOUS

## 2023-03-31 MED ORDER — ROCURONIUM BROMIDE 10 MG/ML (PF) SYRINGE
PREFILLED_SYRINGE | INTRAVENOUS | Status: AC
  Filled 2023-03-31: qty 10

## 2023-03-31 MED ORDER — ACETAMINOPHEN 10 MG/ML IV SOLN
INTRAVENOUS | Status: DC | PRN
  Administered 2023-03-31: 1000 mg via INTRAVENOUS

## 2023-03-31 MED ORDER — DEXAMETHASONE SODIUM PHOSPHATE 10 MG/ML IJ SOLN
INTRAMUSCULAR | Status: DC | PRN
  Administered 2023-03-31: 8 mg via INTRAVENOUS

## 2023-03-31 MED ORDER — PHENYLEPHRINE 80 MCG/ML (10ML) SYRINGE FOR IV PUSH (FOR BLOOD PRESSURE SUPPORT)
PREFILLED_SYRINGE | INTRAVENOUS | Status: DC | PRN
  Administered 2023-03-31: 160 ug via INTRAVENOUS
  Administered 2023-03-31: 80 ug via INTRAVENOUS
  Administered 2023-03-31: 160 ug via INTRAVENOUS

## 2023-03-31 MED ORDER — 0.9 % SODIUM CHLORIDE (POUR BTL) OPTIME
TOPICAL | Status: DC | PRN
  Administered 2023-03-31: 1000 mL

## 2023-03-31 MED ORDER — ACETAMINOPHEN 10 MG/ML IV SOLN
INTRAVENOUS | Status: AC
  Filled 2023-03-31: qty 100

## 2023-03-31 MED ORDER — LIDOCAINE HCL (PF) 2 % IJ SOLN
INTRAMUSCULAR | Status: AC
  Filled 2023-03-31: qty 5

## 2023-03-31 MED ORDER — METOCLOPRAMIDE HCL 5 MG PO TABS: 5.0000 mg | ORAL_TABLET | Freq: Three times a day (TID) | ORAL | Status: DC | PRN

## 2023-03-31 MED ORDER — MIDAZOLAM HCL 2 MG/2ML IJ SOLN
INTRAMUSCULAR | Status: AC
  Filled 2023-03-31: qty 2

## 2023-03-31 MED ORDER — ONDANSETRON HCL 4 MG/2ML IJ SOLN: 4.0000 mg | Freq: Once | INTRAMUSCULAR | Status: DC | PRN

## 2023-03-31 MED ORDER — MUPIROCIN 2 % EX OINT
1.0000 | TOPICAL_OINTMENT | Freq: Two times a day (BID) | CUTANEOUS | Status: DC
  Administered 2023-03-31 – 2023-04-04 (×8): 1 via NASAL
  Filled 2023-03-31 (×2): qty 22

## 2023-03-31 MED ORDER — ONDANSETRON HCL 4 MG/2ML IJ SOLN
INTRAMUSCULAR | Status: AC
  Filled 2023-03-31: qty 2

## 2023-03-31 MED ORDER — SODIUM CHLORIDE 0.9 % IV SOLN: INTRAVENOUS | Status: DC

## 2023-03-31 MED ORDER — KETOROLAC TROMETHAMINE 30 MG/ML IJ SOLN
INTRAMUSCULAR | Status: DC | PRN
  Administered 2023-03-31: 30 mg via INTRAVENOUS

## 2023-03-31 MED ORDER — MIDAZOLAM HCL 5 MG/5ML IJ SOLN
INTRAMUSCULAR | Status: DC | PRN
  Administered 2023-03-31: 2 mg via INTRAVENOUS

## 2023-03-31 MED ORDER — TRANEXAMIC ACID-NACL 1000-0.7 MG/100ML-% IV SOLN
1000.0000 mg | Freq: Once | INTRAVENOUS | Status: AC
  Administered 2023-03-31: 1000 mg via INTRAVENOUS
  Filled 2023-03-31: qty 100

## 2023-03-31 MED ORDER — BISACODYL 10 MG RE SUPP: 10.0000 mg | Freq: Every day | RECTAL | Status: DC | PRN

## 2023-03-31 MED ORDER — LIDOCAINE 2% (20 MG/ML) 5 ML SYRINGE
INTRAMUSCULAR | Status: DC | PRN
  Administered 2023-03-31: 80 mg via INTRAVENOUS

## 2023-03-31 MED ORDER — METOCLOPRAMIDE HCL 5 MG/ML IJ SOLN: 5.0000 mg | Freq: Three times a day (TID) | INTRAMUSCULAR | Status: DC | PRN

## 2023-03-31 MED ORDER — ONDANSETRON HCL 4 MG/2ML IJ SOLN
INTRAMUSCULAR | Status: DC | PRN
  Administered 2023-03-31: 4 mg via INTRAVENOUS

## 2023-03-31 MED ORDER — LACTATED RINGERS IV SOLN: INTRAVENOUS | Status: DC

## 2023-03-31 MED ORDER — METHOCARBAMOL 500 MG IVPB - SIMPLE MED: 500.0000 mg | Freq: Four times a day (QID) | INTRAVENOUS | Status: DC | PRN

## 2023-03-31 MED ORDER — PHENOL 1.4 % MT LIQD: 1.0000 | OROMUCOSAL | Status: DC | PRN

## 2023-03-31 MED ORDER — CEFAZOLIN SODIUM-DEXTROSE 2-4 GM/100ML-% IV SOLN
2.0000 g | Freq: Four times a day (QID) | INTRAVENOUS | Status: AC
  Administered 2023-03-31 – 2023-04-01 (×2): 2 g via INTRAVENOUS
  Filled 2023-03-31 (×2): qty 100

## 2023-03-31 MED ORDER — METHOCARBAMOL 500 MG PO TABS
500.0000 mg | ORAL_TABLET | Freq: Four times a day (QID) | ORAL | Status: DC | PRN
  Administered 2023-04-02 – 2023-04-03 (×4): 500 mg via ORAL
  Filled 2023-03-31 (×4): qty 1

## 2023-03-31 MED ORDER — STERILE WATER FOR IRRIGATION IR SOLN
Status: DC | PRN
  Administered 2023-03-31: 2000 mL

## 2023-03-31 SURGICAL SUPPLY — 57 items
ADH SKN CLS LQ APL DERMABOND (GAUZE/BANDAGES/DRESSINGS) ×1
BAG COUNTER SPONGE SURGICOUNT (BAG) IMPLANT
BAG SPEC THK2 15X12 ZIP CLS (MISCELLANEOUS) ×1
BAG SPNG CNTER NS LX DISP (BAG)
BAG ZIPLOCK 12X15 (MISCELLANEOUS) ×1 IMPLANT
BLADE SAW SGTL 11.0X1.19X90.0M (BLADE) IMPLANT
CABLE CERLAGE W/CRIMP 1.8 (Cable) IMPLANT
CABLE GTR COCR 1.8X635 (Orthopedic Implant) IMPLANT
COVER SURGICAL LIGHT HANDLE (MISCELLANEOUS) ×1 IMPLANT
DERMABOND ADVANCED .7 DNX6 (GAUZE/BANDAGES/DRESSINGS) IMPLANT
DRAPE INCISE IOBAN 66X45 STRL (DRAPES) ×1 IMPLANT
DRAPE ORTHO SPLIT 77X108 STRL (DRAPES) ×2
DRAPE POUCH INSTRU U-SHP 10X18 (DRAPES) ×1 IMPLANT
DRAPE SURG 17X11 SM STRL (DRAPES) ×1 IMPLANT
DRAPE SURG ORHT 6 SPLT 77X108 (DRAPES) ×2 IMPLANT
DRAPE U-SHAPE 47X51 STRL (DRAPES) ×1 IMPLANT
DRSG AQUACEL AG ADV 3.5X14 (GAUZE/BANDAGES/DRESSINGS) IMPLANT
DRSG EMULSION OIL 3X16 NADH (GAUZE/BANDAGES/DRESSINGS) ×1 IMPLANT
DURAPREP 26ML APPLICATOR (WOUND CARE) ×1 IMPLANT
ELECT BLADE TIP CTD 4 INCH (ELECTRODE) ×1 IMPLANT
ELECT REM PT RETURN 15FT ADLT (MISCELLANEOUS) ×1 IMPLANT
EVACUATOR 1/8 PVC DRAIN (DRAIN) ×1 IMPLANT
FACESHIELD WRAPAROUND (MASK) ×4 IMPLANT
FACESHIELD WRAPAROUND OR TEAM (MASK) ×4 IMPLANT
GAUZE PAD ABD 8X10 STRL (GAUZE/BANDAGES/DRESSINGS) ×2 IMPLANT
GAUZE SPONGE 4X4 12PLY STRL (GAUZE/BANDAGES/DRESSINGS) ×2 IMPLANT
GLOVE BIOGEL M 7.0 STRL (GLOVE) IMPLANT
GLOVE BIOGEL PI IND STRL 7.5 (GLOVE) ×2 IMPLANT
GLOVE BIOGEL PI IND STRL 8.5 (GLOVE) ×1 IMPLANT
GLOVE ECLIPSE 8.0 STRL XLNG CF (GLOVE) IMPLANT
GLOVE INDICATOR 6.5 STRL GRN (GLOVE) ×1 IMPLANT
GLOVE SURG ORTHO 8.0 STRL STRW (GLOVE) ×1 IMPLANT
GOWN STRL REUS W/ TWL LRG LVL3 (GOWN DISPOSABLE) ×2 IMPLANT
GOWN STRL REUS W/TWL LRG LVL3 (GOWN DISPOSABLE) ×2
IMMOBILIZER KNEE 20 (SOFTGOODS) IMPLANT
IMMOBILIZER KNEE 20 THIGH 36 (SOFTGOODS) IMPLANT
KIT BASIN OR (CUSTOM PROCEDURE TRAY) ×1 IMPLANT
KIT TURNOVER KIT A (KITS) IMPLANT
MANIFOLD NEPTUNE II (INSTRUMENTS) ×1 IMPLANT
NS IRRIG 1000ML POUR BTL (IV SOLUTION) ×2 IMPLANT
PACK TOTAL JOINT (CUSTOM PROCEDURE TRAY) ×1 IMPLANT
PASSER SUT SWANSON 36MM LOOP (INSTRUMENTS) IMPLANT
PROTECTOR NERVE ULNAR (MISCELLANEOUS) ×1 IMPLANT
SPONGE T-LAP 4X18 ~~LOC~~+RFID (SPONGE) ×1 IMPLANT
STAPLER VISISTAT 35W (STAPLE) ×1 IMPLANT
STRIP CLOSURE SKIN 1/2X4 (GAUZE/BANDAGES/DRESSINGS) IMPLANT
SUCTION TUBE FRAZIER 10FR DISP (SUCTIONS) ×1 IMPLANT
SUT ETHIBOND NAB CT1 #1 30IN (SUTURE) IMPLANT
SUT MNCRL AB 4-0 PS2 18 (SUTURE) IMPLANT
SUT STRATAFIX PDS+ 0 24IN (SUTURE) IMPLANT
SUT VIC AB 1 CT1 36 (SUTURE) ×3 IMPLANT
SUT VIC AB 2-0 CT1 27 (SUTURE) ×3
SUT VIC AB 2-0 CT1 TAPERPNT 27 (SUTURE) ×3 IMPLANT
TOWEL OR 17X26 10 PK STRL BLUE (TOWEL DISPOSABLE) ×2 IMPLANT
TRAY FOLEY MTR SLVR 16FR STAT (SET/KITS/TRAYS/PACK) ×1 IMPLANT
TUBE SUCTION HIGH CAP CLEAR NV (SUCTIONS) IMPLANT
WATER STERILE IRR 1000ML POUR (IV SOLUTION) ×1 IMPLANT

## 2023-03-31 NOTE — Progress Notes (Signed)
[facility]  PROGRESS NOTE  Taylor Ochoa [id number] DOB: 09-03-1956 DOA: 03/26/2023 PCP: Etta Grandchild, MD   Brief HPI:   67 y.o. male with medical history significant of COPD, right hip replacement in October 2023  Presented with   mechanical fall. Pt was waiting on the bus and stumble falling gon the right side hitting right elbow and right hip but denies head injury  Hx of A.fib but only on aspirin CT right hip showed right hip arthroplasty, associated periprosthetic fracture involving greater trochanter and anterior proximal femoral shaft.  Orthopedics was consulted.    Assessment/Plan:   Periprosthetic fracture -Orthopedics consulted, plan for surgery on Monday -Continue Lovenox for DVT prophylaxis, Percocet, Robaxin as needed -Continue nonweightbearing right lower extremity -Keep n.p.o. for surgery today   Preop evaluation -Patient baseline able to walk a flight of stairs 100 feet -No cardiac history -Patient is moderate cardiac risk for surgery; no further workup indicated   History of atrial fibrillation -EKG showed normal sinus rhythm -Patient had brief episode of A-fib last year in October -Was not started on anticoagulation     GERD (gastroesophageal reflux disease) Continue Protonix 40 mg daily   Hyperlipidemia with target LDL less than 130 Chronic stable continue Crestor 20 mg a day   BPH with obstruction/lower urinary tract symptoms Continue Flomax   COPD (chronic obstructive pulmonary disease) (HCC) Chronic mild not on oxygen support as needed    Medications     gabapentin  600 mg Oral TID   multivitamin with minerals  1 tablet Oral Daily   mupirocin ointment  1 Application Nasal BID   nicotine  14 mg Transdermal Daily   pantoprazole  40 mg Oral Daily   povidone-iodine  2 Application Topical Once   Ensure Max Protein  11 oz Oral Daily   rosuvastatin  20 mg Oral Daily   sertraline  100 mg Oral Daily   umeclidinium bromide  1 puff  Inhalation Daily     Data Reviewed:   CBG:  No results for input(s): "GLUCAP" in the last 168 hours.  SpO2: 92 %    Vitals:   03/30/23 1052 03/30/23 2053 03/31/23 0430 03/31/23 0753  BP:  122/78 133/78   Pulse:  85 88   Resp:  20 20   Temp:  98.7 F (37.1 C) 97.9 F (36.6 C)   TempSrc:      SpO2: 93% 96% 98% 92%  Weight:      Height:          Data Reviewed:  Basic Metabolic Panel: Recent Labs  Lab 03/26/23 1937 03/27/23 0013 03/27/23 1010  NA 132* 137 138  K 3.4* 4.0 4.4  CL 101 104 104  CO2 21* 26 26  GLUCOSE 108* 121* 121*  BUN 15 15 16   CREATININE 0.95 1.04 0.95  CALCIUM 8.6* 8.5* 8.7*  MG  --  2.0  --   PHOS  --  4.2  --     CBC: Recent Labs  Lab 03/26/23 1937 03/27/23 1010  WBC 12.0* 9.2  HGB 12.7* 13.8  HCT 38.1* 42.1  MCV 95.7 97.0  PLT 215 232    LFT Recent Labs  Lab 03/27/23 0013 03/27/23 1010  AST 18 18  ALT 13 14  ALKPHOS 50 54  BILITOT 0.6 0.9  PROT 6.0* 6.5  ALBUMIN 3.2* 3.5     Antibiotics: Anti-infectives (From admission, onward)    Start     Dose/Rate Route Frequency Ordered Stop  03/31/23 0600  ceFAZolin (ANCEF) IVPB 2g/100 mL premix        2 g 200 mL/hr over 30 Minutes Intravenous On call to O.R. 03/30/23 0009 04/01/23 0559        DVT prophylaxis: Lovenox  Code Status: Full code  Family Communication:    CONSULTS Orthopedics   Subjective   Plan for surgery today.  Pain well-controlled   Objective    Physical Examination:  General-appears in no acute distress Heart-S1-S2, regular, no murmur auscultated Lungs-clear to auscultation bilaterally Abdomen-soft, nontender, no organomegaly Extremities-no edema in the lower extremities Neuro-alert, oriented x3, no focal deficit noted   Status is: Inpatient:             [name]   [facility] If 7PM-7AM, please contact night-coverage at [email or web address], Office  [phone]   03/31/2023, 8:26 AM  LOS: 4 days

## 2023-03-31 NOTE — Brief Op Note (Signed)
03/26/2023 - 03/31/2023  6:46 PM  PATIENT:  Taylor Ochoa  67 y.o. male  PRE-OPERATIVE DIAGNOSIS:  RIGHT PERIPROSTHETIC FEMUR FRACTURE  POST-OPERATIVE DIAGNOSIS:  RIGHT [place] B PERIPROSTHETIC FEMUR FRACTURE  PROCEDURE:  Procedure(s): OPEN REDUCTION INTERNAL FIXATION (ORIF) PERIPROSTHETIC FRACTURE WITH CABLES (Right)  SURGEON:  Surgeon(s) and Role:    Durene Romans, MD - Primary  PHYSICIAN ASSISTANT: Rosalene Billings, PA-C  ANESTHESIA:   general  EBL:  350 mL   BLOOD ADMINISTERED:none  DRAINS: none   LOCAL MEDICATIONS USED:  NONE  SPECIMEN:  No Specimen  DISPOSITION OF SPECIMEN:  N/A  COUNTS:  YES  TOURNIQUET:  * No tourniquets in log *  DICTATION: .Other Dictation: Dictation Number [id number]  PLAN OF CARE: Admit to inpatient   PATIENT DISPOSITION:  PACU - hemodynamically stable.   Delay start of Pharmacological VTE agent (>24hrs) due to surgical blood loss or risk of bleeding: no

## 2023-03-31 NOTE — Interval H&P Note (Signed)
History and Physical Interval Note:  03/31/2023 4:48 PM  Taylor Ochoa  has presented today for surgery, with the diagnosis of RIGHT PERIPROSTHETIC FEMUR FRACTURE.  The various methods of treatment have been discussed with the patient and family. After consideration of risks, benefits and other options for treatment, the patient has consented to  Procedure(s): OPEN REDUCTION INTERNAL FIXATION (ORIF) PERIPROSTHETIC FRACTURE WITH CABLES VS RIGHT TOTAL HIP (Right) as a surgical intervention.  The patient's history has been reviewed, patient examined, no change in status, stable for surgery.  I have reviewed the patient's chart and labs.  Questions were answered to the patient's satisfaction.     Shelda Pal

## 2023-03-31 NOTE — Plan of Care (Signed)
Pt alert and oriented x 4. Forgetful at times but answers all questions appropriately. Pt on bedrest. Primafit changed to condom cath due to leaking. SCD in place. CHG completed and MRSA swab sent. Preprocedure checklist started. Consent in chart. Pt received 1 dose of oxy this shift. Vitals stable.  Problem: Education: Goal: Knowledge of General Education information will improve Description: Including pain rating scale, medication(s)/side effects and non-pharmacologic comfort measures Outcome: Progressing   Problem: Health Behavior/Discharge Planning: Goal: Ability to manage health-related needs will improve Outcome: Progressing   Problem: Clinical Measurements: Goal: Ability to maintain clinical measurements within normal limits will improve Outcome: Progressing Goal: Will remain free from infection Outcome: Progressing Goal: Diagnostic test results will improve Outcome: Progressing Goal: Respiratory complications will improve Outcome: Progressing Goal: Cardiovascular complication will be avoided Outcome: Progressing   Problem: Activity: Goal: Risk for activity intolerance will decrease Outcome: Progressing   Problem: Nutrition: Goal: Adequate nutrition will be maintained Outcome: Progressing   Problem: Coping: Goal: Level of anxiety will decrease Outcome: Progressing   Problem: Elimination: Goal: Will not experience complications related to bowel motility Outcome: Progressing Goal: Will not experience complications related to urinary retention Outcome: Progressing   Problem: Pain Managment: Goal: General experience of comfort will improve Outcome: Progressing   Problem: Safety: Goal: Ability to remain free from injury will improve Outcome: Progressing   Problem: Skin Integrity: Goal: Risk for impaired skin integrity will decrease Outcome: Progressing

## 2023-03-31 NOTE — Op Note (Unsigned)
NAME: Taylor Ochoa, LAY MEDICAL RECORD NO: [id number] ACCOUNT NO: [id number] DATE OF BIRTH: 24-Jul-1956 FACILITY: [facility] LOCATION: [facility] PHYSICIAN: Taylor Frankel. Charlann Boxer, MD  Operative Report   DATE OF PROCEDURE: 03/31/2023  PREOPERATIVE DIAGNOSIS:  Right [place] B periprosthetic femur fracture following right total hip arthroplasty.  POSTOPERATIVE DIAGNOSIS:  Right [place] B periprosthetic femur fracture following right total hip arthroplasty.  PROCEDURE:  Open reduction internal fixation of right periprosthetic femur fracture utilizing 4 Zimmer cables.  SURGEON:  Taylor Frankel. Charlann Boxer, MD  ASSISTANT:  Taylor Billings, PA-C.  Note that Ms. Domenic Schwab was present for the entirety of the case from preoperative positioning, perioperative management of the operative extremity, general facilitation of the case and primary wound closure.  ANESTHESIA:  General.  BLOOD LOSS:  About 350 mL  DRAINS:  None.  COMPLICATIONS:  None.  INDICATIONS FOR PROCEDURE:  The patient is a 67 year old male with a history of a right femoral neck fracture from October 2023.  He had fully recovered from his total hip arthroplasty and was in his routine state of health until he recently fell.  He  sustained immediate onset of pain.  He was initially taken to [facility].  We were contacted based on the nature of his fracture and history of arthroplasty.  He was transferred to [facility] for management.  Based on the nature of his  fracture pattern and the stability of his femoral component I felt that it was in his best interest to undergo an open reduction and internal fixation with cable fixation as opposed to revision surgery.  Risks of malunion, nonunion, hardware failure, DVT  and infection, all discussed and reviewed.  Consent was obtained for management of his fracture.  DESCRIPTION OF PROCEDURE:  The patient was brought to the operative theater.  Once adequate anesthesia, preoperative antibiotics,  Ancef administered as well as tranexamic acid.  He was positioned into the left lateral decubitus position with the right  hip up.  The right lower extremity was then prepped and draped in sterile fashion.  A timeout was performed identifying the patient, planned procedure, and extremity.  Posterior approach was utilized for the hip extending distally along the lateral  aspect of the femur.  The iliotibial band and part of the gluteal fascia was exposed.  This was then incised exposing the vastus lateralis.  We did encounter a fracture hematoma, which was evacuated.  The fracture involving the greater trochanter was  noted to be over the anterior third of the greater trochanter and extended in a longitudinal manner over the anterior aspect of the femur.  I used a bone tenaculum to help reduce the fracture as best could be visualized intraoperatively and by palpation.   With the fracture reduced and held in place with a tenaculum I then worked to pass cables.  I passed one cable inferior to the lesser trochanter over the proximal aspect of the trochanter.  I passed a second cable proximal to the lesser trochanter and  proximal to the end of the fracture site.  I first tensioned down the cable that went over the greater trochanteric tip and then tensioned the second cable in this area over top of that.  These were tensioned appropriately then the screw tightened and  crimped and then cut.  I passed another cable proximal to the lesser trochanter around the fracture site transversely.  This was tensioned and crimped and cut.  I then placed a fourth cable in the distal third of the femoral  stem as prophylaxis.  This  was tensioned and crimped.  Once the cables were all cut and positioned appropriately we reirrigated the hip with normal saline solution.  We then reapproximated the iliotibial band and gluteal fascia using #1 Vicryl and #1 Stratafix suture.  The  remainder of the wound was closed with 2-0 Vicryl  and a running Monocryl stitch.  The hip wound was clean, dry and dressed sterilely using surgical glue and Aquacel dressing.  He was then brought to the recovery room in stable condition, tolerating the  procedure well.  Postoperatively, we will start him on aspirin for DVT prophylaxis.  He will be partial weightbearing on the right lower extremity with no active abduction.  We will see him back in the office in 2 weeks for clinical and radiographic followup.   PUS D: 03/31/2023 6:55:02 pm T: 03/31/2023 8:09:00 pm  JOB: [id number]/ [id number]

## 2023-03-31 NOTE — Anesthesia Preprocedure Evaluation (Addendum)
Anesthesia Evaluation  Patient identified by MRN, date of birth, ID band Patient awake    Reviewed: Allergy & Precautions, NPO status , Patient's Chart, lab work & pertinent test results  Airway Mallampati: II  TM Distance: >3 FB Neck ROM: Full    Dental  (+) Dental Advisory Given, Edentulous Upper, Edentulous Lower   Pulmonary COPD,  COPD inhaler, Current Smoker and Patient abstained from smoking.   Pulmonary exam normal breath sounds clear to auscultation       Cardiovascular negative cardio ROS Normal cardiovascular exam Rhythm:Regular Rate:Normal     Neuro/Psych  PSYCHIATRIC DISORDERS Anxiety Depression     Neuromuscular disease    GI/Hepatic Neg liver ROS,GERD  Medicated,,  Endo/Other  negative endocrine ROS    Renal/GU Renal InsufficiencyRenal disease     Musculoskeletal negative musculoskeletal ROS (+)  RIGHT PERIPROSTHETIC FEMUR FRACTURE   Abdominal   Peds  Hematology negative hematology ROS (+)   Anesthesia Other Findings Day of surgery medications reviewed with the patient.  Reproductive/Obstetrics                             Anesthesia Physical Anesthesia Plan  ASA: 3  Anesthesia Plan: General   Post-op Pain Management: Ofirmev IV (intra-op)* and Toradol IV (intra-op)*   Induction: Intravenous  PONV Risk Score and Plan: 2 and Midazolam, Ondansetron and Dexamethasone  Airway Management Planned: Oral ETT  Additional Equipment:   Intra-op Plan:   Post-operative Plan: Extubation in OR  Informed Consent: I have reviewed the patients History and Physical, chart, labs and discussed the procedure including the risks, benefits and alternatives for the proposed anesthesia with the patient or authorized representative who has indicated his/her understanding and acceptance.     Dental advisory given  Plan Discussed with: CRNA  Anesthesia Plan Comments:         Anesthesia Quick Evaluation

## 2023-03-31 NOTE — H&P (View-Only) (Signed)
Patient ID: Taylor Ochoa, male   DOB: 12/04/1955, 66 y.o.   MRN: [id number]  No specific events overnight NPO  Right hip Vancouver B periprosthetic femur fracture  Plan is to go to OR today for ORIF Orders placed Lovenox D/C'd yesterday Consent order placed  Imaging - I ordered new pelvis to make sure no change in component fixation.  No change to component   CLINICAL DATA:  Closed right hip fracture.   EXAM: PORTABLE PELVIS 1-2 VIEWS   COMPARISON:  Radiograph and CT 03/26/2023   FINDINGS: Right hip arthroplasty. Acute fracture through the right greater trochanter, unchanged in alignment from prior exam. The periprosthetic component on prior CT is not well demonstrated by radiograph. Previous screw fixation of the right sacroiliac joint and right superior pubic ramus. Remote right inferior ramus fracture. Chronic defect of the left inferior ramus.   IMPRESSION: Prior right hip arthroplasty. Acute fracture through the right greater trochanter, unchanged in alignment from prior exam. The peri prosthetic component is not well seen on this portable AP view.     Electronically Signed   By: Melanie  Sanford M.D. 

## 2023-03-31 NOTE — Transfer of Care (Signed)
Immediate Anesthesia Transfer of Care Note  Patient: Taylor Ochoa  Procedure(s) Performed: OPEN REDUCTION INTERNAL FIXATION (ORIF) PERIPROSTHETIC FRACTURE WITH CABLES (Right)  Patient Location: PACU  Anesthesia Type:General  Level of Consciousness: awake, alert , oriented, and patient cooperative  Airway & Oxygen Therapy: Patient Spontanous Breathing and Patient connected to face mask oxygen  Post-op Assessment: Report given to RN and Post -op Vital signs reviewed and stable  Post vital signs: Reviewed and stable  Last Vitals:  Vitals Value Taken Time  BP 121/103 03/31/23 1912  Temp    Pulse 93 03/31/23 1914  Resp 23 03/31/23 1914  SpO2 100 % 03/31/23 1914  Vitals shown include unvalidated device data.  Last Pain:  Vitals:   03/31/23 1632  TempSrc: Oral  PainSc: 7       Patients Stated Pain Goal: 0 (03/29/23 1723)  Complications: No notable events documented.

## 2023-03-31 NOTE — Anesthesia Procedure Notes (Signed)
Procedure Name: Intubation Date/Time: 03/31/2023 5:17 PM  Performed by: Ponciano Ort, CRNAPre-anesthesia Checklist: Patient identified, Emergency Drugs available, Suction available and Patient being monitored Patient Re-evaluated:Patient Re-evaluated prior to induction Oxygen Delivery Method: Circle system utilized Preoxygenation: Pre-oxygenation with 100% oxygen Induction Type: IV induction Ventilation: Mask ventilation without difficulty Laryngoscope Size: Mac and 3 Grade View: Grade I Tube type: Oral Tube size: 7.5 mm Number of attempts: 1 Airway Equipment and Method: Stylet and Oral airway Placement Confirmation: ETT inserted through vocal cords under direct vision, positive ETCO2 and breath sounds checked- equal and bilateral Secured at: 21 cm Tube secured with: Tape Dental Injury: Teeth and Oropharynx as per pre-operative assessment

## 2023-03-31 NOTE — Progress Notes (Signed)
Patient ID: Taylor Ochoa, male   DOB: 1956-04-26, 67 y.o.   MRN: [id number]  No specific events overnight NPO  Right hip Shawna Clamp B periprosthetic femur fracture  Plan is to go to OR today for ORIF Orders placed Lovenox D/C'd yesterday Consent order placed  Imaging - I ordered new pelvis to make sure no change in component fixation.  No change to component   CLINICAL DATA:  Closed right hip fracture.   EXAM: PORTABLE PELVIS 1-2 VIEWS   COMPARISON:  Radiograph and CT 03/26/2023   FINDINGS: Right hip arthroplasty. Acute fracture through the right greater trochanter, unchanged in alignment from prior exam. The periprosthetic component on prior CT is not well demonstrated by radiograph. Previous screw fixation of the right sacroiliac joint and right superior pubic ramus. Remote right inferior ramus fracture. Chronic defect of the left inferior ramus.   IMPRESSION: Prior right hip arthroplasty. Acute fracture through the right greater trochanter, unchanged in alignment from prior exam. The peri prosthetic component is not well seen on this portable AP view.     Electronically Signed   By: Ivette Loyal.D.

## 2023-03-31 NOTE — Progress Notes (Signed)
Patient arrived back from PACU, alert and oriented to self, place, situation, some confusion noted. Able to make needs/wants known. Aquacel dry and intact to right hip with ice pack. NAD

## 2023-03-31 NOTE — Anesthesia Postprocedure Evaluation (Signed)
Anesthesia Post Note  Patient: DURELLE HOLMS  Procedure(s) Performed: OPEN REDUCTION INTERNAL FIXATION (ORIF) PERIPROSTHETIC FRACTURE WITH CABLES (Right)     Patient location during evaluation: PACU Anesthesia Type: General Level of consciousness: awake and alert Pain management: pain level controlled Vital Signs Assessment: post-procedure vital signs reviewed and stable Respiratory status: spontaneous breathing, nonlabored ventilation, respiratory function stable and patient connected to nasal cannula oxygen Cardiovascular status: blood pressure returned to baseline and stable Postop Assessment: no apparent nausea or vomiting Anesthetic complications: no   No notable events documented.  Last Vitals:  Vitals:   03/31/23 1945 03/31/23 2000  BP: (!) 156/85 (!) 161/79  Pulse: 90 92  Resp: 17 18  Temp:    SpO2: 100% 96%    Last Pain:  Vitals:   03/31/23 2015  TempSrc:   PainSc: 0-No pain        RLE Motor Response: Purposeful movement (03/31/23 2015)        Collene Schlichter

## 2023-04-01 ENCOUNTER — Encounter (HOSPITAL_COMMUNITY): Payer: Self-pay | Admitting: Orthopedic Surgery

## 2023-04-01 DIAGNOSIS — S72001A Fracture of unspecified part of neck of right femur, initial encounter for closed fracture: Secondary | ICD-10-CM | POA: Diagnosis not present

## 2023-04-01 DIAGNOSIS — N401 Enlarged prostate with lower urinary tract symptoms: Secondary | ICD-10-CM | POA: Diagnosis not present

## 2023-04-01 DIAGNOSIS — J449 Chronic obstructive pulmonary disease, unspecified: Secondary | ICD-10-CM | POA: Diagnosis not present

## 2023-04-01 DIAGNOSIS — K219 Gastro-esophageal reflux disease without esophagitis: Secondary | ICD-10-CM | POA: Diagnosis not present

## 2023-04-01 LAB — BASIC METABOLIC PANEL
Anion gap: 9 (ref 5–15)
BUN: 22 mg/dL (ref 8–23)
CO2: 25 mmol/L (ref 22–32)
Calcium: 8.2 mg/dL — ABNORMAL LOW (ref 8.9–10.3)
Chloride: 101 mmol/L (ref 98–111)
Creatinine, Ser: 1.1 mg/dL (ref 0.61–1.24)
GFR, Estimated: >60 mL/min (ref >60)
Glucose, Bld: 168 mg/dL — ABNORMAL HIGH (ref 70–99)
Potassium: 4.4 mmol/L (ref 3.5–5.1)
Sodium: 135 mmol/L (ref 135–145)

## 2023-04-01 LAB — CBC
HCT: 36.2 % — ABNORMAL LOW (ref 39.0–52.0)
Hemoglobin: 11.9 g/dL — ABNORMAL LOW (ref 13.0–17.0)
MCH: 32.2 pg (ref 26.0–34.0)
MCHC: 32.9 g/dL (ref 30.0–36.0)
MCV: 98.1 fL (ref 80.0–100.0)
Platelets: 244 10*3/uL (ref 150–400)
RBC: 3.69 MIL/uL — ABNORMAL LOW (ref 4.22–5.81)
RDW: 13.3 % (ref 11.5–15.5)
WBC: 11.4 10*3/uL — ABNORMAL HIGH (ref 4.0–10.5)
nRBC: 0 % (ref 0.0–0.2)

## 2023-04-01 MED ORDER — DEXAMETHASONE SODIUM PHOSPHATE 10 MG/ML IJ SOLN
10.0000 mg | Freq: Once | INTRAMUSCULAR | Status: AC
  Administered 2023-04-01: 10 mg via INTRAVENOUS
  Filled 2023-04-01: qty 1

## 2023-04-01 MED ORDER — ACETAMINOPHEN 325 MG PO TABS
325.0000 mg | ORAL_TABLET | Freq: Four times a day (QID) | ORAL | Status: DC | PRN
  Administered 2023-04-03 (×2): 650 mg via ORAL
  Filled 2023-04-01 (×2): qty 2

## 2023-04-01 MED ORDER — POLYETHYLENE GLYCOL 3350 17 G PO PACK
17.0000 g | PACK | Freq: Two times a day (BID) | ORAL | Status: DC
  Administered 2023-04-01 – 2023-04-03 (×3): 17 g via ORAL
  Filled 2023-04-01 (×3): qty 1

## 2023-04-01 MED ORDER — ENSURE PRE-SURGERY PO LIQD: 296.0000 mL | Freq: Once | ORAL | Status: DC
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
## 2023-04-01 NOTE — Care Management Important Message (Signed)
Important Message  Patient Details IM Letter given to the Patient. Name: Taylor Ochoa MRN: [id number] Date of Birth: [birth year]   Medicare Important Message Given:  Yes     Caren Macadam 04/01/2023, 2:21 PM

## 2023-04-01 NOTE — Progress Notes (Signed)
Patient ID: Taylor Ochoa, male   DOB: 09/04/1956, 67 y.o.   MRN: [id number] Subjective: 1 Day Post-Op Procedure(s) (LRB): OPEN REDUCTION INTERNAL FIXATION (ORIF) PERIPROSTHETIC FRACTURE WITH CABLES (Right)    Patient is fairly confused. He does have soreness around the right hip though  Objective:   VITALS:   Vitals:   04/01/23 0544 04/01/23 0755  BP: (!) 145/80   Pulse: 81   Resp: 18   Temp: 98.1 F (36.7 C)   SpO2: 95% 96%    Neurovascular intact Incision: dressing C/D/I - right hip  LABS Recent Labs    04/01/23 0404  HGB 11.9*  HCT 36.2*  WBC 11.4*  PLT 244    Recent Labs    04/01/23 0404  NA 135  K 4.4  BUN 22  CREATININE 1.10  GLUCOSE 168*    No results for input(s): "LABPT", "INR" in the last 72 hours.   Assessment/Plan: 1 Day Post-Op Procedure(s) (LRB): OPEN REDUCTION INTERNAL FIXATION (ORIF) PERIPROSTHETIC FRACTURE WITH CABLES (Right)   Advance diet Up with therapy PWB RLE, no active abduction Would maintain minimal pain meds until his mental status fully clears Most likely will go to SNF short term post op as he had done after his THR in October RTC in 2 weeks ASA for DVT prophylaxis

## 2023-04-01 NOTE — Progress Notes (Signed)
[facility]  PROGRESS NOTE  Taylor Ochoa WUJ:[id number] DOB: 08/28/1956 DOA: 03/26/2023 PCP: Etta Grandchild, MD   Brief HPI:   67 y.o. male with medical history significant of COPD, right hip replacement in October 2023  Presented with   mechanical fall. Pt was waiting on the bus and stumble falling gon the right side hitting right elbow and right hip but denies head injury  Hx of A.fib but only on aspirin CT right hip showed right hip arthroplasty, associated periprosthetic fracture involving greater trochanter and anterior proximal femoral shaft.  Orthopedics was consulted.    Assessment/Plan:   S/p ORIF right periprosthetic fracture -Orthopedics consulted, underwent ORIF on 7/8 -Aspirin for DVT prophylaxis, Percocet, Robaxin as needed -Plan to go to skilled nursing facility for rehab    Preop evaluation -Patient baseline able to walk a flight of stairs 100 feet -No cardiac history -Patient is moderate cardiac risk for surgery; no further workup indicated   History of atrial fibrillation -EKG showed normal sinus rhythm -Patient had brief episode of A-fib last year in October -Was not started on anticoagulation     GERD (gastroesophageal reflux disease) Continue Protonix 40 mg daily   Hyperlipidemia with target LDL less than 130 Chronic stable continue Crestor 20 mg a day   BPH with obstruction/lower urinary tract symptoms Continue Flomax   COPD (chronic obstructive pulmonary disease) (HCC) Chronic mild not on oxygen support as needed  Postop delirium -Minimize narcotics  Medications     aspirin  81 mg Oral BID   dexamethasone (DECADRON) injection  10 mg Intravenous Once   docusate sodium  100 mg Oral BID   gabapentin  600 mg Oral TID   multivitamin with minerals  1 tablet Oral Daily   mupirocin ointment  1 Application Nasal BID   nicotine  14 mg Transdermal Daily   pantoprazole  40 mg Oral Daily   polyethylene glycol  17 g Oral BID   Ensure Max  Protein  11 oz Oral Daily   rosuvastatin  20 mg Oral Daily   sertraline  100 mg Oral Daily   umeclidinium bromide  1 puff Inhalation Daily     Data Reviewed:   CBG:  No results for input(s): "GLUCAP" in the last 168 hours.  SpO2: 95 % O2 Flow Rate (L/min): 2 L/min    Vitals:   03/31/23 2046 03/31/23 2243 04/01/23 0246 04/01/23 0544  BP: (!) 143/81 134/82 113/72 (!) 145/80  Pulse: 94 87 72 81  Resp: 18 16 20 18   Temp: 97.6 F (36.4 C) 97.7 F (36.5 C) 97.8 F (36.6 C) 98.1 F (36.7 C)  TempSrc:      SpO2: 95% 99% 95% 95%  Weight:      Height:          Data Reviewed:  Basic Metabolic Panel: Recent Labs  Lab 03/26/23 1937 03/27/23 0013 03/27/23 1010 04/01/23 0404  NA 132* 137 138 135  K 3.4* 4.0 4.4 4.4  CL 101 104 104 101  CO2 21* 26 26 25   GLUCOSE 108* 121* 121* 168*  BUN 15 15 16 22   CREATININE 0.95 1.04 0.95 1.10  CALCIUM 8.6* 8.5* 8.7* 8.2*  MG  --  2.0  --   --   PHOS  --  4.2  --   --     CBC: Recent Labs  Lab 03/26/23 1937 03/27/23 1010 04/01/23 0404  WBC 12.0* 9.2 11.4*  HGB 12.7* 13.8 11.9*  HCT 38.1* 42.1  36.2*  MCV 95.7 97.0 98.1  PLT 215 232 244    LFT Recent Labs  Lab 03/27/23 0013 03/27/23 1010  AST 18 18  ALT 13 14  ALKPHOS 50 54  BILITOT 0.6 0.9  PROT 6.0* 6.5  ALBUMIN 3.2* 3.5     Antibiotics: Anti-infectives (From admission, onward)    Start     Dose/Rate Route Frequency Ordered Stop   03/31/23 2300  ceFAZolin (ANCEF) IVPB 2g/100 mL premix        2 g 200 mL/hr over 30 Minutes Intravenous Every 6 hours 03/31/23 2036 04/01/23 0623   03/31/23 0600  ceFAZolin (ANCEF) IVPB 2g/100 mL premix        2 g 200 mL/hr over 30 Minutes Intravenous On call to O.R. 03/30/23 0009 03/31/23 1719        DVT prophylaxis: Lovenox  Code Status: Full code  Family Communication:    CONSULTS Orthopedics   Subjective   Pleasantly confused   Objective    Physical Examination:  General-appears in no acute  distress Heart-S1-S2, regular, no murmur auscultated Lungs-clear to auscultation bilaterally, no wheezing or crackles auscultated Abdomen-soft, nontender, no organomegaly Extremities-no edema in the lower extremities Neuro-alert, oriented to self and place only   Status is: Inpatient:             Eun Vermeer S Dalma Panchal   [facility] If 7PM-7AM, please contact night-coverage at [email or web address], Office  [phone]   04/01/2023, 7:51 AM  LOS: 5 days

## 2023-04-02 DIAGNOSIS — Z96649 Presence of unspecified artificial hip joint: Secondary | ICD-10-CM

## 2023-04-02 DIAGNOSIS — M978XXA Periprosthetic fracture around other internal prosthetic joint, initial encounter: Secondary | ICD-10-CM

## 2023-04-02 LAB — CBC
HCT: 32.5 % — ABNORMAL LOW (ref 39.0–52.0)
Hemoglobin: 10.9 g/dL — ABNORMAL LOW (ref 13.0–17.0)
MCH: 32.3 pg (ref 26.0–34.0)
MCHC: 33.5 g/dL (ref 30.0–36.0)
MCV: 96.4 fL (ref 80.0–100.0)
Platelets: 263 10*3/uL (ref 150–400)
RBC: 3.37 MIL/uL — ABNORMAL LOW (ref 4.22–5.81)
RDW: 13.3 % (ref 11.5–15.5)
WBC: 12.8 10*3/uL — ABNORMAL HIGH (ref 4.0–10.5)
nRBC: 0 % (ref 0.0–0.2)

## 2023-04-02 MED ORDER — TRAMADOL HCL 50 MG PO TABS: 50.0000 mg | ORAL_TABLET | Freq: Four times a day (QID) | ORAL | 0 refills | Status: DC | PRN

## 2023-04-02 MED ORDER — HYDROCODONE-ACETAMINOPHEN 5-325 MG PO TABS: 1.0000 | ORAL_TABLET | Freq: Four times a day (QID) | ORAL | Status: DC | PRN

## 2023-04-02 MED ORDER — POLYETHYLENE GLYCOL 3350 17 G PO PACK: 17.0000 g | PACK | Freq: Two times a day (BID) | ORAL | 0 refills | Status: DC

## 2023-04-02 MED ORDER — TRAMADOL HCL 50 MG PO TABS
50.0000 mg | ORAL_TABLET | Freq: Four times a day (QID) | ORAL | Status: DC | PRN
  Administered 2023-04-02 – 2023-04-04 (×5): 50 mg via ORAL
  Filled 2023-04-02 (×5): qty 1

## 2023-04-02 MED ORDER — METHOCARBAMOL 500 MG PO TABS: 500.0000 mg | ORAL_TABLET | Freq: Four times a day (QID) | ORAL | 0 refills | Status: DC | PRN

## 2023-04-02 MED ORDER — ASPIRIN 81 MG PO CHEW: 81.0000 mg | CHEWABLE_TABLET | Freq: Two times a day (BID) | ORAL | 0 refills | Status: AC
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
## 2023-04-02 NOTE — TOC Progression Note (Signed)
Transition of Care [facility]) - Progression Note    Patient Details  Name: Taylor Ochoa MRN: [id number] Date of Birth: Aug 09, 1956  [facility] CM/SW Contact  Huston Foley Jacklynn Ganong, RN Phone Number: 04/02/2023, 1:17 PM  Clinical Narrative:    Patient is s/p ORIF with cable placement, right femur fracture.  No preference for [facility] agency, referral called to Whitney Post, Liaison with [facility]. Patient has Rollator, RW and a cane at home.   Expected Discharge Plan: Home w Home Health Services    Expected Discharge Plan and Services   Discharge Planning Services: CM Consult Post Acute Care Choice: Home Health Living arrangements for the past 2 months: Single Family Home                 DME Arranged: N/A         HH Arranged: PT HH Agency: Other - See comment ([facility]) Date HH Agency Contacted: 04/02/23 Time HH Agency Contacted: 1300 Representative spoke with at [facility] Agency: Rhunette Croft   Social Determinants of Health (SDOH) Interventions SDOH Screenings   Food Insecurity: No Food Insecurity (03/27/2023)  Housing: Low Risk  (03/27/2023)  Transportation Needs: No Transportation Needs (03/27/2023)  Utilities: Not At Risk (03/27/2023)  Alcohol Screen: Low Risk  (09/25/2022)  Depression (PHQ2-9): Medium Risk (12/18/2022)  Financial Resource Strain: Low Risk  (09/25/2022)  Physical Activity: Sufficiently Active (09/25/2022)  Social Connections: Unknown (09/25/2022)  Stress: No Stress Concern Present (09/25/2022)  Tobacco Use: High Risk (04/01/2023)    Readmission Risk Interventions    07/18/2022    3:57 PM  Readmission Risk Prevention Plan  Post Dischage Appt Complete  Medication Screening Complete  Transportation Screening Complete
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
## 2023-04-02 NOTE — Progress Notes (Signed)
PROGRESS NOTE    Taylor Ochoa  WUJ:[id number] DOB: [birth year] DOA: 03/26/2023 PCP: Etta Grandchild, MD   Brief Narrative: 67 y.o. male with medical history significant of COPD, right hip replacement in October 2023  Presented with   mechanical fall. Pt was waiting on the bus and stumble falling gon the right side hitting right elbow and right hip but denies head injury  Hx of A.fib but only on aspirin CT right hip showed right hip arthroplasty, associated periprosthetic fracture involving greater trochanter and anterior proximal femoral shaft.  Orthopedics was consulted.  Assessment & Plan:   Principal Problem:   Hip fracture (HCC) Active Problems:   GERD (gastroesophageal reflux disease)   Tobacco abuse   Hyperlipidemia with target LDL less than 130   BPH with obstruction/lower urinary tract symptoms   COPD (chronic obstructive pulmonary disease) (HCC)  S/p ORIF right periprosthetic fracture -Orthopedics consulted, underwent ORIF on 7/8 -Aspirin for DVT prophylaxis, Percocet, Robaxin as needed -Plan to go to with home health on 04/03/2023    History of atrial fibrillation -EKG showed normal sinus rhythm -Patient had brief episode of A-fib last year in October -Was not started on anticoagulation   GERD (gastroesophageal reflux disease) Continue Protonix 40 mg daily  Hyperlipidemia with target LDL less than 130 Chronic stable continue Crestor 20 mg a day   BPH with obstruction/lower urinary tract symptoms Continue Flomax   COPD (chronic obstructive pulmonary disease) (HCC) Chronic mild not on oxygen support as needed   Postop delirium -Minimize narcotics    Nutrition Problem: Increased nutrient needs Etiology: acute illness (right hip fracture)     Signs/Symptoms: estimated needs    Interventions: Refer to RD note for recommendations, Premier Protein, MVI  Estimated body mass index is 24.17 kg/m as calculated from the following:   Height as of this  encounter: 5\' 7"  (1.702 m).   Weight as of this encounter: 70 kg.  DVT prophylaxis: aspirin Code Status: full Family Communication:none Disposition Plan:  Status is: Inpatient Remains inpatient appropriate because: hip fracture    Consultants: ortho  Procedures:orif Antimicrobials:none  Subjective: C/o pain with movement  Objective: Vitals:   04/02/23 0434 04/02/23 0751 04/02/23 0845 04/02/23 0846  BP: 121/66  110/73   Pulse: 72  80 81  Resp: 17   16  Temp: 97.9 F (36.6 C)     TempSrc: Oral     SpO2: 96% 98%  97%  Weight:      Height:        Intake/Output Summary (Last 24 hours) at 04/02/2023 1526 Last data filed at 04/02/2023 0900 Gross per 24 hour  Intake 240 ml  Output 1400 ml  Net -1160 ml   Filed Weights   03/26/23 2116 03/31/23 1632  Weight: 70 kg 70 kg    Examination:  General exam: Appears  chronically ill appearing  Respiratory system: Clear to auscultation. Respiratory effort normal. Cardiovascular system: S1 & S2 heard, RRR. No JVD, murmurs, rubs, gallops or clicks. No pedal edema. Gastrointestinal system: Abdomen is nondistended, soft and nontender. No organomegaly or masses felt. Normal bowel sounds heard. Central nervous system: Alert and oriented. No focal neurological deficits. Extremities: trace right edema   Data Reviewed: I have personally reviewed following labs and imaging studies  CBC: Recent Labs  Lab 03/26/23 1937 03/27/23 1010 04/01/23 0404 04/02/23 0439  WBC 12.0* 9.2 11.4* 12.8*  HGB 12.7* 13.8 11.9* 10.9*  HCT 38.1* 42.1 36.2* 32.5*  MCV 95.7 97.0 98.1 96.4  PLT 215 232 244 263   Basic Metabolic Panel: Recent Labs  Lab 03/26/23 1937 03/27/23 0013 03/27/23 1010 04/01/23 0404  NA 132* 137 138 135  K 3.4* 4.0 4.4 4.4  CL 101 104 104 101  CO2 21* 26 26 25   GLUCOSE 108* 121* 121* 168*  BUN 15 15 16 22   CREATININE 0.95 1.04 0.95 1.10  CALCIUM 8.6* 8.5* 8.7* 8.2*  MG  --  2.0  --   --   PHOS  --  4.2  --   --     GFR: Estimated Creatinine Clearance: 61.8 mL/min (by C-G formula based on SCr of 1.1 mg/dL). Liver Function Tests: Recent Labs  Lab 03/27/23 0013 03/27/23 1010  AST 18 18  ALT 13 14  ALKPHOS 50 54  BILITOT 0.6 0.9  PROT 6.0* 6.5  ALBUMIN 3.2* 3.5   No results for input(s): "LIPASE", "AMYLASE" in the last 168 hours. No results for input(s): "AMMONIA" in the last 168 hours. Coagulation Profile: Recent Labs  Lab 03/26/23 1937 03/27/23 0013  INR 1.0 1.0   Cardiac Enzymes: Recent Labs  Lab 03/27/23 0013  CKTOTAL 244   BNP (last 3 results) No results for input(s): "PROBNP" in the last 8760 hours. HbA1C: No results for input(s): "HGBA1C" in the last 72 hours. CBG: No results for input(s): "GLUCAP" in the last 168 hours. Lipid Profile: No results for input(s): "CHOL", "HDL", "LDLCALC", "TRIG", "CHOLHDL", "LDLDIRECT" in the last 72 hours. Thyroid Function Tests: No results for input(s): "TSH", "T4TOTAL", "FREET4", "T3FREE", "THYROIDAB" in the last 72 hours. Anemia Panel: No results for input(s): "VITAMINB12", "FOLATE", "FERRITIN", "TIBC", "IRON", "RETICCTPCT" in the last 72 hours. Sepsis Labs: No results for input(s): "PROCALCITON", "LATICACIDVEN" in the last 168 hours.  Recent Results (from the past 240 hour(s))  Surgical PCR screen     Status: None   Collection Time: 03/31/23  6:05 AM   Specimen: Nasal Mucosa; Nasal Swab  Result Value Ref Range Status   MRSA, PCR NEGATIVE NEGATIVE Final   Staphylococcus aureus NEGATIVE NEGATIVE Final    Comment: (NOTE) The Xpert SA Assay (FDA approved for NASAL specimens in patients 98 years of age and older), is one component of a comprehensive surveillance program. It is not intended to diagnose infection nor to guide or monitor treatment. Performed at [facility], [address], [place], [place] [address]          Radiology Studies: DG Pelvis Portable  Result Date: 03/31/2023 CLINICAL DATA:   Status post total right hip arthroplasty. EXAM: PORTABLE PELVIS 1-2 VIEWS COMPARISON:  AP pelvis 03/29/2023, CT right hip 03/26/2023 FINDINGS: Redemonstration of total right hip arthroplasty. Redemonstration of screw fixation of the right superior pubic ramus. Old healed right inferior pubic ramus fracture. Redemonstration of total right hip arthroplasty. There is new cerclage wiring fixating the previously seen intertrochanteric periprosthetic fracture is now fixated with new cerclage wires. There is additional new single cerclage wire around the tip of the femoral stem. IMPRESSION: Status post total right hip arthroplasty with new cerclage wiring fixating the previously seen intertrochanteric periprosthetic fracture. Electronically Signed   By: Neita Garnet M.D.   On: 03/31/2023 20:09        Scheduled Meds:  aspirin  81 mg Oral BID   docusate sodium  100 mg Oral BID   gabapentin  600 mg Oral TID   multivitamin with minerals  1 tablet Oral Daily   mupirocin ointment  1 Application Nasal BID   nicotine  14 mg Transdermal Daily   pantoprazole  40 mg Oral Daily   polyethylene glycol  17 g Oral BID   Ensure Max Protein  11 oz Oral Daily   rosuvastatin  20 mg Oral Daily   sertraline  100 mg Oral Daily   umeclidinium bromide  1 puff Inhalation Daily   Continuous Infusions:  sodium chloride 75 mL/hr at 04/01/23 0422   methocarbamol (ROBAXIN) IV       LOS: 6 days  Time spent:30 min Alwyn Ren, MD 04/02/2023, 3:26 PM

## 2023-04-03 ENCOUNTER — Other Ambulatory Visit: Payer: Self-pay

## 2023-04-03 ENCOUNTER — Other Ambulatory Visit (HOSPITAL_COMMUNITY): Payer: Self-pay

## 2023-04-03 DIAGNOSIS — Z96649 Presence of unspecified artificial hip joint: Secondary | ICD-10-CM | POA: Diagnosis not present

## 2023-04-03 DIAGNOSIS — M978XXA Periprosthetic fracture around other internal prosthetic joint, initial encounter: Secondary | ICD-10-CM | POA: Diagnosis not present

## 2023-04-03 MED ORDER — ENSURE ENLIVE PO LIQD
237.0000 mL | Freq: Two times a day (BID) | ORAL | 12 refills | Status: DC
  Filled 2023-04-03: qty 237, 1d supply, fill #0

## 2023-04-03 MED ORDER — ADULT MULTIVITAMIN W/MINERALS CH: 1.0000 | ORAL_TABLET | Freq: Every day | ORAL | Status: DC

## 2023-04-03 MED ORDER — BISACODYL 10 MG RE SUPP
10.0000 mg | Freq: Every day | RECTAL | 0 refills | Status: DC | PRN
  Filled 2023-04-03: qty 12, 12d supply, fill #0

## 2023-04-03 MED ORDER — ENSURE ENLIVE PO LIQD
237.0000 mL | Freq: Two times a day (BID) | ORAL | Status: DC
  Administered 2023-04-03: 237 mL via ORAL

## 2023-04-03 MED ORDER — DOCUSATE SODIUM 100 MG PO CAPS
100.0000 mg | ORAL_CAPSULE | Freq: Two times a day (BID) | ORAL | 0 refills | Status: DC
  Filled 2023-04-03: qty 100, 50d supply, fill #0

## 2023-04-03 NOTE — TOC Progression Note (Addendum)
Transition of Care [facility]) - Progression Note    Patient Details  Name: Taylor Ochoa MRN: [id number] Date of Birth: [birth year]  Transition of Care [facility]) CM/SW Contact  Otelia Santee, LCSW Phone Number: 04/03/2023, 10:40 AM  Clinical Narrative:    Spoke with pt's wife via t/c to discuss SNF placement for this pt. Per wife pt has been to [facility] in [place] in the past and this is their top choice.  Referrals have been sent out for SNF placement. Have contacted [facility] to review and currently awaiting response.   Update 2:00pm: [facility] declined due to no bed availability. Left voicemail with spouse to review other bed offers.   Update 3:40pm- Spoke with pt's spouse to review bed offers. Spouse has accepted offer for SNF at [facility] in [place]. Confirmed acceptance with SNF. Insurance auth currently pending.   Expected Discharge Plan: Home w Home Health Services    Expected Discharge Plan and Services   Discharge Planning Services: CM Consult Post Acute Care Choice: Home Health Living arrangements for the past 2 months: Single Family Home Expected Discharge Date: 04/03/23               DME Arranged: N/A         HH Arranged: PT HH Agency: Other - See comment ([facility]) Date HH Agency Contacted: 04/02/23 Time HH Agency Contacted: 1300 Representative spoke with at [facility] Agency: Rhunette Croft   Social Determinants of Health (SDOH) Interventions SDOH Screenings   Food Insecurity: No Food Insecurity (03/27/2023)  Housing: Low Risk  (03/27/2023)  Transportation Needs: No Transportation Needs (03/27/2023)  Utilities: Not At Risk (03/27/2023)  Alcohol Screen: Low Risk  (09/25/2022)  Depression (PHQ2-9): Medium Risk (12/18/2022)  Financial Resource Strain: Low Risk  (09/25/2022)  Physical Activity: Sufficiently Active (09/25/2022)  Social Connections: Unknown (09/25/2022)  Stress: No Stress Concern Present (09/25/2022)  Tobacco Use: High Risk (04/01/2023)     Readmission Risk Interventions    07/18/2022    3:57 PM  Readmission Risk Prevention Plan  Post Dischage Appt Complete  Medication Screening Complete  Transportation Screening Complete

## 2023-04-03 NOTE — Progress Notes (Signed)
Nutrition Follow-up  DOCUMENTATION CODES:   Not applicable  INTERVENTION:  - Regular diet.  - Ensure Max po once daily, provides 150 kcal and 30 grams of protein.  - Encourage intake at all meals of protein rich food sources.  - Multivitamin with minerals daily - Monitor weight trends.   NUTRITION DIAGNOSIS:   Increased nutrient needs related to acute illness (right hip fracture) as evidenced by estimated needs. *ongoing  GOAL:   Patient will meet greater than or equal to 90% of their needs *progressing   MONITOR:   PO intake, Supplement acceptance, Weight trends  REASON FOR ASSESSMENT:   Consult Hip fracture protocol  ASSESSMENT:   67 y.o. male with PMH significant of COPD, right hip replacement in October 2023 who presented after mechanical fall. Admitted for right hip fracture.  Patient sleeping at time of visit, did not awake to sound of voice.  Per EMR, patient consuming 50-100% of meals. Drinking Ensure Max once daily. Weight stable. Supposed to discharge today but wife now requesting SNF placement.  Admit weight: 154# Current weight: 154#  Medications reviewed and include: Colace, Miralax, MVI  Labs reviewed:  No BMP since 7/9   Diet Order:   Diet Order             Diet - low sodium heart healthy           Diet regular Room service appropriate? Yes; Fluid consistency: Thin  Diet effective now                   EDUCATION NEEDS:  Education needs have been addressed  Skin:  Skin Assessment: Skin Integrity Issues: Skin Integrity Issues:: Incisions Incisions: Right Hip  Last BM:  7/3  Height:  Ht Readings from Last 1 Encounters:  03/31/23 5\' 7"  (1.702 m)   Weight: Wt Readings from Last 1 Encounters:  03/31/23 70 kg    BMI:  Body mass index is 24.17 kg/m.  Estimated Nutritional Needs:  Kcal:  1950-2100 kcals Protein:  75-90 grams Fluid:  >/= 1.9L    Shelle Iron RD, LDN For contact information, refer to [facility].

## 2023-04-03 NOTE — Progress Notes (Signed)
PROGRESS NOTE    Taylor Ochoa  UJW:[id number] DOB: 1956-04-05 DOA: 03/26/2023 PCP: Etta Grandchild, MD   Brief Narrative: 67 y.o. male with medical history significant of COPD, right hip replacement in October 2023  Presented with   mechanical fall. Pt was waiting on the bus and stumble falling gon the right side hitting right elbow and right hip but denies head injury  Hx of A.fib but only on aspirin CT right hip showed right hip arthroplasty, associated periprosthetic fracture involving greater trochanter and anterior proximal femoral shaft.  Orthopedics was consulted.  Assessment & Plan:   Principal Problem:   Hip fracture (HCC) Active Problems:   GERD (gastroesophageal reflux disease)   Tobacco abuse   Hyperlipidemia with target LDL less than 130   BPH with obstruction/lower urinary tract symptoms   COPD (chronic obstructive pulmonary disease) (HCC)   Peri-prosthetic fracture around prosthetic hip  S/p ORIF right periprosthetic fracture -Orthopedics consulted, underwent ORIF on 7/8 -Aspirin for DVT prophylaxis, Percocet, Robaxin as needed -Plan to go to with home health on 04/03/2023    History of atrial fibrillation -EKG showed normal sinus rhythm -Patient had brief episode of A-fib last year in October -Was not started on anticoagulation   GERD (gastroesophageal reflux disease) Continue Protonix 40 mg daily  Hyperlipidemia with target LDL less than 130 Chronic stable continue Crestor 20 mg a day   BPH with obstruction/lower urinary tract symptoms Continue Flomax   COPD (chronic obstructive pulmonary disease) (HCC) Chronic mild not on oxygen support as needed   Postop delirium -Minimize narcotics   Nutrition Problem: Increased nutrient needs Etiology: acute illness (right hip fracture)  Signs/Symptoms: estimated needs  Interventions: Refer to RD note for recommendations, Premier Protein, MVI  Estimated body mass index is 24.17 kg/m as calculated from the  following:   Height as of this encounter: 5\' 7"  (1.702 m).   Weight as of this encounter: 70 kg.  DVT prophylaxis: aspirin Code Status: full Family Communication:none Disposition Plan:  Status is: Inpatient Remains inpatient appropriate because: hip fracture    Consultants: ortho  Procedures:orif Antimicrobials:none  Subjective:  No new events Wife would like the patient now to go to SNF since she is not at home to take care of him as she works.  Objective: Vitals:   04/02/23 1919 04/03/23 0435 04/03/23 0803 04/03/23 0805  BP: 133/74 123/65    Pulse: 83 82    Resp: 18 18    Temp: 98.2 F (36.8 C) 98.3 F (36.8 C)    TempSrc:      SpO2: 98% 96% 96% 96%  Weight:      Height:        Intake/Output Summary (Last 24 hours) at 04/03/2023 1125 Last data filed at 04/03/2023 0430 Gross per 24 hour  Intake --  Output 700 ml  Net -700 ml   Filed Weights   03/26/23 2116 03/31/23 1632  Weight: 70 kg 70 kg    Examination:  General exam: Appears  chronically ill appearing  Respiratory system: Clear to auscultation. Respiratory effort normal. Cardiovascular system: S1 & S2 heard, RRR. No JVD, murmurs, rubs, gallops or clicks. No pedal edema. Gastrointestinal system: Abdomen is nondistended, soft and nontender. No organomegaly or masses felt. Normal bowel sounds heard. Central nervous system: Alert and oriented. No focal neurological deficits. Extremities: trace right edema   Data Reviewed: I have personally reviewed following labs and imaging studies  CBC: Recent Labs  Lab 04/01/23 0404 04/02/23 0439  WBC  11.4* 12.8*  HGB 11.9* 10.9*  HCT 36.2* 32.5*  MCV 98.1 96.4  PLT 244 263   Basic Metabolic Panel: Recent Labs  Lab 04/01/23 0404  NA 135  K 4.4  CL 101  CO2 25  GLUCOSE 168*  BUN 22  CREATININE 1.10  CALCIUM 8.2*   GFR: Estimated Creatinine Clearance: 61.8 mL/min (by C-G formula based on SCr of 1.1 mg/dL). Liver Function Tests: No results for  input(s): "AST", "ALT", "ALKPHOS", "BILITOT", "PROT", "ALBUMIN" in the last 168 hours.  No results for input(s): "LIPASE", "AMYLASE" in the last 168 hours. No results for input(s): "AMMONIA" in the last 168 hours. Coagulation Profile: No results for input(s): "INR", "PROTIME" in the last 168 hours.  Cardiac Enzymes: No results for input(s): "CKTOTAL", "CKMB", "CKMBINDEX", "TROPONINI" in the last 168 hours.  BNP (last 3 results) No results for input(s): "PROBNP" in the last 8760 hours. HbA1C: No results for input(s): "HGBA1C" in the last 72 hours. CBG: No results for input(s): "GLUCAP" in the last 168 hours. Lipid Profile: No results for input(s): "CHOL", "HDL", "LDLCALC", "TRIG", "CHOLHDL", "LDLDIRECT" in the last 72 hours. Thyroid Function Tests: No results for input(s): "TSH", "T4TOTAL", "FREET4", "T3FREE", "THYROIDAB" in the last 72 hours. Anemia Panel: No results for input(s): "VITAMINB12", "FOLATE", "FERRITIN", "TIBC", "IRON", "RETICCTPCT" in the last 72 hours. Sepsis Labs: No results for input(s): "PROCALCITON", "LATICACIDVEN" in the last 168 hours.  Recent Results (from the past 240 hour(s))  Surgical PCR screen     Status: None   Collection Time: 03/31/23  6:05 AM   Specimen: Nasal Mucosa; Nasal Swab  Result Value Ref Range Status   MRSA, PCR NEGATIVE NEGATIVE Final   Staphylococcus aureus NEGATIVE NEGATIVE Final    Comment: (NOTE) The Xpert SA Assay (FDA approved for NASAL specimens in patients 71 years of age and older), is one component of a comprehensive surveillance program. It is not intended to diagnose infection nor to guide or monitor treatment. Performed at [facility], [address], [place], [place] [address]          Radiology Studies: No results found.      Scheduled Meds:  aspirin  81 mg Oral BID   docusate sodium  100 mg Oral BID   feeding supplement  237 mL Oral BID BM   gabapentin  600 mg Oral TID   multivitamin  with minerals  1 tablet Oral Daily   mupirocin ointment  1 Application Nasal BID   nicotine  14 mg Transdermal Daily   pantoprazole  40 mg Oral Daily   polyethylene glycol  17 g Oral BID   Ensure Max Protein  11 oz Oral Daily   rosuvastatin  20 mg Oral Daily   sertraline  100 mg Oral Daily   umeclidinium bromide  1 puff Inhalation Daily   Continuous Infusions:  sodium chloride 75 mL/hr at 04/01/23 0422   methocarbamol (ROBAXIN) IV       LOS: 7 days  Time spent:30 min Alwyn Ren, MD 04/03/2023, 11:25 AM

## 2023-04-03 NOTE — [place] FL2 (Signed)
[place] MEDICAID FL2 LEVEL OF CARE FORM     IDENTIFICATION  Patient Name: Taylor Ochoa Birthdate: Mar 19, 1956 Sex: male Admission Date (Current Location): 03/26/2023  [facility] and [place] Number:  Producer, television/film/video and Address:  [facility],  [address] [place], [place] [address]      Provider Number: [id number]  Attending Physician Name and Address:  Alwyn Ren, MD  Relative Name and Phone Number:  Denton Lank Malone Vanblarcom [phone]    Current Level of Care: Hospital Recommended Level of Care: Skilled Nursing Facility Prior Approval Number:    Date Approved/Denied:   PASRR Number: [id number]  Discharge Plan: SNF    Current Diagnoses: Patient Active Problem List   Diagnosis Date Noted   Peri-prosthetic fracture around prosthetic hip 04/02/2023   Hip fracture (HCC) 03/27/2023   Stage 3a chronic kidney disease (HCC) 03/19/2023   Hx of adenomatous and sessile serrated colonic polyps 12/17/2022   Cecal angiodysplasia 12/04/2022   Encounter for general adult medical examination with abnormal findings 09/23/2022   Manifestations of thiamine deficiency 09/22/2022   Age-related osteoporosis with current pathological fracture 09/18/2022   Vitamin D deficiency disease 09/18/2022   Flu vaccine need 09/18/2022   Vitamin B12 deficiency anemia due to intrinsic factor deficiency 09/18/2022   Paroxysmal atrial fibrillation with RVR (HCC) 07/21/2022   COPD (chronic obstructive pulmonary disease) (HCC) 07/15/2022   BPH with obstruction/lower urinary tract symptoms 07/26/2021   Chronic bilateral low back pain with bilateral sciatica 04/06/2020   Abnormal MRI 03/23/2020   Cerebral ventriculomegaly 02/29/2020   Mucopurulent chronic bronchitis (HCC) 10/12/2019   Prediabetes 10/12/2019   Panlobular emphysema (HCC) 04/08/2019   Hyperlipidemia with target LDL less than 130 04/08/2019   GAD (generalized anxiety disorder) 04/08/2019   Iliac dissection (HCC) 02/18/2018    GERD (gastroesophageal reflux disease) 02/22/2014   Tobacco abuse 02/22/2014    Orientation RESPIRATION BLADDER Height & Weight     Self, Time, Situation, Place  Normal Incontinent, External catheter Weight: 154 lb 5.2 oz (70 kg) Height:  5\' 7"  (170.2 cm)  BEHAVIORAL SYMPTOMS/MOOD NEUROLOGICAL BOWEL NUTRITION STATUS      Continent Diet (Low sodium heart healthy)  AMBULATORY STATUS COMMUNICATION OF NEEDS Skin   Limited Assist Verbally Skin abrasions                       Personal Care Assistance Level of Assistance  Bathing, Feeding, Dressing Bathing Assistance: Limited assistance Feeding assistance: Independent Dressing Assistance: Limited assistance     Functional Limitations Info  Sight, Hearing, Speech Sight Info: Impaired Hearing Info: Adequate Speech Info: Adequate    SPECIAL CARE FACTORS FREQUENCY  PT (By licensed PT), OT (By licensed OT)     PT Frequency: 5x/wk OT Frequency: 5x/wk            Contractures Contractures Info: Not present    Additional Factors Info  Code Status, Allergies Code Status Info: FULL Allergies Info: No Known Allergies           Current Medications (04/03/2023):  This is the current hospital active medication list Current Facility-Administered Medications  Medication Dose Route Frequency Provider Last Rate Last Admin   0.9 %  sodium chloride infusion   Intravenous Continuous Durene Romans, MD 75 mL/hr at 04/01/23 0422 Infusion Verify at 04/01/23 0422   acetaminophen (TYLENOL) tablet 325-650 mg  325-650 mg Oral Q6H PRN Durene Romans, MD   650 mg at 04/03/23 0618   aspirin chewable tablet 81  mg  81 mg Oral BID Durene Romans, MD   81 mg at 04/02/23 2103   benzonatate (TESSALON) capsule 100 mg  100 mg Oral TID PRN Durene Romans, MD   100 mg at 03/29/23 1600   bisacodyl (DULCOLAX) suppository 10 mg  10 mg Rectal Daily PRN Durene Romans, MD       diphenhydrAMINE (BENADRYL) 12.5 MG/5ML elixir 12.5-25 mg  12.5-25 mg Oral Q4H PRN  Durene Romans, MD       docusate sodium (COLACE) capsule 100 mg  100 mg Oral BID Durene Romans, MD   100 mg at 04/02/23 2104   feeding supplement (ENSURE ENLIVE / ENSURE PLUS) liquid 237 mL  237 mL Oral BID BM Alwyn Ren, MD       gabapentin (NEURONTIN) capsule 600 mg  600 mg Oral TID Durene Romans, MD   600 mg at 04/02/23 2103   menthol-cetylpyridinium (CEPACOL) lozenge 3 mg  1 lozenge Oral PRN Durene Romans, MD       Or   phenol (CHLORASEPTIC) mouth spray 1 spray  1 spray Mouth/Throat PRN Durene Romans, MD       methocarbamol (ROBAXIN) tablet 500 mg  500 mg Oral Q6H PRN Durene Romans, MD   500 mg at 04/03/23 4098   Or   methocarbamol (ROBAXIN) 500 mg in dextrose 5 % 50 mL IVPB  500 mg Intravenous Q6H PRN Durene Romans, MD       metoCLOPramide (REGLAN) tablet 5-10 mg  5-10 mg Oral Q8H PRN Durene Romans, MD       Or   metoCLOPramide (REGLAN) injection 5-10 mg  5-10 mg Intravenous Q8H PRN Durene Romans, MD       morphine (PF) 2 MG/ML injection 0.5-1 mg  0.5-1 mg Intravenous Q2H PRN Durene Romans, MD   1 mg at 04/02/23 1519   multivitamin with minerals tablet 1 tablet  1 tablet Oral Daily Durene Romans, MD   1 tablet at 04/02/23 1191   mupirocin ointment (BACTROBAN) 2 % 1 Application  1 Application Nasal BID Durene Romans, MD   1 Application at 04/02/23 2104   nicotine (NICODERM CQ - dosed in mg/24 hours) patch 14 mg  14 mg Transdermal Daily Durene Romans, MD   14 mg at 04/02/23 0942   ondansetron (ZOFRAN) tablet 4 mg  4 mg Oral Q6H PRN Durene Romans, MD       Or   ondansetron [facility]) injection 4 mg  4 mg Intravenous Q6H PRN Durene Romans, MD       pantoprazole (PROTONIX) EC tablet 40 mg  40 mg Oral Daily Durene Romans, MD   40 mg at 04/02/23 0847   polyethylene glycol (MIRALAX / GLYCOLAX) packet 17 g  17 g Oral BID Durene Romans, MD   17 g at 04/02/23 0847   protein supplement (ENSURE MAX) liquid  11 oz Oral Daily Durene Romans, MD   11 oz at 04/02/23 0849   rosuvastatin (CRESTOR)  tablet 20 mg  20 mg Oral Daily Durene Romans, MD   20 mg at 04/02/23 4782   sertraline (ZOLOFT) tablet 100 mg  100 mg Oral Daily Durene Romans, MD   100 mg at 04/02/23 0849   traMADol (ULTRAM) tablet 50 mg  50 mg Oral Q6H PRN Cassandria Anger, PA-C   50 mg at 04/03/23 0617   umeclidinium bromide (INCRUSE ELLIPTA) 62.5 MCG/ACT 1 puff  1 puff Inhalation Daily Durene Romans, MD   1 puff at 04/03/23 [phone]  Discharge Medications: Please see discharge summary for a list of discharge medications.  Relevant Imaging Results:  Relevant Lab Results:   Additional Information SSN [id number]  Otelia Santee, LCSW

## 2023-04-04 DIAGNOSIS — R2681 Unsteadiness on feet: Secondary | ICD-10-CM | POA: Diagnosis not present

## 2023-04-04 DIAGNOSIS — Z96649 Presence of unspecified artificial hip joint: Secondary | ICD-10-CM | POA: Diagnosis not present

## 2023-04-04 DIAGNOSIS — K759 Inflammatory liver disease, unspecified: Secondary | ICD-10-CM | POA: Diagnosis not present

## 2023-04-04 DIAGNOSIS — Z7401 Bed confinement status: Secondary | ICD-10-CM | POA: Diagnosis not present

## 2023-04-04 DIAGNOSIS — R51 Headache with orthostatic component, not elsewhere classified: Secondary | ICD-10-CM | POA: Diagnosis not present

## 2023-04-04 DIAGNOSIS — M978XXD Periprosthetic fracture around other internal prosthetic joint, subsequent encounter: Secondary | ICD-10-CM | POA: Diagnosis not present

## 2023-04-04 DIAGNOSIS — G9009 Other idiopathic peripheral autonomic neuropathy: Secondary | ICD-10-CM | POA: Diagnosis not present

## 2023-04-04 DIAGNOSIS — E785 Hyperlipidemia, unspecified: Secondary | ICD-10-CM | POA: Diagnosis not present

## 2023-04-04 DIAGNOSIS — R41841 Cognitive communication deficit: Secondary | ICD-10-CM | POA: Diagnosis not present

## 2023-04-04 DIAGNOSIS — M6259 Muscle wasting and atrophy, not elsewhere classified, multiple sites: Secondary | ICD-10-CM | POA: Diagnosis not present

## 2023-04-04 DIAGNOSIS — D649 Anemia, unspecified: Secondary | ICD-10-CM | POA: Diagnosis not present

## 2023-04-04 DIAGNOSIS — K219 Gastro-esophageal reflux disease without esophagitis: Secondary | ICD-10-CM | POA: Diagnosis not present

## 2023-04-04 DIAGNOSIS — M978XXA Periprosthetic fracture around other internal prosthetic joint, initial encounter: Secondary | ICD-10-CM | POA: Diagnosis not present

## 2023-04-04 DIAGNOSIS — R531 Weakness: Secondary | ICD-10-CM | POA: Diagnosis not present

## 2023-04-04 DIAGNOSIS — S72009D Fracture of unspecified part of neck of unspecified femur, subsequent encounter for closed fracture with routine healing: Secondary | ICD-10-CM | POA: Diagnosis not present

## 2023-04-04 DIAGNOSIS — R945 Abnormal results of liver function studies: Secondary | ICD-10-CM | POA: Diagnosis not present

## 2023-04-04 DIAGNOSIS — Z4789 Encounter for other orthopedic aftercare: Secondary | ICD-10-CM | POA: Diagnosis not present

## 2023-04-04 DIAGNOSIS — I10 Essential (primary) hypertension: Secondary | ICD-10-CM | POA: Diagnosis not present

## 2023-04-04 DIAGNOSIS — F3289 Other specified depressive episodes: Secondary | ICD-10-CM | POA: Diagnosis not present

## 2023-04-04 DIAGNOSIS — N401 Enlarged prostate with lower urinary tract symptoms: Secondary | ICD-10-CM | POA: Diagnosis not present

## 2023-04-04 DIAGNOSIS — E559 Vitamin D deficiency, unspecified: Secondary | ICD-10-CM | POA: Diagnosis not present

## 2023-04-04 DIAGNOSIS — F17210 Nicotine dependence, cigarettes, uncomplicated: Secondary | ICD-10-CM | POA: Diagnosis not present

## 2023-04-04 DIAGNOSIS — Z5189 Encounter for other specified aftercare: Secondary | ICD-10-CM | POA: Diagnosis not present

## 2023-04-04 DIAGNOSIS — D72819 Decreased white blood cell count, unspecified: Secondary | ICD-10-CM | POA: Diagnosis not present

## 2023-04-04 DIAGNOSIS — S72001S Fracture of unspecified part of neck of right femur, sequela: Secondary | ICD-10-CM | POA: Diagnosis not present

## 2023-04-04 DIAGNOSIS — J449 Chronic obstructive pulmonary disease, unspecified: Secondary | ICD-10-CM | POA: Diagnosis not present

## 2023-04-04 NOTE — Plan of Care (Signed)
Patient is awake, alert orient x4 Remains on RA. Stable for discharge to SNF per MD order. Report given to Adrian Prows, LPN at [facility]. Patient left unit via stretcher with PTAR. No signs or symptoms of acute distress at the time of discharge. Problem: Education: Goal: Knowledge of General Education information will improve Description: Including pain rating scale, medication(s)/side effects and non-pharmacologic comfort measures Outcome: Adequate for Discharge   Problem: Health Behavior/Discharge Planning: Goal: Ability to manage health-related needs will improve Outcome: Adequate for Discharge   Problem: Clinical Measurements: Goal: Ability to maintain clinical measurements within normal limits will improve Outcome: Adequate for Discharge Goal: Will remain free from infection Outcome: Adequate for Discharge Goal: Diagnostic test results will improve Outcome: Adequate for Discharge Goal: Respiratory complications will improve Outcome: Adequate for Discharge Goal: Cardiovascular complication will be avoided Outcome: Adequate for Discharge   Problem: Activity: Goal: Risk for activity intolerance will decrease Outcome: Adequate for Discharge   Problem: Nutrition: Goal: Adequate nutrition will be maintained Outcome: Adequate for Discharge   Problem: Coping: Goal: Level of anxiety will decrease Outcome: Adequate for Discharge   Problem: Elimination: Goal: Will not experience complications related to bowel motility Outcome: Adequate for Discharge Goal: Will not experience complications related to urinary retention Outcome: Adequate for Discharge   Problem: Pain Managment: Goal: General experience of comfort will improve Outcome: Adequate for Discharge   Problem: Safety: Goal: Ability to remain free from injury will improve Outcome: Adequate for Discharge   Problem: Skin Integrity: Goal: Risk for impaired skin integrity will decrease Outcome: Adequate for  Discharge   Problem: Education: Goal: Knowledge of the prescribed therapeutic regimen will improve Outcome: Adequate for Discharge Goal: Understanding of discharge needs will improve Outcome: Adequate for Discharge Goal: Individualized Educational Video(s) Outcome: Adequate for Discharge   Problem: Activity: Goal: Ability to avoid complications of mobility impairment will improve Outcome: Adequate for Discharge Goal: Ability to tolerate increased activity will improve Outcome: Adequate for Discharge   Problem: Clinical Measurements: Goal: Postoperative complications will be avoided or minimized Outcome: Adequate for Discharge   Problem: Pain Management: Goal: Pain level will decrease with appropriate interventions Outcome: Adequate for Discharge   Problem: Skin Integrity: Goal: Will show signs of wound healing Outcome: Adequate for Discharge
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
## 2023-04-04 NOTE — TOC Transition Note (Signed)
Transition of Care [facility]) - CM/SW Discharge Note   Patient Details  Name: Taylor Ochoa MRN: [id number] Date of Birth: [birth year]  Transition of Care [facility]) CM/SW Contact:  Otelia Santee, LCSW Phone Number: 04/04/2023, 11:18 AM   Clinical Narrative:    Pt's insurance approved for SNF from 7/11 to 7/15. Auth ID: [id number] Pt is able to transfer to [facility] for SNF today. Pt will be going to room 506.  RN to call report to [phone]. Spoke with pt and spouse to inform of discharge. Both agreeable to plan. Pt will be transported to facility via [facility]. [facility] called at 11:22am.    Final next level of care: Skilled Nursing Facility Barriers to Discharge: No Barriers Identified   Patient Goals and CMS Choice CMS Medicare.gov Compare Post Acute Care list provided to:: Patient Choice offered to / list presented to : Patient, Spouse  Discharge Placement     Existing PASRR number confirmed : 04/03/23          Patient chooses bed at: [facility] [place] Patient to be transferred to facility by: [facility] Name of family member notified: Jotham Fickett Patient and family notified of of transfer: 04/04/23  Discharge Plan and Services Additional resources added to the After Visit Summary for     Discharge Planning Services: CM Consult Post Acute Care Choice: Home Health          DME Arranged: N/A         HH Arranged: PT HH Agency: Other - See comment ([facility]) Date HH Agency Contacted: 04/02/23 Time HH Agency Contacted: 1300 Representative spoke with at [facility] Agency: Rhunette Croft  Social Determinants of Health (SDOH) Interventions SDOH Screenings   Food Insecurity: No Food Insecurity (03/27/2023)  Housing: Low Risk  (03/27/2023)  Transportation Needs: No Transportation Needs (03/27/2023)  Utilities: Not At Risk (03/27/2023)  Alcohol Screen: Low Risk  (09/25/2022)  Depression (PHQ2-9): Medium Risk (12/18/2022)  Financial Resource Strain: Low Risk  (09/25/2022)  Physical  Activity: Sufficiently Active (09/25/2022)  Social Connections: Unknown (09/25/2022)  Stress: No Stress Concern Present (09/25/2022)  Tobacco Use: High Risk (03/31/2023)     Readmission Risk Interventions    04/04/2023   11:17 AM 07/18/2022    3:57 PM  Readmission Risk Prevention Plan  Post Dischage Appt  Complete  Medication Screening  Complete  Transportation Screening Complete Complete  PCP or Specialist Appt within 5-7 Days Complete   Home Care Screening Complete   Medication Review (RN CM) Complete
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
## 2023-04-10 DIAGNOSIS — S72001S Fracture of unspecified part of neck of right femur, sequela: Secondary | ICD-10-CM | POA: Diagnosis not present

## 2023-04-10 DIAGNOSIS — D72819 Decreased white blood cell count, unspecified: Secondary | ICD-10-CM | POA: Diagnosis not present

## 2023-04-10 DIAGNOSIS — R945 Abnormal results of liver function studies: Secondary | ICD-10-CM | POA: Diagnosis not present

## 2023-04-14 DIAGNOSIS — S72001S Fracture of unspecified part of neck of right femur, sequela: Secondary | ICD-10-CM | POA: Diagnosis not present

## 2023-04-14 DIAGNOSIS — F17210 Nicotine dependence, cigarettes, uncomplicated: Secondary | ICD-10-CM | POA: Diagnosis not present

## 2023-04-14 DIAGNOSIS — J449 Chronic obstructive pulmonary disease, unspecified: Secondary | ICD-10-CM | POA: Diagnosis not present

## 2023-04-16 ENCOUNTER — Ambulatory Visit: Payer: Medicare Other

## 2023-04-17 ENCOUNTER — Other Ambulatory Visit (HOSPITAL_COMMUNITY): Payer: Self-pay

## 2023-04-17 DIAGNOSIS — R945 Abnormal results of liver function studies: Secondary | ICD-10-CM | POA: Diagnosis not present

## 2023-04-17 DIAGNOSIS — F17210 Nicotine dependence, cigarettes, uncomplicated: Secondary | ICD-10-CM | POA: Diagnosis not present

## 2023-04-17 DIAGNOSIS — J449 Chronic obstructive pulmonary disease, unspecified: Secondary | ICD-10-CM | POA: Diagnosis not present

## 2023-04-17 DIAGNOSIS — Z5189 Encounter for other specified aftercare: Secondary | ICD-10-CM | POA: Diagnosis not present

## 2023-04-17 DIAGNOSIS — S72001S Fracture of unspecified part of neck of right femur, sequela: Secondary | ICD-10-CM | POA: Diagnosis not present

## 2023-04-18 ENCOUNTER — Telehealth: Payer: Self-pay | Admitting: Internal Medicine

## 2023-04-18 NOTE — Telephone Encounter (Signed)
Kasie with [facility] called and asked if Dr.Jones could do an order for OT and PT evaluation and treat. Patient is already scheduled for PT on the 29th and OT on the 30th. Best callback number is [phone], fax number is [phone].

## 2023-04-21 ENCOUNTER — Telehealth: Payer: Self-pay

## 2023-04-21 ENCOUNTER — Telehealth: Payer: Self-pay | Admitting: Internal Medicine

## 2023-04-21 DIAGNOSIS — D649 Anemia, unspecified: Secondary | ICD-10-CM | POA: Diagnosis not present

## 2023-04-21 DIAGNOSIS — S72301D Unspecified fracture of shaft of right femur, subsequent encounter for closed fracture with routine healing: Secondary | ICD-10-CM | POA: Diagnosis not present

## 2023-04-21 DIAGNOSIS — Z9181 History of falling: Secondary | ICD-10-CM | POA: Diagnosis not present

## 2023-04-21 DIAGNOSIS — J449 Chronic obstructive pulmonary disease, unspecified: Secondary | ICD-10-CM | POA: Diagnosis not present

## 2023-04-21 DIAGNOSIS — E785 Hyperlipidemia, unspecified: Secondary | ICD-10-CM | POA: Diagnosis not present

## 2023-04-21 DIAGNOSIS — F32A Depression, unspecified: Secondary | ICD-10-CM | POA: Diagnosis not present

## 2023-04-21 DIAGNOSIS — K219 Gastro-esophageal reflux disease without esophagitis: Secondary | ICD-10-CM | POA: Diagnosis not present

## 2023-04-21 DIAGNOSIS — M9701XD Periprosthetic fracture around internal prosthetic right hip joint, subsequent encounter: Secondary | ICD-10-CM | POA: Diagnosis not present

## 2023-04-21 DIAGNOSIS — F17210 Nicotine dependence, cigarettes, uncomplicated: Secondary | ICD-10-CM | POA: Diagnosis not present

## 2023-04-21 DIAGNOSIS — Z7982 Long term (current) use of aspirin: Secondary | ICD-10-CM | POA: Diagnosis not present

## 2023-04-21 DIAGNOSIS — F419 Anxiety disorder, unspecified: Secondary | ICD-10-CM | POA: Diagnosis not present

## 2023-04-21 NOTE — Transitions of Care (Post Inpatient/ED Visit) (Unsigned)
   04/21/2023  Name: Taylor Ochoa MRN: [id number] DOB: 1956/07/24  Today's TOC FU Call Status: Today's TOC FU Call Status:: Unsuccessul Call (1st Attempt) Unsuccessful Call (1st Attempt) Date: 04/21/23  Attempted to reach the patient regarding the most recent Inpatient/ED visit.  Follow Up Plan: Additional outreach attempts will be made to reach the patient to complete the Transitions of Care (Post Inpatient/ED visit) call.   Signature   Woodfin Ganja LPN [facility] Nurse Health Advisor Direct Dial [phone]

## 2023-04-21 NOTE — Telephone Encounter (Signed)
Verbal orders given to [facility] as requested

## 2023-04-21 NOTE — Telephone Encounter (Signed)
Caller & What Company:  [facility] from [facility]   Phone Number: [phone]   Needs Verbal orders for what service & frequency:Physical therapy 1 time a week for 7 weeks.

## 2023-04-22 ENCOUNTER — Telehealth: Payer: Self-pay | Admitting: Internal Medicine

## 2023-04-22 ENCOUNTER — Other Ambulatory Visit: Payer: Self-pay | Admitting: Internal Medicine

## 2023-04-22 DIAGNOSIS — Z9181 History of falling: Secondary | ICD-10-CM | POA: Diagnosis not present

## 2023-04-22 DIAGNOSIS — F419 Anxiety disorder, unspecified: Secondary | ICD-10-CM | POA: Diagnosis not present

## 2023-04-22 DIAGNOSIS — S72301D Unspecified fracture of shaft of right femur, subsequent encounter for closed fracture with routine healing: Secondary | ICD-10-CM | POA: Diagnosis not present

## 2023-04-22 DIAGNOSIS — J449 Chronic obstructive pulmonary disease, unspecified: Secondary | ICD-10-CM | POA: Diagnosis not present

## 2023-04-22 DIAGNOSIS — F32A Depression, unspecified: Secondary | ICD-10-CM | POA: Diagnosis not present

## 2023-04-22 DIAGNOSIS — Z7982 Long term (current) use of aspirin: Secondary | ICD-10-CM | POA: Diagnosis not present

## 2023-04-22 DIAGNOSIS — D649 Anemia, unspecified: Secondary | ICD-10-CM | POA: Diagnosis not present

## 2023-04-22 DIAGNOSIS — M978XXD Periprosthetic fracture around other internal prosthetic joint, subsequent encounter: Secondary | ICD-10-CM

## 2023-04-22 DIAGNOSIS — Z96649 Presence of unspecified artificial hip joint: Secondary | ICD-10-CM

## 2023-04-22 DIAGNOSIS — K219 Gastro-esophageal reflux disease without esophagitis: Secondary | ICD-10-CM | POA: Diagnosis not present

## 2023-04-22 DIAGNOSIS — M9701XD Periprosthetic fracture around internal prosthetic right hip joint, subsequent encounter: Secondary | ICD-10-CM | POA: Diagnosis not present

## 2023-04-22 DIAGNOSIS — E785 Hyperlipidemia, unspecified: Secondary | ICD-10-CM | POA: Diagnosis not present

## 2023-04-22 DIAGNOSIS — S72001A Fracture of unspecified part of neck of right femur, initial encounter for closed fracture: Secondary | ICD-10-CM

## 2023-04-22 DIAGNOSIS — G9389 Other specified disorders of brain: Secondary | ICD-10-CM

## 2023-04-22 DIAGNOSIS — F17210 Nicotine dependence, cigarettes, uncomplicated: Secondary | ICD-10-CM | POA: Diagnosis not present

## 2023-04-22 NOTE — Telephone Encounter (Signed)
HH ORDERS   Caller Name: [facility] Agency Name: [facility] Callback Phone #: [phone](secure)  Service Requested: OT (examples: OT/PT/Skilled Nursing/Social Work/Speech Therapy/Wound Care)  Frequency of Visits: 1X a week for 6 weeks

## 2023-04-22 NOTE — Telephone Encounter (Signed)
Verbal orders given to [facility] as requested.

## 2023-04-22 NOTE — Transitions of Care (Post Inpatient/ED Visit) (Signed)
04/22/2023  Name: Taylor Ochoa MRN: [id number] DOB: 1956/03/10  Today's TOC FU Call Status: Today's TOC FU Call Status:: Successful TOC FU Call Competed Unsuccessful Call (1st Attempt) Date: 04/21/23 [facility] FU Call Complete Date: 04/22/23  Transition Care Management Follow-up Telephone Call Date of Discharge: 04/20/23 Discharge Facility: Other ([facility]) Name of Other ([facility]) Discharge Facility: [facility] Type of Discharge: Inpatient Admission Primary Inpatient Discharge Diagnosis:: hip fx How have you been since you were released from the hospital?: Better Any questions or concerns?: No  Items Reviewed: Did you receive and understand the discharge instructions provided?: Yes Medications obtained,verified, and reconciled?: Yes (Medications Reviewed) Any new allergies since your discharge?: No Dietary orders reviewed?: Yes Do you have support at home?: Yes  Medications Reviewed Today: Medications Reviewed Today     Reviewed by Merleen Nicely, LPN (Licensed Practical Nurse) on 04/22/23 at 0912  Med List Status: <None>   Medication Order Taking? Sig Documenting Provider Last Dose Status Informant  aspirin 81 MG chewable tablet [id number] Yes Chew 1 tablet (81 mg total) by mouth 2 (two) times daily for 28 days. Cassandria Anger, PA-C Taking Active   bisacodyl (DULCOLAX) 10 MG suppository [id number] No Place 1 suppository (10 mg total) rectally daily as needed for moderate constipation.  Patient not taking: Reported on 04/22/2023   Alwyn Ren, MD Not Taking Active   Cholecalciferol 50 MCG ([phone] UT) TABS [id number] Yes Take 1 tablet (2,000 Units total) by mouth daily. Etta Grandchild, MD Taking Active Self  docusate sodium (COLACE) 100 MG capsule [id number] No Take 1 capsule (100 mg total) by mouth 2 (two) times daily as directed  Patient not taking: Reported on 04/22/2023   Alwyn Ren, MD Not Taking Active   feeding supplement (ENSURE  ENLIVE / ENSURE PLUS) LIQD [id number] No Take 237 mLs by mouth 2 (two) times daily between meals.  Patient not taking: Reported on 04/22/2023   Alwyn Ren, MD Not Taking Active   gabapentin (NEURONTIN) 600 MG tablet [id number] Yes Take 1 tablet (600 mg total) by mouth 3 (three) times daily. Etta Grandchild, MD Taking Active Self  hydrOXYzine (ATARAX) 10 MG tablet [id number] No Take 1 tablet (10 mg total) by mouth every 8 (eight) hours as needed.  Patient not taking: Reported on 04/22/2023   Etta Grandchild, MD Not Taking Active Self  methocarbamol (ROBAXIN) 500 MG tablet [id number] Yes Take 1 tablet (500 mg total) by mouth every 6 (six) hours as needed for muscle spasms. Cassandria Anger, PA-C Taking Active   Multiple Vitamin (MULTIVITAMIN WITH MINERALS) TABS tablet [id number] No Take 1 tablet by mouth daily.  Patient not taking: Reported on 04/22/2023   Alwyn Ren, MD Not Taking Active   pantoprazole (PROTONIX) 40 MG tablet [id number] Yes Take 1 tablet (40 mg total) by mouth daily. Etta Grandchild, MD Taking Active Self  polyethylene glycol (MIRALAX / GLYCOLAX) 17 g packet [id number] No Take 17 g by mouth 2 (two) times daily.  Patient not taking: Reported on 04/22/2023   Cassandria Anger, PA-C Not Taking Active   rosuvastatin (CRESTOR) 20 MG tablet [id number] Yes Take 1 tablet (20 mg total) by mouth daily. Etta Grandchild, MD Taking Active Self  sertraline (ZOLOFT) 100 MG tablet [id number] Yes Take 1 tablet (100 mg total) by mouth daily. Etta Grandchild, MD Taking Active Self  thiamine (VITAMIN B1) 100 MG tablet [id number] Yes Take 1 tablet (100 mg total) by  mouth daily. Etta Grandchild, MD Taking Active Self  Tiotropium Bromide Monohydrate (SPIRIVA RESPIMAT) 1.25 MCG/ACT AERS [id number] Yes Inhale 2 puffs into the lungs daily. Etta Grandchild, MD Taking Active Self  traMADol Janean Sark) 50 MG tablet [id number] Yes Take 1 tablet (50 mg total) by mouth every 6 (six) hours as needed for severe  pain. Cassandria Anger, PA-C Taking Active             Home Care and Equipment/Supplies: Were Home Health Services Ordered?: Yes Name of Home Health Agency:: [facility] Has Agency set up a time to come to your home?: Yes First Home Health Visit Date: 04/21/23 Any new equipment or medical supplies ordered?: No  Functional Questionnaire: Do you need assistance with bathing/showering or dressing?: Yes Do you need assistance with meal preparation?: Yes Do you need assistance with eating?: No Do you have difficulty maintaining continence: No Do you need assistance with getting out of bed/getting out of a chair/moving?: Yes Do you have difficulty managing or taking your medications?: Yes (wife assists)  Follow up appointments reviewed: PCP Follow-up appointment confirmed?: Yes Date of PCP follow-up appointment?: 05/14/23 (pt wife was unable to bring in the 14 day window due to working 2 jobs - was offered several appts) Follow-up Provider: Dr Yetta Barre [facility] Follow-up appointment confirmed?: Yes Date of Specialist follow-up appointment?: 05/21/23 Follow-Up Specialty Provider:: Dr Ashley Royalty Do you need transportation to your follow-up appointment?: No Do you understand care options if your condition(s) worsen?: Yes-patient verbalized understanding    SIGNATURE  Woodfin Ganja LPN [facility] Nurse Health Advisor Direct Dial [phone]

## 2023-04-23 ENCOUNTER — Encounter (INDEPENDENT_AMBULATORY_CARE_PROVIDER_SITE_OTHER): Payer: Self-pay

## 2023-04-28 DIAGNOSIS — K219 Gastro-esophageal reflux disease without esophagitis: Secondary | ICD-10-CM | POA: Diagnosis not present

## 2023-04-28 DIAGNOSIS — F419 Anxiety disorder, unspecified: Secondary | ICD-10-CM | POA: Diagnosis not present

## 2023-04-28 DIAGNOSIS — E785 Hyperlipidemia, unspecified: Secondary | ICD-10-CM | POA: Diagnosis not present

## 2023-04-28 DIAGNOSIS — S72301D Unspecified fracture of shaft of right femur, subsequent encounter for closed fracture with routine healing: Secondary | ICD-10-CM | POA: Diagnosis not present

## 2023-04-28 DIAGNOSIS — F32A Depression, unspecified: Secondary | ICD-10-CM | POA: Diagnosis not present

## 2023-04-28 DIAGNOSIS — F17210 Nicotine dependence, cigarettes, uncomplicated: Secondary | ICD-10-CM | POA: Diagnosis not present

## 2023-04-28 DIAGNOSIS — J449 Chronic obstructive pulmonary disease, unspecified: Secondary | ICD-10-CM | POA: Diagnosis not present

## 2023-04-28 DIAGNOSIS — Z7982 Long term (current) use of aspirin: Secondary | ICD-10-CM | POA: Diagnosis not present

## 2023-04-28 DIAGNOSIS — Z9181 History of falling: Secondary | ICD-10-CM | POA: Diagnosis not present

## 2023-04-28 DIAGNOSIS — D649 Anemia, unspecified: Secondary | ICD-10-CM | POA: Diagnosis not present

## 2023-04-28 DIAGNOSIS — M9701XD Periprosthetic fracture around internal prosthetic right hip joint, subsequent encounter: Secondary | ICD-10-CM | POA: Diagnosis not present

## 2023-04-29 DIAGNOSIS — F17210 Nicotine dependence, cigarettes, uncomplicated: Secondary | ICD-10-CM | POA: Diagnosis not present

## 2023-04-29 DIAGNOSIS — E785 Hyperlipidemia, unspecified: Secondary | ICD-10-CM | POA: Diagnosis not present

## 2023-04-29 DIAGNOSIS — Z9181 History of falling: Secondary | ICD-10-CM | POA: Diagnosis not present

## 2023-04-29 DIAGNOSIS — K219 Gastro-esophageal reflux disease without esophagitis: Secondary | ICD-10-CM | POA: Diagnosis not present

## 2023-04-29 DIAGNOSIS — D649 Anemia, unspecified: Secondary | ICD-10-CM | POA: Diagnosis not present

## 2023-04-29 DIAGNOSIS — F32A Depression, unspecified: Secondary | ICD-10-CM | POA: Diagnosis not present

## 2023-04-29 DIAGNOSIS — S72301D Unspecified fracture of shaft of right femur, subsequent encounter for closed fracture with routine healing: Secondary | ICD-10-CM | POA: Diagnosis not present

## 2023-04-29 DIAGNOSIS — J449 Chronic obstructive pulmonary disease, unspecified: Secondary | ICD-10-CM | POA: Diagnosis not present

## 2023-04-29 DIAGNOSIS — Z7982 Long term (current) use of aspirin: Secondary | ICD-10-CM | POA: Diagnosis not present

## 2023-04-29 DIAGNOSIS — F419 Anxiety disorder, unspecified: Secondary | ICD-10-CM | POA: Diagnosis not present

## 2023-04-29 DIAGNOSIS — M9701XD Periprosthetic fracture around internal prosthetic right hip joint, subsequent encounter: Secondary | ICD-10-CM | POA: Diagnosis not present

## 2023-05-05 DIAGNOSIS — J449 Chronic obstructive pulmonary disease, unspecified: Secondary | ICD-10-CM | POA: Diagnosis not present

## 2023-05-05 DIAGNOSIS — F419 Anxiety disorder, unspecified: Secondary | ICD-10-CM | POA: Diagnosis not present

## 2023-05-05 DIAGNOSIS — S72301D Unspecified fracture of shaft of right femur, subsequent encounter for closed fracture with routine healing: Secondary | ICD-10-CM | POA: Diagnosis not present

## 2023-05-05 DIAGNOSIS — D649 Anemia, unspecified: Secondary | ICD-10-CM | POA: Diagnosis not present

## 2023-05-05 DIAGNOSIS — Z9181 History of falling: Secondary | ICD-10-CM | POA: Diagnosis not present

## 2023-05-05 DIAGNOSIS — E785 Hyperlipidemia, unspecified: Secondary | ICD-10-CM | POA: Diagnosis not present

## 2023-05-05 DIAGNOSIS — M9701XD Periprosthetic fracture around internal prosthetic right hip joint, subsequent encounter: Secondary | ICD-10-CM | POA: Diagnosis not present

## 2023-05-05 DIAGNOSIS — Z7982 Long term (current) use of aspirin: Secondary | ICD-10-CM | POA: Diagnosis not present

## 2023-05-05 DIAGNOSIS — F32A Depression, unspecified: Secondary | ICD-10-CM | POA: Diagnosis not present

## 2023-05-05 DIAGNOSIS — K219 Gastro-esophageal reflux disease without esophagitis: Secondary | ICD-10-CM | POA: Diagnosis not present

## 2023-05-05 DIAGNOSIS — F17210 Nicotine dependence, cigarettes, uncomplicated: Secondary | ICD-10-CM | POA: Diagnosis not present

## 2023-05-07 ENCOUNTER — Encounter: Payer: Self-pay | Admitting: Podiatry

## 2023-05-07 ENCOUNTER — Ambulatory Visit: Payer: Medicare Other | Admitting: Podiatry

## 2023-05-07 DIAGNOSIS — B070 Plantar wart: Secondary | ICD-10-CM | POA: Diagnosis not present

## 2023-05-07 DIAGNOSIS — M79674 Pain in right toe(s): Secondary | ICD-10-CM | POA: Diagnosis not present

## 2023-05-07 DIAGNOSIS — M79675 Pain in left toe(s): Secondary | ICD-10-CM | POA: Diagnosis not present

## 2023-05-07 DIAGNOSIS — B351 Tinea unguium: Secondary | ICD-10-CM | POA: Diagnosis not present

## 2023-05-07 NOTE — Progress Notes (Signed)
  Subjective:  Patient ID: Taylor Ochoa, male    DOB: 08-May-1956,  MRN: [id number]  Chief Complaint  Patient presents with   Nail Problem    "Cut my toenails.  Remove that skin on my heel and that wart."    67 y.o. male presents with the above complaint. History confirmed with patient.  His nails are significantly thickened elongated and causing quite a bit of pain.  He is unable to cut them.  The warts continue to be bothersome, he was not able to return for immediate treatment due to recurrent hip injury.  Objective:  Physical Exam: warm, good capillary refill, no trophic changes or ulcerative lesions, normal DP and PT pulses, and normal sensory exam.  Multiple left foot painful hyperkeratotic lesions noted, benign-appearing in nature, tender to touch, lesions present on lateral fifth toe, submetatarsal 1, posterior lateral heel Left Foot: dystrophic yellowed discolored nail plates with subungual debris Right Foot: dystrophic yellowed discolored nail plates with subungual debris  Assessment:   1. Verruca plantaris   2. Pain due to onychomycosis of toenails of both feet      Plan:  Patient was evaluated and treated and all questions answered.  Discussed the etiology and treatment options for the condition in detail with the patient. Educated patient on the topical and oral treatment options for mycotic nails. Recommended debridement of the nails today. Sharp and mechanical debridement performed of all painful and mycotic nails today. Nails debrided in length and thickness using a nail nipper to level of comfort. Discussed treatment options including appropriate shoe gear. Follow up as needed for painful nails.  Discussed etiology and treatment of the skin lesions in detail with the patient as well as multiple treatment options including blistering agents, chemotherapeutic agents, surgical excision, laser therapy and the indications and roles of the above.  Today, recommended treatment  with salicylic acid as noted in procedure note below.  Recommend he use salicylic acid at home, I recommended he look for this on [employer]  Procedure: Destruction of Lesion Location: Left foot Instrumentation: 15 blade. Technique: Debridement of lesion to petechial bleeding. Aperture pad applied around lesion. Small amount of salinocaine applied to the base of the lesion. Dressing: Dry, sterile, compression dressing. Disposition: Patient tolerated procedure well. Advised to leave dressing on for 12 hours. Thereafter patient to wash the area with soap and water and applied band-aid.    Return in about 1 month (around 06/07/2023) for painful thick fungal nails, wart treatment.

## 2023-05-08 DIAGNOSIS — M9701XD Periprosthetic fracture around internal prosthetic right hip joint, subsequent encounter: Secondary | ICD-10-CM | POA: Diagnosis not present

## 2023-05-08 DIAGNOSIS — J449 Chronic obstructive pulmonary disease, unspecified: Secondary | ICD-10-CM | POA: Diagnosis not present

## 2023-05-08 DIAGNOSIS — S72301D Unspecified fracture of shaft of right femur, subsequent encounter for closed fracture with routine healing: Secondary | ICD-10-CM | POA: Diagnosis not present

## 2023-05-08 DIAGNOSIS — F32A Depression, unspecified: Secondary | ICD-10-CM | POA: Diagnosis not present

## 2023-05-08 DIAGNOSIS — K219 Gastro-esophageal reflux disease without esophagitis: Secondary | ICD-10-CM | POA: Diagnosis not present

## 2023-05-08 DIAGNOSIS — F17210 Nicotine dependence, cigarettes, uncomplicated: Secondary | ICD-10-CM | POA: Diagnosis not present

## 2023-05-08 DIAGNOSIS — D649 Anemia, unspecified: Secondary | ICD-10-CM | POA: Diagnosis not present

## 2023-05-08 DIAGNOSIS — E785 Hyperlipidemia, unspecified: Secondary | ICD-10-CM | POA: Diagnosis not present

## 2023-05-08 DIAGNOSIS — Z9181 History of falling: Secondary | ICD-10-CM | POA: Diagnosis not present

## 2023-05-08 DIAGNOSIS — Z7982 Long term (current) use of aspirin: Secondary | ICD-10-CM | POA: Diagnosis not present

## 2023-05-08 DIAGNOSIS — F419 Anxiety disorder, unspecified: Secondary | ICD-10-CM | POA: Diagnosis not present

## 2023-05-13 ENCOUNTER — Ambulatory Visit: Payer: Medicare Other | Admitting: Internal Medicine

## 2023-05-14 ENCOUNTER — Other Ambulatory Visit (HOSPITAL_COMMUNITY): Payer: Self-pay

## 2023-05-14 ENCOUNTER — Other Ambulatory Visit: Payer: Self-pay

## 2023-05-14 ENCOUNTER — Telehealth: Payer: Self-pay | Admitting: Internal Medicine

## 2023-05-14 ENCOUNTER — Ambulatory Visit (INDEPENDENT_AMBULATORY_CARE_PROVIDER_SITE_OTHER): Payer: Medicare Other | Admitting: Internal Medicine

## 2023-05-14 ENCOUNTER — Encounter: Payer: Self-pay | Admitting: Internal Medicine

## 2023-05-14 VITALS — BP 122/60 | HR 67 | Temp 98.5°F | Ht 67.0 in | Wt 151.0 lb

## 2023-05-14 DIAGNOSIS — J411 Mucopurulent chronic bronchitis: Secondary | ICD-10-CM

## 2023-05-14 DIAGNOSIS — M8000XG Age-related osteoporosis with current pathological fracture, unspecified site, subsequent encounter for fracture with delayed healing: Secondary | ICD-10-CM

## 2023-05-14 DIAGNOSIS — D510 Vitamin B12 deficiency anemia due to intrinsic factor deficiency: Secondary | ICD-10-CM

## 2023-05-14 DIAGNOSIS — J449 Chronic obstructive pulmonary disease, unspecified: Secondary | ICD-10-CM | POA: Diagnosis not present

## 2023-05-14 DIAGNOSIS — D649 Anemia, unspecified: Secondary | ICD-10-CM | POA: Diagnosis not present

## 2023-05-14 DIAGNOSIS — M9701XD Periprosthetic fracture around internal prosthetic right hip joint, subsequent encounter: Secondary | ICD-10-CM | POA: Diagnosis not present

## 2023-05-14 DIAGNOSIS — F32A Depression, unspecified: Secondary | ICD-10-CM | POA: Diagnosis not present

## 2023-05-14 DIAGNOSIS — Z7982 Long term (current) use of aspirin: Secondary | ICD-10-CM | POA: Diagnosis not present

## 2023-05-14 DIAGNOSIS — S72301D Unspecified fracture of shaft of right femur, subsequent encounter for closed fracture with routine healing: Secondary | ICD-10-CM | POA: Diagnosis not present

## 2023-05-14 DIAGNOSIS — F419 Anxiety disorder, unspecified: Secondary | ICD-10-CM | POA: Diagnosis not present

## 2023-05-14 DIAGNOSIS — E785 Hyperlipidemia, unspecified: Secondary | ICD-10-CM | POA: Diagnosis not present

## 2023-05-14 DIAGNOSIS — F17210 Nicotine dependence, cigarettes, uncomplicated: Secondary | ICD-10-CM | POA: Diagnosis not present

## 2023-05-14 DIAGNOSIS — Z720 Tobacco use: Secondary | ICD-10-CM

## 2023-05-14 DIAGNOSIS — Z9181 History of falling: Secondary | ICD-10-CM | POA: Diagnosis not present

## 2023-05-14 DIAGNOSIS — K219 Gastro-esophageal reflux disease without esophagitis: Secondary | ICD-10-CM | POA: Diagnosis not present

## 2023-05-14 MED ORDER — DENOSUMAB 60 MG/ML ~~LOC~~ SOSY
60.0000 mg | PREFILLED_SYRINGE | Freq: Once | SUBCUTANEOUS | 0 refills | Status: DC
Start: 2023-05-14 — End: 2023-05-15
  Filled 2023-05-14: qty 1, 1d supply, fill #0
  Filled 2023-05-15: qty 1, 180d supply, fill #0

## 2023-05-14 NOTE — Patient Instructions (Signed)
Vitamin B12 Deficiency Vitamin B12 deficiency means that your body does not have enough vitamin B12. The body needs this important vitamin: To make red blood cells. To make genes (DNA). To help the nerves work. If you do not have enough vitamin B12 in your body, you can have health problems, such as not having enough red blood cells in the blood (anemia). What are the causes? Not eating enough foods that contain vitamin B12. Not being able to take in (absorb) vitamin B12 from the food that you eat. Certain diseases. A condition in which the body does not make enough of a certain protein. This results in your body not taking in enough vitamin B12. Having a surgery in which part of the stomach or small intestine is taken out. Taking medicines that make it hard for the body to take in vitamin B12. These include: Heartburn medicines. Some medicines that are used to treat diabetes. What increases the risk? Being an older adult. Eating a vegetarian or vegan diet that does not include any foods that come from animals. Not eating enough foods that contain vitamin B12 while you are pregnant. Taking certain medicines. Having alcoholism. What are the signs or symptoms? In some cases, there are no symptoms. If the condition leads to too few blood cells or nerve damage, symptoms can occur, such as: Feeling weak or tired. Not being hungry. Losing feeling (numbness) or tingling in your hands and feet. Redness and burning of the tongue. Feeling sad (depressed). Confusion or memory problems. Trouble walking. If anemia is very bad, symptoms can include: Being short of breath. Being dizzy. Having a very fast heartbeat. How is this treated? Changing the way you eat and drink, such as: Eating more foods that contain vitamin B12. Drinking little or no alcohol. Getting vitamin B12 shots. Taking vitamin B12 supplements by mouth (orally). Your doctor will tell you the dose that is best for you. Follow  these instructions at home: Eating and drinking  Eat foods that come from animals and have a lot of vitamin B12 in them. These include: Meats and poultry. This includes beef, pork, chicken, turkey, and organ meats, such as liver. Seafood, such as clams, rainbow trout, salmon, tuna, and haddock. Eggs. Dairy foods such as milk, yogurt, and cheese. Eat breakfast cereals that have vitamin B12 added to them (are fortified). Check the label. The items listed above may not be a complete list of foods and beverages you can eat and drink. Contact a dietitian for more information. Alcohol use Do not drink alcohol if: Your doctor tells you not to drink. You are pregnant, may be pregnant, or are planning to become pregnant. If you drink alcohol: Limit how much you have to: 0-1 drink a day for women. 0-2 drinks a day for men. Know how much alcohol is in your drink. In the [place] one drink equals one 12 oz bottle of beer (355 mL), one 5 oz glass of wine (148 mL), or one 1 oz glass of hard liquor (44 mL). General instructions Get any vitamin B12 shots if told by your doctor. Take supplements only as told by your doctor. Follow the directions. Keep all follow-up visits. Contact a doctor if: Your symptoms come back. Your symptoms get worse or do not get better with treatment. Get help right away if: You have trouble breathing. You have a very fast heartbeat. You have chest pain. You get dizzy. You faint. These symptoms may be an emergency. Get help right away. Call 911.   Do not wait to see if the symptoms will go away. Do not drive yourself to the hospital. Summary Vitamin B12 deficiency means that your body is not getting enough of the vitamin. In some cases, there are no symptoms of this condition. Treatment may include making a change in the way you eat and drink, getting shots, or taking supplements. Eat foods that have vitamin B12 in them. This information is not intended to replace advice  given to you by your health care provider. Make sure you discuss any questions you have with your health care provider. Document Revised: 05/04/2021 Document Reviewed: 05/04/2021 Elsevier Patient Education  2024 Elsevier Inc.  

## 2023-05-14 NOTE — Progress Notes (Unsigned)
Subjective:  Patient ID: Taylor Ochoa, male    DOB: September 30, 1955  Age: 67 y.o. MRN: [id number]  CC: COPD and Anemia   HPI Taylor Ochoa presents for f/up ----  Discussed the use of AI scribe software for clinical note transcription with the patient, who gave verbal consent to proceed.  History of Present Illness   The patient has been diagnosed with osteoporosis and is considering treatment options. They have agreed to start a biannual injection regimen with Prolia. Additionally, the patient has noticed a growth on their finger, which they identify as similar to a wart previously experienced on their foot. The patient is seeking clarification and treatment for this new growth.       Outpatient Medications Prior to Visit  Medication Sig Dispense Refill   bisacodyl (DULCOLAX) 10 MG suppository Place 1 suppository (10 mg total) rectally daily as needed for moderate constipation. 12 suppository 0   Cholecalciferol 50 MCG (2000 UT) TABS Take 1 tablet (2,000 Units total) by mouth daily. 90 tablet 1   docusate sodium (COLACE) 100 MG capsule Take 1 capsule (100 mg total) by mouth 2 (two) times daily as directed 100 capsule 0   feeding supplement (ENSURE ENLIVE / ENSURE PLUS) LIQD Take 237 mLs by mouth 2 (two) times daily between meals. 237 mL 12   gabapentin (NEURONTIN) 600 MG tablet Take 1 tablet (600 mg total) by mouth 3 (three) times daily. 270 tablet 0   hydrOXYzine (ATARAX) 10 MG tablet Take 1 tablet (10 mg total) by mouth every 8 (eight) hours as needed. 30 tablet 0   methocarbamol (ROBAXIN) 500 MG tablet Take 1 tablet (500 mg total) by mouth every 6 (six) hours as needed for muscle spasms. 40 tablet 0   Multiple Vitamin (MULTIVITAMIN WITH MINERALS) TABS tablet Take 1 tablet by mouth daily.     pantoprazole (PROTONIX) 40 MG tablet Take 1 tablet (40 mg total) by mouth daily. 90 tablet 0   polyethylene glycol (MIRALAX / GLYCOLAX) 17 g packet Take 17 g by mouth 2 (two) times daily. 14 each 0    rosuvastatin (CRESTOR) 20 MG tablet Take 1 tablet (20 mg total) by mouth daily. 90 tablet 1   sertraline (ZOLOFT) 100 MG tablet Take 1 tablet (100 mg total) by mouth daily. 90 tablet 0   thiamine (VITAMIN B1) 100 MG tablet Take 1 tablet (100 mg total) by mouth daily. 90 tablet 0   Tiotropium Bromide Monohydrate (SPIRIVA RESPIMAT) 1.25 MCG/ACT AERS Inhale 2 puffs into the lungs daily. 4 g 5   traMADol (ULTRAM) 50 MG tablet Take 1 tablet (50 mg total) by mouth every 6 (six) hours as needed for severe pain. 30 tablet 0   No facility-administered medications prior to visit.    ROS Review of Systems  Objective:  BP 122/60 (BP Location: Left Arm, Patient Position: Sitting, Cuff Size: Normal)   Pulse 67   Temp 98.5 F (36.9 C) (Oral)   Ht 5\' 7"  (1.702 m)   Wt 151 lb (68.5 kg)   SpO2 96%   BMI 23.65 kg/m   BP Readings from Last 3 Encounters:  05/14/23 122/60  04/04/23 124/81  03/26/23 110/72    Wt Readings from Last 3 Encounters:  05/14/23 151 lb (68.5 kg)  03/31/23 154 lb 5.2 oz (70 kg)  03/26/23 154 lb 5.2 oz (70 kg)    Physical Exam  Lab Results  Component Value Date   WBC 12.8 (H) 04/02/2023   HGB 10.9 (  L) 04/02/2023   HCT 32.5 (L) 04/02/2023   PLT 263 04/02/2023   GLUCOSE 168 (H) 04/01/2023   CHOL 124 03/19/2023   TRIG 104.0 03/19/2023   HDL 45.80 03/19/2023   LDLCALC 58 03/19/2023   ALT 14 03/27/2023   AST 18 03/27/2023   NA 135 04/01/2023   K 4.4 04/01/2023   CL 101 04/01/2023   CREATININE 1.10 04/01/2023   BUN 22 04/01/2023   CO2 25 04/01/2023   TSH 1.019 03/27/2023   PSA 3.13 03/19/2023   INR 1.0 03/27/2023   HGBA1C 6.1 03/19/2023    DG CHEST PORT 1 VIEW  Result Date: 03/26/2023 CLINICAL DATA:  Preop hip fracture. EXAM: PORTABLE CHEST 1 VIEW COMPARISON:  Chest radiograph dated July 17, 2022 FINDINGS: The heart is mildly enlarged. Both lungs are clear. The visualized skeletal structures are unremarkable. IMPRESSION: No active disease. Mild  cardiomegaly. Electronically Signed   By: Larose Hires D.O.   On: 03/26/2023 23:46   CT Hip Right Wo Contrast  Result Date: 03/26/2023 CLINICAL DATA:  Fall, right hip fracture EXAM: CT OF THE RIGHT HIP WITHOUT CONTRAST TECHNIQUE: Multidetector CT imaging of the right hip was performed according to the standard protocol. Multiplanar CT image reconstructions were also generated. RADIATION DOSE REDUCTION: This exam was performed according to the departmental dose-optimization program which includes automated exposure control, adjustment of the mA and/or kV according to patient size and/or use of iterative reconstruction technique. COMPARISON:  Right hip radiographs dated 03/26/2023 and 07/16/2022 FINDINGS: Right hip arthroplasty. Associated mildly displaced fracture along the greater trochanter (series 2/image 17) with a nondisplaced fracture extending along the anterior shaft of the proximal femur at the level of the mid prosthesis (series 2/image 303). Old right superior and bilateral inferior pubic rami fractures. Status post ORIF of the right sacroiliac joint. No evidence of hardware complication. Visualized soft tissue are grossly unremarkable. IMPRESSION: Right hip arthroplasty. Associated periprosthetic fractures involving the greater trochanter and anterior proximal femoral shaft, as described above. Electronically Signed   By: Charline Bills M.D.   On: 03/26/2023 19:07   DG Hip Unilat W or Wo Pelvis 2-3 Views Right  Result Date: 03/26/2023 CLINICAL DATA:  Trauma, fall, pain EXAM: DG HIP (WITH OR WITHOUT PELVIS) 2-3V RIGHT COMPARISON:  Previous studies including the examination of 07/16/2022 FINDINGS: There is previous right hip arthroplasty. There are multiple surgical screws in pelvis including sacrum. There is new break in the cortical margin in the upper and lateral margins of greater trochanter of right femur. Deformities in both pubic bones have not changed significantly. IMPRESSION: Previous  right hip arthroplasty. There is new recent fracture in the greater trochanter of proximal right femur. Electronically Signed   By: Ernie Avena M.D.   On: 03/26/2023 18:03    Assessment & Plan:  Age-related osteoporosis with current pathological fracture with delayed healing, subsequent encounter -     Denosumab; Inject 60 mg into the skin once for 1 dose.  Dispense: 1 mL; Refill: 0     Follow-up: No follow-ups on file.  Sanda Linger, MD

## 2023-05-15 ENCOUNTER — Other Ambulatory Visit (HOSPITAL_COMMUNITY): Payer: Self-pay

## 2023-05-15 ENCOUNTER — Other Ambulatory Visit: Payer: Self-pay

## 2023-05-15 MED ORDER — DENOSUMAB 60 MG/ML ~~LOC~~ SOSY
60.0000 mg | PREFILLED_SYRINGE | Freq: Once | SUBCUTANEOUS | 0 refills | Status: AC
Start: 2023-05-15 — End: 2023-05-15

## 2023-05-15 MED ORDER — CYANOCOBALAMIN 2000 MCG PO TABS
2000.0000 ug | ORAL_TABLET | Freq: Every day | ORAL | 1 refills | Status: DC
Start: 2023-05-15 — End: 2024-06-09
  Filled 2023-05-15: qty 90, 90d supply, fill #0

## 2023-05-16 ENCOUNTER — Other Ambulatory Visit (HOSPITAL_COMMUNITY): Payer: Self-pay

## 2023-05-16 DIAGNOSIS — F419 Anxiety disorder, unspecified: Secondary | ICD-10-CM | POA: Diagnosis not present

## 2023-05-16 DIAGNOSIS — F17210 Nicotine dependence, cigarettes, uncomplicated: Secondary | ICD-10-CM | POA: Diagnosis not present

## 2023-05-16 DIAGNOSIS — M9701XD Periprosthetic fracture around internal prosthetic right hip joint, subsequent encounter: Secondary | ICD-10-CM | POA: Diagnosis not present

## 2023-05-16 DIAGNOSIS — K219 Gastro-esophageal reflux disease without esophagitis: Secondary | ICD-10-CM | POA: Diagnosis not present

## 2023-05-16 DIAGNOSIS — F32A Depression, unspecified: Secondary | ICD-10-CM | POA: Diagnosis not present

## 2023-05-16 DIAGNOSIS — Z7982 Long term (current) use of aspirin: Secondary | ICD-10-CM | POA: Diagnosis not present

## 2023-05-16 DIAGNOSIS — J449 Chronic obstructive pulmonary disease, unspecified: Secondary | ICD-10-CM | POA: Diagnosis not present

## 2023-05-16 DIAGNOSIS — E785 Hyperlipidemia, unspecified: Secondary | ICD-10-CM | POA: Diagnosis not present

## 2023-05-16 DIAGNOSIS — Z9181 History of falling: Secondary | ICD-10-CM | POA: Diagnosis not present

## 2023-05-16 DIAGNOSIS — S72301D Unspecified fracture of shaft of right femur, subsequent encounter for closed fracture with routine healing: Secondary | ICD-10-CM | POA: Diagnosis not present

## 2023-05-16 DIAGNOSIS — D649 Anemia, unspecified: Secondary | ICD-10-CM | POA: Diagnosis not present

## 2023-05-19 ENCOUNTER — Other Ambulatory Visit: Payer: Self-pay

## 2023-05-21 DIAGNOSIS — Z5189 Encounter for other specified aftercare: Secondary | ICD-10-CM | POA: Diagnosis not present

## 2023-05-22 ENCOUNTER — Other Ambulatory Visit: Payer: Self-pay

## 2023-05-22 DIAGNOSIS — K219 Gastro-esophageal reflux disease without esophagitis: Secondary | ICD-10-CM | POA: Diagnosis not present

## 2023-05-22 DIAGNOSIS — Z9181 History of falling: Secondary | ICD-10-CM | POA: Diagnosis not present

## 2023-05-22 DIAGNOSIS — F17210 Nicotine dependence, cigarettes, uncomplicated: Secondary | ICD-10-CM | POA: Diagnosis not present

## 2023-05-22 DIAGNOSIS — M9701XD Periprosthetic fracture around internal prosthetic right hip joint, subsequent encounter: Secondary | ICD-10-CM | POA: Diagnosis not present

## 2023-05-22 DIAGNOSIS — D649 Anemia, unspecified: Secondary | ICD-10-CM | POA: Diagnosis not present

## 2023-05-22 DIAGNOSIS — Z7982 Long term (current) use of aspirin: Secondary | ICD-10-CM | POA: Diagnosis not present

## 2023-05-22 DIAGNOSIS — F32A Depression, unspecified: Secondary | ICD-10-CM | POA: Diagnosis not present

## 2023-05-22 DIAGNOSIS — F419 Anxiety disorder, unspecified: Secondary | ICD-10-CM | POA: Diagnosis not present

## 2023-05-22 DIAGNOSIS — J449 Chronic obstructive pulmonary disease, unspecified: Secondary | ICD-10-CM | POA: Diagnosis not present

## 2023-05-22 DIAGNOSIS — S72301D Unspecified fracture of shaft of right femur, subsequent encounter for closed fracture with routine healing: Secondary | ICD-10-CM | POA: Diagnosis not present

## 2023-05-22 DIAGNOSIS — E785 Hyperlipidemia, unspecified: Secondary | ICD-10-CM | POA: Diagnosis not present

## 2023-05-23 ENCOUNTER — Other Ambulatory Visit: Payer: Self-pay

## 2023-05-27 DIAGNOSIS — F17210 Nicotine dependence, cigarettes, uncomplicated: Secondary | ICD-10-CM | POA: Diagnosis not present

## 2023-05-27 DIAGNOSIS — F419 Anxiety disorder, unspecified: Secondary | ICD-10-CM | POA: Diagnosis not present

## 2023-05-27 DIAGNOSIS — M9701XD Periprosthetic fracture around internal prosthetic right hip joint, subsequent encounter: Secondary | ICD-10-CM | POA: Diagnosis not present

## 2023-05-27 DIAGNOSIS — Z7982 Long term (current) use of aspirin: Secondary | ICD-10-CM | POA: Diagnosis not present

## 2023-05-27 DIAGNOSIS — F32A Depression, unspecified: Secondary | ICD-10-CM | POA: Diagnosis not present

## 2023-05-27 DIAGNOSIS — J449 Chronic obstructive pulmonary disease, unspecified: Secondary | ICD-10-CM | POA: Diagnosis not present

## 2023-05-27 DIAGNOSIS — Z9181 History of falling: Secondary | ICD-10-CM | POA: Diagnosis not present

## 2023-05-27 DIAGNOSIS — S72301D Unspecified fracture of shaft of right femur, subsequent encounter for closed fracture with routine healing: Secondary | ICD-10-CM | POA: Diagnosis not present

## 2023-05-27 DIAGNOSIS — E785 Hyperlipidemia, unspecified: Secondary | ICD-10-CM | POA: Diagnosis not present

## 2023-05-27 DIAGNOSIS — D649 Anemia, unspecified: Secondary | ICD-10-CM | POA: Diagnosis not present

## 2023-05-27 DIAGNOSIS — K219 Gastro-esophageal reflux disease without esophagitis: Secondary | ICD-10-CM | POA: Diagnosis not present

## 2023-06-02 DIAGNOSIS — Z7982 Long term (current) use of aspirin: Secondary | ICD-10-CM | POA: Diagnosis not present

## 2023-06-02 DIAGNOSIS — K219 Gastro-esophageal reflux disease without esophagitis: Secondary | ICD-10-CM | POA: Diagnosis not present

## 2023-06-02 DIAGNOSIS — F32A Depression, unspecified: Secondary | ICD-10-CM | POA: Diagnosis not present

## 2023-06-02 DIAGNOSIS — S72301D Unspecified fracture of shaft of right femur, subsequent encounter for closed fracture with routine healing: Secondary | ICD-10-CM | POA: Diagnosis not present

## 2023-06-02 DIAGNOSIS — M9701XD Periprosthetic fracture around internal prosthetic right hip joint, subsequent encounter: Secondary | ICD-10-CM | POA: Diagnosis not present

## 2023-06-02 DIAGNOSIS — F17210 Nicotine dependence, cigarettes, uncomplicated: Secondary | ICD-10-CM | POA: Diagnosis not present

## 2023-06-02 DIAGNOSIS — F419 Anxiety disorder, unspecified: Secondary | ICD-10-CM | POA: Diagnosis not present

## 2023-06-02 DIAGNOSIS — Z9181 History of falling: Secondary | ICD-10-CM | POA: Diagnosis not present

## 2023-06-02 DIAGNOSIS — D649 Anemia, unspecified: Secondary | ICD-10-CM | POA: Diagnosis not present

## 2023-06-02 DIAGNOSIS — J449 Chronic obstructive pulmonary disease, unspecified: Secondary | ICD-10-CM | POA: Diagnosis not present

## 2023-06-02 DIAGNOSIS — E785 Hyperlipidemia, unspecified: Secondary | ICD-10-CM | POA: Diagnosis not present

## 2023-06-09 ENCOUNTER — Ambulatory Visit: Payer: Medicare Other | Admitting: Podiatry

## 2023-06-09 ENCOUNTER — Encounter: Payer: Self-pay | Admitting: Podiatry

## 2023-06-09 VITALS — BP 122/71 | HR 63

## 2023-06-09 DIAGNOSIS — B070 Plantar wart: Secondary | ICD-10-CM

## 2023-06-09 NOTE — Progress Notes (Signed)
Subjective:  Patient ID: Taylor Ochoa, male    DOB: [birth year],  MRN: [id number]  Chief Complaint  Patient presents with   Plantar Warts    "It's gotten a little better.  I been digging it out."    67 y.o. male presents with the above complaint. History confirmed with patient.    Objective:  Physical Exam: warm, good capillary refill, no trophic changes or ulcerative lesions, normal DP and PT pulses, and normal sensory exam.  Multiple left foot painful hyperkeratotic lesions noted, benign-appearing in nature, tender to touch, lesions present on lateral fifth toe, submetatarsal 1, posterior lateral heel Left Foot: dystrophic yellowed discolored nail plates with subungual debris Right Foot: dystrophic yellowed discolored nail plates with subungual debris  Assessment:   1. Verruca plantaris       Plan:  Patient was evaluated and treated and all questions answered.    Discussed etiology and treatment of the skin lesions in detail with the patient as well as multiple treatment options including blistering agents, chemotherapeutic agents, surgical excision, laser therapy and the indications and roles of the above.  Today, recommended treatment with salicylic acid as noted in procedure note below.  Recommend he use salicylic acid at home, I recommended he look for this on [employer]  Procedure: Destruction of Lesion Location: Left foot Instrumentation: 15 blade. Technique: Debridement of lesion to petechial bleeding. Aperture pad applied around lesion. Small amount of salinocaine applied to the base of the lesion. Dressing: Dry, sterile, compression dressing. Disposition: Patient tolerated procedure well. Advised to leave dressing on for 24-48 hours. Thereafter patient to wash the area with soap and water and applied band-aid.    Return in about 1 month (around 07/09/2023) for wart treatment, painful thick fungal nails.

## 2023-06-12 ENCOUNTER — Encounter: Payer: Self-pay | Admitting: Emergency Medicine

## 2023-06-12 ENCOUNTER — Other Ambulatory Visit: Payer: Self-pay

## 2023-06-12 ENCOUNTER — Emergency Department
Admission: EM | Admit: 2023-06-12 | Discharge: 2023-06-12 | Disposition: A | Discharge status: Home / Self Care | Payer: Medicare Other | Attending: Emergency Medicine | Admitting: Emergency Medicine

## 2023-06-12 DIAGNOSIS — S40861A Insect bite (nonvenomous) of right upper arm, initial encounter: Secondary | ICD-10-CM | POA: Diagnosis not present

## 2023-06-12 DIAGNOSIS — S40862A Insect bite (nonvenomous) of left upper arm, initial encounter: Secondary | ICD-10-CM | POA: Diagnosis not present

## 2023-06-12 DIAGNOSIS — W57XXXA Bitten or stung by nonvenomous insect and other nonvenomous arthropods, initial encounter: Secondary | ICD-10-CM | POA: Diagnosis not present

## 2023-06-12 MED ORDER — DEXAMETHASONE 10 MG/ML FOR PEDIATRIC ORAL USE
10.0000 mg | Freq: Once | INTRAMUSCULAR | Status: AC
  Administered 2023-06-12: 10 mg via ORAL
  Filled 2023-06-12 (×2): qty 1

## 2023-06-12 NOTE — Discharge Instructions (Addendum)
We gave you a one-time dose of medication called Decadron, which is a steroid that should help with the rash and itching.  We recommend you take it daily tablet of cetirizine (Zyrtec) which is available over-the-counter and is an effective antihistamine.  You can use calamine lotion or other topical anti-itch lotion/cream/ointment as well.  Please follow-up with your regular doctor at the next available opportunity.  Return to the emergency department if you develop new or worsening symptoms that concern you.

## 2023-06-12 NOTE — ED Provider Notes (Signed)
[facility] Provider Note    Event Date/Time   First MD Initiated Contact with Patient 06/12/23 [phone]     (approximate)   History   Insect Bite   HPI Taylor Ochoa is a 67 y.o. male who presents for evaluation of an itchy raised red rash on both of his arms.  He states that they occurred because of ant bites.  It happened about 12 hours prior to his presentation in the emergency department.  He states that he [place] bus stop and sat right into a nest of ants which swarmed up onto his exposed arms and started biting him.  He said he was able to get the ants off but that the raised red rash and itching has gotten worse.  He took some Benadryl prior to coming to the emergency department.  The itching is very frustrating for him and he wanted to make sure he did not need any other medication.  The patient denies any difficulty breathing, feeling of full or closing throat, difficulty swallowing, nausea, vomiting, abdominal pain, vomiting, and diarrhea.  The issue seems to be isolated to his arms where he was bitten by the ants.       Physical Exam   Triage Vital Signs: ED Triage Vitals  Encounter Vitals Group     BP 06/12/23 0006 131/70     Systolic BP Percentile --      Diastolic BP Percentile --      Pulse Rate 06/12/23 0006 87     Resp 06/12/23 0006 18     Temp 06/12/23 0006 98 F (36.7 C)     Temp Source 06/12/23 0523 Oral     SpO2 06/12/23 0006 99 %     Weight 06/12/23 0005 68.5 kg (151 lb)     Height 06/12/23 0005 1.702 m (5\' 7" )     Head Circumference --      Peak Flow --      Pain Score 06/12/23 0008 5     Pain Loc --      Pain Education --      Exclude from Growth Chart --     Most recent vital signs: Vitals:   06/12/23 0523 06/12/23 0615  BP: 113/84 118/76  Pulse: 68 72  Resp:  20  Temp: 98.6 F (37 C) 98.3 F (36.8 C)  SpO2: 99% 100%    General: Awake, no obvious distress. CV:  Good peripheral perfusion.  Regular rate and  rhythm Resp:  Normal effort. Speaking easily and comfortably, no accessory muscle usage nor intercostal retractions.   Abd:  No distention.  Other:  Numerous discrete maculopapular lesions on both of his arms with minimal amount of surrounding erythema but there are enough of them that they almost coalesce.  They are not exactly urticarial but similar and are consistent with a history of localized allergic reaction to insect bites.  No pus, no bullae, no blisters or vesicles.   ED Results / Procedures / Treatments   Labs (all labs ordered are listed, but only abnormal results are displayed) Labs Reviewed - No data to display     PROCEDURES:  Critical Care performed: No  Procedures    IMPRESSION / MDM / ASSESSMENT AND PLAN / ED COURSE  I reviewed the triage vital signs and the nursing notes.  Differential diagnosis includes, but is not limited to, allergic reaction, insect bites, cellulitis, drug reaction, SJS, erythema nodosum, erythema migrans.  Patient's presentation is most consistent with acute, uncomplicated illness.  Interventions/Medications given:  Medications  dexamethasone (DECADRON) 10 MG/ML injection for Pediatric ORAL use 10 mg (10 mg Oral Given 06/12/23 0657)    (Note:  hospital course my include additional interventions and/or labs/studies not listed above.)   Stable, normal vital signs, observed in the emergency department for nearly 7 hours due to overwhelming patient volume and boarding.  No systemic signs of allergic reaction/anaphylaxis.  No evidence to support the idea of infectious process, and the patient has not recently started any new medications to suggest a drug reaction.  Symptoms started after having ants on his arms biting him.  I recommended cetirizine and over-the-counter ointment such as calamine lotion or hydrocortisone.  I gave him a dose of dexamethasone 10 mg p.o. in the emergency department which should also  help.  I encouraged close outpatient follow-up.  Patient is comfortable with the plan.  I gave strict return precautions.         FINAL CLINICAL IMPRESSION(S) / ED DIAGNOSES   Final diagnoses:  Multiple insect bites     Rx / DC Orders   ED Discharge Orders     None        Note:  This document was prepared using Dragon voice recognition software and may include unintentional dictation errors.   Loleta Rose, MD 06/12/23 1116

## 2023-06-12 NOTE — ED Triage Notes (Signed)
Pt presents ambulatory to triage via POV with complaints of ant bite that occurred earlier today and has hives on both arms that has gotten worse over the last 2 hours. Last took benadryl around 2200 last night. Airway patent. A&Ox4 at this time. Denies CP or SOB.

## 2023-06-14 ENCOUNTER — Encounter (HOSPITAL_COMMUNITY): Payer: Self-pay

## 2023-06-24 ENCOUNTER — Other Ambulatory Visit (HOSPITAL_COMMUNITY): Payer: Self-pay

## 2023-06-24 ENCOUNTER — Other Ambulatory Visit: Payer: Self-pay | Admitting: Internal Medicine

## 2023-06-24 DIAGNOSIS — F411 Generalized anxiety disorder: Secondary | ICD-10-CM

## 2023-06-25 ENCOUNTER — Other Ambulatory Visit (HOSPITAL_COMMUNITY): Payer: Self-pay

## 2023-06-25 ENCOUNTER — Ambulatory Visit: Payer: Medicare Other | Admitting: Internal Medicine

## 2023-06-25 ENCOUNTER — Other Ambulatory Visit: Payer: Self-pay

## 2023-06-25 ENCOUNTER — Encounter: Payer: Self-pay | Admitting: Internal Medicine

## 2023-06-25 VITALS — BP 114/66 | HR 75 | Temp 97.9°F | Ht 67.0 in | Wt 151.0 lb

## 2023-06-25 DIAGNOSIS — D510 Vitamin B12 deficiency anemia due to intrinsic factor deficiency: Secondary | ICD-10-CM | POA: Diagnosis not present

## 2023-06-25 DIAGNOSIS — F411 Generalized anxiety disorder: Secondary | ICD-10-CM

## 2023-06-25 DIAGNOSIS — K219 Gastro-esophageal reflux disease without esophagitis: Secondary | ICD-10-CM

## 2023-06-25 DIAGNOSIS — Z23 Encounter for immunization: Secondary | ICD-10-CM | POA: Diagnosis not present

## 2023-06-25 DIAGNOSIS — R7303 Prediabetes: Secondary | ICD-10-CM | POA: Diagnosis not present

## 2023-06-25 DIAGNOSIS — N1831 Chronic kidney disease, stage 3a: Secondary | ICD-10-CM | POA: Diagnosis not present

## 2023-06-25 DIAGNOSIS — K4030 Unilateral inguinal hernia, with obstruction, without gangrene, not specified as recurrent: Secondary | ICD-10-CM | POA: Insufficient documentation

## 2023-06-25 LAB — HEMOGLOBIN A1C: Hgb A1c MFr Bld: 6.2 % (ref 4.6–6.5)

## 2023-06-25 LAB — CBC WITH DIFFERENTIAL/PLATELET
Basophils Absolute: 0 10*3/uL (ref 0.0–0.1)
Basophils Relative: 0.3 % (ref 0.0–3.0)
Eosinophils Absolute: 0.1 10*3/uL (ref 0.0–0.7)
Eosinophils Relative: 1 % (ref 0.0–5.0)
HCT: 42.2 % (ref 39.0–52.0)
Hemoglobin: 13.9 g/dL (ref 13.0–17.0)
Lymphocytes Relative: 22.9 % (ref 12.0–46.0)
Lymphs Abs: 2.4 10*3/uL (ref 0.7–4.0)
MCHC: 33 g/dL (ref 30.0–36.0)
MCV: 94.3 fL (ref 78.0–100.0)
Monocytes Absolute: 0.6 10*3/uL (ref 0.1–1.0)
Monocytes Relative: 5.9 % (ref 3.0–12.0)
Neutro Abs: 7.2 10*3/uL (ref 1.4–7.7)
Neutrophils Relative %: 69.9 % (ref 43.0–77.0)
Platelets: 280 10*3/uL (ref 150.0–400.0)
RBC: 4.47 Mil/uL (ref 4.22–5.81)
RDW: 14.8 % (ref 11.5–15.5)
WBC: 10.3 10*3/uL (ref 4.0–10.5)

## 2023-06-25 LAB — BASIC METABOLIC PANEL
BUN: 16 mg/dL (ref 6–23)
CO2: 25 meq/L (ref 19–32)
Calcium: 9.6 mg/dL (ref 8.4–10.5)
Chloride: 107 meq/L (ref 96–112)
Creatinine, Ser: 1.17 mg/dL (ref 0.40–1.50)
GFR: 64.69 mL/min (ref >60.00)
Glucose, Bld: 105 mg/dL — ABNORMAL HIGH (ref 70–99)
Potassium: 4.6 meq/L (ref 3.5–5.1)
Sodium: 141 meq/L (ref 135–145)

## 2023-06-25 LAB — FOLATE: Folate: 11.7 ng/mL (ref >5.9)

## 2023-06-25 LAB — VITAMIN B12: Vitamin B-12: 428 pg/mL (ref 211–911)

## 2023-06-25 MED ORDER — SERTRALINE HCL 100 MG PO TABS
100.0000 mg | ORAL_TABLET | Freq: Every day | ORAL | 0 refills | Status: DC
  Filled 2023-06-25 (×2): qty 90, 90d supply, fill #0

## 2023-06-25 MED ORDER — PANTOPRAZOLE SODIUM 40 MG PO TBEC
40.0000 mg | DELAYED_RELEASE_TABLET | Freq: Every day | ORAL | 0 refills | Status: DC
  Filled 2023-06-25 (×2): qty 90, 90d supply, fill #0

## 2023-06-25 NOTE — Progress Notes (Signed)
Subjective:  Patient ID: CHANCELOR Ochoa, male    DOB: Jun 04, 1956  Age: 67 y.o. MRN: [id number]  CC: Anemia   HPI Taylor Ochoa presents for f/up ----  Discussed the use of AI scribe software for clinical note transcription with the patient, who gave verbal consent to proceed.  History of Present Illness   The patient presents with a progressively enlarging hernia, which, while not painful, is a cause for concern due to its increasing size. He denies any associated symptoms such as nausea, vomiting, constipation, diarrhea, chest pain, or shortness of breath. The patient also reports a history of B12 deficiency, with the last injection administered in the previous month. He is due for another B12 injection at the end of the current month.  In addition to the hernia and B12 deficiency, the patient has been identified with anemia, the status of which is to be rechecked through blood counts. He has also received a flu vaccine during the current visit.       Outpatient Medications Prior to Visit  Medication Sig Dispense Refill   bisacodyl (DULCOLAX) 10 MG suppository Place 1 suppository (10 mg total) rectally daily as needed for moderate constipation. 12 suppository 0   Cholecalciferol 50 MCG (2000 UT) TABS Take 1 tablet (2,000 Units total) by mouth daily. 90 tablet 1   cyanocobalamin 2000 MCG tablet Take 1 tablet (2,000 mcg total) by mouth daily. 90 tablet 1   docusate sodium (COLACE) 100 MG capsule Take 1 capsule (100 mg total) by mouth 2 (two) times daily as directed 100 capsule 0   feeding supplement (ENSURE ENLIVE / ENSURE PLUS) LIQD Take 237 mLs by mouth 2 (two) times daily between meals. 237 mL 12   gabapentin (NEURONTIN) 600 MG tablet Take 1 tablet (600 mg total) by mouth 3 (three) times daily. 270 tablet 0   hydrOXYzine (ATARAX) 10 MG tablet Take 1 tablet (10 mg total) by mouth every 8 (eight) hours as needed. 30 tablet 0   methocarbamol (ROBAXIN) 500 MG tablet Take 1 tablet (500 mg  total) by mouth every 6 (six) hours as needed for muscle spasms. 40 tablet 0   Multiple Vitamin (MULTIVITAMIN WITH MINERALS) TABS tablet Take 1 tablet by mouth daily.     polyethylene glycol (MIRALAX / GLYCOLAX) 17 g packet Take 17 g by mouth 2 (two) times daily. 14 each 0   rosuvastatin (CRESTOR) 20 MG tablet Take 1 tablet (20 mg total) by mouth daily. 90 tablet 1   thiamine (VITAMIN B1) 100 MG tablet Take 1 tablet (100 mg total) by mouth daily. 90 tablet 0   Tiotropium Bromide Monohydrate (SPIRIVA RESPIMAT) 1.25 MCG/ACT AERS Inhale 2 puffs into the lungs daily. 4 g 5   traMADol (ULTRAM) 50 MG tablet Take 1 tablet (50 mg total) by mouth every 6 (six) hours as needed for severe pain. 30 tablet 0   pantoprazole (PROTONIX) 40 MG tablet Take 1 tablet (40 mg total) by mouth daily. 90 tablet 0   sertraline (ZOLOFT) 100 MG tablet Take 1 tablet (100 mg total) by mouth daily. 90 tablet 0   No facility-administered medications prior to visit.    ROS Review of Systems  Constitutional:  Negative for appetite change, chills, diaphoresis and fatigue.  Eyes: Negative.   Respiratory:  Negative for cough, chest tightness, shortness of breath and wheezing.   Cardiovascular:  Negative for chest pain, palpitations and leg swelling.  Gastrointestinal:  Negative for abdominal pain, blood in stool, constipation, diarrhea,  nausea and vomiting.  Endocrine: Negative.   Genitourinary: Negative.  Negative for difficulty urinating, scrotal swelling and testicular pain.  Musculoskeletal:  Positive for arthralgias. Negative for back pain, gait problem, myalgias and neck pain.  Skin: Negative.  Negative for color change.  Neurological: Negative.  Negative for dizziness and weakness.  Hematological:  Negative for adenopathy. Does not bruise/bleed easily.  Psychiatric/Behavioral:  Negative for decreased concentration, dysphoric mood, hallucinations, self-injury and suicidal ideas. The patient is nervous/anxious.      Objective:  BP 114/66 (BP Location: Left Arm, Patient Position: Sitting, Cuff Size: Large)   Pulse 75   Temp 97.9 F (36.6 C) (Oral)   Ht 5\' 7"  (1.702 m)   Wt 151 lb (68.5 kg)   SpO2 94%   BMI 23.65 kg/m   BP Readings from Last 3 Encounters:  06/25/23 114/66  06/12/23 118/76  06/09/23 122/71    Wt Readings from Last 3 Encounters:  06/25/23 151 lb (68.5 kg)  06/12/23 151 lb (68.5 kg)  05/14/23 151 lb (68.5 kg)    Physical Exam Vitals reviewed.  Constitutional:      Appearance: Normal appearance.  HENT:     Nose: Nose normal.     Mouth/Throat:     Mouth: Mucous membranes are moist.  Eyes:     General: No scleral icterus.    Conjunctiva/sclera: Conjunctivae normal.  Cardiovascular:     Rate and Rhythm: Normal rate and regular rhythm.     Heart sounds: No murmur heard.    No friction rub. No gallop.  Pulmonary:     Effort: Pulmonary effort is normal.     Breath sounds: No stridor. No wheezing, rhonchi or rales.  Abdominal:     General: Abdomen is flat.     Palpations: There is no mass.     Tenderness: There is no abdominal tenderness. There is no guarding.     Hernia: No hernia is present.  Genitourinary:    Comments: Left fat-filled inguinal hernia that does not extend into the scrotum Musculoskeletal:        General: Normal range of motion.     Cervical back: Neck supple.     Right lower leg: No edema.     Left lower leg: No edema.  Lymphadenopathy:     Cervical: No cervical adenopathy.  Skin:    General: Skin is warm and dry.     Findings: No rash.  Neurological:     General: No focal deficit present.     Mental Status: He is alert. Mental status is at baseline.  Psychiatric:        Mood and Affect: Mood normal.        Behavior: Behavior normal.     Lab Results  Component Value Date   WBC 10.3 06/25/2023   HGB 13.9 06/25/2023   HCT 42.2 06/25/2023   PLT 280.0 06/25/2023   GLUCOSE 105 (H) 06/25/2023   CHOL 124 03/19/2023   TRIG 104.0  03/19/2023   HDL 45.80 03/19/2023   LDLCALC 58 03/19/2023   ALT 14 03/27/2023   AST 18 03/27/2023   NA 141 06/25/2023   K 4.6 06/25/2023   CL 107 06/25/2023   CREATININE 1.17 06/25/2023   BUN 16 06/25/2023   CO2 25 06/25/2023   TSH 1.019 03/27/2023   PSA 3.13 03/19/2023   INR 1.0 03/27/2023   HGBA1C 6.2 06/25/2023    No results found.  Assessment & Plan:   Gastroesophageal reflux disease without esophagitis- Will  continue the PPI -     Pantoprazole Sodium; Take 1 tablet (40 mg total) by mouth daily.  Dispense: 90 tablet; Refill: 0  GAD (generalized anxiety disorder) -     Sertraline HCl; Take 1 tablet (100 mg total) by mouth daily.  Dispense: 90 tablet; Refill: 0  Prediabetes -     Basic metabolic panel; Future -     Hemoglobin A1c; Future  Vitamin B12 deficiency anemia due to intrinsic factor deficiency- H/H are normal now. -     CBC with Differential/Platelet; Future -     Vitamin B12; Future -     Folate; Future  Stage 3a chronic kidney disease (HCC) -     Basic metabolic panel; Future  Irreducible left inguinal hernia -     Ambulatory referral to General Surgery  Flu vaccine need -     Flu Vaccine Trivalent High Dose (Fluad)     Follow-up: Return in about 4 months (around 10/26/2023).  Sanda Linger, MD

## 2023-06-25 NOTE — Patient Instructions (Signed)
Vitamin B12 Deficiency Vitamin B12 deficiency means that your body does not have enough vitamin B12. The body needs this important vitamin: To make red blood cells. To make genes (DNA). To help the nerves work. If you do not have enough vitamin B12 in your body, you can have health problems, such as not having enough red blood cells in the blood (anemia). What are the causes? Not eating enough foods that contain vitamin B12. Not being able to take in (absorb) vitamin B12 from the food that you eat. Certain diseases. A condition in which the body does not make enough of a certain protein. This results in your body not taking in enough vitamin B12. Having a surgery in which part of the stomach or small intestine is taken out. Taking medicines that make it hard for the body to take in vitamin B12. These include: Heartburn medicines. Some medicines that are used to treat diabetes. What increases the risk? Being an older adult. Eating a vegetarian or vegan diet that does not include any foods that come from animals. Not eating enough foods that contain vitamin B12 while you are pregnant. Taking certain medicines. Having alcoholism. What are the signs or symptoms? In some cases, there are no symptoms. If the condition leads to too few blood cells or nerve damage, symptoms can occur, such as: Feeling weak or tired. Not being hungry. Losing feeling (numbness) or tingling in your hands and feet. Redness and burning of the tongue. Feeling sad (depressed). Confusion or memory problems. Trouble walking. If anemia is very bad, symptoms can include: Being short of breath. Being dizzy. Having a very fast heartbeat. How is this treated? Changing the way you eat and drink, such as: Eating more foods that contain vitamin B12. Drinking little or no alcohol. Getting vitamin B12 shots. Taking vitamin B12 supplements by mouth (orally). Your doctor will tell you the dose that is best for you. Follow  these instructions at home: Eating and drinking  Eat foods that come from animals and have a lot of vitamin B12 in them. These include: Meats and poultry. This includes beef, pork, chicken, [place], and organ meats, such as liver. Seafood, such as clams, rainbow trout, salmon, tuna, and haddock. Eggs. Dairy foods such as milk, yogurt, and cheese. Eat breakfast cereals that have vitamin B12 added to them (are fortified). Check the label. The items listed above may not be a complete list of foods and beverages you can eat and drink. Contact a dietitian for more information. Alcohol use Do not drink alcohol if: Your doctor tells you not to drink. You are pregnant, may be pregnant, or are planning to become pregnant. If you drink alcohol: Limit how much you have to: 0-1 drink a day for women. 0-2 drinks a day for men. Know how much alcohol is in your drink. In the [place] one drink equals one 12 oz bottle of beer (355 mL), one 5 oz glass of wine (148 mL), or one 1 oz glass of hard liquor (44 mL). General instructions Get any vitamin B12 shots if told by your doctor. Take supplements only as told by your doctor. Follow the directions. Keep all follow-up visits. Contact a doctor if: Your symptoms come back. Your symptoms get worse or do not get better with treatment. Get help right away if: You have trouble breathing. You have a very fast heartbeat. You have chest pain. You get dizzy. You faint. These symptoms may be an emergency. Get help right away. Call 911.  Do not wait to see if the symptoms will go away. Do not drive yourself to the hospital. Summary Vitamin B12 deficiency means that your body is not getting enough of the vitamin. In some cases, there are no symptoms of this condition. Treatment may include making a change in the way you eat and drink, getting shots, or taking supplements. Eat foods that have vitamin B12 in them. This information is not intended to replace advice  given to you by your health care provider. Make sure you discuss any questions you have with your health care provider. Document Revised: 05/04/2021 Document Reviewed: 05/04/2021 Elsevier Patient Education  2024 [employer].

## 2023-07-09 ENCOUNTER — Ambulatory Visit: Payer: Medicare Other | Admitting: Podiatry

## 2023-07-16 ENCOUNTER — Telehealth: Payer: Self-pay

## 2023-07-16 ENCOUNTER — Ambulatory Visit: Payer: Medicare Other | Admitting: Podiatry

## 2023-07-16 ENCOUNTER — Encounter: Payer: Self-pay | Admitting: Podiatry

## 2023-07-16 VITALS — BP 135/77 | HR 66

## 2023-07-16 DIAGNOSIS — B070 Plantar wart: Secondary | ICD-10-CM

## 2023-07-16 NOTE — Telephone Encounter (Signed)
Transition Care Management Unsuccessful Follow-up Telephone Call  Date of discharge and from where:  [place] 9/19  Attempts:  1st Attempt  Reason for unsuccessful TCM follow-up call:  No answer/busy   Lenard Forth [place]  [facility], [facility] Guide, Phone: [phone] Website: [email or web address]

## 2023-07-16 NOTE — Progress Notes (Signed)
Subjective:  Patient ID: Taylor Ochoa, male    DOB: Jan 26, 1956,  MRN: [id number]  Chief Complaint  Patient presents with   Plantar Warts    "It's coming back again."   Nail Problem    "Toenails"    67 y.o. male presents with the above complaint. History confirmed with patient.    Objective:  Physical Exam: warm, good capillary refill, no trophic changes or ulcerative lesions, normal DP and PT pulses, and normal sensory exam.  Multiple left foot painful hyperkeratotic lesions noted, benign-appearing in nature, tender to touch, lesions present on lateral fifth toe, submetatarsal 1, posterior lateral heel Left Foot: dystrophic yellowed discolored nail plates with subungual debris Right Foot: dystrophic yellowed discolored nail plates with subungual debris  Assessment:   1. Verruca plantaris       Plan:  Patient was evaluated and treated and all questions answered.    Improving with treatments.  Today, recommended treatment with salicylic acid as noted in procedure note below.  Continue sal acid at home  Procedure: Destruction of Lesion Location: Left foot Instrumentation: 15 blade. Technique: Debridement of lesion to petechial bleeding. Aperture pad applied around lesion. Small amount of salinocaine applied to the base of the lesion. Dressing: Dry, sterile, compression dressing. Disposition: Patient tolerated procedure well. Advised to leave dressing on for 24-48 hours. Thereafter patient to wash the area with soap and water and applied band-aid.    Return in about 4 weeks (around 08/13/2023) for wart treatment.

## 2023-07-17 ENCOUNTER — Telehealth: Payer: Self-pay

## 2023-07-17 NOTE — Telephone Encounter (Signed)
Transition Care Management Unsuccessful Follow-up Telephone Call  Date of discharge and from where:  [place] 9/19  Attempts:  2nd Attempt  Reason for unsuccessful TCM follow-up call:  No answer/busy   Lenard Forth [place]  [facility], [facility] Guide, Phone: [phone] Website: [email or web address]

## 2023-07-28 ENCOUNTER — Encounter: Payer: Self-pay | Admitting: Family Medicine

## 2023-07-28 ENCOUNTER — Ambulatory Visit: Payer: Medicare Other | Admitting: Family Medicine

## 2023-07-28 ENCOUNTER — Other Ambulatory Visit (HOSPITAL_COMMUNITY): Payer: Self-pay

## 2023-07-28 VITALS — BP 90/52 | HR 62 | Temp 99.4°F | Resp 20 | Ht 67.0 in | Wt 147.0 lb

## 2023-07-28 DIAGNOSIS — R9389 Abnormal findings on diagnostic imaging of other specified body structures: Secondary | ICD-10-CM

## 2023-07-28 DIAGNOSIS — R1032 Left lower quadrant pain: Secondary | ICD-10-CM | POA: Diagnosis not present

## 2023-07-28 DIAGNOSIS — R112 Nausea with vomiting, unspecified: Secondary | ICD-10-CM

## 2023-07-28 DIAGNOSIS — R918 Other nonspecific abnormal finding of lung field: Secondary | ICD-10-CM

## 2023-07-28 DIAGNOSIS — R1011 Right upper quadrant pain: Secondary | ICD-10-CM | POA: Diagnosis not present

## 2023-07-28 DIAGNOSIS — R911 Solitary pulmonary nodule: Secondary | ICD-10-CM

## 2023-07-28 DIAGNOSIS — J189 Pneumonia, unspecified organism: Secondary | ICD-10-CM

## 2023-07-28 MED ORDER — ONDANSETRON 4 MG PO TBDP
4.0000 mg | ORAL_TABLET | Freq: Three times a day (TID) | ORAL | 0 refills | Status: DC | PRN
Start: 2023-07-28 — End: 2023-10-29
  Filled 2023-07-28: qty 20, 7d supply, fill #0

## 2023-07-28 NOTE — Progress Notes (Signed)
Assessment & Plan:  1-2. Left lower quadrant abdominal pain/RUQ pain - CT ABDOMEN PELVIS W CONTRAST; Future  3. Nausea and vomiting in adult - ondansetron (ZOFRAN-ODT) 4 MG disintegrating tablet; Take 1 tablet (4 mg total) by mouth every 8 (eight) hours as needed for nausea or vomiting.  Dispense: 20 tablet; Refill: 0   Follow up plan: Return if symptoms worsen or fail to improve.  Deliah Boston, MSN, APRN, FNP-C  Subjective:  HPI: MARKEESE BOYAJIAN is a 67 y.o. male presenting on 07/28/2023 for Abdominal Pain (With cramping and vomiting x 2 weeks - worse last 2-3 days /Has hernia - getting larger. /)  Patient is accompanied by his wife, who he is okay with being present.  Patient reports worsening abdominal pain that started two weeks ago accompanied by nausea and vomiting just about every time he eats. He describes the pain as cramping. Low grade fever of 99.4. Denies blood in vomit and diarrhea. Pepto bismol is slightly helpful.     ROS: Negative unless specifically indicated above in HPI.   Relevant past medical history reviewed and updated as indicated.   Allergies and medications reviewed and updated.   Current Outpatient Medications:    Cholecalciferol 50 MCG (2000 UT) TABS, Take 1 tablet (2,000 Units total) by mouth daily., Disp: 90 tablet, Rfl: 1   pantoprazole (PROTONIX) 40 MG tablet, Take 1 tablet (40 mg total) by mouth daily., Disp: 90 tablet, Rfl: 0   rosuvastatin (CRESTOR) 20 MG tablet, Take 1 tablet (20 mg total) by mouth daily., Disp: 90 tablet, Rfl: 1   sertraline (ZOLOFT) 100 MG tablet, Take 1 tablet (100 mg total) by mouth daily., Disp: 90 tablet, Rfl: 0   Tiotropium Bromide Monohydrate (SPIRIVA RESPIMAT) 1.25 MCG/ACT AERS, Inhale 2 puffs into the lungs daily., Disp: 4 g, Rfl: 5   bisacodyl (DULCOLAX) 10 MG suppository, Place 1 suppository (10 mg total) rectally daily as needed for moderate constipation. (Patient not taking: Reported on 07/28/2023), Disp: 12  suppository, Rfl: 0   cyanocobalamin 2000 MCG tablet, Take 1 tablet (2,000 mcg total) by mouth daily. (Patient not taking: Reported on 07/28/2023), Disp: 90 tablet, Rfl: 1   docusate sodium (COLACE) 100 MG capsule, Take 1 capsule (100 mg total) by mouth 2 (two) times daily as directed (Patient not taking: Reported on 07/28/2023), Disp: 100 capsule, Rfl: 0   feeding supplement (ENSURE ENLIVE / ENSURE PLUS) LIQD, Take 237 mLs by mouth 2 (two) times daily between meals. (Patient not taking: Reported on 07/28/2023), Disp: 237 mL, Rfl: 12   gabapentin (NEURONTIN) 600 MG tablet, Take 1 tablet (600 mg total) by mouth 3 (three) times daily. (Patient not taking: Reported on 07/28/2023), Disp: 270 tablet, Rfl: 0   hydrOXYzine (ATARAX) 10 MG tablet, Take 1 tablet (10 mg total) by mouth every 8 (eight) hours as needed. (Patient not taking: Reported on 07/28/2023), Disp: 30 tablet, Rfl: 0   methocarbamol (ROBAXIN) 500 MG tablet, Take 1 tablet (500 mg total) by mouth every 6 (six) hours as needed for muscle spasms. (Patient not taking: Reported on 07/28/2023), Disp: 40 tablet, Rfl: 0   Multiple Vitamin (MULTIVITAMIN WITH MINERALS) TABS tablet, Take 1 tablet by mouth daily. (Patient not taking: Reported on 07/28/2023), Disp: , Rfl:    polyethylene glycol (MIRALAX / GLYCOLAX) 17 g packet, Take 17 g by mouth 2 (two) times daily. (Patient not taking: Reported on 07/28/2023), Disp: 14 each, Rfl: 0   thiamine (VITAMIN B1) 100 MG tablet, Take 1 tablet (  100 mg total) by mouth daily. (Patient not taking: Reported on 07/28/2023), Disp: 90 tablet, Rfl: 0   traMADol (ULTRAM) 50 MG tablet, Take 1 tablet (50 mg total) by mouth every 6 (six) hours as needed for severe pain. (Patient not taking: Reported on 07/28/2023), Disp: 30 tablet, Rfl: 0  No Known Allergies  Objective:   BP (!) 90/52   Pulse 62   Temp 99.4 F (37.4 C)   Resp 20   Ht 5\' 7"  (1.702 m)   Wt 147 lb (66.7 kg)   SpO2 93%   BMI 23.02 kg/m    Physical Exam Vitals  reviewed.  Constitutional:      General: He is not in acute distress.    Appearance: Normal appearance. He is not ill-appearing, toxic-appearing or diaphoretic.  HENT:     Head: Normocephalic and atraumatic.  Eyes:     General: No scleral icterus.       Right eye: No discharge.        Left eye: No discharge.     Conjunctiva/sclera: Conjunctivae normal.  Cardiovascular:     Rate and Rhythm: Normal rate.  Pulmonary:     Effort: Pulmonary effort is normal. No respiratory distress.  Abdominal:     General: Abdomen is flat.     Palpations: There is no hepatomegaly or splenomegaly.     Tenderness: There is abdominal tenderness in the right upper quadrant and left lower quadrant. Negative signs include Murphy's sign.  Musculoskeletal:        General: Normal range of motion.     Cervical back: Normal range of motion.  Skin:    General: Skin is warm and dry.  Neurological:     Mental Status: He is alert and oriented to person, place, and time. Mental status is at baseline.  Psychiatric:        Mood and Affect: Mood normal.        Behavior: Behavior normal.        Thought Content: Thought content normal.        Judgment: Judgment normal.

## 2023-07-29 ENCOUNTER — Ambulatory Visit
Admission: RE | Admit: 2023-07-29 | Discharge: 2023-07-29 | Disposition: A | Discharge status: Home / Self Care | Payer: Medicare Other | Source: Ambulatory Visit | Attending: Family Medicine | Admitting: Family Medicine

## 2023-07-29 DIAGNOSIS — R1032 Left lower quadrant pain: Secondary | ICD-10-CM | POA: Diagnosis not present

## 2023-07-29 DIAGNOSIS — R1110 Vomiting, unspecified: Secondary | ICD-10-CM | POA: Diagnosis not present

## 2023-07-29 MED ORDER — IOPAMIDOL (ISOVUE-300) INJECTION 61%
100.0000 mL | Freq: Once | INTRAVENOUS | Status: AC | PRN
  Administered 2023-07-29: 100 mL via INTRAVENOUS

## 2023-07-30 ENCOUNTER — Encounter: Payer: Self-pay | Admitting: Family Medicine

## 2023-07-30 ENCOUNTER — Telehealth: Payer: Self-pay | Admitting: Internal Medicine

## 2023-07-30 DIAGNOSIS — I700 Atherosclerosis of aorta: Secondary | ICD-10-CM | POA: Insufficient documentation

## 2023-07-30 NOTE — Addendum Note (Signed)
Addended by: Gwenlyn Fudge on: 07/30/2023 10:52 AM   Modules accepted: Orders

## 2023-07-30 NOTE — Telephone Encounter (Signed)
Please see comment on CT results.

## 2023-07-30 NOTE — Telephone Encounter (Signed)
Patient's wife called and said patient had a CT done yesterday and there was concern it may be pneumonia. He saw Deliah Boston on 07/28/2023 and she order the imaging. They would like to know what the provider would advise. Best callback is [phone].

## 2023-07-31 ENCOUNTER — Ambulatory Visit
Admission: RE | Admit: 2023-07-31 | Discharge: 2023-07-31 | Disposition: A | Discharge status: Home / Self Care | Payer: Medicare Other | Source: Ambulatory Visit | Attending: Family Medicine | Admitting: Family Medicine

## 2023-07-31 ENCOUNTER — Other Ambulatory Visit: Payer: Self-pay

## 2023-07-31 ENCOUNTER — Ambulatory Visit
Admission: RE | Admit: 2023-07-31 | Discharge: 2023-07-31 | Disposition: A | Discharge status: Home / Self Care | Payer: Medicare Other | Attending: Family Medicine | Admitting: Family Medicine

## 2023-07-31 DIAGNOSIS — R918 Other nonspecific abnormal finding of lung field: Secondary | ICD-10-CM | POA: Diagnosis not present

## 2023-07-31 MED ORDER — AMOXICILLIN-POT CLAVULANATE 875-125 MG PO TABS
1.0000 | ORAL_TABLET | Freq: Two times a day (BID) | ORAL | 0 refills | Status: AC
  Filled 2023-07-31: qty 14, 7d supply, fill #0

## 2023-07-31 MED ORDER — AZITHROMYCIN 250 MG PO TABS
ORAL_TABLET | ORAL | 0 refills | Status: AC
  Filled 2023-07-31: qty 6, 5d supply, fill #0

## 2023-07-31 NOTE — Telephone Encounter (Signed)
Pt wife aware of results and the need for further xray - he went to  reg this morning at 800 am to get the CXR   They are aware as soon as the results show, we will call them back

## 2023-07-31 NOTE — Addendum Note (Signed)
Addended by: Gwenlyn Fudge on: 07/31/2023 03:41 PM   Modules accepted: Orders

## 2023-08-01 ENCOUNTER — Other Ambulatory Visit: Payer: Self-pay

## 2023-08-01 ENCOUNTER — Telehealth: Payer: Self-pay | Admitting: Internal Medicine

## 2023-08-01 ENCOUNTER — Encounter: Payer: Self-pay | Admitting: Family Medicine

## 2023-08-01 ENCOUNTER — Other Ambulatory Visit (HOSPITAL_COMMUNITY): Payer: Self-pay

## 2023-08-01 MED ORDER — PROMETHAZINE-DM 6.25-15 MG/5ML PO SYRP
5.0000 mL | ORAL_SOLUTION | Freq: Four times a day (QID) | ORAL | 0 refills | Status: DC | PRN
  Filled 2023-08-01: qty 118, 6d supply, fill #0

## 2023-08-01 NOTE — Telephone Encounter (Signed)
Sorry, I cannot tell when the imaging should take place, but I did send a cough medicine- done erx

## 2023-08-01 NOTE — Telephone Encounter (Signed)
Pt wife called stating pt was seen this week and he is still having a bad cough Dr. Yetta Barre prescribed antibiotics and a Z-pack but now is inquiring about some cough syrup please be advise.

## 2023-08-01 NOTE — Telephone Encounter (Signed)
Please advise for patient

## 2023-08-01 NOTE — Telephone Encounter (Signed)
Please advise 

## 2023-08-11 ENCOUNTER — Other Ambulatory Visit: Payer: Self-pay | Admitting: Internal Medicine

## 2023-08-13 ENCOUNTER — Ambulatory Visit: Payer: Medicare Other | Admitting: Podiatry

## 2023-08-13 ENCOUNTER — Ambulatory Visit: Payer: Medicare Other | Admitting: Internal Medicine

## 2023-08-15 ENCOUNTER — Ambulatory Visit: Payer: Medicare Other

## 2023-08-15 ENCOUNTER — Ambulatory Visit
Admission: RE | Admit: 2023-08-15 | Discharge: 2023-08-15 | Disposition: A | Discharge status: Home / Self Care | Payer: Medicare Other | Source: Ambulatory Visit | Attending: Family Medicine | Admitting: Family Medicine

## 2023-08-15 DIAGNOSIS — R911 Solitary pulmonary nodule: Secondary | ICD-10-CM | POA: Insufficient documentation

## 2023-08-15 DIAGNOSIS — R9389 Abnormal findings on diagnostic imaging of other specified body structures: Secondary | ICD-10-CM | POA: Insufficient documentation

## 2023-08-15 DIAGNOSIS — J439 Emphysema, unspecified: Secondary | ICD-10-CM | POA: Diagnosis not present

## 2023-09-03 ENCOUNTER — Ambulatory Visit: Payer: Medicare Other | Admitting: Podiatry

## 2023-09-10 ENCOUNTER — Ambulatory Visit: Payer: Medicare Other | Admitting: Podiatry

## 2023-10-15 ENCOUNTER — Other Ambulatory Visit: Payer: Self-pay | Admitting: Internal Medicine

## 2023-10-15 ENCOUNTER — Other Ambulatory Visit (HOSPITAL_COMMUNITY): Payer: Self-pay

## 2023-10-15 DIAGNOSIS — K219 Gastro-esophageal reflux disease without esophagitis: Secondary | ICD-10-CM

## 2023-10-15 DIAGNOSIS — E785 Hyperlipidemia, unspecified: Secondary | ICD-10-CM

## 2023-10-15 DIAGNOSIS — F411 Generalized anxiety disorder: Secondary | ICD-10-CM

## 2023-10-15 MED ORDER — SERTRALINE HCL 100 MG PO TABS
100.0000 mg | ORAL_TABLET | Freq: Every day | ORAL | 0 refills | Status: DC
Start: 2023-10-15 — End: 2024-01-22
  Filled 2023-10-15: qty 90, 90d supply, fill #0

## 2023-10-15 MED ORDER — PANTOPRAZOLE SODIUM 40 MG PO TBEC
40.0000 mg | DELAYED_RELEASE_TABLET | Freq: Every day | ORAL | 0 refills | Status: DC
  Filled 2023-10-15: qty 90, 90d supply, fill #0

## 2023-10-15 MED ORDER — ROSUVASTATIN CALCIUM 20 MG PO TABS
20.0000 mg | ORAL_TABLET | Freq: Every day | ORAL | 0 refills | Status: DC
  Filled 2023-10-15: qty 90, 90d supply, fill #0

## 2023-10-16 ENCOUNTER — Other Ambulatory Visit (HOSPITAL_COMMUNITY): Payer: Self-pay

## 2023-10-16 ENCOUNTER — Other Ambulatory Visit: Payer: Self-pay

## 2023-10-22 ENCOUNTER — Other Ambulatory Visit (HOSPITAL_COMMUNITY): Payer: Self-pay

## 2023-10-29 ENCOUNTER — Encounter: Payer: Self-pay | Admitting: Internal Medicine

## 2023-10-29 ENCOUNTER — Ambulatory Visit (INDEPENDENT_AMBULATORY_CARE_PROVIDER_SITE_OTHER): Payer: Medicare Other | Admitting: Internal Medicine

## 2023-10-29 ENCOUNTER — Other Ambulatory Visit (HOSPITAL_COMMUNITY): Payer: Self-pay

## 2023-10-29 VITALS — BP 122/72 | HR 67 | Temp 98.1°F | Resp 16 | Ht 67.0 in | Wt 148.2 lb

## 2023-10-29 DIAGNOSIS — R64 Cachexia: Secondary | ICD-10-CM | POA: Diagnosis not present

## 2023-10-29 DIAGNOSIS — N1831 Chronic kidney disease, stage 3a: Secondary | ICD-10-CM

## 2023-10-29 DIAGNOSIS — I700 Atherosclerosis of aorta: Secondary | ICD-10-CM | POA: Diagnosis not present

## 2023-10-29 DIAGNOSIS — J449 Chronic obstructive pulmonary disease, unspecified: Secondary | ICD-10-CM | POA: Diagnosis not present

## 2023-10-29 DIAGNOSIS — E519 Thiamine deficiency, unspecified: Secondary | ICD-10-CM | POA: Diagnosis not present

## 2023-10-29 DIAGNOSIS — I480 Paroxysmal atrial fibrillation: Secondary | ICD-10-CM | POA: Diagnosis not present

## 2023-10-29 DIAGNOSIS — R972 Elevated prostate specific antigen [PSA]: Secondary | ICD-10-CM

## 2023-10-29 DIAGNOSIS — D510 Vitamin B12 deficiency anemia due to intrinsic factor deficiency: Secondary | ICD-10-CM | POA: Diagnosis not present

## 2023-10-29 DIAGNOSIS — N401 Enlarged prostate with lower urinary tract symptoms: Secondary | ICD-10-CM | POA: Diagnosis not present

## 2023-10-29 DIAGNOSIS — N138 Other obstructive and reflux uropathy: Secondary | ICD-10-CM | POA: Diagnosis not present

## 2023-10-29 LAB — BASIC METABOLIC PANEL
BUN: 21 mg/dL (ref 6–23)
CO2: 28 meq/L (ref 19–32)
Calcium: 9 mg/dL (ref 8.4–10.5)
Chloride: 105 meq/L (ref 96–112)
Creatinine, Ser: 0.93 mg/dL (ref 0.40–1.50)
GFR: 85 mL/min (ref >60.00)
Glucose, Bld: 124 mg/dL — ABNORMAL HIGH (ref 70–99)
Potassium: 4.1 meq/L (ref 3.5–5.1)
Sodium: 142 meq/L (ref 135–145)

## 2023-10-29 LAB — CBC WITH DIFFERENTIAL/PLATELET
Basophils Absolute: 0 10*3/uL (ref 0.0–0.1)
Basophils Relative: 0.4 % (ref 0.0–3.0)
Eosinophils Absolute: 0.1 10*3/uL (ref 0.0–0.7)
Eosinophils Relative: 1.6 % (ref 0.0–5.0)
HCT: 41.7 % (ref 39.0–52.0)
Hemoglobin: 13.7 g/dL (ref 13.0–17.0)
Lymphocytes Relative: 29.5 % (ref 12.0–46.0)
Lymphs Abs: 2.2 10*3/uL (ref 0.7–4.0)
MCHC: 33 g/dL (ref 30.0–36.0)
MCV: 96.5 fL (ref 78.0–100.0)
Monocytes Absolute: 0.6 10*3/uL (ref 0.1–1.0)
Monocytes Relative: 7.7 % (ref 3.0–12.0)
Neutro Abs: 4.6 10*3/uL (ref 1.4–7.7)
Neutrophils Relative %: 60.8 % (ref 43.0–77.0)
Platelets: 243 10*3/uL (ref 150.0–400.0)
RBC: 4.32 Mil/uL (ref 4.22–5.81)
RDW: 14.5 % (ref 11.5–15.5)
WBC: 7.5 10*3/uL (ref 4.0–10.5)

## 2023-10-29 LAB — URINALYSIS, ROUTINE W REFLEX MICROSCOPIC
Bilirubin Urine: NEGATIVE
Hgb urine dipstick: NEGATIVE
Ketones, ur: NEGATIVE
Leukocytes,Ua: NEGATIVE
Nitrite: NEGATIVE
Specific Gravity, Urine: 1.025 (ref 1.000–1.030)
Total Protein, Urine: NEGATIVE
Urine Glucose: NEGATIVE
Urobilinogen, UA: 0.2 (ref 0.0–1.0)
pH: 6 (ref 5.0–8.0)

## 2023-10-29 LAB — PSA: PSA: 6.13 ng/mL — ABNORMAL HIGH (ref 0.10–4.00)

## 2023-10-29 LAB — VITAMIN B12: Vitamin B-12: 142 pg/mL — ABNORMAL LOW (ref 211–911)

## 2023-10-29 MED ORDER — ENSURE ENLIVE PO LIQD
237.0000 mL | Freq: Two times a day (BID) | ORAL | 12 refills | Status: AC
Start: 2023-10-29 — End: ?

## 2023-10-29 NOTE — Progress Notes (Addendum)
 Subjective:  Patient ID: Taylor Ochoa, male    DOB: [birth year]  Age: 68 y.o. MRN: [id number]  CC: Atrial Fibrillation   HPI Taylor Ochoa presents for f/up ----  Discussed the use of AI scribe software for clinical note transcription with the patient, who gave verbal consent to proceed.  History of Present Illness   Taylor Ochoa is a 68 year old male who presents with concerns about B12 levels and leg weakness. He is accompanied by a caregiver who provides additional information about his condition.  He is concerned about his B12 levels, fearing they may be low again. He is not currently due for a B12 injection, as it was previously stopped. No numbness or tingling, which are common symptoms of B12 deficiency.  He experiences weakness in his leg, which has been ongoing for a while. No chest pain, shortness of breath, dizziness, or lightheadedness. His caregiver notes problems with walking, describing his steps as 'little baby steps.'  He has hip pain that has persisted for eight months, sometimes affecting his ability to walk. He recalls a previous consultation with a hip doctor who considered surgery. He is interested in receiving a steroid injection for pain relief.  His caregiver notes that he is not eating as he should and sometimes forgets to eat. He is not interested in food, and a small hamburger is insufficient for his nutritional needs. There is a discussion about the possibility of using a meal supplement or an appetite stimulant to improve his intake.       Outpatient Medications Prior to Visit  Medication Sig Dispense Refill   bisacodyl  (DULCOLAX) 10 MG suppository Place 1 suppository (10 mg total) rectally daily as needed for moderate constipation. 12 suppository 0   Cholecalciferol  50 MCG (2000 UT) TABS Take 1 tablet (2,000 Units total) by mouth daily. 90 tablet 1   cyanocobalamin  2000 MCG tablet Take 1 tablet (2,000 mcg total) by mouth daily. 90 tablet 1   docusate  sodium (COLACE) 100 MG capsule Take 1 capsule (100 mg total) by mouth 2 (two) times daily as directed 100 capsule 0   gabapentin  (NEURONTIN ) 600 MG tablet Take 1 tablet (600 mg total) by mouth 3 (three) times daily. 270 tablet 0   hydrOXYzine  (ATARAX ) 10 MG tablet Take 1 tablet (10 mg total) by mouth every 8 (eight) hours as needed. 30 tablet 0   methocarbamol  (ROBAXIN ) 500 MG tablet Take 1 tablet (500 mg total) by mouth every 6 (six) hours as needed for muscle spasms. 40 tablet 0   Multiple Vitamin (MULTIVITAMIN WITH MINERALS) TABS tablet Take 1 tablet by mouth daily.     pantoprazole  (PROTONIX ) 40 MG tablet Take 1 tablet (40 mg total) by mouth daily. 90 tablet 0   polyethylene glycol (MIRALAX  / GLYCOLAX ) 17 g packet Take 17 g by mouth 2 (two) times daily. 14 each 0   rosuvastatin  (CRESTOR ) 20 MG tablet Take 1 tablet (20 mg total) by mouth daily. 90 tablet 0   sertraline  (ZOLOFT ) 100 MG tablet Take 1 tablet (100 mg total) by mouth daily. 90 tablet 0   thiamine  (VITAMIN B1) 100 MG tablet Take 1 tablet (100 mg total) by mouth daily. 90 tablet 0   Tiotropium Bromide  Monohydrate (SPIRIVA  RESPIMAT) 1.25 MCG/ACT AERS Inhale 2 puffs into the lungs daily. 4 g 5   traMADol  (ULTRAM ) 50 MG tablet Take 1 tablet (50 mg total) by mouth every 6 (six) hours as needed for severe pain. 30 tablet  0   feeding supplement (ENSURE ENLIVE / ENSURE PLUS) LIQD Take 237 mLs by mouth 2 (two) times daily between meals. 237 mL 12   ondansetron  (ZOFRAN -ODT) 4 MG disintegrating tablet Take 1 tablet (4 mg total) by mouth every 8 (eight) hours as needed for nausea or vomiting. 20 tablet 0   promethazine -dextromethorphan (PROMETHAZINE -DM) 6.25-15 MG/5ML syrup Take 5 mLs by mouth 4 (four) times daily as needed. 118 mL 0   No facility-administered medications prior to visit.    ROS Review of Systems  Constitutional: Negative.  Negative for appetite change, chills, diaphoresis and fatigue.  HENT: Negative.  Negative for trouble  swallowing.   Respiratory:  Positive for shortness of breath. Negative for cough, chest tightness and wheezing.   Cardiovascular:  Negative for chest pain, palpitations and leg swelling.  Gastrointestinal:  Negative for abdominal pain, blood in stool, constipation, diarrhea, nausea and vomiting.  Endocrine: Negative.   Genitourinary: Negative.  Negative for difficulty urinating.  Musculoskeletal:  Positive for arthralgias and gait problem.  Skin: Negative.   Neurological:  Negative for dizziness, weakness, light-headedness and headaches.  Hematological:  Negative for adenopathy. Does not bruise/bleed easily.  Psychiatric/Behavioral:  Positive for confusion and decreased concentration.     Objective:  BP 122/72 (BP Location: Right Arm, Cuff Size: Normal)   Pulse 67   Temp 98.1 F (36.7 C) (Temporal)   Resp 16   Ht 5' 7 (1.702 m)   Wt 148 lb 4 oz (67.2 kg)   SpO2 95%   BMI 23.22 kg/m   BP Readings from Last 3 Encounters:  10/29/23 122/72  07/28/23 (!) 90/52  07/16/23 135/77    Wt Readings from Last 3 Encounters:  10/29/23 148 lb 4 oz (67.2 kg)  07/28/23 147 lb (66.7 kg)  06/25/23 151 lb (68.5 kg)    Physical Exam Vitals reviewed.  Constitutional:      General: He is not in acute distress.    Appearance: He is ill-appearing. He is not toxic-appearing or diaphoretic.  HENT:     Mouth/Throat:     Mouth: Mucous membranes are moist.  Eyes:     General: No scleral icterus.    Conjunctiva/sclera: Conjunctivae normal.  Cardiovascular:     Rate and Rhythm: Normal rate and regular rhythm. Occasional Extrasystoles are present.    Pulses: Normal pulses.     Heart sounds: No murmur heard.    Comments: EKG- SR with a PVC, 62 bpm No LVH, Q waves, or ST/T wave changes  Unchanged  Pulmonary:     Effort: Pulmonary effort is normal.     Breath sounds: Examination of the left-upper field reveals wheezing. Examination of the left-middle field reveals wheezing. Wheezing present.  No decreased breath sounds, rhonchi or rales.  Abdominal:     General: Abdomen is flat.     Palpations: There is no mass.     Tenderness: There is no abdominal tenderness. There is no guarding.     Hernia: No hernia is present.  Musculoskeletal:        General: Normal range of motion.     Cervical back: Neck supple.     Right lower leg: No edema.     Left lower leg: No edema.  Skin:    General: Skin is warm and dry.     Findings: No rash.  Neurological:     Mental Status: He is alert. Mental status is at baseline.  Psychiatric:        Mood and Affect:  Mood normal.     Lab Results  Component Value Date   WBC 7.5 10/29/2023   HGB 13.7 10/29/2023   HCT 41.7 10/29/2023   PLT 243.0 10/29/2023   GLUCOSE 124 (H) 10/29/2023   CHOL 124 03/19/2023   TRIG 104.0 03/19/2023   HDL 45.80 03/19/2023   LDLCALC 58 03/19/2023   ALT 14 03/27/2023   AST 18 03/27/2023   NA 142 10/29/2023   K 4.1 10/29/2023   CL 105 10/29/2023   CREATININE 0.93 10/29/2023   BUN 21 10/29/2023   CO2 28 10/29/2023   TSH 1.019 03/27/2023   PSA 6.13 (H) 10/29/2023   INR 1.0 03/27/2023   HGBA1C 6.2 06/25/2023    CT CHEST WO CONTRAST Result Date: 08/31/2023 CLINICAL DATA:  Abnormal xray - lung nodule, >= 1 cm EXAM: CT CHEST WITHOUT CONTRAST TECHNIQUE: Multidetector CT imaging of the chest was performed following the standard protocol without IV contrast. RADIATION DOSE REDUCTION: This exam was performed according to the departmental dose-optimization program which includes automated exposure control, adjustment of the mA and/or kV according to patient size and/or use of iterative reconstruction technique. COMPARISON:  January 09, 2019 FINDINGS: Cardiovascular: Heart is the upper limits of normal in size. No pericardial effusion. Predominately LEFT-sided coronary artery atherosclerotic calcifications. Scattered atherosclerotic calcifications of the nonaneurysmal thoracic aorta. Mediastinum/Nodes: Visualized thyroid  is  unremarkable. No axillary or mediastinal adenopathy. Lungs/Pleura: Mild-to-moderate paraseptal emphysema. No pleural effusion or pneumothorax. There is mild basilar predominant subpleural reticulation. Diffuse bronchial wall thickening. Patchy lower lung predominant centrilobular nodularity most predominantly in the dependent portions of the lungs in the bilateral lower lobes and RIGHT middle lobe. Representative nodular opacity measures 9 mm (series 7, image 117). These are new since 2020 chest CT. Upper Abdomen: Cholelithiasis. Musculoskeletal: Symmetric mild gynecomastia. No acute osseous abnormality. IMPRESSION: 1. There is patchy lower lung predominant centrilobular nodularity most predominantly in the dependent portions of the lungs. This is favored to reflect an infectious/inflammatory process. Recommend follow-up chest CT in 3 months to assess for resolution. 2. Mild-to-moderate paraseptal emphysema. Given underlying emphysema, recommend evaluation for candidacy for annual lung cancer screening CT. Aortic Atherosclerosis (ICD10-I70.0) and Emphysema (ICD10-J43.9). Electronically Signed   By: Corean Salter M.D.   On: 08/31/2023 12:06    Assessment & Plan:  Pulmonary cachexia due to COPD (HCC) -     Ensure Enlive; Take 237 mLs by mouth 2 (two) times daily between meals.  Dispense: 237 mL; Refill: 12  Vitamin B12 deficiency anemia due to intrinsic factor deficiency -     CBC with Differential/Platelet; Future -     Vitamin B12; Future  Aortic atherosclerosis (HCC)- Risk factor modifications addressed.  Manifestations of thiamine  deficiency  BPH with obstruction/lower urinary tract symptoms -     Basic metabolic panel; Future -     Urinalysis, Routine w reflex microscopic; Future -     PSA; Future  Stage 3a chronic kidney disease (HCC)- Will avoid nephrotoxic agents  -     Basic metabolic panel; Future -     Urinalysis, Routine w reflex microscopic; Future  PAF (paroxysmal atrial  fibrillation) (HCC)- He has good R/R control. -     EKG 12-Lead  High prostate specific antigen (PSA) -     Ambulatory referral to Urology     Follow-up: Return in about 6 months (around 04/27/2024).  Debby Molt, MD

## 2023-10-29 NOTE — Patient Instructions (Signed)

## 2023-11-05 ENCOUNTER — Telehealth: Payer: Self-pay

## 2023-11-05 ENCOUNTER — Other Ambulatory Visit: Payer: Self-pay

## 2023-11-05 ENCOUNTER — Ambulatory Visit: Payer: Medicare Other

## 2023-11-05 DIAGNOSIS — N138 Other obstructive and reflux uropathy: Secondary | ICD-10-CM

## 2023-11-05 NOTE — Telephone Encounter (Signed)
Called and spoke with patient's spouse regarding their continuation of getting their prolia injection. They stated they would be reaching back out to me once they have talked to the patient.

## 2023-11-12 ENCOUNTER — Ambulatory Visit: Payer: Medicare Other

## 2023-11-26 ENCOUNTER — Ambulatory Visit (INDEPENDENT_AMBULATORY_CARE_PROVIDER_SITE_OTHER)

## 2023-11-26 DIAGNOSIS — D510 Vitamin B12 deficiency anemia due to intrinsic factor deficiency: Secondary | ICD-10-CM

## 2023-11-26 MED ORDER — CYANOCOBALAMIN 1000 MCG/ML IJ SOLN
1000.0000 ug | Freq: Once | INTRAMUSCULAR | Status: AC
  Administered 2023-11-26: 1000 ug via INTRAMUSCULAR

## 2023-11-26 NOTE — Progress Notes (Signed)
 Patient visits today for their b-12 injection. Patient informed of what they were receiving and tolerated injection well. Patient notified to reach out to the office if needed.

## 2023-12-24 ENCOUNTER — Ambulatory Visit (INDEPENDENT_AMBULATORY_CARE_PROVIDER_SITE_OTHER)

## 2023-12-24 DIAGNOSIS — D510 Vitamin B12 deficiency anemia due to intrinsic factor deficiency: Secondary | ICD-10-CM

## 2023-12-24 MED ORDER — CYANOCOBALAMIN 1000 MCG/ML IJ SOLN
1000.0000 ug | Freq: Once | INTRAMUSCULAR | Status: AC
  Administered 2023-12-24: 1000 ug via INTRAMUSCULAR

## 2023-12-24 NOTE — Progress Notes (Signed)
 Patient visits today to receive her B12 injection/vaccine. Patient was informed and tolerated well. Patient was notified to reach out to [place] if needed.

## 2024-01-20 ENCOUNTER — Other Ambulatory Visit (HOSPITAL_COMMUNITY): Payer: Self-pay

## 2024-01-20 ENCOUNTER — Other Ambulatory Visit: Payer: Self-pay | Admitting: Internal Medicine

## 2024-01-20 DIAGNOSIS — E785 Hyperlipidemia, unspecified: Secondary | ICD-10-CM

## 2024-01-20 DIAGNOSIS — F411 Generalized anxiety disorder: Secondary | ICD-10-CM

## 2024-01-20 DIAGNOSIS — K219 Gastro-esophageal reflux disease without esophagitis: Secondary | ICD-10-CM

## 2024-01-21 ENCOUNTER — Other Ambulatory Visit (HOSPITAL_COMMUNITY): Payer: Self-pay

## 2024-01-21 ENCOUNTER — Other Ambulatory Visit: Payer: Self-pay | Admitting: Internal Medicine

## 2024-01-21 DIAGNOSIS — F411 Generalized anxiety disorder: Secondary | ICD-10-CM

## 2024-01-21 DIAGNOSIS — K219 Gastro-esophageal reflux disease without esophagitis: Secondary | ICD-10-CM

## 2024-01-21 DIAGNOSIS — E785 Hyperlipidemia, unspecified: Secondary | ICD-10-CM

## 2024-01-22 ENCOUNTER — Other Ambulatory Visit (HOSPITAL_COMMUNITY): Payer: Self-pay

## 2024-01-22 ENCOUNTER — Other Ambulatory Visit: Payer: Self-pay | Admitting: Internal Medicine

## 2024-01-22 ENCOUNTER — Encounter (HOSPITAL_COMMUNITY): Payer: Self-pay

## 2024-01-22 DIAGNOSIS — E785 Hyperlipidemia, unspecified: Secondary | ICD-10-CM

## 2024-01-22 DIAGNOSIS — F411 Generalized anxiety disorder: Secondary | ICD-10-CM

## 2024-01-22 DIAGNOSIS — K219 Gastro-esophageal reflux disease without esophagitis: Secondary | ICD-10-CM

## 2024-01-22 MED ORDER — PANTOPRAZOLE SODIUM 40 MG PO TBEC
40.0000 mg | DELAYED_RELEASE_TABLET | Freq: Every day | ORAL | 0 refills | Status: DC
  Filled 2024-01-22: qty 90, 90d supply, fill #0

## 2024-01-22 MED ORDER — ROSUVASTATIN CALCIUM 20 MG PO TABS
20.0000 mg | ORAL_TABLET | Freq: Every day | ORAL | 0 refills | Status: DC
  Filled 2024-01-22: qty 90, 90d supply, fill #0

## 2024-01-22 MED ORDER — SERTRALINE HCL 100 MG PO TABS
100.0000 mg | ORAL_TABLET | Freq: Every day | ORAL | 0 refills | Status: DC
  Filled 2024-01-22: qty 90, 90d supply, fill #0

## 2024-01-22 NOTE — Telephone Encounter (Signed)
 Copied from CRM [id number]. Topic: Clinical - Medication Refill >> Jan 22, 2024  7:57 AM Marlan Silva wrote: Most Recent Primary Care Visit:  Provider: Maxine Spare  Department: [facility]  Visit Type: NURSE VISIT  Date: 12/24/2023  Medication: sertraline  (ZOLOFT ) 100 MG tablet, pantoprazole  (PROTONIX ) 40 MG tablet, rosuvastatin  (CRESTOR ) 20 MG tablet  Has the patient contacted their pharmacy? Yes (Agent: If no, request that the patient contact the pharmacy for the refill. If patient does not wish to contact the pharmacy document the reason why and proceed with request.) (Agent: If yes, when and what did the pharmacy advise?)  Is this the correct pharmacy for this prescription? Yes If no, delete pharmacy and type the correct one.  This is the patient's preferred pharmacy:  [facility] [address] [place] [address] Phone: [phone] Fax: [phone]   Has the prescription been filled recently? No  Is the patient out of the medication? Yes  Has the patient been seen for an appointment in the last year OR does the patient have an upcoming appointment? Yes  Can we respond through MyChart? Yes  Agent: Please be advised that Rx refills may take up to 3 business days. We ask that you follow-up with your pharmacy.

## 2024-01-22 NOTE — Telephone Encounter (Signed)
 Patient's wife, Stana Ear, called and asked for a nurse to give her a call back because she spoke with the pharmacy and they told her that the 3 meds were denied . I informed her that It shows pending on our end. Please call and advise.

## 2024-01-27 ENCOUNTER — Encounter: Payer: Self-pay | Admitting: *Deleted

## 2024-01-28 ENCOUNTER — Ambulatory Visit

## 2024-01-28 DIAGNOSIS — D510 Vitamin B12 deficiency anemia due to intrinsic factor deficiency: Secondary | ICD-10-CM | POA: Diagnosis not present

## 2024-01-28 MED ORDER — CYANOCOBALAMIN 1000 MCG/ML IJ SOLN
1000.0000 ug | Freq: Once | INTRAMUSCULAR | Status: AC
  Administered 2024-01-28: 1000 ug via INTRAMUSCULAR

## 2024-01-28 NOTE — Progress Notes (Signed)
 Pt here for monthly B12 injection per Dr.Jones  B12 1000mcg given IM and pt tolerated injection well.  Next B12 injection scheduled for next month. Patient scheduling on his way out

## 2024-02-02 ENCOUNTER — Other Ambulatory Visit (HOSPITAL_COMMUNITY): Payer: Self-pay

## 2024-02-20 ENCOUNTER — Ambulatory Visit

## 2024-02-20 VITALS — Ht 69.0 in | Wt 151.0 lb

## 2024-02-20 DIAGNOSIS — Z122 Encounter for screening for malignant neoplasm of respiratory organs: Secondary | ICD-10-CM | POA: Diagnosis not present

## 2024-02-20 DIAGNOSIS — Z0000 Encounter for general adult medical examination without abnormal findings: Secondary | ICD-10-CM

## 2024-02-20 DIAGNOSIS — F17200 Nicotine dependence, unspecified, uncomplicated: Secondary | ICD-10-CM

## 2024-02-20 DIAGNOSIS — Z0100 Encounter for examination of eyes and vision without abnormal findings: Secondary | ICD-10-CM

## 2024-02-20 DIAGNOSIS — Z23 Encounter for immunization: Secondary | ICD-10-CM
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
## 2024-02-20 NOTE — Patient Instructions (Addendum)
 Taylor Ochoa , Thank you for taking time out of your busy schedule to complete your Annual Wellness Visit with me. I enjoyed our conversation and look forward to speaking with you again next year. I, as well as your care team,  appreciate your ongoing commitment to your health goals. Please review the following plan we discussed and let me know if I can assist you in the future. Your Game plan/ To Do List    Referrals: If you haven't heard from the office you've been referred to, please reach out to them at the phone provided.  Referral to [facility] for a Lung Cancer Screening test.  Order for Pneumonia vaccine at office on 03/03/2024 with Nurse Follow up Visits: Next Medicare AWV with our clinical staff: 02/23/2025   Have you seen your provider in the last 6 months (3 months if uncontrolled diabetes)? Yes Next Office Visit with your provider: 04/28/2024  Clinician Recommendations:  Aim for 30 minutes of exercise or brisk walking, 6-8 glasses of water , and 5 servings of fruits and vegetables each day. Educated and advised on getting the Pneumonia, Shingles, and COVID vaccines at local pharmacy/PCP's office.      This is a list of the screening recommended for you and due dates:  Health Maintenance  Topic Date Due   Zoster (Shingles) Vaccine (1 of 2) Never done   COVID-19 Vaccine (2 - Janssen risk series) 02/22/2020   Pneumonia Vaccine (3 of 3 - PPSV23, PCV20 or PCV21) 04/07/2024   Flu Shot  04/23/2024   Screening for Lung Cancer  08/14/2024   Medicare Annual Wellness Visit  02/19/2025   DTaP/Tdap/Td vaccine (2 - Td or Tdap) 04/07/2029   Colon Cancer Screening  12/03/2032   Hepatitis C Screening  Completed   HPV Vaccine  Aged Out   Meningitis B Vaccine  Aged Out   Cologuard (Stool DNA test)  Discontinued    Advanced directives: (Declined) Advance directive discussed with you today. Even though you declined this today, please call our office should you change your mind, and we can give  you the proper paperwork for you to fill out. Advance Care Planning is important because it:  [x]  Makes sure you receive the medical care that is consistent with your values, goals, and preferences  [x]  It provides guidance to your family and loved ones and reduces their decisional burden about whether or not they are making the right decisions based on your wishes.  Follow the link provided in your after visit summary or read over the paperwork we have mailed to you to help you started getting your Advance Directives in place. If you need assistance in completing these, please reach out to us  so that we can help you!

## 2024-02-25 ENCOUNTER — Ambulatory Visit: Admitting: Podiatry

## 2024-03-03 ENCOUNTER — Ambulatory Visit (INDEPENDENT_AMBULATORY_CARE_PROVIDER_SITE_OTHER)

## 2024-03-03 DIAGNOSIS — Z23 Encounter for immunization: Secondary | ICD-10-CM | POA: Diagnosis not present

## 2024-03-03 DIAGNOSIS — E538 Deficiency of other specified B group vitamins: Secondary | ICD-10-CM

## 2024-03-03 MED ORDER — CYANOCOBALAMIN 1000 MCG/ML IJ SOLN
1000.0000 ug | Freq: Once | INTRAMUSCULAR | Status: AC
  Administered 2024-03-03: 1000 ug via INTRAMUSCULAR

## 2024-03-03 NOTE — Progress Notes (Signed)
 After obtaining consent, and per orders of Dr. Rochelle Chu, injection of B12 and Prevnar 20 given by Bretta Camp. Patient  to report any adverse reaction to me immediately.

## 2024-03-12 ENCOUNTER — Emergency Department

## 2024-03-12 ENCOUNTER — Other Ambulatory Visit: Payer: Self-pay

## 2024-03-12 ENCOUNTER — Emergency Department
Admission: EM | Admit: 2024-03-12 | Discharge: 2024-03-12 | Disposition: A | Discharge status: Home / Self Care | Attending: Emergency Medicine | Admitting: Emergency Medicine

## 2024-03-12 DIAGNOSIS — J449 Chronic obstructive pulmonary disease, unspecified: Secondary | ICD-10-CM | POA: Diagnosis not present

## 2024-03-12 DIAGNOSIS — J209 Acute bronchitis, unspecified: Secondary | ICD-10-CM

## 2024-03-12 DIAGNOSIS — J40 Bronchitis, not specified as acute or chronic: Secondary | ICD-10-CM | POA: Diagnosis not present

## 2024-03-12 DIAGNOSIS — N189 Chronic kidney disease, unspecified: Secondary | ICD-10-CM | POA: Insufficient documentation

## 2024-03-12 DIAGNOSIS — J439 Emphysema, unspecified: Secondary | ICD-10-CM | POA: Diagnosis not present

## 2024-03-12 DIAGNOSIS — R0602 Shortness of breath: Secondary | ICD-10-CM | POA: Diagnosis not present

## 2024-03-12 DIAGNOSIS — R079 Chest pain, unspecified: Secondary | ICD-10-CM | POA: Diagnosis not present

## 2024-03-12 LAB — BASIC METABOLIC PANEL WITH GFR
Anion gap: 9 (ref 5–15)
BUN: 25 mg/dL — ABNORMAL HIGH (ref 8–23)
CO2: 26 mmol/L (ref 22–32)
Calcium: 8.7 mg/dL — ABNORMAL LOW (ref 8.9–10.3)
Chloride: 103 mmol/L (ref 98–111)
Creatinine, Ser: 0.92 mg/dL (ref 0.61–1.24)
GFR, Estimated: >60 mL/min (ref >60)
Glucose, Bld: 156 mg/dL — ABNORMAL HIGH (ref 70–99)
Potassium: 3.6 mmol/L (ref 3.5–5.1)
Sodium: 138 mmol/L (ref 135–145)

## 2024-03-12 LAB — RESP PANEL BY RT-PCR (RSV, FLU A&B, COVID)  RVPGX2
Influenza A by PCR: NEGATIVE
Influenza B by PCR: NEGATIVE
Resp Syncytial Virus by PCR: NEGATIVE
SARS Coronavirus 2 by RT PCR: NEGATIVE

## 2024-03-12 LAB — CBC
HCT: 39.8 % (ref 39.0–52.0)
Hemoglobin: 13.3 g/dL (ref 13.0–17.0)
MCH: 32.9 pg (ref 26.0–34.0)
MCHC: 33.4 g/dL (ref 30.0–36.0)
MCV: 98.5 fL (ref 80.0–100.0)
Platelets: 291 10*3/uL (ref 150–400)
RBC: 4.04 MIL/uL — ABNORMAL LOW (ref 4.22–5.81)
RDW: 12.6 % (ref 11.5–15.5)
WBC: 11.5 10*3/uL — ABNORMAL HIGH (ref 4.0–10.5)
nRBC: 0 % (ref 0.0–0.2)

## 2024-03-12 MED ORDER — PREDNISONE 20 MG PO TABS
60.0000 mg | ORAL_TABLET | Freq: Once | ORAL | Status: AC
  Administered 2024-03-12: 60 mg via ORAL
  Filled 2024-03-12: qty 3

## 2024-03-12 MED ORDER — PREDNISONE 50 MG PO TABS: 50.0000 mg | ORAL_TABLET | Freq: Every day | ORAL | 0 refills | Status: AC

## 2024-03-12 MED ORDER — AZITHROMYCIN 250 MG PO TABS: ORAL_TABLET | ORAL | 0 refills | Status: AC

## 2024-03-12 NOTE — ED Triage Notes (Signed)
 Pt arrived from home via POV c/o felling bad since Wednesday. Pt states that its hard to get a deep breath and has had a productive cough. Per family sputum is very thick and greenish yellow in color. Pt states that he has slight fever since Wed. Highest being 100.8.

## 2024-03-12 NOTE — Discharge Instructions (Addendum)
 Take the prednisone  and the antibiotic as prescribed.  Use your albuterol  inhaler every 4-6 hours for the next several days.  Follow-up with your regular doctor.  Return to the ER for new, worsening, or persistent severe cough, shortness of breath, fever, weakness, or any other new or worsening symptoms that concern you.

## 2024-03-12 NOTE — ED Provider Notes (Signed)
 [facility] Provider Note    Event Date/Time   First MD Initiated Contact with Patient 03/12/24 2129     (approximate)   History   Cough and Shortness of Breath   HPI  Taylor Ochoa is a 67 y.o. male with a history of atrial fibrillation, COPD, BPH, and CKD who presents with a productive cough for the last several days, associated with some shortness of breath.  The patient denies any chest pain, fever, vomiting, diarrhea, or leg swelling.  I reviewed the past medical records.  The patient's most recent outpatient visit was with [facility] on 5/30 for an annual wellness visit.  He has no recent hospitalizations.   Physical Exam   Triage Vital Signs: ED Triage Vitals  Encounter Vitals Group     BP 03/12/24 1949 125/64     Girls Systolic BP Percentile --      Girls Diastolic BP Percentile --      Boys Systolic BP Percentile --      Boys Diastolic BP Percentile --      Pulse Rate 03/12/24 1949 (!) 58     Resp 03/12/24 1949 (!) 24     Temp 03/12/24 1949 99.4 F (37.4 C)     Temp Source 03/12/24 1949 Oral     SpO2 03/12/24 1948 96 %     Weight 03/12/24 1951 152 lb (68.9 kg)     Height 03/12/24 1951 5' 9 (1.753 m)     Head Circumference --      Peak Flow --      Pain Score 03/12/24 1950 6     Pain Loc --      Pain Education --      Exclude from Growth Chart --     Most recent vital signs: Vitals:   03/12/24 2258 03/12/24 2259  BP:  126/87  Pulse: 79   Resp:    Temp:    SpO2: 100%      General: Alert, comfortable appearing, no distress.  CV:  Good peripheral perfusion.  Resp:  Normal effort.  Diminished breath sounds bilaterally. Abd:  No distention.  Other:  No peripheral edema.   ED Results / Procedures / Treatments   Labs (all labs ordered are listed, but only abnormal results are displayed) Labs Reviewed  BASIC METABOLIC PANEL WITH GFR - Abnormal; Notable for the following components:      Result Value    Glucose, Bld 156 (*)    BUN 25 (*)    Calcium  8.7 (*)    All other components within normal limits  CBC - Abnormal; Notable for the following components:   WBC 11.5 (*)    RBC 4.04 (*)    All other components within normal limits  RESP PANEL BY RT-PCR (RSV, FLU A&B, COVID)  [id number]     EKG  ED ECG REPORT I, Lind Repine, the attending physician, personally viewed and interpreted this ECG.  Date: 03/12/2024 EKG Time: 2001 Rate: 79 Rhythm: normal sinus rhythm with PACs QRS Axis: normal Intervals: normal ST/T Wave abnormalities: normal Narrative Interpretation: no evidence of acute ischemia    RADIOLOGY  Chest X-ray: I independently viewed and interpreted the images; there are bronchitic changes with no focal consolidation or edema   PROCEDURES:  Critical Care performed: No  Procedures   MEDICATIONS ORDERED IN ED: Medications  predniSONE  (DELTASONE ) tablet 60 mg (60 mg Oral Given 03/12/24 2253)     IMPRESSION / MDM /  ASSESSMENT AND PLAN / ED COURSE  I reviewed the triage vital signs and the nursing notes.  68 year old male with PMH as noted above presents with cough and shortness of breath for the last few days.  He has had a low-grade fever.  On exam here, he is overall well-appearing.  Vital signs are normal except for borderline elevated temperature.  His O2 saturation is in the mid 90s on room air and he has no increased work of breathing or respiratory distress during my exam.  Lung sounds are diminished.  There is no edema.  Differential diagnosis includes, but is not limited to, OPD exacerbation, acute bronchitis, pneumonia, viral syndrome.  Chest x-ray shows bronchitic changes with no consolidation.  CBC shows mild leukocytosis.  BMP is normal.  Will obtain a respiratory panel, give a steroid dose, and reassess.  The patient is feeling well overall and would like to go home.  Patient's presentation is most consistent with acute complicated illness /  injury requiring diagnostic workup.  The patient is on the cardiac monitor to evaluate for evidence of arrhythmia and/or significant heart rate changes.  ----------------------------------------- 11:20 PM on 03/12/2024 -----------------------------------------  Respiratory panel has been sent.  Since this will not change his management, he is stable for discharge home at this time.  I counseled him on the results of the workup and plan of care.  I have prescribed azithromycin  and prednisone .  He will use his albuterol  at home.  I gave strict return precautions and he expressed understanding.   FINAL CLINICAL IMPRESSION(S) / ED DIAGNOSES   Final diagnoses:  Acute bronchitis, unspecified organism     Rx / DC Orders   ED Discharge Orders          Ordered    predniSONE  (DELTASONE ) 50 MG tablet  Daily        03/12/24 2316    azithromycin  (ZITHROMAX  Z-PAK) 250 MG tablet        03/12/24 2316             Note:  This document was prepared using Dragon voice recognition software and may include unintentional dictation errors.    Lind Repine, MD 03/12/24 2320

## 2024-03-24 ENCOUNTER — Encounter: Payer: Self-pay | Admitting: Podiatry

## 2024-03-24 ENCOUNTER — Ambulatory Visit: Admitting: Podiatry

## 2024-03-24 DIAGNOSIS — M79675 Pain in left toe(s): Secondary | ICD-10-CM

## 2024-03-24 DIAGNOSIS — M79674 Pain in right toe(s): Secondary | ICD-10-CM

## 2024-03-24 DIAGNOSIS — B351 Tinea unguium: Secondary | ICD-10-CM | POA: Diagnosis not present

## 2024-03-24 DIAGNOSIS — D2372 Other benign neoplasm of skin of left lower limb, including hip: Secondary | ICD-10-CM | POA: Diagnosis not present

## 2024-03-24 NOTE — Progress Notes (Signed)
  Subjective:  Patient ID: SOTA HETZ, male    DOB: 09/11/1956,  MRN: [id number]  No chief complaint on file.   68 y.o. male presents with the above complaint. History confirmed with patient.  Since last visit the callus on the toe has resolved the only issue now is on the heel.  His nails are thick and elongated causing pain and discomfort as well.  Objective:  Physical Exam: warm, good capillary refill, no trophic changes or ulcerative lesions, normal DP and PT pulses, and normal sensory exam.  Large benign-appearing skin lesion with petechial hemorrhage in the deep portions of the lateral heel Left Foot: dystrophic yellowed discolored nail plates with subungual debris Right Foot: dystrophic yellowed discolored nail plates with subungual debris  Assessment:   1. Benign neoplasm of skin of left lower extremity   2. Pain due to onychomycosis of toenails of both feet        Plan:  Patient was evaluated and treated and all questions answered.  Discussed the etiology and treatment options for the condition in detail with the patient. Recommended debridement of the nails today. Sharp and mechanical debridement performed of all painful and mycotic nails today. Nails debrided in length and thickness using a nail nipper to level of comfort. Follow up as needed for painful nails.    Improving with treatments, only dealing with the heel lesion now as well.  Today, recommended treatment with salicylic acid as noted in procedure note below.  Continue sal acid at home  Procedure: Destruction of Lesion Location: Left foot Instrumentation: 15 blade. Technique: Debridement of lesion to petechial bleeding. Aperture pad applied around lesion. Small amount of salinocaine applied to the base of the lesion. Dressing: Dry, sterile, compression dressing. Disposition: Patient tolerated procedure well. Advised to leave dressing on for 24-48 hours. Thereafter patient to wash the area with soap and  water  and applied band-aid.    No follow-ups on file.

## 2024-04-02 ENCOUNTER — Other Ambulatory Visit: Payer: Self-pay

## 2024-04-02 ENCOUNTER — Emergency Department
Admission: EM | Admit: 2024-04-02 | Discharge: 2024-04-02 | Disposition: A | Discharge status: Home / Self Care | Attending: Emergency Medicine | Admitting: Emergency Medicine

## 2024-04-02 ENCOUNTER — Emergency Department

## 2024-04-02 DIAGNOSIS — N189 Chronic kidney disease, unspecified: Secondary | ICD-10-CM | POA: Insufficient documentation

## 2024-04-02 DIAGNOSIS — M25511 Pain in right shoulder: Secondary | ICD-10-CM | POA: Insufficient documentation

## 2024-04-02 DIAGNOSIS — S0992XA Unspecified injury of nose, initial encounter: Secondary | ICD-10-CM | POA: Diagnosis not present

## 2024-04-02 DIAGNOSIS — R42 Dizziness and giddiness: Secondary | ICD-10-CM

## 2024-04-02 DIAGNOSIS — S0083XA Contusion of other part of head, initial encounter: Secondary | ICD-10-CM | POA: Diagnosis not present

## 2024-04-02 DIAGNOSIS — W19XXXA Unspecified fall, initial encounter: Secondary | ICD-10-CM | POA: Diagnosis not present

## 2024-04-02 DIAGNOSIS — J449 Chronic obstructive pulmonary disease, unspecified: Secondary | ICD-10-CM | POA: Insufficient documentation

## 2024-04-02 DIAGNOSIS — I6782 Cerebral ischemia: Secondary | ICD-10-CM | POA: Diagnosis not present

## 2024-04-02 DIAGNOSIS — S022XXA Fracture of nasal bones, initial encounter for closed fracture: Secondary | ICD-10-CM | POA: Diagnosis not present

## 2024-04-02 DIAGNOSIS — W010XXA Fall on same level from slipping, tripping and stumbling without subsequent striking against object, initial encounter: Secondary | ICD-10-CM | POA: Diagnosis not present

## 2024-04-02 DIAGNOSIS — M79621 Pain in right upper arm: Secondary | ICD-10-CM | POA: Diagnosis not present

## 2024-04-02 DIAGNOSIS — Z23 Encounter for immunization: Secondary | ICD-10-CM | POA: Diagnosis not present

## 2024-04-02 DIAGNOSIS — I6529 Occlusion and stenosis of unspecified carotid artery: Secondary | ICD-10-CM | POA: Diagnosis not present

## 2024-04-02 DIAGNOSIS — M25519 Pain in unspecified shoulder: Secondary | ICD-10-CM | POA: Diagnosis not present

## 2024-04-02 DIAGNOSIS — G9389 Other specified disorders of brain: Secondary | ICD-10-CM | POA: Diagnosis not present

## 2024-04-02 LAB — CBC WITH DIFFERENTIAL/PLATELET
Abs Immature Granulocytes: 0.02 K/uL (ref 0.00–0.07)
Basophils Absolute: 0 K/uL (ref 0.0–0.1)
Basophils Relative: 0 %
Eosinophils Absolute: 0 K/uL (ref 0.0–0.5)
Eosinophils Relative: 0 %
HCT: 42.8 % (ref 39.0–52.0)
Hemoglobin: 14.6 g/dL (ref 13.0–17.0)
Immature Granulocytes: 0 %
Lymphocytes Relative: 30 %
Lymphs Abs: 2.3 K/uL (ref 0.7–4.0)
MCH: 32.7 pg (ref 26.0–34.0)
MCHC: 34.1 g/dL (ref 30.0–36.0)
MCV: 96 fL (ref 80.0–100.0)
Monocytes Absolute: 0.4 K/uL (ref 0.1–1.0)
Monocytes Relative: 5 %
Neutro Abs: 4.9 K/uL (ref 1.7–7.7)
Neutrophils Relative %: 65 %
Platelets: 203 K/uL (ref 150–400)
RBC: 4.46 MIL/uL (ref 4.22–5.81)
RDW: 13.2 % (ref 11.5–15.5)
WBC: 7.7 K/uL (ref 4.0–10.5)
nRBC: 0 % (ref 0.0–0.2)

## 2024-04-02 LAB — URINALYSIS, W/ REFLEX TO CULTURE (INFECTION SUSPECTED)
Bacteria, UA: NONE SEEN
Bilirubin Urine: NEGATIVE
Glucose, UA: NEGATIVE mg/dL
Hgb urine dipstick: NEGATIVE
Ketones, ur: NEGATIVE mg/dL
Leukocytes,Ua: NEGATIVE
Nitrite: NEGATIVE
Protein, ur: NEGATIVE mg/dL
RBC / HPF: 0 RBC/hpf (ref 0–5)
Specific Gravity, Urine: 1.003 — ABNORMAL LOW (ref 1.005–1.030)
Squamous Epithelial / HPF: 0 /HPF (ref 0–5)
WBC, UA: 0 WBC/hpf (ref 0–5)
pH: 6 (ref 5.0–8.0)

## 2024-04-02 LAB — COMPREHENSIVE METABOLIC PANEL WITH GFR
ALT: 13 U/L (ref 0–44)
AST: 17 U/L (ref 15–41)
Albumin: 3.8 g/dL (ref 3.5–5.0)
Alkaline Phosphatase: 68 U/L (ref 38–126)
Anion gap: 9 (ref 5–15)
BUN: 14 mg/dL (ref 8–23)
CO2: 24 mmol/L (ref 22–32)
Calcium: 9.2 mg/dL (ref 8.9–10.3)
Chloride: 108 mmol/L (ref 98–111)
Creatinine, Ser: 0.87 mg/dL (ref 0.61–1.24)
GFR, Estimated: >60 mL/min (ref >60)
Glucose, Bld: 111 mg/dL — ABNORMAL HIGH (ref 70–99)
Potassium: 4 mmol/L (ref 3.5–5.1)
Sodium: 141 mmol/L (ref 135–145)
Total Bilirubin: 0.9 mg/dL (ref 0.0–1.2)
Total Protein: 7.4 g/dL (ref 6.5–8.1)

## 2024-04-02 LAB — CK: Total CK: 47 U/L — ABNORMAL LOW (ref 49–397)

## 2024-04-02 LAB — TROPONIN I (HIGH SENSITIVITY): Troponin I (High Sensitivity): 6 ng/L (ref <18)

## 2024-04-02 MED ORDER — LACTATED RINGERS IV BOLUS
1000.0000 mL | Freq: Once | INTRAVENOUS | Status: AC
  Administered 2024-04-02: 1000 mL via INTRAVENOUS

## 2024-04-02 MED ORDER — TETANUS-DIPHTH-ACELL PERTUSSIS 5-2.5-18.5 LF-MCG/0.5 IM SUSY
0.5000 mL | PREFILLED_SYRINGE | Freq: Once | INTRAMUSCULAR | Status: AC
  Administered 2024-04-02: 0.5 mL via INTRAMUSCULAR
  Filled 2024-04-02: qty 0.5

## 2024-04-02 NOTE — ED Triage Notes (Signed)
 Pt comes via EMS from [employer] with c/o fall. Pt was outside sitting and got hot. Pt went inside and fell. Pt has abrasion on nose. Pt states right upper arm and shoulder pain. Pt denies any loc. And no thinners.

## 2024-04-02 NOTE — ED Provider Notes (Signed)
 [facility] Provider Note    Event Date/Time   First MD Initiated Contact with Patient 04/02/24 1850     (approximate)   History   Fall   HPI  MAZIN Ochoa is a 68 y.o. male with history of COPD, GERD, anxiety, not on blood thinning medication presenting with a fall.  Patient states that he was at [employer], was outside, felt fairly warm, lightheaded, tripped and fell onto his face.  He denies any LOC.  Patient's family states that he seemed a little out of it after the fall.  States that he does not walk with a walker or cane but states that he should because of balance issues have been ongoing for a while.  She does not think that he passed out.  But thinks that he was also lightheaded when he went to go for the bathroom earlier.  He denies any chest pain or shortness of breath, no urinary symptoms, no nausea vomiting or diarrhea, no new weakness or numbness.  Was complaining of right shoulder pain as well as swelling to his nose.  Independent history obtained from family as above.  On independent chart review, he was seen by his primary care doctor in February, does have history of weakness to his legs has been ongoing for a while, also with history of BPH, CKD, paroxysmal A-fib.     Physical Exam   Triage Vital Signs: ED Triage Vitals  Encounter Vitals Group     BP 04/02/24 1645 113/70     Girls Systolic BP Percentile --      Girls Diastolic BP Percentile --      Boys Systolic BP Percentile --      Boys Diastolic BP Percentile --      Pulse Rate 04/02/24 1645 74     Resp 04/02/24 1645 19     Temp 04/02/24 1645 98.5 F (36.9 C)     Temp src --      SpO2 04/02/24 1645 100 %     Weight 04/02/24 1547 152 lb (68.9 kg)     Height 04/02/24 1547 5' 9 (1.753 m)     Head Circumference --      Peak Flow --      Pain Score --      Pain Loc --      Pain Education --      Exclude from Growth Chart --     Most recent vital signs: Vitals:    04/02/24 1645 04/02/24 2143  BP: 113/70 136/83  Pulse: 74 80  Resp: 19 19  Temp: 98.5 F (36.9 C) 97.7 F (36.5 C)  SpO2: 100% 92%     General: Awake, no distress.  CV:  Good peripheral perfusion.  Resp:  Normal effort.  No thoracic cage tenderness, no tachypnea or respiratory distress Abd:  No distention.  Soft nontender Other:  No focal weakness or numbness, pupils are equal reactive, extraocular movements are intact, no obvious facial asymmetry, he does have an abrasion to his forehead as well as over his nose with tenderness to his nose, no midline spinal tenderness, no tenderness to the rest of his face.  He does have tenderness to the right shoulder but able to range all his extremities.  Equal DP pulses bilaterally.  No midline spinal tenderness.   ED Results / Procedures / Treatments   Labs (all labs ordered are listed, but only abnormal results are displayed) Labs Reviewed  COMPREHENSIVE METABOLIC  PANEL WITH GFR - Abnormal; Notable for the following components:      Result Value   Glucose, Bld 111 (*)    All other components within normal limits  CK - Abnormal; Notable for the following components:   Total CK 47 (*)    All other components within normal limits  URINALYSIS, W/ REFLEX TO CULTURE (INFECTION SUSPECTED) - Abnormal; Notable for the following components:   Color, Urine COLORLESS (*)    APPearance CLEAR (*)    Specific Gravity, Urine 1.003 (*)    All other components within normal limits  CBC WITH DIFFERENTIAL/PLATELET  TROPONIN I (HIGH SENSITIVITY)  TROPONIN I (HIGH SENSITIVITY)     EKG  EKG shows, EKG shows sinus rhythm with occasional PVCs, rate is any, normal QS, normal QTc, no obvious ischemic ST elevation, not significantly changed compared to prior   RADIOLOGY On my independent interpretation, CT head without obvious intracranial hemorrhage   PROCEDURES:  Critical Care performed: No  Procedures   MEDICATIONS ORDERED IN ED: Medications   Tdap (BOOSTRIX) injection 0.5 mL (has no administration in time range)  lactated ringers  bolus 1,000 mL (0 mLs Intravenous Stopped 04/02/24 2141)     IMPRESSION / MDM / ASSESSMENT AND PLAN / ED COURSE  I reviewed the triage vital signs and the nursing notes.                              Differential diagnosis includes, but is not limited to, heat exhaustion, arrhythmia, atypical ACS, dehydration, electrolyte derangements, UTI.  For his traumatic injuries considered intracranial hemorrhage, fracture, contusion, strain, sprain.  CT head, cervical spine, max face, right shoulder x-ray was done out at triage.  Will get labs, EKG, troponin, CK, UA.  Will give him some IV fluids.  Tdap.  Patient's presentation is most consistent with acute presentation with potential threat to life or bodily function.  Independent interpretation of labs and imaging below.  Patient was observed in the emergency department, given a bolus of IV fluids, feeling a lot better.  Steady gait with ambulation.  Labs are reassuring.  I discussed with him and family about imaging and lab results including incidental findings, for his nasal bone fractures, he can ice it, take Tylenol  as needed for pain, I provided him the number to call for ENT to follow-up as needed.  Otherwise instructed him to follow his primary care doctor for further management of his symptoms.  Considered but no indication for inpatient admission at this time, he is safe for outpatient management.  Shared decision making done with patient and he is agreeable with this plan.  Discharged with strict return precautions.  The patient is on the cardiac monitor to evaluate for evidence of arrhythmia and/or significant heart rate changes.   Clinical Course as of 04/02/24 2144  Fri Apr 02, 2024  1901 CT Head Wo Contrast IMPRESSION: No CT evidence of acute intracranial abnormality.  No acute fracture or traumatic malalignment of the cervical  spine.  Nondisplaced bilateral nasal bone fractures with overlying soft tissue swelling.  Similar appearance of ventriculomegaly.  Chronic and degenerative changes as above.   [TT]  1901 DG Shoulder Right Negative.  [TT]  2120 Independent review of labs, electrolytes are severely deranged, UA is not consistent with UTI, electrolytes not severely deranged, LFTs are normal, troponin is negative, CK is not elevated. [TT]    Clinical Course User Index [TT] Waymond Lorelle Cummins, MD  FINAL CLINICAL IMPRESSION(S) / ED DIAGNOSES   Final diagnoses:  Fall, initial encounter  Contusion of face, initial encounter  Lightheadedness  Closed fracture of nasal bone, initial encounter  Acute pain of right shoulder     Rx / DC Orders   ED Discharge Orders     None        Note:  This document was prepared using Dragon voice recognition software and may include unintentional dictation errors.    Waymond Lorelle Cummins, MD 04/02/24 [phone]

## 2024-04-02 NOTE — Discharge Instructions (Signed)
 Please make sure to keep yourself hydrated.  Please make sure to follow-up with your primary care doctor for further management of your symptoms.  I have also put in a phone number for you to call for the ENT surgeon for your nasal bone fractures.  You can take Tylenol  as needed for pain.

## 2024-04-02 NOTE — ED Notes (Signed)
 Pt ambulated without assistance and denied dizziness. Provider aware

## 2024-04-07 ENCOUNTER — Ambulatory Visit (INDEPENDENT_AMBULATORY_CARE_PROVIDER_SITE_OTHER)

## 2024-04-07 DIAGNOSIS — E538 Deficiency of other specified B group vitamins: Secondary | ICD-10-CM | POA: Diagnosis not present

## 2024-04-07 MED ORDER — CYANOCOBALAMIN 1000 MCG/ML IJ SOLN
1000.0000 ug | Freq: Once | INTRAMUSCULAR | Status: AC
  Administered 2024-04-07: 1000 ug via INTRAMUSCULAR

## 2024-04-07 NOTE — Progress Notes (Signed)
 After obtaining consent, and per orders of Dr. Yetta Barre, injection of B12 given by Ferdie Ping. Patient instructed to report any adverse reaction to me immediately.

## 2024-04-28 ENCOUNTER — Ambulatory Visit: Payer: Medicare Other | Admitting: Internal Medicine

## 2024-05-03 ENCOUNTER — Other Ambulatory Visit (HOSPITAL_COMMUNITY): Payer: Self-pay

## 2024-05-03 ENCOUNTER — Other Ambulatory Visit: Payer: Self-pay | Admitting: Internal Medicine

## 2024-05-03 DIAGNOSIS — F411 Generalized anxiety disorder: Secondary | ICD-10-CM

## 2024-05-03 DIAGNOSIS — E785 Hyperlipidemia, unspecified: Secondary | ICD-10-CM

## 2024-05-03 DIAGNOSIS — K219 Gastro-esophageal reflux disease without esophagitis: Secondary | ICD-10-CM

## 2024-05-03 MED ORDER — ROSUVASTATIN CALCIUM 20 MG PO TABS
20.0000 mg | ORAL_TABLET | Freq: Every day | ORAL | 0 refills | Status: DC
  Filled 2024-05-03: qty 90, 90d supply, fill #0

## 2024-05-03 MED ORDER — SERTRALINE HCL 100 MG PO TABS
100.0000 mg | ORAL_TABLET | Freq: Every day | ORAL | 0 refills | Status: DC
  Filled 2024-05-03: qty 90, 90d supply, fill #0

## 2024-05-03 MED ORDER — PANTOPRAZOLE SODIUM 40 MG PO TBEC
40.0000 mg | DELAYED_RELEASE_TABLET | Freq: Every day | ORAL | 0 refills | Status: DC
  Filled 2024-05-03: qty 90, 90d supply, fill #0

## 2024-05-12 ENCOUNTER — Ambulatory Visit

## 2024-05-12 DIAGNOSIS — E538 Deficiency of other specified B group vitamins: Secondary | ICD-10-CM

## 2024-05-12 MED ORDER — CYANOCOBALAMIN 1000 MCG/ML IJ SOLN
1000.0000 ug | Freq: Once | INTRAMUSCULAR | Status: AC
Start: 2024-05-12 — End: 2024-05-12
  Administered 2024-05-12: 1000 ug via INTRAMUSCULAR

## 2024-05-12 NOTE — Progress Notes (Signed)
 After obtaining consent, and per orders of Dr. Joshua, injection of B12 given by Ronnald SHAUNNA Palms. Patient instructed to report any adverse reaction to me immediately.

## 2024-05-13 ENCOUNTER — Ambulatory Visit: Admitting: Internal Medicine

## 2024-06-09 ENCOUNTER — Ambulatory Visit: Admitting: Internal Medicine

## 2024-06-09 ENCOUNTER — Ambulatory Visit (INDEPENDENT_AMBULATORY_CARE_PROVIDER_SITE_OTHER)

## 2024-06-09 ENCOUNTER — Other Ambulatory Visit (HOSPITAL_COMMUNITY): Payer: Self-pay

## 2024-06-09 ENCOUNTER — Ambulatory Visit: Payer: Self-pay | Admitting: Internal Medicine

## 2024-06-09 ENCOUNTER — Encounter: Payer: Self-pay | Admitting: Internal Medicine

## 2024-06-09 VITALS — BP 124/78 | HR 64 | Temp 98.7°F | Ht 69.0 in | Wt 155.2 lb

## 2024-06-09 DIAGNOSIS — J411 Mucopurulent chronic bronchitis: Secondary | ICD-10-CM | POA: Diagnosis not present

## 2024-06-09 DIAGNOSIS — Z23 Encounter for immunization: Secondary | ICD-10-CM | POA: Diagnosis not present

## 2024-06-09 DIAGNOSIS — R7303 Prediabetes: Secondary | ICD-10-CM | POA: Diagnosis not present

## 2024-06-09 DIAGNOSIS — D510 Vitamin B12 deficiency anemia due to intrinsic factor deficiency: Secondary | ICD-10-CM | POA: Diagnosis not present

## 2024-06-09 DIAGNOSIS — K8681 Exocrine pancreatic insufficiency: Secondary | ICD-10-CM

## 2024-06-09 DIAGNOSIS — I480 Paroxysmal atrial fibrillation: Secondary | ICD-10-CM

## 2024-06-09 DIAGNOSIS — E785 Hyperlipidemia, unspecified: Secondary | ICD-10-CM

## 2024-06-09 DIAGNOSIS — R109 Unspecified abdominal pain: Secondary | ICD-10-CM | POA: Diagnosis not present

## 2024-06-09 DIAGNOSIS — Z0000 Encounter for general adult medical examination without abnormal findings: Secondary | ICD-10-CM

## 2024-06-09 DIAGNOSIS — R1084 Generalized abdominal pain: Secondary | ICD-10-CM | POA: Diagnosis not present

## 2024-06-09 DIAGNOSIS — R972 Elevated prostate specific antigen [PSA]: Secondary | ICD-10-CM

## 2024-06-09 DIAGNOSIS — N1831 Chronic kidney disease, stage 3a: Secondary | ICD-10-CM | POA: Diagnosis not present

## 2024-06-09 DIAGNOSIS — J439 Emphysema, unspecified: Secondary | ICD-10-CM | POA: Diagnosis not present

## 2024-06-09 DIAGNOSIS — J431 Panlobular emphysema: Secondary | ICD-10-CM

## 2024-06-09 DIAGNOSIS — Z0001 Encounter for general adult medical examination with abnormal findings: Secondary | ICD-10-CM

## 2024-06-09 LAB — CBC WITH DIFFERENTIAL/PLATELET
Basophils Absolute: 0 K/uL (ref 0.0–0.1)
Basophils Relative: 0.5 % (ref 0.0–3.0)
Eosinophils Absolute: 0.1 K/uL (ref 0.0–0.7)
Eosinophils Relative: 1.5 % (ref 0.0–5.0)
HCT: 43.4 % (ref 39.0–52.0)
Hemoglobin: 14.3 g/dL (ref 13.0–17.0)
Lymphocytes Relative: 22.5 % (ref 12.0–46.0)
Lymphs Abs: 1.6 K/uL (ref 0.7–4.0)
MCHC: 33.1 g/dL (ref 30.0–36.0)
MCV: 96.7 fl (ref 78.0–100.0)
Monocytes Absolute: 0.5 K/uL (ref 0.1–1.0)
Monocytes Relative: 6.8 % (ref 3.0–12.0)
Neutro Abs: 5 K/uL (ref 1.4–7.7)
Neutrophils Relative %: 68.7 % (ref 43.0–77.0)
Platelets: 207 K/uL (ref 150.0–400.0)
RBC: 4.49 Mil/uL (ref 4.22–5.81)
RDW: 14.3 % (ref 11.5–15.5)
WBC: 7.3 K/uL (ref 4.0–10.5)

## 2024-06-09 LAB — URINALYSIS, ROUTINE W REFLEX MICROSCOPIC
Bilirubin Urine: NEGATIVE
Hgb urine dipstick: NEGATIVE
Ketones, ur: NEGATIVE
Leukocytes,Ua: NEGATIVE
Nitrite: NEGATIVE
Specific Gravity, Urine: 1.025 (ref 1.000–1.030)
Total Protein, Urine: NEGATIVE
Urine Glucose: NEGATIVE
Urobilinogen, UA: 0.2 (ref 0.0–1.0)
pH: 6.5 (ref 5.0–8.0)

## 2024-06-09 LAB — LIPID PANEL
Cholesterol: 139 mg/dL (ref 0–200)
HDL: 50.3 mg/dL (ref >39.00)
LDL Cholesterol: 68 mg/dL (ref 0–99)
NonHDL: 88.39
Total CHOL/HDL Ratio: 3
Triglycerides: 103 mg/dL (ref 0.0–149.0)
VLDL: 20.6 mg/dL (ref 0.0–40.0)

## 2024-06-09 LAB — BASIC METABOLIC PANEL WITH GFR
BUN: 25 mg/dL — ABNORMAL HIGH (ref 6–23)
CO2: 26 meq/L (ref 19–32)
Calcium: 8.9 mg/dL (ref 8.4–10.5)
Chloride: 107 meq/L (ref 96–112)
Creatinine, Ser: 0.96 mg/dL (ref 0.40–1.50)
GFR: 81.47 mL/min (ref >60.00)
Glucose, Bld: 122 mg/dL — ABNORMAL HIGH (ref 70–99)
Potassium: 4.3 meq/L (ref 3.5–5.1)
Sodium: 142 meq/L (ref 135–145)

## 2024-06-09 LAB — PSA: PSA: 3.47 ng/mL (ref 0.10–4.00)

## 2024-06-09 LAB — LIPASE: Lipase: 3 U/L — ABNORMAL LOW (ref 11.0–59.0)

## 2024-06-09 LAB — FOLATE: Folate: 13.1 ng/mL (ref >5.9)

## 2024-06-09 LAB — TSH: TSH: 1.38 u[IU]/mL (ref 0.35–5.50)

## 2024-06-09 LAB — HEMOGLOBIN A1C: Hgb A1c MFr Bld: 6.1 % (ref 4.6–6.5)

## 2024-06-09 MED ORDER — PANCRELIPASE (LIP-PROT-AMYL) 36000-114000 UNITS PO CPEP
36000.0000 [IU] | ORAL_CAPSULE | Freq: Three times a day (TID) | ORAL | 1 refills | Status: AC
  Filled 2024-06-09 – 2024-06-11 (×2): qty 200, 67d supply, fill #0

## 2024-06-09 MED ORDER — SPIRIVA RESPIMAT 1.25 MCG/ACT IN AERS
2.0000 | INHALATION_SPRAY | Freq: Every day | RESPIRATORY_TRACT | 5 refills | Status: AC
  Filled 2024-06-09 – 2024-08-05 (×6): qty 4, 30d supply, fill #0

## 2024-06-09 MED ORDER — CYANOCOBALAMIN 1000 MCG/ML IJ SOLN
1000.0000 ug | Freq: Once | INTRAMUSCULAR | Status: AC
  Administered 2024-06-09: 1000 ug via INTRAMUSCULAR

## 2024-06-09 NOTE — Patient Instructions (Signed)
 Abdominal Pain, Adult  Pain in the abdomen (abdominal pain) can be caused by many things. In most cases, it gets better with no treatment or by being treated at home. But in some cases, it can be serious. Your health care provider will ask questions about your medical history and do a physical exam to try to figure out what is causing your pain. Follow these instructions at home: Medicines Take over-the-counter and prescription medicines only as told by your provider. Do not take medicines that help you poop (laxatives) unless told by your provider. General instructions Watch your condition for any changes. Drink enough fluid to keep your pee (urine) pale yellow. Contact a health care provider if: Your pain changes, gets worse, or lasts longer than expected. You have severe cramping or bloating in your abdomen, or you vomit. Your pain gets worse with meals, after eating, or with certain foods. You are constipated or have diarrhea for more than 2-3 days. You are not hungry, or you lose weight without trying. You have signs of dehydration. These may include: Dark pee, very little pee, or no pee. Cracked lips or dry mouth. Sleepiness or weakness. You have pain when you pee (urinate) or poop. Your abdominal pain wakes you up at night. You have blood in your pee. You have a fever. Get help right away if: You cannot stop vomiting. Your pain is only in one part of the abdomen. Pain on the right side could be caused by appendicitis. You have bloody or black poop (stool), or poop that looks like tar. You have trouble breathing. You have chest pain. These symptoms may be an emergency. Get help right away. Call 911. Do not wait to see if the symptoms will go away. Do not drive yourself to the hospital. This information is not intended to replace advice given to you by your health care provider. Make sure you discuss any questions you have with your health care provider. Document Revised:  06/26/2022 Document Reviewed: 06/26/2022 Elsevier Patient Education  2024 [employer].

## 2024-06-09 NOTE — Progress Notes (Signed)
 "  Subjective:  Patient ID: Taylor Ochoa, male    DOB: 08/14/1956  Age: 68 y.o. MRN: [id number]  CC: Medical Management of Chronic Issues (6 month follow up./Abdominal pain some incontinence for about a week.  ), Annual Exam, and Abdominal Pain   HPI Taylor Ochoa presents for a CPX and f/up ----  Discussed the use of AI scribe software for clinical note transcription with the patient, who gave verbal consent to proceed.  History of Present Illness Taylor Ochoa is a 68 year old male who presents with abdominal pain and diarrhea.  He has been experiencing intermittent abdominal pain for the past week, described as throbbing at times. He has been taking Motrin , approximately three tablets a day.  He has diarrhea without blood or black, tarry stools. No constipation. His bowel movements are frequent enough to cause incontinence.  No heartburn or indigestion, although he mentions taking heartburn medication. No trouble swallowing. His weight and appetite remain stable.  No fever, chills, or shortness of breath. He reports a little coughing, which is not a new symptom for him. He has been coughing up phlegm but denies any blood in the phlegm.     Outpatient Medications Prior to Visit  Medication Sig Dispense Refill   Cholecalciferol  50 MCG (2000 UT) TABS Take 1 tablet (2,000 Units total) by mouth daily. 90 tablet 1   feeding supplement (ENSURE ENLIVE / ENSURE PLUS) LIQD Take 237 mLs by mouth 2 (two) times daily between meals. 237 mL 12   pantoprazole  (PROTONIX ) 40 MG tablet Take 1 tablet (40 mg total) by mouth daily. 90 tablet 0   rosuvastatin  (CRESTOR ) 20 MG tablet Take 1 tablet (20 mg total) by mouth daily. 90 tablet 0   sertraline  (ZOLOFT ) 100 MG tablet Take 1 tablet (100 mg total) by mouth daily. 90 tablet 0   bisacodyl  (DULCOLAX) 10 MG suppository Place 1 suppository (10 mg total) rectally daily as needed for moderate constipation. 12 suppository 0   cyanocobalamin  2000 MCG  tablet Take 1 tablet (2,000 mcg total) by mouth daily. 90 tablet 1   docusate sodium  (COLACE) 100 MG capsule Take 1 capsule (100 mg total) by mouth 2 (two) times daily as directed 100 capsule 0   gabapentin  (NEURONTIN ) 600 MG tablet Take 1 tablet (600 mg total) by mouth 3 (three) times daily. 270 tablet 0   hydrOXYzine  (ATARAX ) 10 MG tablet Take 1 tablet (10 mg total) by mouth every 8 (eight) hours as needed. 30 tablet 0   methocarbamol  (ROBAXIN ) 500 MG tablet Take 1 tablet (500 mg total) by mouth every 6 (six) hours as needed for muscle spasms. 40 tablet 0   Multiple Vitamin (MULTIVITAMIN WITH MINERALS) TABS tablet Take 1 tablet by mouth daily.     polyethylene glycol (MIRALAX  / GLYCOLAX ) 17 g packet Take 17 g by mouth 2 (two) times daily. 14 each 0   thiamine  (VITAMIN B1) 100 MG tablet Take 1 tablet (100 mg total) by mouth daily. 90 tablet 0   Tiotropium Bromide  Monohydrate (SPIRIVA  RESPIMAT) 1.25 MCG/ACT AERS Inhale 2 puffs into the lungs daily. 4 g 5   traMADol  (ULTRAM ) 50 MG tablet Take 1 tablet (50 mg total) by mouth every 6 (six) hours as needed for severe pain. (Patient not taking: Reported on 02/20/2024) 30 tablet 0   No facility-administered medications prior to visit.    ROS Review of Systems  Constitutional:  Negative for appetite change, chills, diaphoresis, fatigue and unexpected weight change.  HENT: Negative.  Negative for trouble swallowing.   Eyes: Negative.   Respiratory:  Positive for cough. Negative for chest tightness, shortness of breath and wheezing.   Cardiovascular:  Negative for chest pain, palpitations and leg swelling.  Gastrointestinal:  Positive for abdominal pain and diarrhea. Negative for abdominal distention, anal bleeding, blood in stool, constipation, nausea, rectal pain and vomiting.  Endocrine: Negative.   Genitourinary: Negative.  Negative for difficulty urinating and hematuria.  Musculoskeletal: Negative.  Negative for arthralgias, joint swelling and  myalgias.  Skin: Negative.   Neurological:  Negative for dizziness, weakness and headaches.  Hematological:  Negative for adenopathy. Does not bruise/bleed easily.  Psychiatric/Behavioral:  Positive for decreased concentration and dysphoric mood. Negative for agitation and sleep disturbance. The patient is not nervous/anxious.     Objective:  BP 124/78 (BP Location: Left Arm, Patient Position: Sitting, Cuff Size: Small)   Pulse 64   Temp 98.7 F (37.1 C) (Oral)   Ht 5' 9 (1.753 m)   Wt 155 lb 3.2 oz (70.4 kg)   SpO2 95%   BMI 22.92 kg/m   BP Readings from Last 3 Encounters:  06/09/24 124/78  04/02/24 136/83  03/12/24 126/87    Wt Readings from Last 3 Encounters:  06/09/24 155 lb 3.2 oz (70.4 kg)  04/02/24 152 lb (68.9 kg)  03/12/24 152 lb (68.9 kg)    Physical Exam Vitals reviewed.  Constitutional:      Appearance: He is well-developed.  HENT:     Mouth/Throat:     Mouth: Mucous membranes are moist.  Eyes:     General: No scleral icterus.    Conjunctiva/sclera: Conjunctivae normal.  Cardiovascular:     Rate and Rhythm: Normal rate and regular rhythm.     Heart sounds: Normal heart sounds, S1 normal and S2 normal. No murmur heard.    Comments: EKG- NSR, 63 bpm No LVH, Q waves, or ST/T wave changes  Pulmonary:     Effort: Pulmonary effort is normal.     Breath sounds: No stridor. No wheezing, rhonchi or rales.  Abdominal:     General: Abdomen is flat. Bowel sounds are normal. There is no distension or abdominal bruit.     Palpations: There is no hepatomegaly, splenomegaly or mass.     Tenderness: There is no abdominal tenderness. There is no guarding or rebound.     Hernia: A hernia is present. Hernia is present in the left inguinal area. There is no hernia in the right inguinal area.  Genitourinary:    Pubic Area: No rash.      Penis: Normal and circumcised.      Testes: Normal.     Epididymis:     Right: Normal.     Left: Normal.  Musculoskeletal:         General: Normal range of motion.     Cervical back: Neck supple.     Right lower leg: No edema.     Left lower leg: No edema.  Lymphadenopathy:     Cervical: No cervical adenopathy.     Lower Body: No right inguinal adenopathy. No left inguinal adenopathy.  Skin:    General: Skin is warm and dry.     Coloration: Skin is not pale.     Findings: No rash.  Neurological:     General: No focal deficit present.     Mental Status: He is alert.  Psychiatric:        Mood and Affect: Mood normal.  Behavior: Behavior normal.     Lab Results  Component Value Date   WBC 7.3 06/09/2024   HGB 14.3 06/09/2024   HCT 43.4 06/09/2024   PLT 207.0 06/09/2024   GLUCOSE 122 (H) 06/09/2024   CHOL 139 06/09/2024   TRIG 103.0 06/09/2024   HDL 50.30 06/09/2024   LDLCALC 68 06/09/2024   ALT 13 04/02/2024   AST 17 04/02/2024   NA 142 06/09/2024   K 4.3 06/09/2024   CL 107 06/09/2024   CREATININE 0.96 06/09/2024   BUN 25 (H) 06/09/2024   CO2 26 06/09/2024   TSH 1.38 06/09/2024   PSA 3.47 06/09/2024   INR 1.0 03/27/2023   HGBA1C 6.1 06/09/2024    CT Head Wo Contrast Result Date: 04/02/2024 CLINICAL DATA:  Trauma, fall in grocery store.  Abrasion on nose. EXAM: CT HEAD WITHOUT CONTRAST CT MAXILLOFACIAL WITHOUT CONTRAST CT CERVICAL SPINE WITHOUT CONTRAST TECHNIQUE: Multidetector CT imaging of the head, cervical spine, and maxillofacial structures were performed using the standard protocol without intravenous contrast. Multiplanar CT image reconstructions of the cervical spine and maxillofacial structures were also generated. RADIATION DOSE REDUCTION: This exam was performed according to the departmental dose-optimization program which includes automated exposure control, adjustment of the mA and/or kV according to patient size and/or use of iterative reconstruction technique. COMPARISON:  CT head and maxillofacial 07/14/2022. MRI cervical spine 03/01/2020. FINDINGS: CT HEAD FINDINGS Brain: No  acute intracranial hemorrhage. No CT evidence of completed large territory infarct. Nonspecific hypoattenuation in the periventricular and subcortical white matter favored to reflect chronic microvascular ischemic changes. Prominent perivascular space versus remote lacunar infarct in the inferior aspect of the left lentiform nucleus. No edema, mass effect, or midline shift. The basilar cisterns are patent. Retrocerebellar cystic focus suggestive of mega cisterna magna versus arachnoid cyst. Ventricles: Similar appearance of ventriculomegaly. Borderline callosum angle measuring approximately 95 degrees. Evans index measures approximately 0.4. Vascular: Atherosclerotic calcifications of the carotid siphons. No hyperdense vessel. Skull: No acute or aggressive finding. Other: Trace fluid in the right mastoid air cells. CT MAXILLOFACIAL FINDINGS Osseous: Maxilla: Intact. Nearly edentulous maxilla with associated chronic bone loss. Pterygoid Plates: Intact Zygomatic Arch: Intact Orbits: Intact Ethmoid: Intact Sphenoid: Intact Frontal:Intact Mandible: Intact. Nearly edentulous mandible with associated chronic bone loss. No condylar dislocation. Degenerative changes of the mandibular condyles, greater on the left. Nasal: Nondisplaced bilateral nasal bone deformities. Nasal Septum: Intact. No significant deviation. Orbits: Globes are intact. Bilateral lens replacement. The extraocular muscles and optic nerve sheath complexes are unremarkable. Normal appearance of the intraorbital fat. Sinuses: Mild mucosal thickening in the ethmoid sinuses. No air-fluid levels. Soft tissues: Mild soft tissue swelling over the bilateral nasal bones. CT CERVICAL SPINE FINDINGS Alignment: Straightening and slight reversal of the normal cervical lordosis. No significant listhesis. No facet subluxation or dislocation. Skull base and vertebrae: No acute fracture. No primary bone lesion or focal pathologic process. Soft tissues and spinal canal: No  prevertebral fluid or swelling. No visible canal hematoma. Disc levels: Intervertebral disc space narrowing greatest at C4-5 and C5-6. Small disc bulges at multiple levels. Disc osteophyte complex at C5-6 eccentric to the right resulting in mild spinal canal stenosis. Bilateral facet arthrosis and uncovertebral hypertrophy at multiple levels. There is foraminal narrowing most pronounced on the right at C2-3 and on the left at C3-4 and bilaterally at C5-6. Upper chest: Biapical pleuroparenchymal scarring. Other: None. IMPRESSION: No CT evidence of acute intracranial abnormality. No acute fracture or traumatic malalignment of the cervical spine. Nondisplaced bilateral nasal  bone fractures with overlying soft tissue swelling. Similar appearance of ventriculomegaly. Chronic and degenerative changes as above. Electronically Signed   By: Donnice Mania M.D.   On: 04/02/2024 16:54   CT Cervical Spine Wo Contrast Result Date: 04/02/2024 CLINICAL DATA:  Trauma, fall in grocery store.  Abrasion on nose. EXAM: CT HEAD WITHOUT CONTRAST CT MAXILLOFACIAL WITHOUT CONTRAST CT CERVICAL SPINE WITHOUT CONTRAST TECHNIQUE: Multidetector CT imaging of the head, cervical spine, and maxillofacial structures were performed using the standard protocol without intravenous contrast. Multiplanar CT image reconstructions of the cervical spine and maxillofacial structures were also generated. RADIATION DOSE REDUCTION: This exam was performed according to the departmental dose-optimization program which includes automated exposure control, adjustment of the mA and/or kV according to patient size and/or use of iterative reconstruction technique. COMPARISON:  CT head and maxillofacial 07/14/2022. MRI cervical spine 03/01/2020. FINDINGS: CT HEAD FINDINGS Brain: No acute intracranial hemorrhage. No CT evidence of completed large territory infarct. Nonspecific hypoattenuation in the periventricular and subcortical white matter favored to reflect  chronic microvascular ischemic changes. Prominent perivascular space versus remote lacunar infarct in the inferior aspect of the left lentiform nucleus. No edema, mass effect, or midline shift. The basilar cisterns are patent. Retrocerebellar cystic focus suggestive of mega cisterna magna versus arachnoid cyst. Ventricles: Similar appearance of ventriculomegaly. Borderline callosum angle measuring approximately 95 degrees. Evans index measures approximately 0.4. Vascular: Atherosclerotic calcifications of the carotid siphons. No hyperdense vessel. Skull: No acute or aggressive finding. Other: Trace fluid in the right mastoid air cells. CT MAXILLOFACIAL FINDINGS Osseous: Maxilla: Intact. Nearly edentulous maxilla with associated chronic bone loss. Pterygoid Plates: Intact Zygomatic Arch: Intact Orbits: Intact Ethmoid: Intact Sphenoid: Intact Frontal:Intact Mandible: Intact. Nearly edentulous mandible with associated chronic bone loss. No condylar dislocation. Degenerative changes of the mandibular condyles, greater on the left. Nasal: Nondisplaced bilateral nasal bone deformities. Nasal Septum: Intact. No significant deviation. Orbits: Globes are intact. Bilateral lens replacement. The extraocular muscles and optic nerve sheath complexes are unremarkable. Normal appearance of the intraorbital fat. Sinuses: Mild mucosal thickening in the ethmoid sinuses. No air-fluid levels. Soft tissues: Mild soft tissue swelling over the bilateral nasal bones. CT CERVICAL SPINE FINDINGS Alignment: Straightening and slight reversal of the normal cervical lordosis. No significant listhesis. No facet subluxation or dislocation. Skull base and vertebrae: No acute fracture. No primary bone lesion or focal pathologic process. Soft tissues and spinal canal: No prevertebral fluid or swelling. No visible canal hematoma. Disc levels: Intervertebral disc space narrowing greatest at C4-5 and C5-6. Small disc bulges at multiple levels. Disc  osteophyte complex at C5-6 eccentric to the right resulting in mild spinal canal stenosis. Bilateral facet arthrosis and uncovertebral hypertrophy at multiple levels. There is foraminal narrowing most pronounced on the right at C2-3 and on the left at C3-4 and bilaterally at C5-6. Upper chest: Biapical pleuroparenchymal scarring. Other: None. IMPRESSION: No CT evidence of acute intracranial abnormality. No acute fracture or traumatic malalignment of the cervical spine. Nondisplaced bilateral nasal bone fractures with overlying soft tissue swelling. Similar appearance of ventriculomegaly. Chronic and degenerative changes as above. Electronically Signed   By: Donnice Mania M.D.   On: 04/02/2024 16:54   CT Maxillofacial Wo Contrast Result Date: 04/02/2024 CLINICAL DATA:  Trauma, fall in grocery store.  Abrasion on nose. EXAM: CT HEAD WITHOUT CONTRAST CT MAXILLOFACIAL WITHOUT CONTRAST CT CERVICAL SPINE WITHOUT CONTRAST TECHNIQUE: Multidetector CT imaging of the head, cervical spine, and maxillofacial structures were performed using the standard protocol without intravenous contrast. Multiplanar CT  image reconstructions of the cervical spine and maxillofacial structures were also generated. RADIATION DOSE REDUCTION: This exam was performed according to the departmental dose-optimization program which includes automated exposure control, adjustment of the mA and/or kV according to patient size and/or use of iterative reconstruction technique. COMPARISON:  CT head and maxillofacial 07/14/2022. MRI cervical spine 03/01/2020. FINDINGS: CT HEAD FINDINGS Brain: No acute intracranial hemorrhage. No CT evidence of completed large territory infarct. Nonspecific hypoattenuation in the periventricular and subcortical white matter favored to reflect chronic microvascular ischemic changes. Prominent perivascular space versus remote lacunar infarct in the inferior aspect of the left lentiform nucleus. No edema, mass effect, or  midline shift. The basilar cisterns are patent. Retrocerebellar cystic focus suggestive of mega cisterna magna versus arachnoid cyst. Ventricles: Similar appearance of ventriculomegaly. Borderline callosum angle measuring approximately 95 degrees. Evans index measures approximately 0.4. Vascular: Atherosclerotic calcifications of the carotid siphons. No hyperdense vessel. Skull: No acute or aggressive finding. Other: Trace fluid in the right mastoid air cells. CT MAXILLOFACIAL FINDINGS Osseous: Maxilla: Intact. Nearly edentulous maxilla with associated chronic bone loss. Pterygoid Plates: Intact Zygomatic Arch: Intact Orbits: Intact Ethmoid: Intact Sphenoid: Intact Frontal:Intact Mandible: Intact. Nearly edentulous mandible with associated chronic bone loss. No condylar dislocation. Degenerative changes of the mandibular condyles, greater on the left. Nasal: Nondisplaced bilateral nasal bone deformities. Nasal Septum: Intact. No significant deviation. Orbits: Globes are intact. Bilateral lens replacement. The extraocular muscles and optic nerve sheath complexes are unremarkable. Normal appearance of the intraorbital fat. Sinuses: Mild mucosal thickening in the ethmoid sinuses. No air-fluid levels. Soft tissues: Mild soft tissue swelling over the bilateral nasal bones. CT CERVICAL SPINE FINDINGS Alignment: Straightening and slight reversal of the normal cervical lordosis. No significant listhesis. No facet subluxation or dislocation. Skull base and vertebrae: No acute fracture. No primary bone lesion or focal pathologic process. Soft tissues and spinal canal: No prevertebral fluid or swelling. No visible canal hematoma. Disc levels: Intervertebral disc space narrowing greatest at C4-5 and C5-6. Small disc bulges at multiple levels. Disc osteophyte complex at C5-6 eccentric to the right resulting in mild spinal canal stenosis. Bilateral facet arthrosis and uncovertebral hypertrophy at multiple levels. There is  foraminal narrowing most pronounced on the right at C2-3 and on the left at C3-4 and bilaterally at C5-6. Upper chest: Biapical pleuroparenchymal scarring. Other: None. IMPRESSION: No CT evidence of acute intracranial abnormality. No acute fracture or traumatic malalignment of the cervical spine. Nondisplaced bilateral nasal bone fractures with overlying soft tissue swelling. Similar appearance of ventriculomegaly. Chronic and degenerative changes as above. Electronically Signed   By: Donnice Mania M.D.   On: 04/02/2024 16:54   DG Shoulder Right Result Date: 04/02/2024 CLINICAL DATA:  Right shoulder and upper arm pain following a fall. EXAM: RIGHT SHOULDER - 2+ VIEW COMPARISON:  None Available. FINDINGS: There is no evidence of fracture or dislocation. There is no evidence of arthropathy or other focal bone abnormality. Soft tissues are unremarkable. IMPRESSION: Negative. Electronically Signed   By: Elspeth Bathe M.D.   On: 04/02/2024 16:41    DG ABD ACUTE 2+V W 1V CHEST Result Date: 06/09/2024 CLINICAL DATA:  Abdominal pain for 1 week. EXAM: DG ABDOMEN ACUTE WITH 1 VIEW CHEST COMPARISON:  Chest radiograph dated 03/12/2024. FINDINGS: Nonobstructive bowel gas pattern. Scattered gas and stool throughout the colon. No evidence of free intraperitoneal air. No radiopaque calculi noted. Heart size and mediastinal contours are within normal limits. No focal consolidation. Emphysema. Postoperative changes related to prior right total hip  arthroplasty and fixation screws extending across the right SI joint and right superior pubic ramus. IMPRESSION: 1. Nonobstructive bowel gas pattern. Scattered gas and stool throughout the colon. 2. No acute cardiopulmonary findings.  Emphysema. Electronically Signed   By: Harrietta Sherry M.D.   On: 06/09/2024 09:48     Assessment & Plan:  Prediabetes -     Basic metabolic panel with GFR; Future -     Hemoglobin A1c; Future  Mucopurulent chronic bronchitis (HCC) -      Spiriva  Respimat; Inhale 2 puffs into the lungs daily.  Dispense: 4 g; Refill: 5  Vitamin B12 deficiency anemia due to intrinsic factor deficiency -     Folate; Future -     CBC with Differential/Platelet; Future -     Cyanocobalamin   Generalized abdominal pain- Exam, xray, labs are reassuring. -     DG ABD ACUTE 2+V W 1V CHEST; Future -     Lipase; Future -     CBC with Differential/Platelet; Future  Panlobular emphysema (HCC)  PAF (paroxysmal atrial fibrillation) (HCC)- He has good R/R control. -     TSH; Future -     EKG 12-Lead  High prostate specific antigen (PSA) -     PSA; Future  Hyperlipidemia with target LDL less than 130 -     Lipid panel; Future  Need for immunization against influenza -     Flu vaccine HIGH DOSE PF(Fluzone Trivalent)  Exocrine pancreatic insufficiency -     Pancrelipase  (Lip-Prot-Amyl); Take 1 capsule (36,000 Units total) by mouth 3 (three) times daily with meals.  Dispense: 200 capsule; Refill: 1     Follow-up: Return in about 3 months (around 09/08/2024).  Debby Molt, MD "

## 2024-06-11 ENCOUNTER — Other Ambulatory Visit (HOSPITAL_COMMUNITY): Payer: Self-pay

## 2024-06-11 ENCOUNTER — Encounter: Payer: Self-pay | Admitting: Internal Medicine

## 2024-06-11 ENCOUNTER — Other Ambulatory Visit: Payer: Self-pay

## 2024-06-11 NOTE — Assessment & Plan Note (Signed)
Exam completed, labs reviewed, vaccines reviewed and updated, cancer screenings addressed, pt ed material was given.

## 2024-06-12 ENCOUNTER — Encounter: Payer: Self-pay | Admitting: Internal Medicine

## 2024-06-22 ENCOUNTER — Other Ambulatory Visit: Payer: Self-pay

## 2024-07-07 ENCOUNTER — Ambulatory Visit

## 2024-07-07 DIAGNOSIS — D510 Vitamin B12 deficiency anemia due to intrinsic factor deficiency: Secondary | ICD-10-CM

## 2024-07-07 MED ORDER — CYANOCOBALAMIN 1000 MCG/ML IJ SOLN
1000.0000 ug | Freq: Once | INTRAMUSCULAR | Status: AC
  Administered 2024-07-07: 1000 ug via INTRAMUSCULAR

## 2024-07-07 NOTE — Progress Notes (Signed)
 After obtaining consent, and per orders of Dr. Joshua, injection of B12 given by Edsel CHRISTELLA Kerns. Patient tolerated well.

## 2024-07-21 ENCOUNTER — Other Ambulatory Visit: Payer: Self-pay

## 2024-07-21 DIAGNOSIS — Z87891 Personal history of nicotine dependence: Secondary | ICD-10-CM

## 2024-07-21 DIAGNOSIS — F17210 Nicotine dependence, cigarettes, uncomplicated: Secondary | ICD-10-CM

## 2024-07-21 DIAGNOSIS — Z122 Encounter for screening for malignant neoplasm of respiratory organs: Secondary | ICD-10-CM

## 2024-07-30 ENCOUNTER — Other Ambulatory Visit: Payer: Self-pay

## 2024-07-30 ENCOUNTER — Encounter: Payer: Self-pay | Admitting: Internal Medicine

## 2024-07-30 ENCOUNTER — Other Ambulatory Visit: Payer: Self-pay | Admitting: Internal Medicine

## 2024-07-30 DIAGNOSIS — F411 Generalized anxiety disorder: Secondary | ICD-10-CM

## 2024-07-30 DIAGNOSIS — K219 Gastro-esophageal reflux disease without esophagitis: Secondary | ICD-10-CM

## 2024-07-30 DIAGNOSIS — J411 Mucopurulent chronic bronchitis: Secondary | ICD-10-CM

## 2024-07-30 DIAGNOSIS — E785 Hyperlipidemia, unspecified: Secondary | ICD-10-CM

## 2024-08-02 ENCOUNTER — Other Ambulatory Visit: Payer: Self-pay

## 2024-08-02 MED FILL — Pantoprazole Sodium EC Tab 40 MG (Base Equiv): ORAL | 90 days supply | Qty: 90 | Fill #0 | Status: AC

## 2024-08-02 MED FILL — Sertraline HCl Tab 100 MG: ORAL | 90 days supply | Qty: 90 | Fill #0 | Status: AC

## 2024-08-02 MED FILL — Rosuvastatin Calcium Tab 20 MG: ORAL | 90 days supply | Qty: 90 | Fill #0 | Status: AC

## 2024-08-05 ENCOUNTER — Other Ambulatory Visit: Payer: Self-pay

## 2024-08-06 ENCOUNTER — Other Ambulatory Visit: Payer: Self-pay

## 2024-08-06 ENCOUNTER — Encounter: Payer: Self-pay | Admitting: Internal Medicine

## 2024-08-09 ENCOUNTER — Other Ambulatory Visit: Payer: Self-pay

## 2024-08-25 ENCOUNTER — Ambulatory Visit

## 2024-08-25 DIAGNOSIS — E538 Deficiency of other specified B group vitamins: Secondary | ICD-10-CM

## 2024-08-25 MED ORDER — CYANOCOBALAMIN 1000 MCG/ML IJ SOLN
1000.0000 ug | Freq: Once | INTRAMUSCULAR | Status: AC
  Administered 2024-08-25: 1000 ug via INTRAMUSCULAR

## 2024-08-25 NOTE — Progress Notes (Signed)
 B12 injection given w/o complications

## 2024-09-06 ENCOUNTER — Other Ambulatory Visit (HOSPITAL_COMMUNITY): Payer: Self-pay

## 2024-09-06 ENCOUNTER — Ambulatory Visit (INDEPENDENT_AMBULATORY_CARE_PROVIDER_SITE_OTHER)

## 2024-09-06 ENCOUNTER — Encounter: Payer: Self-pay | Admitting: Internal Medicine

## 2024-09-06 ENCOUNTER — Ambulatory Visit: Admitting: Internal Medicine

## 2024-09-06 VITALS — BP 120/60 | HR 77 | Temp 98.9°F | Ht 69.0 in | Wt 160.0 lb

## 2024-09-06 DIAGNOSIS — M8580 Other specified disorders of bone density and structure, unspecified site: Secondary | ICD-10-CM | POA: Diagnosis not present

## 2024-09-06 DIAGNOSIS — Z043 Encounter for examination and observation following other accident: Secondary | ICD-10-CM | POA: Diagnosis not present

## 2024-09-06 DIAGNOSIS — J411 Mucopurulent chronic bronchitis: Secondary | ICD-10-CM

## 2024-09-06 DIAGNOSIS — M545 Low back pain, unspecified: Secondary | ICD-10-CM | POA: Diagnosis not present

## 2024-09-06 DIAGNOSIS — S72001A Fracture of unspecified part of neck of right femur, initial encounter for closed fracture: Secondary | ICD-10-CM | POA: Diagnosis not present

## 2024-09-06 DIAGNOSIS — J431 Panlobular emphysema: Secondary | ICD-10-CM | POA: Diagnosis not present

## 2024-09-06 DIAGNOSIS — S32501A Unspecified fracture of right pubis, initial encounter for closed fracture: Secondary | ICD-10-CM | POA: Diagnosis not present

## 2024-09-06 DIAGNOSIS — W19XXXA Unspecified fall, initial encounter: Secondary | ICD-10-CM

## 2024-09-06 DIAGNOSIS — M25551 Pain in right hip: Secondary | ICD-10-CM | POA: Diagnosis not present

## 2024-09-06 DIAGNOSIS — I480 Paroxysmal atrial fibrillation: Secondary | ICD-10-CM

## 2024-09-06 DIAGNOSIS — I700 Atherosclerosis of aorta: Secondary | ICD-10-CM | POA: Diagnosis not present

## 2024-09-06 MED ORDER — COVID-19 MRNA VAC-TRIS(PFIZER) 30 MCG/0.3ML IM SUSY
0.3000 mL | PREFILLED_SYRINGE | Freq: Once | INTRAMUSCULAR | 0 refills | Status: DC
  Filled 2024-09-06: qty 0.3, 1d supply, fill #0

## 2024-09-06 MED ORDER — SHINGRIX 50 MCG/0.5ML IM SUSR
0.5000 mL | Freq: Once | INTRAMUSCULAR | 1 refills | Status: DC
  Filled 2024-09-06: qty 0.5, 1d supply, fill #0

## 2024-09-06 NOTE — Progress Notes (Signed)
 Subjective:  Patient ID: Taylor Ochoa, male    DOB: [birth year]  Age: 68 y.o. MRN: [id number]  CC: Prediabetes (3 month follow up ), Back Pain (Lower back pain for about 3 weeks. ), Balance Issues (Unsteady for about 3 days. Today in the office he almost fell twice walking to his room from the waiting room ), and Fall (Last night. When he was trying to get the dog to come up the deck he states that his foot slip and he fell. )   HPI Taylor Ochoa presents for f/up ---  Discussed the use of AI scribe software for clinical note transcription with the patient, who gave verbal consent to proceed.  History of Present Illness Taylor Ochoa is a 68 year old male who presents with lower back pain and balance issues after a fall.  He has been experiencing lower back pain that spans across the lower part of his back, described as being present 'all the way across'. There is no specific incident identified as the cause, and he is unsure if he has had any recent imaging studies for his back. Current medication use is not specified.  He mentions a recent fall caused by tripping over his dog, during which he landed on his knees. No knee pain is reported following the fall, and he can walk and bear weight on both knees. However, he feels off balance, describing his movement as 'like a bouncy ball'.  He notes discomfort in his right hip, which he associates with a previous operation. Despite this, he states that his back pain is more bothersome than his hip discomfort.  No additional symptoms beyond his back pain and balance issues.     Outpatient Medications Prior to Visit  Medication Sig Dispense Refill   Cholecalciferol  50 MCG (2000 UT) TABS Take 1 tablet (2,000 Units total) by mouth daily. 90 tablet 1   feeding supplement (ENSURE ENLIVE / ENSURE PLUS) LIQD Take 237 mLs by mouth 2 (two) times daily between meals. 237 mL 12   lipase/protease/amylase (CREON ) 36000 UNITS CPEP capsule Take 1  capsule (36,000 Units total) by mouth 3 (three) times daily with meals. 200 capsule 1   pantoprazole  (PROTONIX ) 40 MG tablet Take 1 tablet (40 mg total) by mouth daily. 90 tablet 0   rosuvastatin  (CRESTOR ) 20 MG tablet Take 1 tablet (20 mg total) by mouth daily. 90 tablet 0   sertraline  (ZOLOFT ) 100 MG tablet Take 1 tablet (100 mg total) by mouth daily. 90 tablet 0   Tiotropium Bromide  (SPIRIVA  RESPIMAT) 1.25 MCG/ACT AERS Inhale 2 puffs into the lungs daily. 4 g 5   No facility-administered medications prior to visit.    ROS Review of Systems  Constitutional:  Negative for appetite change, chills, diaphoresis, fatigue and fever.  HENT: Negative.    Eyes: Negative.  Negative for visual disturbance.  Respiratory:  Negative for cough, chest tightness, shortness of breath, wheezing and stridor.   Cardiovascular:  Negative for chest pain, palpitations and leg swelling.  Gastrointestinal: Negative.  Negative for abdominal pain, constipation, diarrhea, nausea and vomiting.  Endocrine: Negative.   Genitourinary: Negative.  Negative for difficulty urinating and dysuria.  Musculoskeletal:  Positive for arthralgias, back pain and gait problem.  Skin: Negative.  Negative for color change and pallor.  Neurological:  Positive for dizziness and light-headedness. Negative for speech difficulty, weakness, numbness and headaches.  Hematological:  Negative for adenopathy. Does not bruise/bleed easily.  Psychiatric/Behavioral:  Positive for  confusion and decreased concentration.     Objective:  BP 120/60 (BP Location: Left Arm, Patient Position: Sitting, Cuff Size: Normal)   Pulse 77   Temp 98.9 F (37.2 C) (Oral)   Ht 5' 9 (1.753 m)   Wt 160 lb (72.6 kg)   SpO2 97%   BMI 23.63 kg/m   BP Readings from Last 3 Encounters:  09/06/24 120/60  06/09/24 124/78  04/02/24 136/83    Wt Readings from Last 3 Encounters:  09/06/24 160 lb (72.6 kg)  06/09/24 155 lb 3.2 oz (70.4 kg)  04/02/24 152 lb  (68.9 kg)    Physical Exam Vitals reviewed.  Constitutional:      Appearance: Normal appearance.  HENT:     Nose: Nose normal.     Mouth/Throat:     Mouth: Mucous membranes are moist.  Eyes:     General: No scleral icterus.    Conjunctiva/sclera: Conjunctivae normal.  Cardiovascular:     Rate and Rhythm: Normal rate and regular rhythm.     Heart sounds: No murmur heard.    No friction rub. No gallop.  Pulmonary:     Effort: Pulmonary effort is normal.     Breath sounds: No stridor. No wheezing, rhonchi or rales.  Abdominal:     General: Abdomen is flat.     Palpations: There is no mass.     Tenderness: There is no abdominal tenderness. There is no guarding.     Hernia: No hernia is present.  Musculoskeletal:        General: Normal range of motion.     Cervical back: Neck supple.     Lumbar back: No tenderness or bony tenderness. Normal range of motion. Negative right straight leg raise test and negative left straight leg raise test.     Right knee: Normal. No swelling, deformity, effusion, bony tenderness or crepitus. Normal range of motion. No tenderness.     Left knee: Normal. No swelling, deformity, effusion, bony tenderness or crepitus. Normal range of motion. No tenderness.     Right lower leg: No edema.     Left lower leg: No edema.  Lymphadenopathy:     Cervical: No cervical adenopathy.  Skin:    General: Skin is warm and dry.  Neurological:     General: No focal deficit present.     Mental Status: He is alert. Mental status is at baseline.     Motor: Weakness and atrophy present.     Coordination: Romberg sign positive. Coordination abnormal.     Gait: Gait abnormal.  Psychiatric:        Attention and Perception: He is inattentive.        Mood and Affect: Mood normal.        Speech: Speech is delayed and tangential.        Behavior: Behavior normal. Behavior is cooperative.        Thought Content: Thought content normal.        Cognition and Memory: Cognition  is impaired. Memory is impaired.     Lab Results  Component Value Date   WBC 7.3 06/09/2024   HGB 14.3 06/09/2024   HCT 43.4 06/09/2024   PLT 207.0 06/09/2024   GLUCOSE 122 (H) 06/09/2024   CHOL 139 06/09/2024   TRIG 103.0 06/09/2024   HDL 50.30 06/09/2024   LDLCALC 68 06/09/2024   ALT 13 04/02/2024   AST 17 04/02/2024   NA 142 06/09/2024   K 4.3 06/09/2024   CL  107 06/09/2024   CREATININE 0.96 06/09/2024   BUN 25 (H) 06/09/2024   CO2 26 06/09/2024   TSH 1.38 06/09/2024   PSA 3.47 06/09/2024   INR 1.0 03/27/2023   HGBA1C 6.1 06/09/2024    CT Head Wo Contrast Result Date: 04/02/2024 CLINICAL DATA:  Trauma, fall in grocery store.  Abrasion on nose. EXAM: CT HEAD WITHOUT CONTRAST CT MAXILLOFACIAL WITHOUT CONTRAST CT CERVICAL SPINE WITHOUT CONTRAST TECHNIQUE: Multidetector CT imaging of the head, cervical spine, and maxillofacial structures were performed using the standard protocol without intravenous contrast. Multiplanar CT image reconstructions of the cervical spine and maxillofacial structures were also generated. RADIATION DOSE REDUCTION: This exam was performed according to the departmental dose-optimization program which includes automated exposure control, adjustment of the mA and/or kV according to patient size and/or use of iterative reconstruction technique. COMPARISON:  CT head and maxillofacial 07/14/2022. MRI cervical spine 03/01/2020. FINDINGS: CT HEAD FINDINGS Brain: No acute intracranial hemorrhage. No CT evidence of completed large territory infarct. Nonspecific hypoattenuation in the periventricular and subcortical white matter favored to reflect chronic microvascular ischemic changes. Prominent perivascular space versus remote lacunar infarct in the inferior aspect of the left lentiform nucleus. No edema, mass effect, or midline shift. The basilar cisterns are patent. Retrocerebellar cystic focus suggestive of mega cisterna magna versus arachnoid cyst. Ventricles:  Similar appearance of ventriculomegaly. Borderline callosum angle measuring approximately 95 degrees. Evans index measures approximately 0.4. Vascular: Atherosclerotic calcifications of the carotid siphons. No hyperdense vessel. Skull: No acute or aggressive finding. Other: Trace fluid in the right mastoid air cells. CT MAXILLOFACIAL FINDINGS Osseous: Maxilla: Intact. Nearly edentulous maxilla with associated chronic bone loss. Pterygoid Plates: Intact Zygomatic Arch: Intact Orbits: Intact Ethmoid: Intact Sphenoid: Intact Frontal:Intact Mandible: Intact. Nearly edentulous mandible with associated chronic bone loss. No condylar dislocation. Degenerative changes of the mandibular condyles, greater on the left. Nasal: Nondisplaced bilateral nasal bone deformities. Nasal Septum: Intact. No significant deviation. Orbits: Globes are intact. Bilateral lens replacement. The extraocular muscles and optic nerve sheath complexes are unremarkable. Normal appearance of the intraorbital fat. Sinuses: Mild mucosal thickening in the ethmoid sinuses. No air-fluid levels. Soft tissues: Mild soft tissue swelling over the bilateral nasal bones. CT CERVICAL SPINE FINDINGS Alignment: Straightening and slight reversal of the normal cervical lordosis. No significant listhesis. No facet subluxation or dislocation. Skull base and vertebrae: No acute fracture. No primary bone lesion or focal pathologic process. Soft tissues and spinal canal: No prevertebral fluid or swelling. No visible canal hematoma. Disc levels: Intervertebral disc space narrowing greatest at C4-5 and C5-6. Small disc bulges at multiple levels. Disc osteophyte complex at C5-6 eccentric to the right resulting in mild spinal canal stenosis. Bilateral facet arthrosis and uncovertebral hypertrophy at multiple levels. There is foraminal narrowing most pronounced on the right at C2-3 and on the left at C3-4 and bilaterally at C5-6. Upper chest: Biapical pleuroparenchymal  scarring. Other: None. IMPRESSION: No CT evidence of acute intracranial abnormality. No acute fracture or traumatic malalignment of the cervical spine. Nondisplaced bilateral nasal bone fractures with overlying soft tissue swelling. Similar appearance of ventriculomegaly. Chronic and degenerative changes as above. Electronically Signed   By: Donnice Mania M.D.   On: 04/02/2024 16:54   CT Cervical Spine Wo Contrast Result Date: 04/02/2024 CLINICAL DATA:  Trauma, fall in grocery store.  Abrasion on nose. EXAM: CT HEAD WITHOUT CONTRAST CT MAXILLOFACIAL WITHOUT CONTRAST CT CERVICAL SPINE WITHOUT CONTRAST TECHNIQUE: Multidetector CT imaging of the head, cervical spine, and maxillofacial structures were performed using  the standard protocol without intravenous contrast. Multiplanar CT image reconstructions of the cervical spine and maxillofacial structures were also generated. RADIATION DOSE REDUCTION: This exam was performed according to the departmental dose-optimization program which includes automated exposure control, adjustment of the mA and/or kV according to patient size and/or use of iterative reconstruction technique. COMPARISON:  CT head and maxillofacial 07/14/2022. MRI cervical spine 03/01/2020. FINDINGS: CT HEAD FINDINGS Brain: No acute intracranial hemorrhage. No CT evidence of completed large territory infarct. Nonspecific hypoattenuation in the periventricular and subcortical white matter favored to reflect chronic microvascular ischemic changes. Prominent perivascular space versus remote lacunar infarct in the inferior aspect of the left lentiform nucleus. No edema, mass effect, or midline shift. The basilar cisterns are patent. Retrocerebellar cystic focus suggestive of mega cisterna magna versus arachnoid cyst. Ventricles: Similar appearance of ventriculomegaly. Borderline callosum angle measuring approximately 95 degrees. Evans index measures approximately 0.4. Vascular: Atherosclerotic  calcifications of the carotid siphons. No hyperdense vessel. Skull: No acute or aggressive finding. Other: Trace fluid in the right mastoid air cells. CT MAXILLOFACIAL FINDINGS Osseous: Maxilla: Intact. Nearly edentulous maxilla with associated chronic bone loss. Pterygoid Plates: Intact Zygomatic Arch: Intact Orbits: Intact Ethmoid: Intact Sphenoid: Intact Frontal:Intact Mandible: Intact. Nearly edentulous mandible with associated chronic bone loss. No condylar dislocation. Degenerative changes of the mandibular condyles, greater on the left. Nasal: Nondisplaced bilateral nasal bone deformities. Nasal Septum: Intact. No significant deviation. Orbits: Globes are intact. Bilateral lens replacement. The extraocular muscles and optic nerve sheath complexes are unremarkable. Normal appearance of the intraorbital fat. Sinuses: Mild mucosal thickening in the ethmoid sinuses. No air-fluid levels. Soft tissues: Mild soft tissue swelling over the bilateral nasal bones. CT CERVICAL SPINE FINDINGS Alignment: Straightening and slight reversal of the normal cervical lordosis. No significant listhesis. No facet subluxation or dislocation. Skull base and vertebrae: No acute fracture. No primary bone lesion or focal pathologic process. Soft tissues and spinal canal: No prevertebral fluid or swelling. No visible canal hematoma. Disc levels: Intervertebral disc space narrowing greatest at C4-5 and C5-6. Small disc bulges at multiple levels. Disc osteophyte complex at C5-6 eccentric to the right resulting in mild spinal canal stenosis. Bilateral facet arthrosis and uncovertebral hypertrophy at multiple levels. There is foraminal narrowing most pronounced on the right at C2-3 and on the left at C3-4 and bilaterally at C5-6. Upper chest: Biapical pleuroparenchymal scarring. Other: None. IMPRESSION: No CT evidence of acute intracranial abnormality. No acute fracture or traumatic malalignment of the cervical spine. Nondisplaced bilateral  nasal bone fractures with overlying soft tissue swelling. Similar appearance of ventriculomegaly. Chronic and degenerative changes as above. Electronically Signed   By: Donnice Mania M.D.   On: 04/02/2024 16:54   CT Maxillofacial Wo Contrast Result Date: 04/02/2024 CLINICAL DATA:  Trauma, fall in grocery store.  Abrasion on nose. EXAM: CT HEAD WITHOUT CONTRAST CT MAXILLOFACIAL WITHOUT CONTRAST CT CERVICAL SPINE WITHOUT CONTRAST TECHNIQUE: Multidetector CT imaging of the head, cervical spine, and maxillofacial structures were performed using the standard protocol without intravenous contrast. Multiplanar CT image reconstructions of the cervical spine and maxillofacial structures were also generated. RADIATION DOSE REDUCTION: This exam was performed according to the departmental dose-optimization program which includes automated exposure control, adjustment of the mA and/or kV according to patient size and/or use of iterative reconstruction technique. COMPARISON:  CT head and maxillofacial 07/14/2022. MRI cervical spine 03/01/2020. FINDINGS: CT HEAD FINDINGS Brain: No acute intracranial hemorrhage. No CT evidence of completed large territory infarct. Nonspecific hypoattenuation in the periventricular and subcortical white  matter favored to reflect chronic microvascular ischemic changes. Prominent perivascular space versus remote lacunar infarct in the inferior aspect of the left lentiform nucleus. No edema, mass effect, or midline shift. The basilar cisterns are patent. Retrocerebellar cystic focus suggestive of mega cisterna magna versus arachnoid cyst. Ventricles: Similar appearance of ventriculomegaly. Borderline callosum angle measuring approximately 95 degrees. Evans index measures approximately 0.4. Vascular: Atherosclerotic calcifications of the carotid siphons. No hyperdense vessel. Skull: No acute or aggressive finding. Other: Trace fluid in the right mastoid air cells. CT MAXILLOFACIAL FINDINGS Osseous:  Maxilla: Intact. Nearly edentulous maxilla with associated chronic bone loss. Pterygoid Plates: Intact Zygomatic Arch: Intact Orbits: Intact Ethmoid: Intact Sphenoid: Intact Frontal:Intact Mandible: Intact. Nearly edentulous mandible with associated chronic bone loss. No condylar dislocation. Degenerative changes of the mandibular condyles, greater on the left. Nasal: Nondisplaced bilateral nasal bone deformities. Nasal Septum: Intact. No significant deviation. Orbits: Globes are intact. Bilateral lens replacement. The extraocular muscles and optic nerve sheath complexes are unremarkable. Normal appearance of the intraorbital fat. Sinuses: Mild mucosal thickening in the ethmoid sinuses. No air-fluid levels. Soft tissues: Mild soft tissue swelling over the bilateral nasal bones. CT CERVICAL SPINE FINDINGS Alignment: Straightening and slight reversal of the normal cervical lordosis. No significant listhesis. No facet subluxation or dislocation. Skull base and vertebrae: No acute fracture. No primary bone lesion or focal pathologic process. Soft tissues and spinal canal: No prevertebral fluid or swelling. No visible canal hematoma. Disc levels: Intervertebral disc space narrowing greatest at C4-5 and C5-6. Small disc bulges at multiple levels. Disc osteophyte complex at C5-6 eccentric to the right resulting in mild spinal canal stenosis. Bilateral facet arthrosis and uncovertebral hypertrophy at multiple levels. There is foraminal narrowing most pronounced on the right at C2-3 and on the left at C3-4 and bilaterally at C5-6. Upper chest: Biapical pleuroparenchymal scarring. Other: None. IMPRESSION: No CT evidence of acute intracranial abnormality. No acute fracture or traumatic malalignment of the cervical spine. Nondisplaced bilateral nasal bone fractures with overlying soft tissue swelling. Similar appearance of ventriculomegaly. Chronic and degenerative changes as above. Electronically Signed   By: Donnice Mania  M.D.   On: 04/02/2024 16:54   DG Shoulder Right Result Date: 04/02/2024 CLINICAL DATA:  Right shoulder and upper arm pain following a fall. EXAM: RIGHT SHOULDER - 2+ VIEW COMPARISON:  None Available. FINDINGS: There is no evidence of fracture or dislocation. There is no evidence of arthropathy or other focal bone abnormality. Soft tissues are unremarkable. IMPRESSION: Negative. Electronically Signed   By: Elspeth Bathe M.D.   On: 04/02/2024 16:41    DG Lumbar Spine Complete Result Date: 09/06/2024 EXAM: 4 or more VIEW(S) XRAY OF THE LUMBAR SPINE 09/06/2024 09:51:25 AM COMPARISON: Right hip and pelvis series, 09/06/2024. CT abdomen and pelvis, 07/29/2023. CLINICAL HISTORY: 68 year old male with pain after a fall. FINDINGS: LUMBAR SPINE: BONES: Normal lumbar segmentation. Stable lumbar vertebral height and alignment. Chronic osteopenia. DISCS AND DEGENERATIVE CHANGES: Stable disc spaces. Mild for age lumbar degenerative endplate spurring. SOFT TISSUES: Calcified abdominal aortic atherosclerosis. Right hip and pelvis postoperative findings are reported separately today. Nonobstructed bowel gas pattern. IMPRESSION: 1. No acute osseous abnormality identified in the lumbar spine. 2. Pelvis reported separately today. Electronically signed by: Helayne Hurst MD 09/06/2024 10:06 AM EST RP Workstation: [id number]   DG Hip Unilat W OR W/O Pelvis 2-3 Views Right Result Date: 09/06/2024 EXAM: 2 or 3 VIEW(S) XRAY OF THE RIGHT HIP 09/06/2024 09:51:25 AM COMPARISON: Right hip series 03/26/2023. CLINICAL HISTORY: 68 year old  male. Pain after a fall. FINDINGS: BONES AND JOINTS: Chronic right total hip arthroplasty superimposed on previous right superior pubic ramus open reduction internal fixation (ORIF). Chronic bilateral pubic rami fractures. Previous sacral open reduction internal fixation (ORIF). Interval placement of cerclage wires along the proximal femur corresponding to areas of periprosthetic fracture last year.  Normal hip arthroplasty alignment. No acute osseous abnormality. SOFT TISSUES: Calcified peripheral vascular disease. Nonobstructed bowel gas pattern. IMPRESSION: 1. No acute osseous abnormality identified about the right hip and pelvis. 2. Chronic pelvic, proximal right femur, and sacral fractures status post ORIF with no adverse features. Electronically signed by: Helayne Hurst MD 09/06/2024 10:04 AM EST RP Workstation: [id number]     Assessment & Plan:  Fall, initial encounter -     DG HIP UNILAT W OR W/O PELVIS 2-3 VIEWS RIGHT; Future -     DG Lumbar Spine Complete; Future  Pain of right hip -     DG HIP UNILAT W OR W/O PELVIS 2-3 VIEWS RIGHT; Future  Acute right-sided low back pain without sciatica -     DG Lumbar Spine Complete; Future  Panlobular emphysema (HCC)- No complications.  Mucopurulent chronic bronchitis (HCC)- Stable.  PAF (paroxysmal atrial fibrillation) (HCC)- He has good R/R control.     Follow-up: Return in about 3 months (around 12/05/2024).  Debby Molt, MD

## 2024-09-06 NOTE — Patient Instructions (Signed)
 Managing Chronic Back Pain Chronic back pain is pain that lasts longer than 3 months. It often affects the lower back. It may feel like a muscle ache or a sharp, stabbing pain. It can be mild, moderate, or severe. There are things you can do to help manage your pain. See what works best for you. Your health care provider may also give you other instructions. What actions can I take to manage my chronic back pain? You may be given a treatment plan by your provider. Treatment often starts with rest and pain relief. It may also include: Physical therapy. These are exercises to help restore movement and strength to your back. Techniques to help you relax. Counseling or therapy. Cognitive behavioral therapy (CBT) is a form of therapy that helps you set goals and make changes. Acupuncture or massage therapy. Local electrical stimulation. Injections. You may be given medicines to numb an area or relieve pain. If other treatments do not help, you may need surgery. How to use body mechanics and posture to help with pain You can help relieve stress on your back with good posture and healthy body mechanics. Body mechanics are all the ways your body moves during the day. Posture is part of body mechanics. Good posture means: Your spine is in its correct S-curve, or neutral, position. Your shoulders are pulled back a bit. Your head is not tipped forward. To improve your posture and body mechanics, follow these guidelines. Standing  When standing, keep your feet about hip-width apart. Keep your knees slightly bent. Your ears, shoulders, and hips should line up. Your spine should be neutral. When you stand in one place for a long time, place one foot on a stable object that is 2-4 inches (5-10 cm) high, such as a footstool. Sitting  When sitting, keep your feet flat on the floor. Use a footrest, if needed. Keep your thighs parallel to the floor. Try not to round your shoulders or tilt your head  forward. When working at a desk or a computer: Position your desk so your hands are a little lower than your elbows. Slide your chair under your desk so you are close enough to have good posture. Position your monitor so you are looking straight ahead and do not have to tilt your head to view the screen. Lifting  Keep your feet shoulder-width apart. Tighten the muscles of your abdomen. Bend your knees and hips. Keep your spine neutral. Lift using the strength of your legs, not your back. Do not lock your knees straight out. Ask for help to lift heavy or awkward objects. Resting  Do not lie down in a way that causes pain. If you have pain when you sit, bend, stoop, or squat, lie in a way that your body does not bend much. Try not to curl up on your side with your arms and knees near your chest (fetal position). If it hurts to stand for a long time or reach with your arms, lie with your spine neutral and knees bent slightly. Try lying: On your side with a pillow between your knees. On your back with a pillow under your knees. How to recognize changes in your chronic back pain Let your provider know if your pain gets worse or does not get better with treatment. Your back pain may be getting worse if you have pain that: Starts to cause problems with your posture. Gets worse when you sit, stand, walk, bend, or lift things. Happens when you are active,  at rest, or both. Makes it hard for you to move around (limits mobility). Occurs with fever, weight loss, or trouble peeing (urinating). Causes numbness and tingling. Follow these instructions at home: Medicines You may need to take medicines for pain and inflammation. These may be taken by mouth or put on the skin. You may also be given muscle relaxants. Take over-the-counter and prescription medicines only as told by your provider. Ask your provider if the medicine prescribed to you: Requires you to avoid driving or using machinery. Can  cause constipation. You may need to take these actions to prevent or treat constipation: Drink enough fluid to keep your pee (urine) pale yellow. Take over-the-counter or prescription medicines. Eat foods that are high in fiber, such as beans, whole grains, and fresh fruits and vegetables. Limit foods that are high in fat and processed sugars, such as fried or sweet foods. Lifestyle Do not use any products that contain nicotine or tobacco. These products include cigarettes, chewing tobacco, and vaping devices, such as e-cigarettes. If you need help quitting, ask your provider. Eat a healthy diet. Eat lots of vegetables, fruits, fish, and lean meats. Work with your provider to stay at a healthy weight. General instructions Get regular exercise as told. Exercise can help with flexibility and strength. If physical therapy was prescribed, do exercises as told by your provider. Use ice or heat therapy as told by your provider. Where can I get support? Think about joining a support group for people with chronic back pain. You can find some groups at: Pain Connection Program: painconnection.org The [employer] Association: acpanow.com Contact a health care provider if: Your pain does not get better with rest or medicine. You have new pain. You have a fever. You lose weight quickly. You have trouble doing your normal activities. You feel weak or numb in one or both of your legs or feet. Get help right away if: You are not able to control when you pee or poop. You have severe back pain and: Nausea or vomiting. Pain in your chest or abdomen. Shortness of breath. You faint. These symptoms may be an emergency. Get help right away. Call 911. Do not wait to see if the symptoms will go away. Do not drive yourself to the hospital. This information is not intended to replace advice given to you by your health care provider. Make sure you discuss any questions you have with your health care  provider. Document Revised: 04/29/2022 Document Reviewed: 04/29/2022 [employer] Patient Education  2024 [employer].

## 2024-09-15 ENCOUNTER — Other Ambulatory Visit: Payer: Self-pay

## 2024-09-27 ENCOUNTER — Ambulatory Visit

## 2024-09-29 ENCOUNTER — Ambulatory Visit (INDEPENDENT_AMBULATORY_CARE_PROVIDER_SITE_OTHER)

## 2024-09-29 DIAGNOSIS — E538 Deficiency of other specified B group vitamins: Secondary | ICD-10-CM

## 2024-09-29 MED ORDER — CYANOCOBALAMIN 1000 MCG/ML IJ SOLN
1000.0000 ug | Freq: Once | INTRAMUSCULAR | Status: AC
  Administered 2024-09-29: 1000 ug via INTRAMUSCULAR

## 2024-09-29 NOTE — Progress Notes (Signed)
 After obtaining consent, and per orders of Dr. Joshua, injection of B12 given by Ronnald SHAUNNA Palms. Patient instructed to report any adverse reaction to me immediately.

## 2024-11-03 ENCOUNTER — Ambulatory Visit

## 2025-02-23 ENCOUNTER — Ambulatory Visit
